# Patient Record
Sex: Male | Born: 1935
Health system: Southern US, Community
[De-identification: ages and names within clinical notes are randomized; demographics above are authoritative.]

## PROBLEM LIST (undated history)

## (undated) DIAGNOSIS — I4891 Unspecified atrial fibrillation: Secondary | ICD-10-CM

## (undated) DIAGNOSIS — I2510 Atherosclerotic heart disease of native coronary artery without angina pectoris: Secondary | ICD-10-CM

## (undated) DIAGNOSIS — I214 Non-ST elevation (NSTEMI) myocardial infarction: Secondary | ICD-10-CM

## (undated) DIAGNOSIS — I639 Cerebral infarction, unspecified: Secondary | ICD-10-CM

## (undated) DIAGNOSIS — E119 Type 2 diabetes mellitus without complications: Secondary | ICD-10-CM

## (undated) DIAGNOSIS — I10 Essential (primary) hypertension: Secondary | ICD-10-CM

## (undated) HISTORY — PX: CARDIAC SURGERY: SHX584

## (undated) HISTORY — PX: ROTATOR CUFF REPAIR: SHX139

---

## 2017-07-29 ENCOUNTER — Encounter (HOSPITAL_COMMUNITY): Payer: Self-pay | Admitting: Emergency Medicine

## 2017-07-29 ENCOUNTER — Ambulatory Visit (INDEPENDENT_AMBULATORY_CARE_PROVIDER_SITE_OTHER): Payer: Medicare PPO

## 2017-07-29 ENCOUNTER — Ambulatory Visit (HOSPITAL_COMMUNITY)
Admission: EM | Admit: 2017-07-29 | Discharge: 2017-07-29 | Disposition: A | Discharge status: Home / Self Care | Payer: Medicare PPO | Attending: Family Medicine | Admitting: Family Medicine

## 2017-07-29 DIAGNOSIS — R0789 Other chest pain: Secondary | ICD-10-CM | POA: Diagnosis not present

## 2017-07-29 DIAGNOSIS — R0781 Pleurodynia: Secondary | ICD-10-CM

## 2017-07-29 MED ORDER — IBUPROFEN 800 MG PO TABS
800.0000 mg | ORAL_TABLET | Freq: Once | ORAL | Status: AC
  Administered 2017-07-29: 800 mg via ORAL

## 2017-07-29 MED ORDER — NAPROXEN 375 MG PO TABS: 375.0000 mg | ORAL_TABLET | Freq: Two times a day (BID) | ORAL | 0 refills | Status: DC

## 2017-07-29 MED ORDER — IBUPROFEN 800 MG PO TABS
ORAL_TABLET | ORAL | Status: AC
  Filled 2017-07-29: qty 1

## 2017-07-29 NOTE — ED Provider Notes (Signed)
[facility]    CSN: [id number] Arrival date & time: 07/29/17  1235     History   Chief Complaint Chief Complaint  Patient presents with  . Flank Pain    HPI Conception ChancyRoscoe Bilotti is a 83 y.o. male.   Beckem presents with complaints of left lateral rib pain which started a few days ago. Worse with movement, laying down, or touching. Pain is constant but is exacerbated. Without shortness of breath, nausea, vomiting, urinary symptoms, fevers, injury, chest pain, pain to arm or jaw. Slight cough, non productive. Eating and drinking. States had similar episode 1 month ago and pain resolved spontaneously. States had a cardiac workup in the past but denies any other known medical history. Does not have a PCP. Has not taken any medications for symptoms, does not take any medications daily. Rates pain 10/10.    ROS per HPI.       History reviewed. No pertinent past medical history.  There are no active problems to display for this patient.   Past Surgical History:  Procedure Laterality Date  . CARDIAC SURGERY         Home Medications    Prior to Admission medications   Medication Sig Start Date End Date Taking? Authorizing Provider  naproxen (NAPROSYN) 375 MG tablet Take 1 tablet (375 mg total) by mouth 2 (two) times daily. 07/29/17   Georgetta HaberBurky, Poetry Cerro B, NP    Family History No family history on file.  Social History Social History   Tobacco Use  . Smoking status: Never Smoker  . Smokeless tobacco: Never Used  Substance Use Topics  . Alcohol use: No    Frequency: Never  . Drug use: Not on file     Allergies   Iodine   Review of Systems Review of Systems   Physical Exam Triage Vital Signs ED Triage Vitals [07/29/17 1424]  Enc Vitals Group     BP (!) 115/58     Pulse Rate 83     Resp 16     Temp 98.4 F (36.9 C)     Temp src      SpO2 100 %     Weight 260 lb (117.9 kg)     Height      Head Circumference      Peak Flow      Pain Score 10    Pain Loc      Pain Edu?      Excl. in GC?    No data found.  Updated Vital Signs BP (!) 115/58   Pulse 83   Temp 98.4 F (36.9 C)   Resp 16   Wt 260 lb (117.9 kg)   SpO2 100%   Visual Acuity Right Eye Distance:   Left Eye Distance:   Bilateral Distance:    Right Eye Near:   Left Eye Near:    Bilateral Near:     Physical Exam  Constitutional: He is oriented to person, place, and time. He appears well-developed and well-nourished.  Cardiovascular: Normal rate and regular rhythm.  Pulmonary/Chest: Effort normal and breath sounds normal. He exhibits tenderness. He exhibits no crepitus.  Left lateral and distal rib tenderness on palpation; non tender to abdomen, chest or back   Abdominal:    Neurological: He is alert and oriented to person, place, and time.  Skin: Skin is warm and dry.     UC Treatments / Results  Labs (all labs ordered are listed, but only abnormal results are displayed)  Labs Reviewed - No data to display  EKG  EKG Interpretation None       Radiology Dg Ribs Unilateral W/chest Left  Result Date: 07/29/2017 CLINICAL DATA:  Left lateral and distal rib pain for 3 days. No known injury. EXAM: LEFT RIBS AND CHEST - 3+ VIEW COMPARISON:  None. FINDINGS: No fracture or other bone lesions are seen involving the ribs. There is no evidence of pneumothorax or pleural effusion. Both lungs are clear. Heart size and mediastinal contours are within normal limits. IMPRESSION: Negative. Electronically Signed   By: Marnee Spring M.D.   On: 07/29/2017 14:57    Procedures Procedures (including critical care time)  Medications Ordered in UC Medications - No data to display   Initial Impression / Assessment and Plan / UC Course  I have reviewed the triage vital signs and the nursing notes.  Pertinent labs & imaging results that were available during my care of the patient were reviewed by me and considered in my medical decision making (see chart for  details).     Pain is specific and reproducible with direct palpation. Negative xray. Without any other symptoms of ACS at this time. Without any known cardiac risk factors. Non toxic in appearance. Vitals stable. Naproxen bid recommended. Ice application. Return precautions provided and discussed at length. Recommended establish with a pcp. Patient verbalized understanding and agreeable to plan.    Final Clinical Impressions(s) / UC Diagnoses   Final diagnoses:  Rib pain on left side    ED Discharge Orders        Ordered    naproxen (NAPROSYN) 375 MG tablet  2 times daily     07/29/17 1512       Controlled Substance Prescriptions [place] Controlled Substance Registry consulted? Not Applicable   Georgetta Haber, NP 07/29/17 1519    Georgetta Haber, NP 07/29/17 1520

## 2017-07-29 NOTE — ED Notes (Signed)
Per pt his daughter will be driving

## 2017-07-29 NOTE — Discharge Instructions (Signed)
Your xray is negative for broken ribs or pneumonia. Your pain is reproducible with touching and moving, likely musculoskeletal in nature. Will try antiinflammatories for pain control.  Take naproxen, twice a day with food. If you develop increased pain, palpitations, shortness of breath, nausea, pain to jaw or arm, or otherwise worsening please return to be seen or go to Er. Please establish with a primary care provider for a recheck and for annual physical.

## 2017-07-29 NOTE — ED Triage Notes (Signed)
PT reports left side pain. PT gestures to LUQ / left rib area. PT denies pain in chest. PT reports pain is sharp and shooting in quality. Denies urinary symptoms. No known injury.   Reports recent cough.

## 2019-06-11 DIAGNOSIS — M51369 Other intervertebral disc degeneration, lumbar region without mention of lumbar back pain or lower extremity pain: Secondary | ICD-10-CM | POA: Insufficient documentation

## 2019-09-18 DIAGNOSIS — Z550 Illiteracy and low-level literacy: Secondary | ICD-10-CM | POA: Insufficient documentation

## 2019-09-18 DIAGNOSIS — G8929 Other chronic pain: Secondary | ICD-10-CM | POA: Insufficient documentation

## 2019-09-18 DIAGNOSIS — H9190 Unspecified hearing loss, unspecified ear: Secondary | ICD-10-CM | POA: Insufficient documentation

## 2020-03-11 ENCOUNTER — Observation Stay (HOSPITAL_COMMUNITY)
Admission: EM | Admit: 2020-03-11 | Discharge: 2020-03-12 | Disposition: A | Discharge status: Home / Self Care | Payer: Medicare PPO | Attending: Family Medicine | Admitting: Family Medicine

## 2020-03-11 ENCOUNTER — Emergency Department (HOSPITAL_COMMUNITY): Payer: Medicare PPO

## 2020-03-11 ENCOUNTER — Other Ambulatory Visit: Payer: Self-pay

## 2020-03-11 ENCOUNTER — Encounter (HOSPITAL_COMMUNITY): Payer: Self-pay | Admitting: *Deleted

## 2020-03-11 DIAGNOSIS — I482 Chronic atrial fibrillation, unspecified: Secondary | ICD-10-CM | POA: Diagnosis present

## 2020-03-11 DIAGNOSIS — G8929 Other chronic pain: Secondary | ICD-10-CM | POA: Diagnosis not present

## 2020-03-11 DIAGNOSIS — I4892 Unspecified atrial flutter: Secondary | ICD-10-CM | POA: Diagnosis not present

## 2020-03-11 DIAGNOSIS — M545 Low back pain: Secondary | ICD-10-CM | POA: Diagnosis not present

## 2020-03-11 DIAGNOSIS — Z20822 Contact with and (suspected) exposure to covid-19: Secondary | ICD-10-CM | POA: Insufficient documentation

## 2020-03-11 DIAGNOSIS — R0600 Dyspnea, unspecified: Secondary | ICD-10-CM | POA: Insufficient documentation

## 2020-03-11 DIAGNOSIS — R0609 Other forms of dyspnea: Secondary | ICD-10-CM

## 2020-03-11 DIAGNOSIS — I4891 Unspecified atrial fibrillation: Principal | ICD-10-CM | POA: Insufficient documentation

## 2020-03-11 DIAGNOSIS — I2510 Atherosclerotic heart disease of native coronary artery without angina pectoris: Secondary | ICD-10-CM | POA: Diagnosis not present

## 2020-03-11 DIAGNOSIS — Z7982 Long term (current) use of aspirin: Secondary | ICD-10-CM | POA: Diagnosis not present

## 2020-03-11 DIAGNOSIS — D649 Anemia, unspecified: Secondary | ICD-10-CM | POA: Insufficient documentation

## 2020-03-11 DIAGNOSIS — I081 Rheumatic disorders of both mitral and tricuspid valves: Secondary | ICD-10-CM | POA: Diagnosis not present

## 2020-03-11 DIAGNOSIS — R079 Chest pain, unspecified: Secondary | ICD-10-CM | POA: Diagnosis present

## 2020-03-11 DIAGNOSIS — Z79899 Other long term (current) drug therapy: Secondary | ICD-10-CM | POA: Diagnosis not present

## 2020-03-11 HISTORY — DX: Atherosclerotic heart disease of native coronary artery without angina pectoris: I25.10

## 2020-03-11 HISTORY — DX: Unspecified atrial fibrillation: I48.91

## 2020-03-11 LAB — CBC
HCT: 27.9 % — ABNORMAL LOW (ref 39.0–52.0)
Hemoglobin: 8.2 g/dL — ABNORMAL LOW (ref 13.0–17.0)
MCH: 22.1 pg — ABNORMAL LOW (ref 26.0–34.0)
MCHC: 29.4 g/dL — ABNORMAL LOW (ref 30.0–36.0)
MCV: 75.2 fL — ABNORMAL LOW (ref 80.0–100.0)
Platelets: 245 10*3/uL (ref 150–400)
RBC: 3.71 MIL/uL — ABNORMAL LOW (ref 4.22–5.81)
RDW: 19.9 % — ABNORMAL HIGH (ref 11.5–15.5)
WBC: 3.3 10*3/uL — ABNORMAL LOW (ref 4.0–10.5)
nRBC: 0 % (ref 0.0–0.2)

## 2020-03-11 LAB — TROPONIN I (HIGH SENSITIVITY)
Troponin I (High Sensitivity): 77 ng/L — ABNORMAL HIGH (ref <18)
Troponin I (High Sensitivity): 74 ng/L — ABNORMAL HIGH (ref <18)

## 2020-03-11 LAB — PROTIME-INR
INR: 1.1 (ref 0.8–1.2)
Prothrombin Time: 13.4 seconds (ref 11.4–15.2)

## 2020-03-11 LAB — POC OCCULT BLOOD, ED: Fecal Occult Bld: NEGATIVE

## 2020-03-11 LAB — BASIC METABOLIC PANEL
Anion gap: 11 (ref 5–15)
BUN: 8 mg/dL (ref 8–23)
CO2: 24 mmol/L (ref 22–32)
Calcium: 8.6 mg/dL — ABNORMAL LOW (ref 8.9–10.3)
Chloride: 101 mmol/L (ref 98–111)
Creatinine, Ser: 1.04 mg/dL (ref 0.61–1.24)
GFR calc Af Amer: >60 mL/min (ref >60)
GFR calc non Af Amer: >60 mL/min (ref >60)
Glucose, Bld: 126 mg/dL — ABNORMAL HIGH (ref 70–99)
Potassium: 4.3 mmol/L (ref 3.5–5.1)
Sodium: 136 mmol/L (ref 135–145)

## 2020-03-11 LAB — MAGNESIUM: Magnesium: 1.9 mg/dL (ref 1.7–2.4)

## 2020-03-11 LAB — APTT: aPTT: 31 seconds (ref 24–36)

## 2020-03-11 LAB — BRAIN NATRIURETIC PEPTIDE: B Natriuretic Peptide: 155 pg/mL — ABNORMAL HIGH (ref 0.0–100.0)

## 2020-03-11 LAB — ABO/RH: ABO/RH(D): O POS

## 2020-03-11 LAB — PREPARE RBC (CROSSMATCH)

## 2020-03-11 LAB — SARS CORONAVIRUS 2 BY RT PCR (HOSPITAL ORDER, PERFORMED IN ~~LOC~~ HOSPITAL LAB): SARS Coronavirus 2: NEGATIVE

## 2020-03-11 MED ORDER — SODIUM CHLORIDE 0.9% FLUSH
3.0000 mL | Freq: Two times a day (BID) | INTRAVENOUS | Status: DC
  Administered 2020-03-11 – 2020-03-12 (×2): 3 mL via INTRAVENOUS

## 2020-03-11 MED ORDER — DILTIAZEM HCL 25 MG/5ML IV SOLN
10.0000 mg | Freq: Once | INTRAVENOUS | Status: AC
  Administered 2020-03-11: 10 mg via INTRAVENOUS
  Filled 2020-03-11: qty 5

## 2020-03-11 MED ORDER — IOHEXOL 350 MG/ML SOLN
100.0000 mL | Freq: Once | INTRAVENOUS | Status: AC | PRN
  Administered 2020-03-11: 100 mL via INTRAVENOUS

## 2020-03-11 MED ORDER — ASPIRIN EC 81 MG PO TBEC
81.0000 mg | DELAYED_RELEASE_TABLET | Freq: Every day | ORAL | Status: DC
  Administered 2020-03-12: 81 mg via ORAL
  Filled 2020-03-11: qty 1

## 2020-03-11 MED ORDER — SODIUM CHLORIDE 0.9 % IV SOLN: 250.0000 mL | INTRAVENOUS | Status: DC | PRN

## 2020-03-11 MED ORDER — METOPROLOL TARTRATE 25 MG PO TABS
25.0000 mg | ORAL_TABLET | Freq: Two times a day (BID) | ORAL | Status: DC
  Administered 2020-03-12 (×2): 25 mg via ORAL
  Filled 2020-03-11 (×2): qty 1

## 2020-03-11 MED ORDER — SODIUM CHLORIDE 0.9% IV SOLUTION: Freq: Once | INTRAVENOUS | Status: DC

## 2020-03-11 MED ORDER — ACETAMINOPHEN 325 MG PO TABS
650.0000 mg | ORAL_TABLET | ORAL | Status: DC | PRN
  Administered 2020-03-12: 650 mg via ORAL
  Filled 2020-03-11: qty 2

## 2020-03-11 MED ORDER — FAMOTIDINE 20 MG PO TABS
20.0000 mg | ORAL_TABLET | Freq: Every day | ORAL | Status: DC
  Administered 2020-03-11 – 2020-03-12 (×2): 20 mg via ORAL
  Filled 2020-03-11 (×2): qty 1

## 2020-03-11 MED ORDER — ENOXAPARIN SODIUM 40 MG/0.4ML ~~LOC~~ SOLN: 40.0000 mg | SUBCUTANEOUS | Status: DC

## 2020-03-11 MED ORDER — DILTIAZEM LOAD VIA INFUSION
20.0000 mg | Freq: Once | INTRAVENOUS | Status: DC
  Filled 2020-03-11: qty 20

## 2020-03-11 MED ORDER — GABAPENTIN 100 MG PO CAPS
200.0000 mg | ORAL_CAPSULE | Freq: Three times a day (TID) | ORAL | Status: DC
  Administered 2020-03-11 – 2020-03-12 (×2): 200 mg via ORAL
  Filled 2020-03-11 (×3): qty 2

## 2020-03-11 MED ORDER — DILTIAZEM HCL-DEXTROSE 125-5 MG/125ML-% IV SOLN (PREMIX)
5.0000 mg/h | INTRAVENOUS | Status: DC
  Filled 2020-03-11: qty 125

## 2020-03-11 MED ORDER — FUROSEMIDE 10 MG/ML IJ SOLN
40.0000 mg | Freq: Two times a day (BID) | INTRAMUSCULAR | Status: DC
  Administered 2020-03-11: 40 mg via INTRAVENOUS
  Filled 2020-03-11: qty 4

## 2020-03-11 MED ORDER — SODIUM CHLORIDE 0.9% FLUSH: 3.0000 mL | INTRAVENOUS | Status: DC | PRN

## 2020-03-11 NOTE — ED Provider Notes (Signed)
Patient was received as a handoff due to shift change from Irma Newness, PA-C she provided HPI, current work-up and likely disposition please see her note for for full detail.  In short patient presents with worsening dyspnea on exertion with substernal chest pain that started around 6 AM this morning.  Patient has significant medical history of atrial fib not on anticoags and iron deficient anemia.  Work-up reveals microcytic anemia which appears to be at baseline for patient, BMP without electrolyte abnormalities, no signs of AKI, negative Hemoccult, INR 1.1, prothrombin time 13.4, initial troponin 77, second troponin 74, EKG showing atrial flutter.  Covid negative, CT angios negative for PE but does show 4 vessel claudication.  Chest x-ray unremarkable for acute findings.  I suspect patient's increased dyspnea and chest pain secondary is A. fib with RVR patient will need further evaluation.  Pending cardio consult likely patient will be admitted for further observation. Physical Exam  BP (!) 149/84    Pulse 63    Temp 98.6 F (37 C) (Oral)    Resp 14    Ht 5\' 11"  (1.803 m)    Wt 117.9 kg    SpO2 96%    BMI 36.26 kg/m   Physical Exam Vitals and nursing note reviewed.  Constitutional:      General: He is not in acute distress.    Appearance: Normal appearance. He is not ill-appearing or diaphoretic.  HENT:     Head: Normocephalic and atraumatic.     Nose: No congestion or rhinorrhea.  Eyes:     General: No scleral icterus.       Right eye: No discharge.        Left eye: No discharge.     Conjunctiva/sclera: Conjunctivae normal.  Pulmonary:     Effort: Pulmonary effort is normal. No respiratory distress.     Breath sounds: Normal breath sounds. No wheezing.  Musculoskeletal:     Cervical back: Neck supple.     Right lower leg: No edema.     Left lower leg: No edema.  Skin:    General: Skin is warm and dry.     Coloration: Skin is not jaundiced or pale.  Neurological:     Mental  Status: He is alert and oriented to person, place, and time.  Psychiatric:        Mood and Affect: Mood normal.     ED Course/Procedures   Clinical Course as of Mar 12 1599  Thu Mar 11, 2020  1155 Atrial flutter with 2:1 A-V conduction Septal infarct , age undetermined Abnormal ECG  EKG 12-Lead [CG]  1159 Daughter tawanda 336 Mar 13, 2020   [CG]  1208 Hematology known low iron and scheduled to have iron infusions.  PCP sent to to cardiologist for dyspnea had appointment yesterday, they wanted to admit him and patient declined. Cardiologist hooked him on cardiac monitor and sent home to rest. Woke up this morning feeling worsening SOB. Called cardiologist and RN rec come to ER.  Dr 120[facility]. PCP Dr [facility] [place]. Cardiologist prescribed him rate controlling medicine just given yesterday.     [CG]  1226 Mildly prominent interstitial markings which may reflect bronchitic type lung changes versus mild edema.  DG Chest Port 1 View [CG]  1350 Performed hemoccult and HR back up in the 130s.  Decreased to low 100s after some rest. Patient very dyspnic with movement on bed for exam    [CG]  1351 Pending hemoccult   Hemoglobin(!): 8.2 [  CG]  1355 Atrial flutter Posterior infarct, old Non-specific ST-t changes   [CG]  1355 Fecal Occult Blood, POC: NEGATIVE [CG]  1547 Troponin I (High Sensitivity)(!): 74 [CG]    Clinical Course User Index [CG] Liberty Handy, PA-C    Procedures  MDM  I have personally reviewed all imaging, labs and have interpreted them.  Spoke with the on-call cardiologist Dr. Jacques Navy who agrees patient should be admitted to the medicine service for rate control and their team will consult on the patient.  Spoke with Dr. Esmond Harps of the medicine team who has agreed to accept the patient for further evaluation management.  She was provided HPI and current work-up.  Patient appears resting comfortably in bed, showing no acute signs stress, vital signs  remain reassuring.  Patient care will be transferred over to admitting team for further evaluation.      Carroll Sage, PA-C 03/11/20 1656    Gerhard Munch, MD 03/11/20 1729

## 2020-03-11 NOTE — H&P (Addendum)
Family Medicine Teaching [facility] Admission History and Physical Service Pager: [phone]  Patient name: Tristan Gardner Medical record number: [id number] Date of birth: Dec 03, 1934 Age: 85 y.o. Gender: male  Primary Care Provider: Patient, No Pcp Per Consultants: Cardiology Code Status: DNR Preferred Emergency Contact: Tristan Gardner [phone]  Chief Complaint: Chest Pain  Assessment and Plan: Tristan Gardner is a 85 y.o. male presenting with Chest Pain. PMH is significant for PAF, sciatica, chronic low back pain, obesity, iron deficiency anemia  Chest Pain   Atrial Flutter w/ h/o PAF   Dyspnea Patient presents with sharp pain in chest that radiates to right arm. VSS initially notable for HR to 128. Lab work up in ED significant for HS troponin that trended flat (77>75), ekg notable for intermittently a. Flutter and a. Fib, BNP with very mild elevation to 155. Chest X-Ray shows mildly prominent interstitial markings which may reflect bronchietic type lung changes versus mild edema. Due to chest pain and tachycardia, CTA obtained which had no e/o PE. Unclear how long patient has had a.fib/a.flutter, but patient's daughter indicates patient has had PAF for many years and patient has h/o cardiac catheterization. No records currently available as patient received most of his care in [place], [place]. Patient is only on ASA for PAF; however CHAD2S-VASC is at least 2-3 due to age and possible CHF. Unclear if patient has a known diagnosis of CHF, but he does use lasix 20 mg PO qd. Underlying etiology could include a component of CHF given pitting edema and fluid on chest X-Ray.  Patient has no infectious signs or symptoms. Patient has h/o iron deficiency anemia, with (hbg 8.6, MCV 75.2). He reports h/o colonoscopy many years ago, FOBT negative today with no h/o bloody stools or frank blood in urine. Patient referred to cardiology outpatient, who he saw yesterday and started metoprolol for rate control  of a. Flutter and recommended holter vs admission. HR improved to 60-70s with Diltiazem 10 mg bolus x2. Unlikely that ACS is cause of chest pain with flat troponins and ekg without ST changes; most likely related to demand ischemia in setting of a. Flutter with RVR and/or CHF. Cardiology consulted in the ED and recommends giving 1U pRBCs to assess for improvement in symptoms; may require ischemic work up if no improvement. - Admit to FPTS, cardiac telemetry, attending Dr. Deirdre Priest - Cardiology consulted, appreciate recommendations - f/u ECHO results - Continuous cardiac monitoring - Continuous pulse oximetry - AM BMP, CBC, Mag, & Phos - IV lasix 40 mg  - Can bolus with diltiazem as needed - Continue metoprolol  - Up with assistance - Heart healthy diet  Chronic Microcytic Anemia Patient reports long history of anemia and has been taking iron supplementation; received iron infusions in [place], [place] by daughter's report. Patient recently saw hematology at [facility] on 03/01/20, who noted iron deficiency anemia, recommended IV iron infusions and GI referral. Patient's baseline tends to be 9.5-9.6, but in setting of a. Flutter with RVR and significant cardiac history (report of cardiac cath), cardiology recommends transfusion of 1U pRBCs. Patient has h/o colonoscopy years ago, which was reportedly without concern. FOBT negative, no h/o melena, hematochezia. He may require an op GI work up. He also reports smoking 1/2 ppd of tobacco from age 85 to 73, or 17.5 pack-years. He denies frank blood in his urine. Will obtain UA. -Tranfuse 1 unti pRBCs - Monitor for symptomatic improvement - Repeat CBC in morning - UA  Leukopenia Notably patient has leukopenia to  3.3. Last WBC in care everywhere is 4 on 01/28/20. WNL. Platelets are WNL, so unlikely to be myeloproliferative disorder. Possibly due to age bone marrow is underperforming.  - Continue to monitor with CBC  Elevated BP BP elevated to 120-175/67-125  in ED. Most recently 120/81. Unclear baseline, though on care everywhere frequently SBP in 140s. Goal due to age <150/90. Not presently on any home medication.  - Continue to monitor   FEN/GI: Full Diet Prophylaxis: Lovenox  Disposition: cardiac telemetry  History of Present Illness:  Tristan Gardner is a 85 y.o. male presenting with chest pain and SOB. He has had these symptoms worsening over several months, but much worse since Tuesday. He woke up this morning and wasn't feeling well, he also had severe SOB. He was laying in bed when chest pain started, it was 10/10 in severity, stinging in nature and radiated to his right arm. He called his daughter due to that pain and she came over, upon seeing him, she called EMS. Yesterday he saw a cardiologist who recommended he be admitted yesterday after EKG revealed a fib and tachycardia, but patient did not want to be admitted and went home with holter monitor. Chest pain and SOB are better at rest, worse with any exertion, but can intermittently occur at rest. He had an episode of nausea and vomiting around 10 PM last night after eating [employer] fries for dinner. He has only had water today.   Patient denies weakness, runny nose, abdominal pain, dysuria, urinary frequency, constipation, and leg swelling.  Stopped smoking at age 85, started at age 85 yo 1/2 ppd. Does not drink EtoH, no illicit drug use.  Seen by Cardiologist yesterday.  Review Of Systems:   Review of Systems  Constitutional: Positive for activity change. Negative for chills, diaphoresis, fatigue and fever.  HENT: Negative for congestion and rhinorrhea.   Respiratory: Positive for cough, chest tightness and shortness of breath. Negative for wheezing.   Cardiovascular: Positive for chest pain and palpitations.  Gastrointestinal: Positive for nausea and vomiting. Negative for abdominal distention, abdominal pain and blood in stool.  Neurological: Negative for dizziness,  light-headedness, numbness and headaches.  All other systems reviewed and are negative.    There are no problems to display for this patient.   Past Medical History: Past Medical History:  Diagnosis Date   Coronary artery disease     Past Surgical History: Past Surgical History:  Procedure Laterality Date   CARDIAC SURGERY      Social History: Social History   Tobacco Use   Smoking status: Never Smoker   Smokeless tobacco: Never Used  Substance Use Topics   Alcohol use: No   Drug use: Not on file   Additional social history: lives with granddaughter Please also refer to relevant sections of EMR.  Family History: Diabetes Mother  Heart attack Father [id number] Diabetes Brother  Hypertension Brother  Heart disease Brother   Allergies and Medications: Allergies  Allergen Reactions   Iodine Hives    Pt states allergy to shellfish only    No current facility-administered medications on file prior to encounter.   Current Outpatient Medications on File Prior to Encounter  Medication Sig Dispense Refill   acetaminophen (TYLENOL) 500 MG tablet Take 1,000 mg by mouth every 6 (six) hours as needed for mild pain.     aspirin EC 81 MG tablet Take 81 mg by mouth daily. Swallow whole.     famotidine (PEPCID) 20 MG tablet Take 20 mg by  mouth daily.     gabapentin (NEURONTIN) 100 MG capsule Take 200 mg by mouth 3 (three) times daily.     ibuprofen (ADVIL) 200 MG tablet Take 600 mg by mouth every 6 (six) hours as needed for moderate pain.     naproxen (NAPROSYN) 375 MG tablet Take 1 tablet (375 mg total) by mouth 2 (two) times daily. (Patient not taking: Reported on 03/11/2020) 20 tablet 0    Objective: BP (!) 149/84    Pulse 63    Temp 98.6 F (37 C) (Oral)    Resp 14    Ht 5\' 11"  (1.803 m)    Wt 117.9 kg    SpO2 96%    BMI 36.26 kg/m  Exam:  Physical Exam Constitutional:      General: He is not in acute distress.    Appearance: He is well-developed.  HENT:      Head: Normocephalic and atraumatic.  Eyes:     Pupils: Pupils are equal, round, and reactive to light.  Cardiovascular:     Rate and Rhythm: Normal rate. Rhythm irregular.     Pulses:          Radial pulses are 2+ on the right side and 2+ on the left side.       Dorsalis pedis pulses are 2+ on the right side and 2+ on the left side.     Heart sounds: No murmur heard.  No systolic murmur is present.  No gallop.   Pulmonary:     Effort: Pulmonary effort is normal.     Breath sounds: Normal breath sounds.  Abdominal:     General: Bowel sounds are normal.     Palpations: Abdomen is soft.  Musculoskeletal:     Right lower leg: Edema present.     Left lower leg: Edema present.     Comments: 1+ pitting edema up to knees bilaterally  Skin:    General: Skin is warm.     Capillary Refill: Capillary refill takes less than 2 seconds.  Neurological:     Mental Status: He is alert.     Labs and Imaging: CBC BMET  Recent Labs  Lab 03/11/20 1214  WBC 3.3*  HGB 8.2*  HCT 27.9*  PLT 245   Recent Labs  Lab 03/11/20 1214  NA 136  K 4.3  CL 101  CO2 24  BUN 8  CREATININE 1.04  GLUCOSE 126*  CALCIUM 8.6*     EKG: A flutter with significant artifact and baseline wander  CT Angio Chest PE W and/or Wo Contrast  Result Date: 03/11/2020 CLINICAL DATA:  Shortness of breath, history of atrial fibrillation, suspected pulmonary embolus. EXAM: CT ANGIOGRAPHY CHEST WITH CONTRAST TECHNIQUE: Multidetector CT imaging of the chest was performed using the standard protocol during bolus administration of intravenous contrast. Multiplanar CT image reconstructions and MIPs were obtained to evaluate the vascular anatomy. CONTRAST:  05/11/2020 OMNIPAQUE IOHEXOL 350 MG/ML SOLN COMPARISON:  Chest x-ray 03/11/2020 FINDINGS: Cardiovascular: Satisfactory opacification of the pulmonary arteries to the segmental level. Unable to evaluate at the subsegmental level due to timing of intravenous contrast and respiratory  motion artifact. No evidence of pulmonary embolism. Normal heart size. No significant pericardial effusion. The thoracic aorta is normal in caliber. There is mild atherosclerotic plaque of the thoracic aorta. At least mild four-vessel coronary artery calcifications. Mediastinum/Nodes: Several prominent and minimally enlarged calcified and noncalcified mediastinal and right hilar lymph nodes are identified. No left hilar or axillary lymph nodes. Thyroid  gland, trachea, and esophagus demonstrate no significant findings. Lungs/Pleura: Expiratory phase of respiration with bilateral lower lobe subsegmental atelectasis. No focal consolidation. Limited evaluation for pulmonary micronodules due to respiratory motion artifact. Otherwise no pulmonary nodule identified. No pulmonary mass. No pleural effusion. No pneumothorax. Upper Abdomen: No acute abnormality. Musculoskeletal: Bilateral gynecomastia. No suspicious lytic or blastic osseous lesions. No acute displaced fracture. Multilevel moderate degenerative changes of the spine. Review of the MIP images confirms the above findings. IMPRESSION: No central or segmental pulmonary embolus. Limited evaluation of the subsegmental level due to timing of intravenous contrast and motion artifact. No acute intrathoracic abnormality. At least mild four-vessel coronary artery calcifications. Aortic Atherosclerosis (ICD10-I70.0). Electronically Signed   By: Tish Frederickson M.D.   On: 03/11/2020 14:57   DG Chest Port 1 View  Result Date: 03/11/2020 CLINICAL DATA:  Shortness of breath for 4-5 months EXAM: PORTABLE CHEST 1 VIEW COMPARISON:  07/29/2017 FINDINGS: The heart size is upper limits of normal. Atherosclerotic calcification of the aortic knob. Mildly prominent interstitial markings. No focal airspace consolidation, pleural effusion, or pneumothorax. The visualized skeletal structures are unremarkable. IMPRESSION: Mildly prominent interstitial markings which may reflect  bronchitic type lung changes versus mild edema. Electronically Signed   By: Duanne Guess D.O.   On: 03/11/2020 12:12   Jovita Kussmaul, MD 03/11/2020, 4:21 PM PGY-1, [facility] Family Medicine FPTS Intern pager: [phone], text pages welcome  FPTS Upper-Level Resident Addendum   I have independently interviewed and examined the patient. I have discussed the above with the original author and agree with their documentation. My edits for correction/addition/clarification are in pink. Please see also any attending notes.   Shirlean Mylar, M.D. PGY-2, [facility] Family Medicine 03/11/2020 10:19 PM  FPTS Service pager: [phone] (text pages welcome through [facility])

## 2020-03-11 NOTE — ED Provider Notes (Cosign Needed)
[facility] EMERGENCY DEPARTMENT Provider Note   CSN: [id number] Arrival date & time: 03/11/20  1100     History Chief Complaint  Patient presents with  . Chest Pain  . Atrial Fibrillation    Tristan Gardner is a 85 y.o. male with history of atrial fibrillation on aspirin presents to the ER for evaluation of shortness of breath for the last 4 to 5 months.  Exertional.  States walking more than 20 feet will make him really winded and he has to sit down.  This has gradually worsened over the last few months.  Also reports a dry forceful cough for several weeks.  He has chest pain when he coughs.  Has noticed he can lay flat and has to sit up when he tries to sleep.  This morning he went to the bathroom and when he came back to bed felt a "pinch" in the center part of his chest with associated right arm achiness.  This happened at around 6 AM.  He also had shortness of breath at that time.  He denies nausea, vomiting, diaphoresis, palpitations.  He called his daughter who called 911.  He was given nitroglycerin and 4 aspirin and states his chest pain is better but still there as well as his right arm pain.  Patient denies any lung disease, cigarette use.  Denies any history of CAD.  No history of blood clots, recent immobilization, recent surgeries, hormone therapy.  No hemoptysis.  Denies history of heart failure.Tested negative for COVID 2 days ago.  1255: I called patient's daughter who states patient has been reporting shortness of breath and cough for months.  His primary care doctor referred him to cardiology who he saw yesterday.  States cardiologist wanted to admit him but patient declined and sent him home with a cardiac monitor  He was prescribed additional medicines yesterday but he has not taken them, unknown of the names.  Patient is not on any blood thinners.  Daughter states that patient's iron levels have been low and he is scheduled for an iron infusion.  HPI      Past Medical History:  Diagnosis Date  . Coronary artery disease     There are no problems to display for this patient.   Past Surgical History:  Procedure Laterality Date  . CARDIAC SURGERY         No family history on file.  Social History   Tobacco Use  . Smoking status: Never Smoker  . Smokeless tobacco: Never Used  Substance Use Topics  . Alcohol use: No  . Drug use: Not on file    Home Medications Prior to Admission medications   Medication Sig Start Date End Date Taking? Authorizing Provider  acetaminophen (TYLENOL) 500 MG tablet Take 1,000 mg by mouth every 6 (six) hours as needed for mild pain.   Yes [provider]  aspirin EC 81 MG tablet Take 81 mg by mouth daily. Swallow whole.   Yes [provider]  famotidine (PEPCID) 20 MG tablet Take 20 mg by mouth daily.   Yes [provider]  gabapentin (NEURONTIN) 100 MG capsule Take 200 mg by mouth 3 (three) times daily.   Yes [provider]  ibuprofen (ADVIL) 200 MG tablet Take 600 mg by mouth every 6 (six) hours as needed for moderate pain.   Yes [provider]  naproxen (NAPROSYN) 375 MG tablet Take 1 tablet (375 mg total) by mouth 2 (two) times daily. Patient  not taking: Reported on 03/11/2020 07/29/17   Georgetta HaberBurky, Natalie B, NP    Allergies    Iodine  Review of Systems   Review of Systems  Respiratory: Positive for cough and shortness of breath.   Cardiovascular: Positive for chest pain.  Musculoskeletal: Positive for myalgias (right arm).  All other systems reviewed and are negative.   Physical Exam Updated Vital Signs BP (!) 149/84   Pulse 63   Temp 98.6 F (37 C) (Oral)   Resp 14   Ht 5\' 11"  (1.803 m)   Wt 117.9 kg   SpO2 96%   BMI 36.26 kg/m   Physical Exam Vitals and nursing note reviewed.  Constitutional:      General: He is not in acute distress.    Appearance: He is well-developed.     Comments: NAD.  HENT:     Head: Normocephalic and  atraumatic.     Right Ear: External ear normal.     Left Ear: External ear normal.     Nose: Nose normal.  Eyes:     General: No scleral icterus.    Conjunctiva/sclera: Conjunctivae normal.  Cardiovascular:     Rate and Rhythm: Normal rate. Rhythm irregular.     Heart sounds: Normal heart sounds. No murmur heard.      Comments: Irregular rhythm, heart rate in the 90s.  No pitting lower extremity edema.  No calf tenderness.  1+ radial and DP pulses bilaterally. Pulmonary:     Effort: Pulmonary effort is normal.     Breath sounds: Normal breath sounds. No wheezing.     Comments: Normal work of breathing.  No wheezing, crackles, mildly diminished air sounds in lower lobes Genitourinary:    Comments: DRE performed with RN at bedside. Stool is brown.  Musculoskeletal:        General: No deformity. Normal range of motion.     Cervical back: Normal range of motion and neck supple.  Skin:    General: Skin is warm and dry.     Capillary Refill: Capillary refill takes less than 2 seconds.  Neurological:     Mental Status: He is alert and oriented to person, place, and time.  Psychiatric:        Behavior: Behavior normal.        Thought Content: Thought content normal.        Judgment: Judgment normal.     ED Results / Procedures / Treatments   Labs (all labs ordered are listed, but only abnormal results are displayed) Labs Reviewed  BASIC METABOLIC PANEL - Abnormal; Notable for the following components:      Result Value   Glucose, Bld 126 (*)    Calcium 8.6 (*)    All other components within normal limits  CBC - Abnormal; Notable for the following components:   WBC 3.3 (*)    RBC 3.71 (*)    Hemoglobin 8.2 (*)    HCT 27.9 (*)    MCV 75.2 (*)    MCH 22.1 (*)    MCHC 29.4 (*)    RDW 19.9 (*)    All other components within normal limits  BRAIN NATRIURETIC PEPTIDE - Abnormal; Notable for the following components:   B Natriuretic Peptide 155.0 (*)    All other components  within normal limits  TROPONIN I (HIGH SENSITIVITY) - Abnormal; Notable for the following components:   Troponin I (High Sensitivity) 77 (*)    All other components within normal limits  SARS CORONAVIRUS  2 BY RT PCR [facility] ORDER, PERFORMED IN [facility])  APTT  PROTIME-INR  MAGNESIUM  POC OCCULT BLOOD, ED  TROPONIN I (HIGH SENSITIVITY)    EKG EKG Interpretation  Date/Time:  Thursday March 11 2020 11:58:45 EDT Ventricular Rate:  90 PR Interval:    QRS Duration: 100 QT Interval:  389 QTC Calculation: 408 R Axis:   16 Text Interpretation: Atrial flutter Posterior infarct, old Non-specific ST-t changes Confirmed by Raeford Razor [phone]) on 03/11/2020 1:52:07 PM   Radiology CT Angio Chest PE W and/or Wo Contrast  Result Date: 03/11/2020 CLINICAL DATA:  Shortness of breath, history of atrial fibrillation, suspected pulmonary embolus. EXAM: CT ANGIOGRAPHY CHEST WITH CONTRAST TECHNIQUE: Multidetector CT imaging of the chest was performed using the standard protocol during bolus administration of intravenous contrast. Multiplanar CT image reconstructions and MIPs were obtained to evaluate the vascular anatomy. CONTRAST:  OMNIPAQUE IOHEXOL 350 MG/ML SOLN COMPARISON:  Chest x-ray 03/11/2020 FINDINGS: Cardiovascular: Satisfactory opacification of the pulmonary arteries to the segmental level. Unable to evaluate at the subsegmental level due to timing of intravenous contrast and respiratory motion artifact. No evidence of pulmonary embolism. Normal heart size. No significant pericardial effusion. The thoracic aorta is normal in caliber. There is mild atherosclerotic plaque of the thoracic aorta. At least mild four-vessel coronary artery calcifications. Mediastinum/Nodes: Several prominent and minimally enlarged calcified and noncalcified mediastinal and right hilar lymph nodes are identified. No left hilar or axillary lymph nodes. Thyroid gland, trachea, and esophagus  demonstrate no significant findings. Lungs/Pleura: Expiratory phase of respiration with bilateral lower lobe subsegmental atelectasis. No focal consolidation. Limited evaluation for pulmonary micronodules due to respiratory motion artifact. Otherwise no pulmonary nodule identified. No pulmonary mass. No pleural effusion. No pneumothorax. Upper Abdomen: No acute abnormality. Musculoskeletal: Bilateral gynecomastia. No suspicious lytic or blastic osseous lesions. No acute displaced fracture. Multilevel moderate degenerative changes of the spine. Review of the MIP images confirms the above findings. IMPRESSION: No central or segmental pulmonary embolus. Limited evaluation of the subsegmental level due to timing of intravenous contrast and motion artifact. No acute intrathoracic abnormality. At least mild four-vessel coronary artery calcifications. Aortic Atherosclerosis (ICD10-I70.0). Electronically Signed   By: Tish Frederickson M.D.   On: 03/11/2020 14:57   DG Chest Port 1 View  Result Date: 03/11/2020 CLINICAL DATA:  Shortness of breath for 4-5 months EXAM: PORTABLE CHEST 1 VIEW COMPARISON:  07/29/2017 FINDINGS: The heart size is upper limits of normal. Atherosclerotic calcification of the aortic knob. Mildly prominent interstitial markings. No focal airspace consolidation, pleural effusion, or pneumothorax. The visualized skeletal structures are unremarkable. IMPRESSION: Mildly prominent interstitial markings which may reflect bronchitic type lung changes versus mild edema. Electronically Signed   By: Duanne Guess D.O.   On: 03/11/2020 12:12    Procedures Procedures (including critical care time)  Medications Ordered in ED Medications  diltiazem (CARDIZEM) injection 10 mg (10 mg Intravenous Given 03/11/20 1501)  iohexol (OMNIPAQUE) 350 MG/ML injection 100 mL (100 mLs Intravenous Contrast Given 03/11/20 1444)    ED Course  I have reviewed the triage vital signs and the nursing notes.  Pertinent labs  & imaging results that were available during my care of the patient were reviewed by me and considered in my medical decision making (see chart for details).  Clinical Course as of Mar 12 1515  Thu Mar 11, 2020  1155 Atrial flutter with 2:1 A-V conduction Septal infarct , age undetermined Abnormal ECG  EKG 12-Lead [CG]  1159 Daughter  tawanda 336 [id number]   [CG]  1208 Hematology known low iron and scheduled to have iron infusions.  PCP sent to to cardiologist for dyspnea had appointment yesterday, they wanted to admit him and patient declined. Cardiologist hooked him on cardiac monitor and sent home to rest. Woke up this morning feeling worsening SOB. Called cardiologist and RN rec come to ER.  Dr Boneta Lucks [facility]. PCP Dr Tinnie Gens at [facility]. Cardiologist prescribed him rate controlling medicine just given yesterday.     [CG]  1226 Mildly prominent interstitial markings which may reflect bronchitic type lung changes versus mild edema.  DG Chest Port 1 View [CG]  1350 Performed hemoccult and HR back up in the 130s.  Decreased to low 100s after some rest. Patient very dyspnic with movement on bed for exam    [CG]  1351 Pending hemoccult   Hemoglobin(!): 8.2 [CG]  1355 Atrial flutter Posterior infarct, old Non-specific ST-t changes   [CG]  1355 Fecal Occult Blood, POC: NEGATIVE [CG]    Clinical Course User Index [CG] Liberty Handy, PA-C   MDM Rules/Calculators/A&P                           I obtained additional history from triage, nursing notes and review of medical chart.  Previous medical records available, nursing notes reviewed to obtain more history and assist with MDM  No available medical records.  I called patient's daughter to obtain more information on his PMH. See above.   85 y.o. yo with reported history of atrial fibrillation on aspirin only here for exertional dyspnea for 4-5 months, dry cough, orthopnea.  Chest pain began 6 am today, constant substernal  "pinching" with radiation to right arm improved after aspirin and nitro but constant until now. Reportedly no OAC.    Arrives with HR 128, quickly improves with rest and HR in the 60-80 at rest.   Chief complain involves an extensive number of treatment options and is a complaint that carries with it a high risk of complications and morbidity and mortality.    CP, SOB differential diagnosis includes intermittent/symptomatic atrial fibrillation with RVR, ACS/NSTEMI, new onset CHF, PE.  He has had cough for months unknown COVID vaccination but reports negative test 2 days ago.  Infectious process less likely.   I ordered basic labs including troponin x 2, BNP, magnesium.  CXR, EKG.    I have personally visualized and interpreted ER diagnostic work up including labs and imaging.    ER work up reveals hemoglobin 8.2 HCT 27.9, patient denies known history of anemia but states he has low iron pending infusion. Hemoccult negative.  BNP 155. CXR with bronchitis changes vs pulmonary edema.  Trop 77, pending delta. Initial EKG with atrial flutter with HR 129. By the time I saw the patient his HR was in the 90s, repeat EKG with atrial flutter with HR 90.   I have ordered continuous cardiac monitoring, pulse ox.  Will plan for serial re-examinations. Close monitoring.   1400: Patient re-evaluated.  Becomes tachycardic with HR in the 130s with minimal movement (DRE), short of breath.  Continues to report mild substernal chest discomfort.   Will add CTA to rule out PE given known history of a-fib not on any anticoagulation, trop leak.   1440: Discussed with EDP Kohut, recommends small cardizem bolus no infusion since HR <100s at rest. Likely admission.  Hold off on anticoagulation.   1515: CTA negative for  PE. Delta trop pending. Will consult for admission for symptomatic atrial flutter, troponin elevation, cards consult.  Anemia may be contributing.   Final Clinical Impression(s) / ED Diagnoses Final  diagnoses:  Dyspnea on exertion    Rx / DC Orders ED Discharge Orders    None       Liberty Handy, PA-C 03/11/20 1516

## 2020-03-11 NOTE — Consult Note (Addendum)
Cardiology Consult:   Patient ID: Tristan Gardner MRN: [id number]; DOB: 01-15-1935   Admission date: 03/11/2020  Primary Care Provider: Patient, No Pcp Per [facility] Cardiologist: Dr. Boneta Lucks ([facility]) [facility] Electrophysiologist:      Patient Profile:   Tristan Gardner is a 85 y.o. male with a history of chronic atrial fibrillation on Aspirin (felt to be poor anticoagulation candidate due to anemia, iron deficiency anemia, chronic low back pain, and tobacco abuse who is being seen today for evaluation of atrial fibrillation with RVR and dyspnea on exertion at the request of Dr. Jeraldine Loots (Emergency Department).  History of Present Illness:   Tristan Gardner is a 85 year old male with the above history. He gets his general medicine care and cardiac care at [facility]. Patient reports he has a long history of anemia. He reportedly loss a pint a blood several years ago requiring blood transfusion. He states he had a colonoscopy at that time which did not show anything. He also report that he was seen by a Cardiologist (Dr. Doristine Bosworth at [facility]) around 10 year ago for evaluation of chest pain in the setting of anemia. Pain was reproducible with palpation at that time. Patient reports he had a cardiac catheterization at that time which "came up clean." He states he has had atrial fibrillation for "forever." First diagnosed several years ago while living in [place]. He recently moved here about 1 year ago. Per Chart Review in Care Everywhere, patient was recently seen at his PCP's office for annual visit in 02/2020 at which time he reported fatigue and shortness of breath. This was felt to be due to patient's chronic anemia. However, he was referred to Cardiology for atrial fibrillation. He was seen by Dr. Boneta Lucks at [facility] yesterday at which time he reported worsening dyspnea on exertion over the past several months. EKG in office showed atrial fibrillation with rates in  90's to 100's. Plan was to start Toprol-XL 25mg  daily and Lasix 20mg  twice daily and get an Echo and 48 hour Holter Monitor.  Patient presents to the ED today for further evaluation of these symptoms. Patient reports dyspnea on exertion especially when moving fast for at least the past year but he has noticed significant worsening of this recently. Recently he has even had to stop while eating because he gets so short of breath. He also reports recent orthopnea and PND which is new. No lower extremity edema. This morning he reported significant shortness of breath as well as slight central chest pain (pinpoint at sternum) that radiated down right arm. Chest pain reproducible with palpation and worse with inspiration. He notes palpitations but no significant. He also reports recent cough but no recent fevers or illness. He has been vaccinated for COVID. No abnormal bleeding including hematuria, hematochezia, or melena.  In the ED, patient tachycardic and mildly hypertensive. EKG showed atrial flutter with 2:1 AV conduction. High-sensitivity troponin minimally elevated at 77 and 74. BNP 155. Chest x-ray showed mildly prominent interstitial markings which may reflect bronchitic type lung changes vs mild edema. Chest CTA negative for PE but showed multivessel coronary artery calcifications. WBC 3.3, Hgb 8.2, Plts 245. Na 136, K 4.3, Glucose 126, BUN 8, Cr 1.04. COVID-19 negative.   Past Medical History:  Diagnosis Date  . Coronary artery disease     Past Surgical History:  Procedure Laterality Date  . CARDIAC SURGERY       Medications Prior to Admission: Prior to Admission medications  Medication Sig Start Date End Date Taking? Authorizing Provider  acetaminophen (TYLENOL) 500 MG tablet Take 1,000 mg by mouth every 6 (six) hours as needed for mild pain.   Yes [provider]  aspirin EC 81 MG tablet Take 81 mg by mouth daily. Swallow whole.   Yes [provider]  famotidine  (PEPCID) 20 MG tablet Take 20 mg by mouth daily.   Yes [provider]  gabapentin (NEURONTIN) 100 MG capsule Take 200 mg by mouth 3 (three) times daily.   Yes [provider]  ibuprofen (ADVIL) 200 MG tablet Take 600 mg by mouth every 6 (six) hours as needed for moderate pain.   Yes [provider]  naproxen (NAPROSYN) 375 MG tablet Take 1 tablet (375 mg total) by mouth 2 (two) times daily. Patient not taking: Reported on 03/11/2020 07/29/17   Georgetta Haber, NP     Allergies:    Allergies  Allergen Reactions  . Iodine Hives    Pt states allergy to shellfish only     Social History:   Social History   Socioeconomic History  . Marital status: Married    Spouse name: Not on file  . Number of children: Not on file  . Years of education: Not on file  . Highest education level: Not on file  Occupational History  . Not on file  Tobacco Use  . Smoking status: Never Smoker  . Smokeless tobacco: Never Used  Substance and Sexual Activity  . Alcohol use: No  . Drug use: Not on file  . Sexual activity: Not on file  Other Topics Concern  . Not on file  Social History Narrative  . Not on file   Social Determinants of Health   Financial Resource Strain:   . Difficulty of Paying Living Expenses: Not on file  Food Insecurity:   . Worried About Programme researcher, broadcasting/film/video in the Last Year: Not on file  . Ran Out of Food in the Last Year: Not on file  Transportation Needs:   . Lack of Transportation (Medical): Not on file  . Lack of Transportation (Non-Medical): Not on file  Physical Activity:   . Days of Exercise per Week: Not on file  . Minutes of Exercise per Session: Not on file  Stress:   . Feeling of Stress : Not on file  Social Connections:   . Frequency of Communication with Friends and Family: Not on file  . Frequency of Social Gatherings with Friends and Family: Not on file  . Attends Religious Services: Not on file  . Active Member of Clubs or  Organizations: Not on file  . Attends Banker Meetings: Not on file  . Marital Status: Not on file  Intimate Partner Violence:   . Fear of Current or Ex-Partner: Not on file  . Emotionally Abused: Not on file  . Physically Abused: Not on file  . Sexually Abused: Not on file    Family History:   The patient's family history is not on file.    ROS:  Please see the history of present illness.  All other ROS reviewed and negative.     Physical Exam/Data:   Vitals:   03/11/20 1217 03/11/20 1217 03/11/20 1300 03/11/20 1330  BP: (!) 159/74  (!) 175/84 (!) 149/84  Pulse: 75  63 63  Resp: (!) 22  12 14   Temp:      TempSrc:      SpO2: 98% 96% 92% 96%  Weight:      Height:       No intake or output data in the 24 hours ending 03/11/20 1614 Last 3 Weights 03/11/2020 07/29/2017  Weight (lbs) 260 lb 260 lb  Weight (kg) 117.935 kg 117.935 kg     Body mass index is 36.26 kg/m.  General: 85 y.o. male resting comfortably in no acute distress. HEENT: Normocephalic and atraumatic. Sclera clear. Neck: Supple. No carotid bruits. Unable to assess JVD due to mask placement. Heart: Tachycardic with irregular rhythm. Distinct S1 and S2. No murmurs, gallops, or rubs. Radial and distal pedal pulses 2+ and equal bilaterally. Lungs: No increased work of breathing. Clear to ausculation bilaterally. No significant crackles noted. Abdomen: Soft, non-distended, and non-tender to palpation. Bowel sounds present. MSK: Normal strength and tone for age. Extremities: Trace to 1+ pitting edema of bilateral lower extremities.  Skin: Warm and dry. Neuro: Alert and oriented x3. No focal deficits. Psych: Normal affect. Responds appropriately.     EKG:  The ECG that was done was personally reviewed and demonstrates atrial flutter, rate 129, with no significant ST/T changes.  Relevant CV Studies: N/A.  Laboratory Data:  High Sensitivity Troponin:   Recent Labs  Lab 03/11/20 1214  03/11/20 1412  TROPONINIHS 77* 74*      Chemistry Recent Labs  Lab 03/11/20 1214  NA 136  K 4.3  CL 101  CO2 24  GLUCOSE 126*  BUN 8  CREATININE 1.04  CALCIUM 8.6*  GFRNONAA >60  GFRAA >60  ANIONGAP 11    No results for input(s): PROT, ALBUMIN, AST, ALT, ALKPHOS, BILITOT in the last 168 hours. Hematology Recent Labs  Lab 03/11/20 1214  WBC 3.3*  RBC 3.71*  HGB 8.2*  HCT 27.9*  MCV 75.2*  MCH 22.1*  MCHC 29.4*  RDW 19.9*  PLT 245   BNP Recent Labs  Lab 03/11/20 1157  BNP 155.0*    DDimer No results for input(s): DDIMER in the last 168 hours.   Radiology/Studies:  CT Angio Chest PE W and/or Wo Contrast  Result Date: 03/11/2020 CLINICAL DATA:  Shortness of breath, history of atrial fibrillation, suspected pulmonary embolus. EXAM: CT ANGIOGRAPHY CHEST WITH CONTRAST TECHNIQUE: Multidetector CT imaging of the chest was performed using the standard protocol during bolus administration of intravenous contrast. Multiplanar CT image reconstructions and MIPs were obtained to evaluate the vascular anatomy. CONTRAST:  OMNIPAQUE IOHEXOL 350 MG/ML SOLN COMPARISON:  Chest x-ray 03/11/2020 FINDINGS: Cardiovascular: Satisfactory opacification of the pulmonary arteries to the segmental level. Unable to evaluate at the subsegmental level due to timing of intravenous contrast and respiratory motion artifact. No evidence of pulmonary embolism. Normal heart size. No significant pericardial effusion. The thoracic aorta is normal in caliber. There is mild atherosclerotic plaque of the thoracic aorta. At least mild four-vessel coronary artery calcifications. Mediastinum/Nodes: Several prominent and minimally enlarged calcified and noncalcified mediastinal and right hilar lymph nodes are identified. No left hilar or axillary lymph nodes. Thyroid gland, trachea, and esophagus demonstrate no significant findings. Lungs/Pleura: Expiratory phase of respiration with bilateral lower lobe  subsegmental atelectasis. No focal consolidation. Limited evaluation for pulmonary micronodules due to respiratory motion artifact. Otherwise no pulmonary nodule identified. No pulmonary mass. No pleural effusion. No pneumothorax. Upper Abdomen: No acute abnormality. Musculoskeletal: Bilateral gynecomastia. No suspicious lytic or blastic osseous lesions. No acute displaced fracture. Multilevel moderate degenerative changes of the spine. Review of the MIP images confirms the above findings. IMPRESSION: No central or segmental pulmonary embolus. Limited evaluation  of the subsegmental level due to timing of intravenous contrast and motion artifact. No acute intrathoracic abnormality. At least mild four-vessel coronary artery calcifications. Aortic Atherosclerosis (ICD10-I70.0). Electronically Signed   By: Tish FredericksonMorgane  Naveau M.D.   On: 03/11/2020 14:57   DG Chest Port 1 View  Result Date: 03/11/2020 CLINICAL DATA:  Shortness of breath for 4-5 months EXAM: PORTABLE CHEST 1 VIEW COMPARISON:  07/29/2017 FINDINGS: The heart size is upper limits of normal. Atherosclerotic calcification of the aortic knob. Mildly prominent interstitial markings. No focal airspace consolidation, pleural effusion, or pneumothorax. The visualized skeletal structures are unremarkable. IMPRESSION: Mildly prominent interstitial markings which may reflect bronchitic type lung changes versus mild edema. Electronically Signed   By: Duanne GuessNicholas  Plundo D.O.   On: 03/11/2020 12:12    New York Heart Association (NYHA) Functional Class NYHA Class III-IV  Assessment and Plan:   Dyspnea - Patient presenting with worsening dyspnea as well as orthopnea an PND over the last few days.  - BNP only minimally elevated at 155.  - No edema on chest x-ray or CT. - Will order Echo. - Start IV Lasix 40mg  twice daily. - Etiology not entirely clear. He does report orthopnea and PND so likely some component of CHF but BNP only minimally elevated. Chronic anemia  also likely contributing. Have recommended transfusion. If dyspnea does not improve with above measure, may need ischemic evaluation.  Atrial Flutter with RVR History of Atrial Fibrillation - Patient reportedly has long history of atrial fibrillation. However, he presented in atrial flutter with RVR. Currently rates in the 80 to 120's.  - Will start Lopressor 25mg  twice daily. - CHA2DS2-VASc = 2-3 (age x2 and possible CHF). Not on anticoagulation given significant chronic anemia. Continue Aspirin.  Chest Pain Demand Ischemia - Patient reports atypical chest pain today - reproducible and pleuritic. - High-sensitivity troponin minimally elevated and flat at 77 >> 74.  - No acute ischemic changes on EKG. - Will check Echo. - Chest pain and troponin elecation not consistent with ACS. Suspect demand ischemia due to atrial flutter with RVR and possible CHF.   Chronic Anemia - Patient reportedly has long history of anemia. Baseline seems to be in 8-9 range. Reports previously had a colonscopy about 5-10 years ago and this looked OK. - Hemoglobin 8.2 today. - Hemoccult negative.  - We have recommended transfusion to primary team. May also need to consult GI for repeat EGD/colonoscopy.   Will try to get outside records from [facility] in [place], [place].    For questions or updates, please contact [facility] Please consult [email or web address] for contact info under     Signed, Corrin ParkerCallie E Goodrich, PA-C  03/11/2020 4:14 PM  As above, patient seen and examined.  Briefly he is an 85 year old male with past medical history of atrial fibrillation/flutter, iron deficiency anemia for evaluation of atrial flutter and worsening dyspnea.  Patient is a somewhat difficult historian.  He states he has had atrial fibrillation for years but I do not have any records available.  He states he had a cardiac catheterization some years ago at [facility] and was told no blockages but again no  records are available.  He has been in this area for approximately 1 year.  He notes progressive dyspnea on exertion.  Also describes orthopnea, PND but denies pedal edema.  No palpitations or syncope.  He had chest pain earlier today that increased with inspiration and with palpation.  Otherwise no exertional chest pain.  He  was seen by a cardiologist at [facility] yesterday and was started on Toprol for rate control of atrial flutter, Lasix and an echocardiogram and Holter monitor were ordered.  However his dyspnea worsened and he presented for further evaluation.  Note he has a history of iron deficiency anemia.  He does state that he had a colonoscopy in the past.  He has not been on anticoagulation because of this.  CTA shows no pulmonary embolus but there was note of coronary calcification.  Creatinine 1.04, BNP 155, troponin 77 and 74, hemoglobin 8.2 with MCV 75.2.  Electrocardiogram shows atrial flutter with no ST changes.  1 dyspnea-etiology not entirely clear.  He does not appear to be markedly volume overloaded on examination but does describe dyspnea on exertion, orthopnea, PND.  We will diurese with Lasix 40 mg IV twice daily to see if his symptoms improve.  Note his BNP is not markedly elevated.  We will arrange an echocardiogram to assess LV function.  He is significantly anemic which could also be contributing to his dyspnea and we have recommended transfusion to the primary service.  If he does not improve with above measures may need nuclear study to screen for ischemia.  His atrial arrhythmias apparently are chronic.  We will try and obtain outside records.  2 atrial flutter-based on patient's history he states this is chronic.  We will treat with metoprolol 25 mg twice daily for rate control.  Follow on telemetry to make sure rate is controlled.  He has not been on anticoagulation due to iron deficiency anemia and presumed GI blood loss.  We will continue off for now.  Again we will need to  review outside records.  3 iron deficiency anemia-Will need records concerning previous GI evaluation.  Would recommend transfusing 2 units of packed red blood cells as this certainly could be contributing to his dyspnea.  Check iron studies.  Likely needs to be on iron chronically.  4 minimally elevated troponin-no clear trend and no symptoms of acute coronary syndrome.  Olga Millers

## 2020-03-11 NOTE — ED Notes (Signed)
Attempted report x1. 

## 2020-03-11 NOTE — ED Triage Notes (Signed)
Patient presents to ED  Via [facility] c/o SOB for over a month was seen by his heart doctor yest and they wanted to admit him however he states he didn't feel that bad and didn 't want to be admitted. States sob increased during the night. C/o right sided chest pain with radiation to right arm. States chest pain isn worse with inspiration. C/o productive cough denies pedal edema. Alert oriented.

## 2020-03-12 ENCOUNTER — Inpatient Hospital Stay (HOSPITAL_BASED_OUTPATIENT_CLINIC_OR_DEPARTMENT_OTHER): Payer: Medicare PPO

## 2020-03-12 ENCOUNTER — Encounter (HOSPITAL_COMMUNITY): Payer: Self-pay | Admitting: Family Medicine

## 2020-03-12 DIAGNOSIS — I4891 Unspecified atrial fibrillation: Secondary | ICD-10-CM | POA: Diagnosis not present

## 2020-03-12 DIAGNOSIS — I4892 Unspecified atrial flutter: Secondary | ICD-10-CM | POA: Diagnosis not present

## 2020-03-12 DIAGNOSIS — I340 Nonrheumatic mitral (valve) insufficiency: Secondary | ICD-10-CM

## 2020-03-12 DIAGNOSIS — I361 Nonrheumatic tricuspid (valve) insufficiency: Secondary | ICD-10-CM | POA: Diagnosis not present

## 2020-03-12 LAB — BASIC METABOLIC PANEL
Anion gap: 9 (ref 5–15)
BUN: 9 mg/dL (ref 8–23)
CO2: 29 mmol/L (ref 22–32)
Calcium: 9.2 mg/dL (ref 8.9–10.3)
Chloride: 98 mmol/L (ref 98–111)
Creatinine, Ser: 1.04 mg/dL (ref 0.61–1.24)
GFR calc Af Amer: >60 mL/min (ref >60)
GFR calc non Af Amer: >60 mL/min (ref >60)
Glucose, Bld: 126 mg/dL — ABNORMAL HIGH (ref 70–99)
Potassium: 4.3 mmol/L (ref 3.5–5.1)
Sodium: 136 mmol/L (ref 135–145)

## 2020-03-12 LAB — BPAM RBC
Blood Product Expiration Date: 202110092359
ISSUE DATE / TIME: 202109092133
Unit Type and Rh: 5100

## 2020-03-12 LAB — TYPE AND SCREEN
ABO/RH(D): O POS
Antibody Screen: NEGATIVE
Unit division: 0

## 2020-03-12 LAB — CBC
HCT: 31.2 % — ABNORMAL LOW (ref 39.0–52.0)
Hemoglobin: 9.6 g/dL — ABNORMAL LOW (ref 13.0–17.0)
MCH: 23.1 pg — ABNORMAL LOW (ref 26.0–34.0)
MCHC: 30.8 g/dL (ref 30.0–36.0)
MCV: 75 fL — ABNORMAL LOW (ref 80.0–100.0)
Platelets: 236 10*3/uL (ref 150–400)
RBC: 4.16 MIL/uL — ABNORMAL LOW (ref 4.22–5.81)
RDW: 19.7 % — ABNORMAL HIGH (ref 11.5–15.5)
WBC: 4.5 10*3/uL (ref 4.0–10.5)
nRBC: 0.4 % — ABNORMAL HIGH (ref 0.0–0.2)

## 2020-03-12 LAB — ECHOCARDIOGRAM COMPLETE
Area-P 1/2: 3.6 cm2
Height: 71.5 in
S' Lateral: 3.5 cm
Weight: 4339.2 oz

## 2020-03-12 LAB — MAGNESIUM: Magnesium: 1.9 mg/dL (ref 1.7–2.4)

## 2020-03-12 LAB — PHOSPHORUS: Phosphorus: 3.8 mg/dL (ref 2.5–4.6)

## 2020-03-12 MED ORDER — ALUM HYDROXIDE-MAG TRISILICATE 80-20 MG PO CHEW
1.0000 | CHEWABLE_TABLET | Freq: Three times a day (TID) | ORAL | Status: DC | PRN
  Filled 2020-03-12: qty 1

## 2020-03-12 MED ORDER — FUROSEMIDE 40 MG PO TABS: 40.0000 mg | ORAL_TABLET | Freq: Every day | ORAL | 0 refills | Status: DC

## 2020-03-12 MED ORDER — PANTOPRAZOLE SODIUM 40 MG PO TBEC
40.0000 mg | DELAYED_RELEASE_TABLET | Freq: Every day | ORAL | Status: DC
  Administered 2020-03-12: 40 mg via ORAL
  Filled 2020-03-12: qty 1

## 2020-03-12 MED ORDER — CALCIUM CARBONATE ANTACID 500 MG PO CHEW: 1.0000 | CHEWABLE_TABLET | Freq: Two times a day (BID) | ORAL | Status: DC | PRN

## 2020-03-12 MED ORDER — SODIUM CHLORIDE 0.9 % IV SOLN
510.0000 mg | Freq: Once | INTRAVENOUS | Status: AC
  Administered 2020-03-12: 510 mg via INTRAVENOUS
  Filled 2020-03-12: qty 17

## 2020-03-12 MED ORDER — METOPROLOL TARTRATE 25 MG PO TABS
25.0000 mg | ORAL_TABLET | Freq: Two times a day (BID) | ORAL | 0 refills | Status: DC
Start: 2020-03-12 — End: 2020-05-18

## 2020-03-12 MED ORDER — CALCIUM CARBONATE ANTACID 500 MG PO CHEW
1.0000 | CHEWABLE_TABLET | Freq: Two times a day (BID) | ORAL | Status: DC | PRN
  Administered 2020-03-12 (×2): 200 mg via ORAL
  Filled 2020-03-12 (×2): qty 1

## 2020-03-12 MED ORDER — FAMOTIDINE 20 MG PO TABS
20.0000 mg | ORAL_TABLET | Freq: Two times a day (BID) | ORAL | 0 refills | Status: DC
Start: 2020-03-12 — End: 2020-08-06

## 2020-03-12 MED ORDER — FUROSEMIDE 40 MG PO TABS
40.0000 mg | ORAL_TABLET | Freq: Every day | ORAL | Status: DC
  Administered 2020-03-12: 40 mg via ORAL
  Filled 2020-03-12: qty 1

## 2020-03-12 NOTE — Hospital Course (Signed)
Tristan Gardner is an 85 y/o male who presented with chest pain and dyspnea. PMH is significant for paroxysmal a-fib, iron deficiency anemia, sciatica, chronic low back pain, and obesity.  Paroxysmal A-fib   A-flutter   Chronic microcytic anemia Patient reportedly with long hx of anemia with baseline Hgb 8-9.

## 2020-03-12 NOTE — Evaluation (Signed)
Physical Therapy Evaluation Patient Details Name: Tristan Gardner MRN: [id number] DOB: 10/22/1934 Today's Date: 03/12/2020   History of Present Illness  85 y/o male presenting with chest pain and dyspnea. PMH is significant for paroxysmal a-fib, iron deficiency anemia, sciatica, chronic low back pain, and obesity. Admitted 03/11/20 for treatment of chest pain, and A-flutter. s/p 1 u pRBC  Clinical Impression  PTA pt living with granddaughter and her two children, in multistory home with 2 steps to enter. Pt bed and bath are on the first floor. Pt reports limitation with sciatic pain however is still independent with short distance ambulation, uses scooter in stores. Pt is independent in modified bathing and dressing and simple iADLs. Pt is currently limited by R LE pain and decreased strength and endurance. Pt is mod I for bed mobility, supervision for transfers and min guard for short distance ambulation with SPC. PT recommending RW and HHPT at discharge to assist in safe mobility in his home environment. PT will continue to follow acutely.     Follow Up Recommendations Home health PT;Supervision for mobility/OOB    Equipment Recommendations  Rolling walker with 5" wheels       Precautions / Restrictions Restrictions Weight Bearing Restrictions: No      Mobility  Bed Mobility Overal bed mobility: Modified Independent             General bed mobility comments: use of bed rail to pull to EoB  Transfers Overall transfer level: Needs assistance Equipment used: None;Straight cane Transfers: Sit to/from Stand;Stand Pivot Transfers Sit to Stand: Modified independent (Device/Increase time) Stand pivot transfers: Supervision       General transfer comment: mod I to power up from bed and supervision to pivot to chair, once in chair able to use cane for power up with decreased weightbearing on R LE  Ambulation/Gait Ambulation/Gait assistance: Min guard Gait Distance (Feet): 45  Feet Assistive device: Straight cane Gait Pattern/deviations: Step-to pattern;Decreased step length - right;Decreased step length - left;Decreased weight shift to right;Decreased stance time - right;Trunk flexed;Antalgic Gait velocity: slowed   General Gait Details: min guard for safety, increased pain and decreased motor ability in R LE with distance       Balance Overall balance assessment: Needs assistance Sitting-balance support: Feet supported;No upper extremity supported Sitting balance-Leahy Scale: Normal     Standing balance support: No upper extremity supported;During functional activity;Single extremity supported Standing balance-Leahy Scale: Fair Standing balance comment: unable to withstand any perturbance                             Pertinent Vitals/Pain Pain Assessment: 0-10 Pain Score: 10-Worst pain ever Pain Location: R LE Pain Descriptors / Indicators: Shooting;Sharp Pain Intervention(s): Limited activity within patient's tolerance;Monitored during session;Repositioned    Home Living Family/patient expects to be discharged to:: Private residence Living Arrangements: Spouse/significant other;Other relatives (granddaughter and her 2 kids) Available Help at Discharge: Family;Available 24 hours/day Type of Home: House Home Access: Stairs to enter   [employer] of Steps: 2 Home Layout: Two level;Able to live on main level with bedroom/bathroom Home Equipment: Grab bars - tub/shower;Hand held shower head;Tub bench;Walker - 4 wheels;Cane - single point      Prior Function Level of Independence: Needs assistance   Gait / Transfers Assistance Needed: limited community ambulation with cane or Rollator , uses scooter in stores               Extremity/Trunk Assessment  Upper Extremity Assessment Upper Extremity Assessment: Defer to OT evaluation LUE Deficits / Details: new onset of L shoulder pain     Lower Extremity  Assessment Lower Extremity Assessment: RLE deficits/detail RLE Deficits / Details: sciatica and occasional jerking, ROM WFL, strength grossly 3+/5, describes increasing nerve pain and decreased motor ability with increased use  RLE: Unable to fully assess due to pain RLE Sensation: decreased light touch (entire L LE ) RLE Coordination: decreased gross motor;decreased fine motor       Communication   Communication: HOH  Cognition Arousal/Alertness: Awake/alert Behavior During Therapy: WFL for tasks assessed/performed Overall Cognitive Status: Within Functional Limits for tasks assessed                                        General Comments General comments (skin integrity, edema, etc.): VSS on RA         Assessment/Plan    PT Assessment Patient needs continued PT services  PT Problem List Decreased strength;Decreased activity tolerance;Decreased mobility;Decreased balance;Pain       PT Treatment Interventions DME instruction;Gait training;Functional mobility training;Therapeutic activities;Therapeutic exercise;Balance training;Cognitive remediation;Patient/family education    PT Goals (Current goals can be found in the Care Plan section)  Acute Rehab PT Goals Patient Stated Goal: have less sciatic pain  PT Goal Formulation: With patient Time For Goal Achievement: 03/26/20 Potential to Achieve Goals: Fair    Frequency Min 3X/week    AM-PAC PT "6 Clicks" Mobility  Outcome Measure Help needed turning from your back to your side while in a flat bed without using bedrails?: None Help needed moving from lying on your back to sitting on the side of a flat bed without using bedrails?: None Help needed moving to and from a bed to a chair (including a wheelchair)?: None Help needed standing up from a chair using your arms (e.g., wheelchair or bedside chair)?: None Help needed to walk in hospital room?: A Little Help needed climbing 3-5 steps with a railing? : A  Little 6 Click Score: 22    End of Session Equipment Utilized During Treatment: Gait belt Activity Tolerance: Patient limited by pain Patient left: in chair;Other (comment) (OT in room ) Nurse Communication: Mobility status PT Visit Diagnosis: Other abnormalities of gait and mobility (R26.89);Muscle weakness (generalized) (M62.81);Other symptoms and signs involving the nervous system (R29.898);Difficulty in walking, not elsewhere classified (R26.2)    Time: 4098-1191 PT Time Calculation (min) (ACUTE ONLY): 24 min   Charges:   PT Evaluation $PT Eval Moderate Complexity: 1 Mod PT Treatments $Gait Training: 8-22 mins        Jackie Littlejohn B. Beverely Risen PT, DPT Acute Rehabilitation Services Pager [phone] Office [phone]   Elon Alas Fleet 03/12/2020, 1:03 PM

## 2020-03-12 NOTE — Care Management CC44 (Signed)
Condition Code 44 Documentation Completed  Patient Details  Name: Mathias Bogacki MRN: [id number] Date of Birth: [birth year]   Condition Code 44 given:  Yes Patient signature on Condition Code 44 notice:  Yes Documentation of 2 MD's agreement:  Yes Code 44 added to claim:  Yes    Gala Lewandowsky, RN 03/12/2020, 5:03 PM

## 2020-03-12 NOTE — Progress Notes (Signed)
We have obtained records from [facility] health system.  In June 2020 the patient was in sinus rhythm.  He was noted to be anemic at that time and had colonoscopy and EGD that were normal by report.  An echocardiogram at that time showed normal LV function.  Patient has not been anticoagulated for paroxysmal atrial fibrillation/flutter as apparently there has been issues with compliance in the past.  We will await follow-up echocardiogram and anemia evaluation here.  Once complete would favor a trial of apixaban for 4 weeks followed by elective cardioversion.  His atrial flutter may be contributing to his dyspnea and hopefully reestablishing sinus rhythm would improve his symptoms. Olga Millers

## 2020-03-12 NOTE — Discharge Instructions (Signed)
You were admitted to [facility] due to worsening shortness of breath with exertion.  We have completed a very thorough work-up to evaluate the cause which has all been normal.  This does not appear to be related to your heart.  This may be due to your atrial fibrillation and your anemia.  We have started you on a medicine to help your heart rate called metoprolol.  It will be important for you to take this every day as prescribed.  The cardiologist has also recommended you start a blood thinner called Eliquis. Please be sure to follow up with your cardiologist regarding this medication. And if when you start this medication you should closely watch for any signs of bleeding such as dark stools, red stools, blood in your urine, or blood in your vomit you need to be seen in the emergency department immediately.   Changes to medications: 1. Please take Metoprolol 25mg  twice a day every day. You should take your next dose tonight. 2. Please take Lasix 40mg : 1 tablet daily.  3. The cardiologist has recommended you take Eliquis. Please be sure to follow up with your cardiologist regarding starting this medication.   Follow up Recommendations: 1. Please follow-up with your cardiologist on 03/18/2020 as scheduled for further evaluation. 2. Please follow up with your hematologist as scheduled. 3. We also recommend that you follow-up with GI for further evaluation of your anemia as this is abnormal and concerning.  It is important that this is further evaluated.  Thank you for allowing to take care of you. [facility]

## 2020-03-12 NOTE — Care Management (Signed)
1430 03-12-20 Benefits check submitted for Eliquis. Case Manager will follow for cost. Graves-Bigelow, Lamar Laundry, RN,BSN Case Manager

## 2020-03-12 NOTE — Progress Notes (Signed)
°  Echocardiogram 2D Echocardiogram has been performed.  Tristan Gardner 03/12/2020, 10:03 AM

## 2020-03-12 NOTE — Progress Notes (Signed)
Progress Note  Patient Name: Tristan Gardner Date of Encounter: 03/12/2020  [facility] Cardiologist: Dr Darci Current  Subjective   No CP; mild improvement in dyspnea  Inpatient Medications    Scheduled Meds: . sodium chloride   Intravenous Once  . aspirin EC  81 mg Oral Daily  . enoxaparin (LOVENOX) injection  40 mg Subcutaneous Q24H  . famotidine  20 mg Oral Daily  . gabapentin  200 mg Oral TID  . metoprolol tartrate  25 mg Oral BID  . sodium chloride flush  3 mL Intravenous Q12H   Continuous Infusions: . sodium chloride     PRN Meds: sodium chloride, acetaminophen, calcium carbonate, sodium chloride flush   Vital Signs    Vitals:   03/11/20 2144 03/11/20 2200 03/12/20 0114 03/12/20 0435  BP: 120/81 125/82 (!) 160/74 138/75  Pulse: 96 65  63  Resp:  20 20 18   Temp: 97.9 F (36.6 C) 98.2 F (36.8 C) 98.6 F (37 C) 98 F (36.7 C)  TempSrc: Oral Oral Oral Oral  SpO2: 99% 96% 92% 94%  Weight:    123 kg  Height:        Intake/Output Summary (Last 24 hours) at 03/12/2020 0813 Last data filed at 03/12/2020 0441 Gross per 24 hour  Intake 1370 ml  Output 3800 ml  Net -2430 ml   Last 3 Weights 03/12/2020 03/11/2020 03/11/2020  Weight (lbs) 271 lb 3.2 oz 271 lb 13.2 oz 260 lb  Weight (kg) 123.016 kg 123.3 kg 117.935 kg      Telemetry    Atrial flutter - Personally Reviewed   Physical Exam   GEN: No acute distress.   Neck: No JVD Cardiac: irregular  Respiratory: Clear to auscultation bilaterally. GI: Soft, nontender, non-distended  MS: No edema Neuro:  Nonfocal  Psych: Normal affect   Labs    High Sensitivity Troponin:   Recent Labs  Lab 03/11/20 1214 03/11/20 1412  TROPONINIHS 77* 74*      Chemistry Recent Labs  Lab 03/11/20 1214 03/12/20 0422  NA 136 136  K 4.3 4.3  CL 101 98  CO2 24 29  GLUCOSE 126* 126*  BUN 8 9  CREATININE 1.04 1.04  CALCIUM 8.6* 9.2  GFRNONAA >60 >60  GFRAA >60 >60  ANIONGAP 11 9     Hematology Recent  Labs  Lab 03/11/20 1214 03/12/20 0422  WBC 3.3* 4.5  RBC 3.71* 4.16*  HGB 8.2* 9.6*  HCT 27.9* 31.2*  MCV 75.2* 75.0*  MCH 22.1* 23.1*  MCHC 29.4* 30.8  RDW 19.9* 19.7*  PLT 245 236    BNP Recent Labs  Lab 03/11/20 1157  BNP 155.0*     Radiology    CT Angio Chest PE W and/or Wo Contrast  Result Date: 03/11/2020 CLINICAL DATA:  Shortness of breath, history of atrial fibrillation, suspected pulmonary embolus. EXAM: CT ANGIOGRAPHY CHEST WITH CONTRAST TECHNIQUE: Multidetector CT imaging of the chest was performed using the standard protocol during bolus administration of intravenous contrast. Multiplanar CT image reconstructions and MIPs were obtained to evaluate the vascular anatomy. CONTRAST:  05/11/2020 OMNIPAQUE IOHEXOL 350 MG/ML SOLN COMPARISON:  Chest x-ray 03/11/2020 FINDINGS: Cardiovascular: Satisfactory opacification of the pulmonary arteries to the segmental level. Unable to evaluate at the subsegmental level due to timing of intravenous contrast and respiratory motion artifact. No evidence of pulmonary embolism. Normal heart size. No significant pericardial effusion. The thoracic aorta is normal in caliber. There is mild atherosclerotic plaque of the thoracic aorta. At  least mild four-vessel coronary artery calcifications. Mediastinum/Nodes: Several prominent and minimally enlarged calcified and noncalcified mediastinal and right hilar lymph nodes are identified. No left hilar or axillary lymph nodes. Thyroid gland, trachea, and esophagus demonstrate no significant findings. Lungs/Pleura: Expiratory phase of respiration with bilateral lower lobe subsegmental atelectasis. No focal consolidation. Limited evaluation for pulmonary micronodules due to respiratory motion artifact. Otherwise no pulmonary nodule identified. No pulmonary mass. No pleural effusion. No pneumothorax. Upper Abdomen: No acute abnormality. Musculoskeletal: Bilateral gynecomastia. No suspicious lytic or blastic osseous  lesions. No acute displaced fracture. Multilevel moderate degenerative changes of the spine. Review of the MIP images confirms the above findings. IMPRESSION: No central or segmental pulmonary embolus. Limited evaluation of the subsegmental level due to timing of intravenous contrast and motion artifact. No acute intrathoracic abnormality. At least mild four-vessel coronary artery calcifications. Aortic Atherosclerosis (ICD10-I70.0). Electronically Signed   By: Tish Frederickson M.D.   On: 03/11/2020 14:57   DG Chest Port 1 View  Result Date: 03/11/2020 CLINICAL DATA:  Shortness of breath for 4-5 months EXAM: PORTABLE CHEST 1 VIEW COMPARISON:  07/29/2017 FINDINGS: The heart size is upper limits of normal. Atherosclerotic calcification of the aortic knob. Mildly prominent interstitial markings. No focal airspace consolidation, pleural effusion, or pneumothorax. The visualized skeletal structures are unremarkable. IMPRESSION: Mildly prominent interstitial markings which may reflect bronchitic type lung changes versus mild edema. Electronically Signed   By: Duanne Guess D.O.   On: 03/11/2020 12:12    Patient Profile     85 year old male with past medical history of atrial fibrillation/flutter, iron deficiency anemia for evaluation of atrial flutter and worsening dyspnea.  CTA shows no pulmonary embolus but there was note of coronary calcification.    Assessment & Plan    1 dyspnea-etiology remains unclear.  His symptoms are mildly improved this morning after transfusion and diuresis.  He is not significantly volume overloaded on examination.  We will decrease Lasix to 40 mg by mouth daily.  Await echocardiogram to assess LV function.  If symptoms persist despite diuresis would be worthwhile to risk stratify with stress nuclear study as an outpatient.  2 atrial flutter-patient states this is chronic though we have no records for verification.  We will await records from [facility].  His heart rate is  controlled.  We will continue metoprolol at present dose.  He has significant microcytic anemia.  He will need GI evaluation to exclude GI source of blood loss.  If unremarkable would favor treating with apixaban in the future to see if he tolerates.  3 microcytic anemia-as above likely needs GI evaluation.  4 minimally elevated troponin-no clear trend and not consistent with acute coronary syndrome.  For questions or updates, please contact [facility] Please consult [email or web address] for contact info under        Signed, Olga Millers, MD  03/12/2020, 8:13 AM

## 2020-03-12 NOTE — Progress Notes (Signed)
Pt having increased feeling of SOB- O2 sat- 98% He requested to have O2 for comfort- placed on 2L

## 2020-03-12 NOTE — Progress Notes (Signed)
SATURATION QUALIFICATIONS: (This note is used to comply with regulatory documentation for home oxygen)  Patient Saturations on Room Air at Rest = 97%  Patient Saturations on Room Air while Ambulating = 90%  Patient Saturations on Liters of oxygen while Ambulating = N/A not needed  Patient able to ambulate with walker maintaining a oxygen saturation of 90% and greater on room are. Patient does not need oxygen at home.

## 2020-03-12 NOTE — TOC Benefit Eligibility Note (Signed)
Transition of Care [facility]) Benefit Eligibility Note    Patient Details  Name: Tristan Gardner MRN: [id number] Date of Birth: 03-Dec-1934   Medication/Dose: Eliquis 2.5mg . and or 5mg . bid for 30 day supply  Covered?: Yes  Tier: 3 Drug  Prescription Coverage [facility]erred [facility]: [facility]  Spoke with Person/Company/Phone Number:: [phone]. W/[employer] [facility] Help Desk, [phone]  Co-Pay: $47.00  Prior Approval: No  Deductible: Unmet       TD#[phone] Phone Number: 03/12/2020, 3:25 PM

## 2020-03-12 NOTE — Progress Notes (Signed)
Family Medicine Teaching Service Daily Progress Note Intern Pager: [phone]  Patient name: Tristan Gardner Medical record number: [id number] Date of birth: [birth year] Age: 85 y.o. Gender: male  Primary Care Provider: Patient, No Pcp Per Consultants: Cardiology Code Status: DNR  Pt Overview and Major Events to Date:  9/9: admitted, cardiology consulted  Assessment and Plan:  Sherif Millspaugh is an 85 y/o male presenting with chest pain and dyspnea. PMH is significant for paroxysmal a-fib, iron deficiency anemia, sciatica, chronic low back pain, and obesity.  Dyspnea, unclear etiology Unchanged from admission. Patient reports dyspnea at rest and with exertion, although O2 sats >95% on room air.  Etiology remains unknown- awaiting echo to see if CHF is contributing factor but BNP not significantly elevated. May be symptomatic a-fib. Less likely symptomatic anemia as Hgb improved to 9.6 today s/p 1u pRBCs without improvement in dyspnea. Trop 77>74, not consistent with ACS. CTA did not reveal PE or other pulmonary etiology. -Echo pending -s/p IV Lasix 40mg  -PO Lasix 40mg  today -Cardiology following, appreciate recommendations -PT eval, ambulatory O2 sats  Paroxysmal A-fib  A-flutter Patient in intermittent a-fib on telemetry overnight. CHADS2VASC 2 (3 if CHF on echo), not on anticoagulation 2/2 chronic anemia. -Cardiology following, appreciate recommendations -Metoprolol 25mg  BID -Aspirin 81mg  daily  Chronic microcytic anemia Patient reportedly with long hx of anemia with baseline Hgb 8-9. Colonoscopy ~10 years ago was reportedly normal, no underlying etiology of iron deficiency identified. Was recently seen by hematology and prescribed iron supplementation and iron infusions but has not taken it yet. Hgb 9.6 this morning (from 8.2, s/p 1u pRBCs). -s/p 1u pRBC transfusion -f/u UA results -IV feraheme 510mg  x1  FEN/GI: Full diet PPx: Lovenox   Status is: Inpatient Remains inpatient  appropriate because:Ongoing diagnostic testing needed not appropriate for outpatient work up   Dispo: The patient is from: Home              Anticipated d/c is to: Home              Anticipated d/c date is: 1 day              Patient currently is medically stable to d/c.    Subjective:  Patient complains of ongoing SOB at rest and with exertion. Not exacerbated by laying flat. Denies chest pain or palpitations. States he's eating and drinking at baseline, normal BM yesterday.   Objective: Temp:  [97.9 F (36.6 C)-98.6 F (37 C)] 98 F (36.7 C) (09/10 0435) Pulse Rate:  [46-128] 63 (09/10 0435) Resp:  [12-25] 18 (09/10 0435) BP: (120-175)/(67-125) 138/75 (09/10 0435) SpO2:  [92 %-100 %] 94 % (09/10 0435) Weight:  [117.9 kg-123.3 kg] 123 kg (09/10 0435) Physical Exam: General: alert, oriented, NAD Neck: no JVD Cardiovascular: irregularly irregular rhythm, normal S1/S2 without m/r/g Respiratory: normal work of breathing, lungs CTAB Abdomen: soft, nontender, nondistended Extremities: trace pedal edema Neuro: grossly intact  Laboratory: Recent Labs  Lab 03/11/20 1214 03/12/20 0422  WBC 3.3* 4.5  HGB 8.2* 9.6*  HCT 27.9* 31.2*  PLT 245 236   Recent Labs  Lab 03/11/20 1214 03/12/20 0422  NA 136 136  K 4.3 4.3  CL 101 98  CO2 24 29  BUN 8 9  CREATININE 1.04 1.04  CALCIUM 8.6* 9.2  GLUCOSE 126* 126*    Imaging/Diagnostic Tests: CT Angio Chest PE W and/or Wo Contrast Result Date: 03/11/2020 IMPRESSION: No central or segmental pulmonary embolus. Limited evaluation of the subsegmental level due to timing of  intravenous contrast and motion artifact. No acute intrathoracic abnormality. At least mild four-vessel coronary artery calcifications.  DG Chest Port 1 View Result Date: 03/11/2020 IMPRESSION: Mildly prominent interstitial markings which may reflect bronchitic type lung changes versus mild edema.    Maury Dus, MD 03/12/2020, 7:19 AM PGY-1, [facility]  Family Medicine FPTS Intern pager: [phone], text pages welcome

## 2020-03-12 NOTE — Progress Notes (Signed)
PT Cancellation Note  Patient Details Name: Tristan Gardner MRN: [id number] DOB: 1935-02-22   Cancelled Treatment:    Reason Eval/Treat Not Completed: (P) Patient at procedure or test/unavailable Pt is off floor for ECHO. PT will follow back this afternoon for Evaluation.  Tristan Gardner PT, DPT Acute Rehabilitation Services Pager [phone] Office [phone]    Tristan Gardner Fleet 03/12/2020, 10:05 AM

## 2020-03-12 NOTE — Care Management (Signed)
1526 03-12-20 Case Manager spoke with patient regarding home health referral -patient declined home health services. MD and Staff RN aware- granddaughter at the bedside. Patient is agreeable to the rolling walker via [employer]. Referral sent to [employer] and durable medical equipment (DME) RW will be delivered to the room prior to transition home. MD to place order for DME. No further needs from Case Manager at this time. Graves-Bigelow, Lamar Laundry, RN, BSN Case Manager

## 2020-03-12 NOTE — Evaluation (Signed)
Occupational Therapy Evaluation Patient Details Name: Tristan Gardner MRN: [id number] DOB: 11-20-1934 Today's Date: 03/12/2020    History of Present Illness 85 y/o male presenting with chest pain and dyspnea. PMH is significant for paroxysmal a-fib, iron deficiency anemia, sciatica, chronic low back pain, and obesity. Admitted 03/11/20 for treatment of chest pain, and A-flutter. s/p 1 u pRBC   Clinical Impression   Patient presents with chronic leg/nerve pain, L shoulder discomfort and unsteadiness on his feet.  He is currently functioning at of near his baseline for self care, functional mobility and toilet skills.  No further OT follow up in the acute setting.  Please encourage the patient to do as much as possible for himself.  Mobility to the toilet.  HH PT is ordered, whatever agency he chooses, they can sent [facility] OT, but this OT is not recommending it.      Follow Up Recommendations  No OT follow up                 Precautions / Restrictions Precautions Precautions: Fall Precaution Comments: L leg weakness, chronic Restrictions Weight Bearing Restrictions: No      Mobility Bed Mobility Overal bed mobility: Modified Independent             General bed mobility comments: use of bed rail to pull to EoB  Transfers Overall transfer level: Needs assistance Equipment used: None;Straight cane Transfers: Sit to/from [employer] Transfers Sit to Stand: Modified independent (Device/Increase time) Stand pivot transfers: Supervision       General transfer comment: mod I to power up from bed and supervision to pivot to chair, once in chair able to use cane for power up with decreased weightbearing on R LE    Balance Overall balance assessment: Needs assistance Sitting-balance support: Feet supported;No upper extremity supported Sitting balance-Leahy Scale: Normal     Standing balance support: No upper extremity supported;During functional activity;Single extremity  supported Standing balance-Leahy Scale: Fair Standing balance comment: unable to withstand any perturbance                           ADL either performed or assessed with clinical judgement   ADL Overall ADL's : At baseline                                       General ADL Comments: Patient is a;ittle limited by lines and leads in the acute setting.  But close to baseline.     Vision Baseline Vision/History: Wears glasses Wears Glasses: At all times Patient Visual Report: No change from baseline       Perception     Praxis      Pertinent Vitals/Pain Pain Assessment: Faces Pain Score: 10-Worst pain ever Faces Pain Scale: Hurts little more Pain Location: R LE Pain Descriptors / Indicators: Shooting;Sharp Pain Intervention(s): Limited activity within patient's tolerance;Monitored during session;Repositioned     Hand Dominance Right   Extremity/Trunk Assessment Upper Extremity Assessment Upper Extremity Assessment: Overall WFL for tasks assessed LUE Deficits / Details: Patient had B RCT with repairs.  Chronic B shoulder dysfunction.  L shoulder pain eexpressed was different, but improved.   Lower Extremity Assessment Lower Extremity Assessment: Defer to PT evaluation RLE Deficits / Details: sciatica and occasional jerking, ROM WFL, strength grossly 3+/5, describes increasing nerve pain and decreased motor ability with increased use  RLE:  Unable to fully assess due to pain RLE Sensation: decreased light touch (entire L LE ) RLE Coordination: decreased gross motor;decreased fine motor   Cervical / Trunk Assessment Cervical / Trunk Assessment: Kyphotic   Communication Communication Communication: HOH   Cognition Arousal/Alertness: Awake/alert Behavior During Therapy: WFL for tasks assessed/performed Overall Cognitive Status: Within Functional Limits for tasks assessed                                     General Comments   VSS on RA     Exercises     Shoulder Instructions      Home Living Family/patient expects to be discharged to:: Private residence Living Arrangements: Children;Other relatives Available Help at Discharge: Family;Available 24 hours/day Type of Home: House Home Access: Stairs to enter [employer] of Steps: 2 Entrance Stairs-Rails: None Home Layout: Two level;Able to live on main level with bedroom/bathroom Alternate Level Stairs-Number of Steps:  (never goes upstairs)   Bathroom Shower/Tub: IT trainer: Standard Bathroom Accessibility: No   Home Equipment: Grab bars - tub/shower;Hand held shower head;Tub bench;Walker - 4 wheels;Cane - single point          Prior Functioning/Environment Level of Independence: Needs assistance  Gait / Transfers Assistance Needed: limited community ambulation with cane or Rollator , uses scooter in stores  ADL's / Homemaking Assistance Needed: Patient's daughter completed majority of home managment and meal prep.  Patient stated he could assist a little.  Chronic back issues.            OT Problem List: Impaired balance (sitting and/or standing);Pain      OT Treatment/Interventions:      OT Goals(Current goals can be found in the care plan section) Acute Rehab OT Goals Patient Stated Goal: Patient wants a 2WRW to assist with balance and help with back pain. OT Goal Formulation: With patient Time For Goal Achievement: 03/19/20 Potential to Achieve Goals: Good  OT Frequency:     Barriers to D/C:            Co-evaluation              AM-PAC OT "6 Clicks" Daily Activity     Outcome Measure Help from another person eating meals?: None Help from another person taking care of personal grooming?: A Little Help from another person toileting, which includes using toliet, bedpan, or urinal?: A Little Help from another person bathing (including washing, rinsing, drying)?: A Little Help from  another person to put on and taking off regular upper body clothing?: A Little Help from another person to put on and taking off regular lower body clothing?: A Little 6 Click Score: 19   End of Session Equipment Utilized During Treatment: Gait belt;Other (comment) [facility]) Nurse Communication: Other (comment) (patient positioned in recliner.)  Activity Tolerance: Patient tolerated treatment well Patient left: in chair;with call bell/phone within reach;with chair alarm set  OT Visit Diagnosis: Unsteadiness on feet (R26.81);Pain Pain - part of body: Shoulder;Leg                Time: 1200-1230 OT Time Calculation (min): 30 min Charges:  OT General Charges $OT Visit: 1 Visit OT Evaluation $OT Eval High Complexity: 1 High OT Treatments $Self Care/Home Management : 8-22 mins  03/12/2020  Rich, OTR/L  Acute Rehabilitation Services  Office:  [phone]   Suzanna Obey 03/12/2020, 1:52 PM

## 2020-03-12 NOTE — Discharge Summary (Addendum)
Family Medicine Teaching [facility] Discharge Summary  Patient name: Tristan Gardner Medical record number: [id number] Date of birth: [birth year] Age: 85 y.o. Gender: male Date of Admission: 03/11/2020  Date of Discharge: 03/12/2020 Admitting Physician: Shirlean Mylar, MD  Primary Care Provider: Patient, No Pcp Per Consultants: Cardiology  Indication for Hospitalization: Dyspnea, A-flutter, Anemia  Discharge Diagnoses/Problem List:  Atrial Flutter Atrial Fibrillation Iron Deficiency Anemia Dyspnea  Disposition: Home  Discharge Condition: Stable  Discharge Exam:  General: alert, oriented, NAD Neck: no JVD Cardiovascular: irregularly irregular rhythm, normal S1/S2 without m/r/g Respiratory: normal work of breathing, lungs CTAB Abdomen: soft, nontender, nondistended Extremities: trace pedal edema Neuro: grossly intact  Brief Hospital Course:  Tristan Gardner is an 85 y/o male who presented with chest pain and dyspnea. PMH is significant for paroxysmal a-fib, iron deficiency anemia, sciatica, chronic low back pain, and obesity.  Dyspnea, Chest Pain Paroxysmal A-fib  A-flutter Patient presented with chest pain and dyspnea. He was initially tachycardic to 128, but all other vitals were stable, with O2 sats >95% on room air. Workup for cardiac etiologies was largely unremarkable- no CHF (BNP 155, echo normal), and no ACS/MI (trop 77>75, no ST/T changes on EKG, no regional wall motion abnormality on echo). However, EKG did show intermittent a-flutter and a-fib with initial HR 128. Cardiology was consulted and he was started on Metoprolol 25mg  BID for rate control. He was watched overnight on telemetry, which revealed a-flutter. Patient's chest pain resolved but he complained of persistent dyspnea. No pulmonary cause of dyspnea was identified- CTA had no evidence of PE and no significant pulmonary findings. Anemia was considered as potential cause of patient's dyspnea, although his  symptoms did not change s/p transfusion of 1u pRBCs (hgb 8.2>9.6). Did not appear to be anxiety related.  Patient's dyspnea ultimately thought to be secondary to a-fib/a-flutter. Cardiology recommended 4 week trial of Eliquis followed by elective cardioversion. Patient was not started on Eliquis while admitted, as he has follow up with his regular cardiologist this week. Thought to be possible component of GERD as well, so patient was given Pepcid 20mg  BID on discharge. Patient was medically stable at discharge with return precautions discussed.  Chronic microcytic anemia Patient reportedly with long hx of anemia with baseline Hgb ~9. On admission his hemoglobin was found to be 8.2. He was given 1u pRBC d/t cardiac hx and symptomatic anemia with improvement in his Hgb to 9.6. Patient records obtained from [facility] revealed colonoscopy and EGD performed in June 2020 were normal. He has also been seen by hematology in the past, who recommended initiation of iron supplementation (which patient hasn't started yet). Patient received 1 dose IV iron while admitted and was instructed to start iron supplementation on discharge.   Issues for Follow Up:  1. Cardiology recommended trial of Eliquis and elective cardioversion 2. F/u anemia after iron supplementation 3. Recommend outpatient GI evaluation for iron deficiency anemia   Significant Procedures: None  Significant Labs and Imaging:  Recent Labs  Lab 03/11/20 1214 03/12/20 0422  WBC 3.3* 4.5  HGB 8.2* 9.6*  HCT 27.9* 31.2*  PLT 245 236   Recent Labs  Lab 03/11/20 1214 03/11/20 1412 03/12/20 0422  NA 136  --  136  K 4.3  --  4.3  CL 101  --  98  CO2 24  --  29  GLUCOSE 126*  --  126*  BUN 8  --  9  CREATININE 1.04  --  1.04  CALCIUM  8.6*  --  9.2  MG  --  1.9 1.9  PHOS  --   --  3.8    CT Angio Chest PE W and/or Wo Contrast Result Date: 03/11/2020 IMPRESSION: No central or segmental pulmonary embolus. Limited  evaluation of the subsegmental level due to timing of intravenous contrast and motion artifact. No acute intrathoracic abnormality. At least mild four-vessel coronary artery calcifications.  DG Chest Port 1 View Result Date: 03/11/2020 IMPRESSION: Mildly prominent interstitial markings which may reflect bronchitic type lung changes versus mild edema.   ECHOCARDIOGRAM COMPLETE Result Date: 03/12/2020 IMPRESSIONS   1. Left ventricular ejection fraction, by estimation, is 50 to 55%. The left ventricle has low normal function. The left ventricle demonstrates global hypokinesis. Left ventricular diastolic function could not be evaluated.   2. Right ventricular systolic function is normal. The right ventricular size is normal. There is normal pulmonary artery systolic pressure.   3. Right atrial size was mildly dilated.   4. The mitral valve is grossly normal. Mild to moderate mitral valve regurgitation. No evidence of mitral stenosis.   5. Tricuspid valve regurgitation is moderate.   6. The aortic valve is tricuspid. There is mild thickening of the aortic valve. Aortic valve regurgitation is not visualized. No aortic stenosis is present.    Results/Tests Pending at Time of Discharge: None  Discharge Medications:  Allergies as of 03/12/2020       Reactions   Iodine Hives   Pt states allergy to shellfish only        Medication List     STOP taking these medications    ibuprofen 200 MG tablet Commonly known as: ADVIL   metoprolol succinate 25 MG 24 hr tablet Commonly known as: TOPROL-XL   naproxen 375 MG tablet Commonly known as: NAPROSYN       TAKE these medications    acetaminophen 500 MG tablet Commonly known as: TYLENOL Take 1,000 mg by mouth every 6 (six) hours as needed for mild pain.   albuterol 108 (90 Base) MCG/ACT inhaler Commonly known as: VENTOLIN HFA Inhale 2 puffs into the lungs every 4 (four) hours as needed for wheezing or shortness of breath.   aspirin EC  81 MG tablet Take 81 mg by mouth daily. Swallow whole. Notes to patient: Prevents clotting and stroke    baclofen 10 MG tablet Commonly known as: LIORESAL Take 10 mg by mouth daily. Notes to patient: Muscle relaxant  Not given in hospital can take today if needed   calcium carbonate 500 MG chewable tablet Commonly known as: TUMS - dosed in mg elemental calcium Chew 1 tablet (200 mg of elemental calcium total) by mouth 2 (two) times daily as needed for indigestion or heartburn.   famotidine 20 MG tablet Commonly known as: PEPCID Take 1 tablet (20 mg total) by mouth 2 (two) times daily. What changed: when to take this Notes to patient: Acid reflux  Take 20 mg two times a day (were taken 20 mg once a day)   ferrous gluconate 324 MG tablet Commonly known as: FERGON Take 1 tablet by mouth daily with breakfast. Notes to patient: Iron supplement    furosemide 40 MG tablet Commonly known as: LASIX Take 1 tablet (40 mg total) by mouth daily. Start taking on: March 13, 2020 What changed:   medication strength  how much to take  when to take this Notes to patient: Fluid medication  Take 40 mg in morning and none in the evening (were taking  20 mg 2 times a day)   gabapentin 100 MG capsule Commonly known as: NEURONTIN Take 200 mg by mouth 3 (three) times daily. Notes to patient: Nerve pain    metoprolol tartrate 25 MG tablet Commonly known as: LOPRESSOR Take 1 tablet (25 mg total) by mouth 2 (two) times daily. Notes to patient: Controls heart rhythm  Decreases work of the heart  Lowers blood pressure and heart rate (SHORT ACTING TAKE TWO TIMES A DAY) (were taking long acting once a day)   traZODone 50 MG tablet Commonly known as: DESYREL Take 1 tablet by mouth at bedtime as needed for sleep.               Durable Medical Equipment  (From admission, onward)           Start     Ordered   03/12/20 1548  For home use only DME Walker rolling  Once        Question Answer Comment  Walker: With 5 Inch Wheels   Patient needs a walker to treat with the following condition Unstable gait      03/12/20 1548            Discharge Instructions: Please refer to Patient Instructions section of EMR for full details.  Patient was counseled important signs and symptoms that should prompt return to medical care, changes in medications, dietary instructions, activity restrictions, and follow up appointments.   Follow-Up Appointments:  Follow-up Information     Penni Bombard, MD. Go on 03/18/2020.   Specialty: Cardiology Why: for follow up and further evaluation at 1:20pm Contact information: [address] [place] [address] [phone]         [facility] Follow up.   Why: Rolling Walker- to be delivered to the room prior to transition home.  Contact information: [address] [place] [address] [phone]                 Maury Dus, MD 03/12/2020, 4:19 PM PGY-1, [facility]  Orpah Cobb, DO [facility] Family Medicine, PGY3 03/14/2020 2:38 PM

## 2020-03-12 NOTE — Care Management Obs Status (Signed)
MEDICARE OBSERVATION STATUS NOTIFICATION   Patient Details  Name: Tristan Gardner MRN: [id number] Date of Birth: [birth year]   Medicare Observation Status Notification Given:  Yes    Gala Lewandowsky, RN 03/12/2020, 5:03 PM

## 2020-03-12 NOTE — Progress Notes (Signed)
Right shoulder pain radiating down to finger tips. 7/10 increases when lifting arm, with palpation and movement. Increased in upper anterior arm after moving. Described as aching pain.

## 2020-05-17 ENCOUNTER — Other Ambulatory Visit: Payer: Self-pay

## 2020-05-17 ENCOUNTER — Emergency Department (HOSPITAL_COMMUNITY): Payer: Medicare PPO

## 2020-05-17 ENCOUNTER — Observation Stay (HOSPITAL_COMMUNITY)
Admission: EM | Admit: 2020-05-17 | Discharge: 2020-05-18 | Disposition: A | Discharge status: Home / Self Care | Payer: Medicare PPO | Attending: Internal Medicine | Admitting: Internal Medicine

## 2020-05-17 DIAGNOSIS — R0789 Other chest pain: Secondary | ICD-10-CM | POA: Diagnosis present

## 2020-05-17 DIAGNOSIS — R079 Chest pain, unspecified: Secondary | ICD-10-CM

## 2020-05-17 DIAGNOSIS — I2510 Atherosclerotic heart disease of native coronary artery without angina pectoris: Secondary | ICD-10-CM | POA: Insufficient documentation

## 2020-05-17 DIAGNOSIS — Z20822 Contact with and (suspected) exposure to covid-19: Secondary | ICD-10-CM | POA: Insufficient documentation

## 2020-05-17 DIAGNOSIS — Z7982 Long term (current) use of aspirin: Secondary | ICD-10-CM | POA: Insufficient documentation

## 2020-05-17 DIAGNOSIS — I484 Atypical atrial flutter: Secondary | ICD-10-CM | POA: Diagnosis not present

## 2020-05-17 DIAGNOSIS — I4892 Unspecified atrial flutter: Secondary | ICD-10-CM | POA: Diagnosis present

## 2020-05-17 DIAGNOSIS — R0600 Dyspnea, unspecified: Secondary | ICD-10-CM | POA: Diagnosis not present

## 2020-05-17 DIAGNOSIS — G4739 Other sleep apnea: Secondary | ICD-10-CM

## 2020-05-17 DIAGNOSIS — I5032 Chronic diastolic (congestive) heart failure: Secondary | ICD-10-CM | POA: Diagnosis not present

## 2020-05-17 DIAGNOSIS — R0609 Other forms of dyspnea: Secondary | ICD-10-CM | POA: Insufficient documentation

## 2020-05-17 DIAGNOSIS — Z79899 Other long term (current) drug therapy: Secondary | ICD-10-CM | POA: Insufficient documentation

## 2020-05-17 DIAGNOSIS — J069 Acute upper respiratory infection, unspecified: Secondary | ICD-10-CM | POA: Diagnosis present

## 2020-05-17 DIAGNOSIS — R063 Periodic breathing: Secondary | ICD-10-CM | POA: Diagnosis not present

## 2020-05-17 LAB — D-DIMER, QUANTITATIVE: D-Dimer, Quant: 0.36 ug/mL-FEU (ref 0.00–0.50)

## 2020-05-17 LAB — COMPREHENSIVE METABOLIC PANEL
ALT: 16 U/L (ref 0–44)
AST: 28 U/L (ref 15–41)
Albumin: 3.2 g/dL — ABNORMAL LOW (ref 3.5–5.0)
Alkaline Phosphatase: 58 U/L (ref 38–126)
Anion gap: 11 (ref 5–15)
BUN: 9 mg/dL (ref 8–23)
CO2: 21 mmol/L — ABNORMAL LOW (ref 22–32)
Calcium: 8.5 mg/dL — ABNORMAL LOW (ref 8.9–10.3)
Chloride: 105 mmol/L (ref 98–111)
Creatinine, Ser: 1.07 mg/dL (ref 0.61–1.24)
GFR, Estimated: >60 mL/min (ref >60)
Glucose, Bld: 98 mg/dL (ref 70–99)
Potassium: 4 mmol/L (ref 3.5–5.1)
Sodium: 137 mmol/L (ref 135–145)
Total Bilirubin: 0.4 mg/dL (ref 0.3–1.2)
Total Protein: 7.5 g/dL (ref 6.5–8.1)

## 2020-05-17 LAB — CBC WITH DIFFERENTIAL/PLATELET
Abs Immature Granulocytes: 0.01 10*3/uL (ref 0.00–0.07)
Basophils Absolute: 0 10*3/uL (ref 0.0–0.1)
Basophils Relative: 0 %
Eosinophils Absolute: 0.2 10*3/uL (ref 0.0–0.5)
Eosinophils Relative: 5 %
HCT: 45.8 % (ref 39.0–52.0)
Hemoglobin: 14.5 g/dL (ref 13.0–17.0)
Immature Granulocytes: 0 %
Lymphocytes Relative: 42 %
Lymphs Abs: 1.2 10*3/uL (ref 0.7–4.0)
MCH: 27.6 pg (ref 26.0–34.0)
MCHC: 31.7 g/dL (ref 30.0–36.0)
MCV: 87.2 fL (ref 80.0–100.0)
Monocytes Absolute: 0.5 10*3/uL (ref 0.1–1.0)
Monocytes Relative: 17 %
Neutro Abs: 1 10*3/uL — ABNORMAL LOW (ref 1.7–7.7)
Neutrophils Relative %: 36 %
Platelets: 160 10*3/uL (ref 150–400)
RBC: 5.25 MIL/uL (ref 4.22–5.81)
RDW: 25.2 % — ABNORMAL HIGH (ref 11.5–15.5)
WBC: 2.8 10*3/uL — ABNORMAL LOW (ref 4.0–10.5)
nRBC: 0 % (ref 0.0–0.2)

## 2020-05-17 LAB — RESPIRATORY PANEL BY RT PCR (FLU A&B, COVID)
Influenza A by PCR: NEGATIVE
Influenza B by PCR: NEGATIVE
SARS Coronavirus 2 by RT PCR: NEGATIVE

## 2020-05-17 LAB — TROPONIN I (HIGH SENSITIVITY)
Troponin I (High Sensitivity): 33 ng/L — ABNORMAL HIGH (ref <18)
Troponin I (High Sensitivity): 32 ng/L — ABNORMAL HIGH (ref <18)

## 2020-05-17 LAB — BRAIN NATRIURETIC PEPTIDE: B Natriuretic Peptide: 134.4 pg/mL — ABNORMAL HIGH (ref 0.0–100.0)

## 2020-05-17 MED ORDER — ENOXAPARIN SODIUM 40 MG/0.4ML ~~LOC~~ SOLN
40.0000 mg | SUBCUTANEOUS | Status: DC
  Administered 2020-05-17: 40 mg via SUBCUTANEOUS
  Filled 2020-05-17: qty 0.4

## 2020-05-17 MED ORDER — FAMOTIDINE 20 MG PO TABS
20.0000 mg | ORAL_TABLET | Freq: Two times a day (BID) | ORAL | Status: DC
  Administered 2020-05-17 – 2020-05-18 (×3): 20 mg via ORAL
  Filled 2020-05-17 (×3): qty 1

## 2020-05-17 MED ORDER — METOPROLOL TARTRATE 25 MG PO TABS
25.0000 mg | ORAL_TABLET | Freq: Two times a day (BID) | ORAL | Status: DC
  Administered 2020-05-17: 25 mg via ORAL
  Filled 2020-05-17: qty 1

## 2020-05-17 MED ORDER — FERROUS GLUCONATE 324 (38 FE) MG PO TABS
324.0000 mg | ORAL_TABLET | Freq: Every day | ORAL | Status: DC
  Administered 2020-05-18: 324 mg via ORAL
  Filled 2020-05-17: qty 1

## 2020-05-17 MED ORDER — CALCIUM CARBONATE ANTACID 500 MG PO CHEW: 1.0000 | CHEWABLE_TABLET | Freq: Two times a day (BID) | ORAL | Status: DC | PRN

## 2020-05-17 MED ORDER — GUAIFENESIN-DM 100-10 MG/5ML PO SYRP
5.0000 mL | ORAL_SOLUTION | ORAL | Status: DC | PRN
  Administered 2020-05-17 – 2020-05-18 (×2): 5 mL via ORAL
  Filled 2020-05-17 (×2): qty 5

## 2020-05-17 MED ORDER — ASPIRIN 81 MG PO CHEW
324.0000 mg | CHEWABLE_TABLET | Freq: Once | ORAL | Status: AC
  Administered 2020-05-17: 324 mg via ORAL
  Filled 2020-05-17: qty 4

## 2020-05-17 MED ORDER — ACETAMINOPHEN 500 MG PO TABS
1000.0000 mg | ORAL_TABLET | Freq: Four times a day (QID) | ORAL | Status: DC | PRN
  Administered 2020-05-17: 1000 mg via ORAL
  Filled 2020-05-17: qty 2

## 2020-05-17 MED ORDER — NITROGLYCERIN 0.4 MG SL SUBL: 0.4000 mg | SUBLINGUAL_TABLET | SUBLINGUAL | Status: DC | PRN

## 2020-05-17 MED ORDER — GABAPENTIN 100 MG PO CAPS
200.0000 mg | ORAL_CAPSULE | Freq: Three times a day (TID) | ORAL | Status: DC
  Administered 2020-05-17 – 2020-05-18 (×3): 200 mg via ORAL
  Filled 2020-05-17 (×3): qty 2

## 2020-05-17 MED ORDER — TRAZODONE HCL 50 MG PO TABS: 50.0000 mg | ORAL_TABLET | Freq: Every evening | ORAL | Status: DC | PRN

## 2020-05-17 MED ORDER — BACLOFEN 10 MG PO TABS
10.0000 mg | ORAL_TABLET | Freq: Every day | ORAL | Status: DC
  Administered 2020-05-17 – 2020-05-18 (×2): 10 mg via ORAL
  Filled 2020-05-17 (×3): qty 1

## 2020-05-17 MED ORDER — FUROSEMIDE 40 MG PO TABS
40.0000 mg | ORAL_TABLET | Freq: Every day | ORAL | Status: DC
  Administered 2020-05-17 – 2020-05-18 (×2): 40 mg via ORAL
  Filled 2020-05-17: qty 1
  Filled 2020-05-17: qty 2

## 2020-05-17 MED ORDER — ASPIRIN EC 81 MG PO TBEC
81.0000 mg | DELAYED_RELEASE_TABLET | Freq: Every day | ORAL | Status: DC
  Administered 2020-05-18: 81 mg via ORAL
  Filled 2020-05-17 (×2): qty 1

## 2020-05-17 MED ORDER — METOPROLOL TARTRATE 50 MG PO TABS
50.0000 mg | ORAL_TABLET | Freq: Two times a day (BID) | ORAL | Status: DC
  Administered 2020-05-17 – 2020-05-18 (×2): 50 mg via ORAL
  Filled 2020-05-17: qty 1
  Filled 2020-05-17: qty 2

## 2020-05-17 MED ORDER — ALBUTEROL SULFATE HFA 108 (90 BASE) MCG/ACT IN AERS
2.0000 | INHALATION_SPRAY | RESPIRATORY_TRACT | Status: DC | PRN
  Administered 2020-05-18: 2 via RESPIRATORY_TRACT
  Filled 2020-05-17: qty 6.7

## 2020-05-17 MED ORDER — ONDANSETRON HCL 4 MG/2ML IJ SOLN: 4.0000 mg | Freq: Four times a day (QID) | INTRAMUSCULAR | Status: DC | PRN

## 2020-05-17 NOTE — ED Provider Notes (Signed)
[facility] Provider Note   CSN: [id number] Arrival date & time: 05/17/20  0703     History Chief Complaint  Patient presents with   Chest Pain    Tristan Gardner is a 85 y.o. male.  HPI      85 year old male with history of atrial fibrillation/flutter not on anticoagulation, EF 50-55% during recent admission for dyspnea, iron deficiency anemia receiving iron infusions presents with cough, shortness of breath and chest pain.  Dry cough for 4 days Woke up with chest pain with cough Coughing a lot, pain woke him up at 330AM or 4AM Laid back down and pain got worse 8/10, eased up now Feels like "someone punching me", slight pain when not coughing, worse with coughing Coughing so much feels nauseated and has sore throat Shortness of breath with coughing over the last 4 days  Worse when laying down flat, worse with walking--since September has had these symptoms Not sure if had fever, just starts sweating, had an episode yesterday not associated with pain -asked for thermometer but didn't have one at home  No chills  No leg swelling    Past Medical History:  Diagnosis Date   Atrial fibrillation [facility](HCC)    Coronary artery disease     Patient Active Problem List   Diagnosis Date Noted   Atypical chest pain 05/17/2020   Atrial fibrillation (HCC) 03/11/2020    Past Surgical History:  Procedure Laterality Date   CARDIAC SURGERY     ROTATOR CUFF REPAIR         No family history on file.  Social History   Tobacco Use   Smoking status: Former Smoker   Smokeless tobacco: Never Used  Building services engineerVaping Use   Vaping Use: Never used  Substance Use Topics   Alcohol use: No   Drug use: Never    Home Medications Prior to Admission medications   Medication Sig Start Date End Date Taking? Authorizing Provider  acetaminophen (TYLENOL) 500 MG tablet Take 1,000 mg by mouth every 6 (six) hours as needed for mild pain.   Yes [provider]  albuterol (VENTOLIN HFA) 108 (90 Base) MCG/ACT inhaler Inhale 2 puffs into the lungs every 4 (four) hours as needed for wheezing or shortness of breath. 02/24/20  Yes [provider]  aspirin EC 81 MG tablet Take 81 mg by mouth daily. Swallow whole.   Yes [provider]  baclofen (LIORESAL) 10 MG tablet Take 10 mg by mouth daily.   Yes [provider]  famotidine (PEPCID) 20 MG tablet Take 1 tablet (20 mg total) by mouth 2 (two) times daily. 03/12/20  Yes Mullis, Kiersten P, DO  furosemide (LASIX) 40 MG tablet Take 1 tablet (40 mg total) by mouth daily. 03/13/20  Yes Mullis, Kiersten P, DO  gabapentin (NEURONTIN) 100 MG capsule Take 200 mg by mouth 3 (three) times daily.   Yes [provider]  metoprolol tartrate (LOPRESSOR) 25 MG tablet Take 1 tablet (25 mg total) by mouth 2 (two) times daily. 03/12/20  Yes Mullis, Kiersten P, DO  traZODone (DESYREL) 50 MG tablet Take 50 mg by mouth at bedtime as needed for sleep.  10/21/19  Yes [provider]  calcium carbonate (TUMS - DOSED IN MG ELEMENTAL CALCIUM) 500 MG chewable tablet Chew 1 tablet (200 mg of elemental calcium total) by mouth 2 (two) times daily as needed for indigestion or heartburn. 03/12/20   Mullis, Kiersten P, DO  Allergies    Iodine, Ferrous ammonium sulfate, and Shellfish allergy  Review of Systems   Review of Systems  Constitutional: Negative for fever.  HENT: Positive for sore throat (from coughing).   Eyes: Negative for visual disturbance.  Respiratory: Positive for cough and shortness of breath.   Cardiovascular: Positive for chest pain. Negative for leg swelling.  Gastrointestinal: Positive for nausea. Negative for abdominal pain and vomiting.  Genitourinary: Negative for difficulty urinating.  Musculoskeletal: Negative for back pain and neck stiffness.  Skin: Negative for rash.  Neurological: Negative for syncope and headaches.    Physical Exam Updated Vital  Signs BP (!) 175/101    Pulse 72    Temp 98.3 F (36.8 C) (Oral)    Resp 20    Ht 5' 11.5" (1.816 m)    Wt 122.5 kg    SpO2 96%    BMI 37.13 kg/m   Physical Exam Vitals and nursing note reviewed.  Constitutional:      General: He is not in acute distress.    Appearance: He is well-developed. He is not diaphoretic.  HENT:     Head: Normocephalic and atraumatic.  Eyes:     Conjunctiva/sclera: Conjunctivae normal.  Cardiovascular:     Rate and Rhythm: Normal rate and regular rhythm.     Heart sounds: Normal heart sounds. No murmur heard.  No friction rub. No gallop.      Comments: Tenderness low chest Pulmonary:     Effort: Pulmonary effort is normal. No respiratory distress.     Breath sounds: Normal breath sounds. No wheezing or rales.  Abdominal:     General: There is no distension.     Palpations: Abdomen is soft.     Tenderness: There is no abdominal tenderness. There is no guarding.  Musculoskeletal:     Cervical back: Normal range of motion.     Comments: No arm tenderness or pain with ROM  Skin:    General: Skin is warm and dry.  Neurological:     Mental Status: He is alert and oriented to person, place, and time.     ED Results / Procedures / Treatments   Labs (all labs ordered are listed, but only abnormal results are displayed) Labs Reviewed  CBC WITH DIFFERENTIAL/PLATELET - Abnormal; Notable for the following components:      Result Value   WBC 2.8 (*)    RDW 25.2 (*)    Neutro Abs 1.0 (*)    All other components within normal limits  COMPREHENSIVE METABOLIC PANEL - Abnormal; Notable for the following components:   CO2 21 (*)    Calcium 8.5 (*)    Albumin 3.2 (*)    All other components within normal limits  BRAIN NATRIURETIC PEPTIDE - Abnormal; Notable for the following components:   B Natriuretic Peptide 134.4 (*)    All other components within normal limits  TROPONIN I (HIGH SENSITIVITY) - Abnormal; Notable for the following components:   Troponin I  (High Sensitivity) 33 (*)    All other components within normal limits  TROPONIN I (HIGH SENSITIVITY) - Abnormal; Notable for the following components:   Troponin I (High Sensitivity) 32 (*)    All other components within normal limits  RESPIRATORY PANEL BY RT PCR (FLU A&B, COVID)  D-DIMER, QUANTITATIVE (NOT AT [facility])  LIPID PANEL  HEMOGLOBIN A1C  CBC  BASIC METABOLIC PANEL    EKG EKG Interpretation  Date/Time:  Monday May 17 2020 07:14:31 EST Ventricular Rate:  66  PR Interval:    QRS Duration: 107 QT Interval:  450 QTC Calculation: 472 R Axis:   -22 Text Interpretation: Atrial flutter Borderline left axis deviation Abnormal R-wave progression, early transition Nonspecific T abnormalities, lateral leads No significant change since last tracing Confirmed by Alvira Monday ([id number]) on 05/17/2020 8:28:53 AM   Radiology DG Chest Portable 1 View  Result Date: 05/17/2020 CLINICAL DATA:  Chest pain shortness of breath for a week EXAM: PORTABLE CHEST 1 VIEW COMPARISON:  03/11/2020 chest CTA FINDINGS: Low lung volumes which accentuates the borderline heart size. There is no edema, consolidation, effusion, or pneumothorax. No osseous findings IMPRESSION: Low volume chest without acute or focal finding. Electronically Signed   By: Marnee Spring M.D.   On: 05/17/2020 07:48    Procedures Procedures (including critical care time)  Medications Ordered in ED Medications  nitroGLYCERIN (NITROSTAT) SL tablet 0.4 mg (has no administration in time range)  aspirin EC tablet 81 mg (has no administration in time range)  acetaminophen (TYLENOL) tablet 1,000 mg (has no administration in time range)  gabapentin (NEURONTIN) capsule 200 mg (200 mg Oral Given 05/17/20 1622)  albuterol (VENTOLIN HFA) 108 (90 Base) MCG/ACT inhaler 2 puff (has no administration in time range)  ferrous gluconate (FERGON) tablet 324 mg (has no administration in time range)  traZODone (DESYREL) tablet 50 mg (has no  administration in time range)  furosemide (LASIX) tablet 40 mg (40 mg Oral Given 05/17/20 1324)  famotidine (PEPCID) tablet 20 mg (20 mg Oral Given 05/17/20 1324)  guaiFENesin-dextromethorphan (ROBITUSSIN DM) 100-10 MG/5ML syrup 5 mL (5 mLs Oral Given 05/17/20 1328)  ondansetron (ZOFRAN) injection 4 mg (has no administration in time range)  enoxaparin (LOVENOX) injection 40 mg (40 mg Subcutaneous Given 05/17/20 1326)  baclofen (LIORESAL) tablet 10 mg (10 mg Oral Given 05/17/20 1622)  metoprolol tartrate (LOPRESSOR) tablet 50 mg (has no administration in time range)  aspirin chewable tablet 324 mg (324 mg Oral Given 05/17/20 1232)    ED Course  I have reviewed the triage vital signs and the nursing notes.  Pertinent labs & imaging results that were available during my care of the patient were reviewed by me and considered in my medical decision making (see chart for details).    MDM Rules/Calculators/A&P                          85 year old male with history of atrial fibrillation/flutter not on anticoagulation, EF 50-55% during recent admission for dyspnea, iron deficiency anemia receiving iron infusions presents with cough, shortness of breath and chest pain.  Sees Cardiology at [facility], Dr. Orvin Netter Hearing seen in September--not started on anticoagulation at that point, scheduled for stress test at end of October but did not receive it and scheduled for follow up in 3 days.  Differential diagnosis for cough/dyspnea/chest pain includes pulmonary embolus, dissection, pneumothorax, pneumonia, ACS, myocarditis, pericarditis, msk pain, viral infection.  EKG was done and evaluate by me and showed no acute ST changes and no signs of pericarditis--similar to prior with atrial flutter and variable block. Chest x-ray was done and evaluated by me and radiology and showed no sign of pneumonia or pneumothorax.  Do not feel history or exam are consistent with aortic dissection.  DDimer negative.Troponin  negative, no signs of ACS.  While in the ED, developed left arm pain, not changed with positioning.  Concern CP and arm pain may be related to coronary artery disease.   Will admit  for CP evaluation given high risk, dyspnea on exertion.  Medicine to admit, cardiology to consult.     Final Clinical Impression(s) / ED Diagnoses Final diagnoses:  Chest pain, unspecified type  Dyspnea on exertion    Rx / DC Orders ED Discharge Orders    None       Alvira Monday, MD 05/17/20 2230

## 2020-05-17 NOTE — ED Triage Notes (Signed)
Patient woke up this AM with chest pain, substernal, and cough. Reports pain only present with cough. Hx of Aflutter and Afib. On Eliquis for same. Reports nausea and vomiting associated with coughing fits, denies same at this time. CP 7/10 with cough.

## 2020-05-17 NOTE — H&P (Addendum)
Date: 05/17/2020               Patient Name:  Tristan Gardner MRN: [id number]  DOB: 1935-01-11 Age / Sex: 85 y.o., male   PCP: Patient, No Pcp Per         Medical Service: Internal Medicine Teaching Service         Attending Physician: Dr. Sandre Kitty, Elwin Mocha, MD    First Contact: Dr. Laural Benes Pager: [phone]  Second Contact: Dr. Ephriam Knuckles Pager: [phone]       After Hours (After 5p/  First Contact Pager: [phone]  weekends / holidays): Second Contact Pager: [phone]   Chief Complaint: Chest pain  History of Present Illness:  Mr.Callas is an 85 yo M w/ PMH of permanent A.fib, CAD, Iron deficiency anemia presenting to [facility] with chest pain. He was in his usual state of health until 4 days ago when he began to endorse sore throat with dry cough. He mentions feeling like this was due to a cold and tried OTC Nyquil without any improvement. He states that he began to have worsening symptoms with nausea and NBNB emesis over the last 3 days. This morning he woke up with sharp 7/10 central chest pain that is associated with cough. Mentions this cough is associated with dyspnea and chest congestion. Also mentions radiation down his left arm. He denies any association with exertion or improvement with rest. He mentions being admitted earlier this year for chest pain but states this pain feels significantly different from his previous episode.  Daughter provides additional history, stating that when he lived in [place], [place], he was on Coumadin for anti-coagulation but developed worsening anemia with bleeding event (unable to provide specific details) and anti-coagulation was stopped. He is currently on aspirin but no anti-coagulation.  Meds:  Acetaminophen 1000mg  q6hr PRN Albuterol 2 puffs PRN Aspirin 81mg  daily Baclofen 10mg  daily Calcium carbonate 500mg  PRN Famotidine 20mg  BID Ferrous gluconate 325mg  daily Furosemide 40mg  daily Gabapentin 200mg  TID Metoprolol tartrate 50mg   BID Trazodone 50mg  qhs  Allergies: Allergies as of 05/17/2020 - Review Complete 05/17/2020  Allergen Reaction Noted  . Iodine Hives 07/29/2017   Past Medical History:  Diagnosis Date  . Atrial fibrillation (HCC)   . Coronary artery disease    Family History:  Father had heart attack in his 70s. Mother had complications from diabetes  Social History:  Lives with grand-daughter. Used to work as . Currently retired. Pretty independent. Denies any alcohol, tobacco, illicit substance use.  Review of Systems: A complete ROS was negative except as per HPI.   Physical Exam: Blood pressure (!) 157/99, pulse 70, temperature 98.7 F (37.1 C), temperature source Oral, resp. rate 20, height 5' 11.5" (1.816 m), weight 122.5 kg, SpO2 99 %.  Gen: Well-developed, well nourished, NAD HEENT: NCAT head, EOMI, Diminished hearing Neck: supple, ROM intact, no JVD CV: Irregular rhythm, No rubs, no murmurs Pulm: CTAB, no rales, no wheezes, no dullness to percussion, frequent non-productive coughs Abd: Soft, BS+, NTND, No rebound, no guarding Extm: ROM intact, Peripheral pulses intact, No peripheral edema Skin: Dry, Warm, normal turgor Neuro: AAOx3  EKG: personally reviewed my interpretation is atrial flutter, no ischemic change, similar to prior ekg  CXR: personally reviewed my interpretation is poor inspiratory effort, no obvious lobar opacity or pleural effusion. Mild vascular congestion  Assessment & Plan by Problem: Active Problems:   Atypical chest pain  Mr.Lawry is a 85 yo M w/ PMH of PAF not on  anticoagulation, iron deficiency anemia, chronic low back pain, CAD presenting to [facility] with cough and atypical chest pain.  Atypical chest pain 2/2 MSK strain from frequent coughs Presents with atypical chest pain associated with cough. Likely viral upper respiratory infection exacerbating MSK strain chest pain. Not associated with exertion or improved with rest/nitro.  Influenza and COVID negative. X-ray w/o acute findings. Trop flat 33->32. D-dimer wnl at 0.36. EKG w/o ischemic changes. Heart score of 4 due to age, risk factors. Noted mild coronary calcifications on recent CTA. Scheduled for outpatient stress test. Reasonable for observation admission for chest pain. Will assess for risk factor modifications. Per ED, cardiology consulted. - Appreciate card recs - Robitussin for cough - Hgb a1c, lipid panel - Am ekg - Telemetry overnight - C/w home meds: aspirin 81mg  daily, baclofen, tylenol  Atrial Flutter Known atrial flutter. CHADS-VASC2 score of 3. Has-bled score of 3. Per family, previous on Warfarin but d/ced due to bleed. Follows with Dr.Rhinehart with [facility]. On metoprolol for rate control. Current HR 70. - Telemetry - C/w metoprolol 50mg  BID  Chronic diastolic heart failure Previous TTE showing LVEF 50-55% w/ global hypokinesis. Likely tachycardia induced. BNP 134.4 On furosemide 40mg  at home. Appear mildly hypervolemic.  - I&Os, daily weights - C/w home meds: furosemide 40mg  daily  DVT prophx: lovenox Diet: Cardiac Bowel: N/A Code: Full  Prior to Admission Living Arrangement: Home Anticipated Discharge Location: Home Barriers to Discharge: Medical work-up  Dispo: Admit patient to Observation with expected length of stay less than 2 midnights.  Signed: , MD 05/17/2020, 1:14 PM  Pager: [phone]

## 2020-05-17 NOTE — Consult Note (Addendum)
The patient has been seen in conjunction with Azalee Course, PAC. All aspects of care have been considered and discussed. The patient has been personally interviewed, examined, and all clinical data has been reviewed.   The chest pain is felt to be musculoskeletal related to coughing and not heart related.  The patient has intermittent abnormal breathing that appears to represent Godfrey Stokes respirations.  During the hyperpneic phase, he feels short of breath.  According to the daughter, he snores and has significant movement when asleep.  Suggest consideration of a sleep study to rule out sleep apnea and consider causes of Cheyne-Stokes respirations as a potential path to the patient's complaining of intermittent dyspnea.  Certainly severe heart failure can cause Cheyne-Stokes.  Neurological conditions and sleep apnea also causes.  The chronic atrial flutter is probably not the explanation for the patient's shortness of breath although cardioversion seems reasonable to exclude a contribution.  Please extend our thoughts to the patient's primary cardiologist at [facility] upon discharge.  We recommend no specific cardiac evaluation at this time.   [facility] will sign off.   Medication Recommendations: None Other recommendations (labs, testing, etc): Consider cardioversion per primary cardiologist.  Work-up for sleep apnea.  Work-up for [facility] Stokes explanation. Follow up as an outpatient: Primary cardiologist at [facility]   Cardiology Consultation:   Patient ID: Tristan Gardner MRN: [id number]; DOB: 03/12/1935  Admit date: 05/17/2020 Date of Consult: 05/17/2020  Primary Care Provider: Patient, No Pcp Per [facility] Cardiologist: Dr. Mer[facility] Electrophysiologist:  None    Patient Profile:   Tristan Gardner is a 85 y.o. male with a hx of iron deficiency anemia, atrial fibrillation and coronary artery calcification who is being seen today for the evaluation of  chest pain and dyspnea on exertion at the request of Dr. Nedra Hai.  History of Present Illness:   [name] is 85 yo male with PMH of iron deficiency anemia, atrial fibrillation and coronary artery calcification.  He had longstanding history of anemia and reportedly lost a pound of blood several years ago requiring blood transfusion.  Colonoscopy performed at the time did not show any culprit lesion.  He was seen by Dr. Doristine Bosworth at [facility] around 10 years ago for evaluation of chest pain in the setting of anemia.  Chest pain was reproducible with palpation.  Cardiac catheterization at the time was reportedly negative.  He also reported a longstanding history of atrial fibrillation diagnosed several years ago while living in [place].  He moved to [place] about a year ago.  He was seen by his PCP for annual visit in August at which time he reported fatigue and the shortness of breath.  He was referred to [facility] cardiology service Dr. Boneta Lucks.  Patient was started on Toprol-XL 25 mg daily and Lasix 20 mg twice daily for worsening dyspnea on exertion while in atrial fibrillation with RVR.  Echocardiogram and 48-hour Holter monitor was also recommended.  Patient unfortunately was admitted to the hospital the following day on 03/11/2020.  Echocardiogram obtained on 03/12/2020 showed EF 50 to 55%, global hypokinesis, normal pulmonary systolic pressure, mild to moderate MR, normal left atrial size and mildly dilated right atrial size.  He was seen by Dr. Jens Som at the time.  Record from [facility] showed normal colonoscopy and EGD.  Echocardiogram obtained in June 2020 demonstrated normal LV function.  He was not placed on anticoagulation therapy due to issues with compliance in the past.  Dr. Jens Som who recommended a trial of Eliquis for 4 weeks followed by elective cardioversion.  He was hoping that by converting him to sinus rhythm, we would improve his symptom of dyspnea on  exertion.  Note, patient was not discharged on Eliquis's he was scheduled to see his cardiologist Dr. Boneta Lucks in the same week.  So the decision of starting anticoagulation therapy was deferred to his primary cardiologist.  He was seen by Dr. Boneta Lucks on 04/01/2020 who recommended increase metoprolol succinate to 50 mg daily and add aspirin.  He again was not started on anticoagulation therapy.  Given his dyspnea on exertion, it was recommended he proceed with a Myoview, this was scheduled for October however later canceled.  Since his cardiology visit, patient has been followed by [facility] hematology service and has started on iron transfusion therapy.  Since starting the iron transfusion, anemia has resolved.  Iron level has normalized as well.  Patient presents today for evaluation of worsening shortness of breath, cough and pleuritic chest pain for the past 4 days.  He says one of his granddaughter was recently sick.  For the past 4 days, he has been having worsening cough and pleuritic chest pain every time he coughs.  He does not have any chest pain when he does not cough.  He also feel more short of breath when he exerts himself but denies any shortness of breath at rest.  Chest x-ray was negative for acute process.  Serial troponin was borderline elevated however flat on repeat check.  BMP 134.4.  EKG showed rate controlled atrial flutter.  Cardiology service has been consulted for dyspnea on exertion and chest discomfort.     Past Medical History:  Diagnosis Date   Atrial fibrillation (HCC)    Coronary artery disease     Past Surgical History:  Procedure Laterality Date   CARDIAC SURGERY     ROTATOR CUFF REPAIR       Home Medications:  Prior to Admission medications   Medication Sig Start Date End Date Taking? Authorizing Provider  acetaminophen (TYLENOL) 500 MG tablet Take 1,000 mg by mouth every 6 (six) hours as needed for mild pain.   Yes [provider]  albuterol  (VENTOLIN HFA) 108 (90 Base) MCG/ACT inhaler Inhale 2 puffs into the lungs every 4 (four) hours as needed for wheezing or shortness of breath. 02/24/20  Yes [provider]  aspirin EC 81 MG tablet Take 81 mg by mouth daily. Swallow whole.   Yes [provider]  baclofen (LIORESAL) 10 MG tablet Take 10 mg by mouth daily.   Yes [provider]  famotidine (PEPCID) 20 MG tablet Take 1 tablet (20 mg total) by mouth 2 (two) times daily. 03/12/20  Yes Mullis, Kiersten P, DO  furosemide (LASIX) 40 MG tablet Take 1 tablet (40 mg total) by mouth daily. 03/13/20  Yes Mullis, Kiersten P, DO  gabapentin (NEURONTIN) 100 MG capsule Take 200 mg by mouth 3 (three) times daily.   Yes [provider]  metoprolol tartrate (LOPRESSOR) 25 MG tablet Take 1 tablet (25 mg total) by mouth 2 (two) times daily. 03/12/20  Yes Mullis, Kiersten P, DO  traZODone (DESYREL) 50 MG tablet Take 50 mg by mouth at bedtime as needed for sleep.  10/21/19  Yes [provider]  calcium carbonate (TUMS - DOSED IN MG ELEMENTAL CALCIUM) 500 MG chewable tablet Chew 1 tablet (200 mg of elemental calcium total) by mouth 2 (two) times daily as needed for  indigestion or heartburn. 03/12/20   Joana ReamerMullis, Kiersten P, DO    Inpatient Medications: Scheduled Meds:  [START ON 05/18/2020] aspirin EC  81 mg Oral Daily   baclofen  10 mg Oral Daily   enoxaparin (LOVENOX) injection  40 mg Subcutaneous Q24H   famotidine  20 mg Oral BID   [START ON 05/18/2020] ferrous gluconate  324 mg Oral Q breakfast   furosemide  40 mg Oral Daily   gabapentin  200 mg Oral TID   metoprolol tartrate  50 mg Oral BID   Continuous Infusions:  PRN Meds: acetaminophen, albuterol, guaiFENesin-dextromethorphan, nitroGLYCERIN, ondansetron (ZOFRAN) IV, traZODone  Allergies:    Allergies  Allergen Reactions   Iodine Hives, Itching and Rash         Ferrous Ammonium Sulfate Hives   Shellfish Allergy Hives    Social  History:   Social History   Socioeconomic History   Marital status: Married    Spouse name: Not on file   Number of children: Not on file   Years of education: Not on file   Highest education level: Not on file  Occupational History   Not on file  Tobacco Use   Smoking status: Former Smoker   Smokeless tobacco: Never Used  Building services engineerVaping Use   Vaping Use: Never used  Substance and Sexual Activity   Alcohol use: No   Drug use: Never   Sexual activity: Not on file  Other Topics Concern   Not on file  Social History Narrative   Not on file   Social Determinants of Health   Financial Resource Strain:    Difficulty of Paying Living Expenses: Not on file  Food Insecurity:    Worried About Programme researcher, broadcasting/film/videounning Out of Food in the Last Year: Not on file   [employer] Out of Food in the Last Year: Not on file  Transportation Needs:    Lack of Transportation (Medical): Not on file   Lack of Transportation (Non-Medical): Not on file  Physical Activity:    Days of Exercise per Week: Not on file   Minutes of Exercise per Session: Not on file  Stress:    Feeling of Stress : Not on file  Social Connections:    Frequency of Communication with Friends and Family: Not on file   Frequency of Social Gatherings with Friends and Family: Not on file   Attends Religious Services: Not on file   Active Member of Clubs or Organizations: Not on file   Attends BankerClub or Organization Meetings: Not on file   Marital Status: Not on file  Intimate Partner Violence:    Fear of Current or Ex-Partner: Not on file   Emotionally Abused: Not on file   Physically Abused: Not on file   Sexually Abused: Not on file    Family History:   No family history on file.   ROS:  Please see the history of present illness.   All other ROS reviewed and negative.     Physical Exam/Data:   Vitals:   05/17/20 1210 05/17/20 1215 05/17/20 1315 05/17/20 1330  BP: (!) 170/97 (!) 157/99 (!) 144/94 (!) 168/97    Pulse: 70 70 69 70  Resp: (!) 22 20 (!) 25 (!) 21  Temp:      TempSrc:      SpO2: 97% 99% 99% 96%  Weight:      Height:       No intake or output data in the 24 hours ending 05/17/20 1602 Last 3 Weights  05/17/2020 03/12/2020 03/11/2020  Weight (lbs) 270 lb 271 lb 3.2 oz 271 lb 13.2 oz  Weight (kg) 122.471 kg 123.016 kg 123.3 kg     Body mass index is 37.13 kg/m.  General:  Well nourished, well developed, in no acute distress HEENT: normal Lymph: no adenopathy Neck: no JVD Endocrine:  No thryomegaly Vascular: No carotid bruits; FA pulses 2+ bilaterally without bruits  Cardiac:  normal S1, S2; RRR; no murmur  Lungs:  clear to auscultation bilaterally, no wheezing, rhonchi or rales  Abd: soft, nontender, no hepatomegaly  Ext: no edema Musculoskeletal:  No deformities, BUE and BLE strength normal and equal Skin: warm and dry  Neuro:  CNs 2-12 intact, no focal abnormalities noted Psych:  Normal affect   EKG:  The EKG was personally reviewed and demonstrates: Atrial flutter, rate controlled Telemetry:  Telemetry was personally reviewed and demonstrates: Atrial flutter, rate controlled  Relevant CV Studies:  Echo 03/12/2020 1. Left ventricular ejection fraction, by estimation, is 50 to 55%. The  left ventricle has low normal function. The left ventricle demonstrates  global hypokinesis. Left ventricular diastolic function could not be  evaluated.  2. Right ventricular systolic function is normal. The right ventricular  size is normal. There is normal pulmonary artery systolic pressure.  3. Right atrial size was mildly dilated.  4. The mitral valve is grossly normal. Mild to moderate mitral valve  regurgitation. No evidence of mitral stenosis.  5. Tricuspid valve regurgitation is moderate.  6. The aortic valve is tricuspid. There is mild thickening of the aortic  valve. Aortic valve regurgitation is not visualized. No aortic stenosis is  present.    Laboratory  Data:  High Sensitivity Troponin:   Recent Labs  Lab 05/17/20 0705 05/17/20 0934  TROPONINIHS 33* 32*     Chemistry Recent Labs  Lab 05/17/20 0705  NA 137  K 4.0  CL 105  CO2 21*  GLUCOSE 98  BUN 9  CREATININE 1.07  CALCIUM 8.5*  GFRNONAA >60  ANIONGAP 11    Recent Labs  Lab 05/17/20 0705  PROT 7.5  ALBUMIN 3.2*  AST 28  ALT 16  ALKPHOS 58  BILITOT 0.4   Hematology Recent Labs  Lab 05/17/20 0705  WBC 2.8*  RBC 5.25  HGB 14.5  HCT 45.8  MCV 87.2  MCH 27.6  MCHC 31.7  RDW 25.2*  PLT 160   BNP Recent Labs  Lab 05/17/20 0705  BNP 134.4*    DDimer  Recent Labs  Lab 05/17/20 0705  DDIMER 0.36     Radiology/Studies:  DG Chest Portable 1 View  Result Date: 05/17/2020 CLINICAL DATA:  Chest pain shortness of breath for a week EXAM: PORTABLE CHEST 1 VIEW COMPARISON:  03/11/2020 chest CTA FINDINGS: Low lung volumes which accentuates the borderline heart size. There is no edema, consolidation, effusion, or pneumothorax. No osseous findings IMPRESSION: Low volume chest without acute or focal finding. Electronically Signed   By: Marnee Spring M.D.   On: 05/17/2020 07:48     Assessment and Plan:   1. Atypical chest pain:  -Serial troponin 33--32, inconsistent with ACS.  No further work-up.  Chest pain only occurs with coughing.  Clearly pleuritic.   -Patient was previously scheduled a Myoview at [facility] in October due to his dyspnea on exertion.  I would recommend treat viral illness first and complete the Myoview as outpatient given the atypical nature   2. Likely viral URI/bronchitis: Per primary team  3. Dyspnea on  exertion: Chronic issue.  Described to his primary care provider and cardiologist since August.  - seen by Dr. Katrinka Blazing who is concerned that patient has abnormal breathing pattern (cheynes stokes respiration?), may need sleep study as outpatient. Daughter mentions significant snoring.   4. Persistent atrial fibrillation/atrial  flutter: He appears to be more in atrial flutter today.  Heart rate is very well controlled.  Some his chronic dyspnea on exertion is likely related to atrial flutter.  Previous echocardiogram obtained in September showed a normal left atrial size, mildly dilated right atrial size.  Dr. Jens Som previously recommended a trial of 4 weeks of Eliquis before elective cardioversion as outpatient.  However, his primary cardiologist recommended starting on aspirin instead of anticoagulation therapy, likely related to his significant anemia history in the past.  Previous record also questioned the compliance of medication as well.   -This patients CHA2DS2-VASc Score and unadjusted Ischemic Stroke Rate (% per year) is equal to 4.8 % stroke rate/year from a score of 4  Above score calculated as 1 point each if present [CHF, HTN, DM, Vascular=MI/PAD/Aortic Plaque, Age if 65-74, or Male] Above score calculated as 2 points each if present [Age > 75, or Stroke/TIA/TE]   5. Iron deficiency anemia: Received iron therapy, anemia has resolved.  6. Coronary artery calcification: Seen on previous CT scanning in September.  Myoview as outpatient ordered by primary cardiologist.      HEAR Score (for undifferentiated chest pain):          CHA2DS2-VASc Score = 4  This indicates a 4.8% annual risk of stroke. The patient's score is based upon: CHF History: 1 HTN History: 0 Diabetes History: 0 Stroke History: 0 Vascular Disease History: 1 Age Score: 2 Gender Score: 0         For questions or updates, please contact [facility] Please consult [email or web address] for contact info under    Ramond Dial, [place]  05/17/2020 4:02 PM

## 2020-05-18 ENCOUNTER — Other Ambulatory Visit (HOSPITAL_COMMUNITY): Payer: Self-pay | Admitting: Internal Medicine

## 2020-05-18 ENCOUNTER — Encounter (HOSPITAL_COMMUNITY): Payer: Self-pay | Admitting: Internal Medicine

## 2020-05-18 DIAGNOSIS — R0609 Other forms of dyspnea: Secondary | ICD-10-CM | POA: Diagnosis not present

## 2020-05-18 DIAGNOSIS — I5032 Chronic diastolic (congestive) heart failure: Secondary | ICD-10-CM | POA: Diagnosis not present

## 2020-05-18 DIAGNOSIS — R0789 Other chest pain: Secondary | ICD-10-CM | POA: Diagnosis not present

## 2020-05-18 DIAGNOSIS — I4892 Unspecified atrial flutter: Secondary | ICD-10-CM | POA: Diagnosis present

## 2020-05-18 DIAGNOSIS — J069 Acute upper respiratory infection, unspecified: Secondary | ICD-10-CM | POA: Diagnosis present

## 2020-05-18 DIAGNOSIS — Z20822 Contact with and (suspected) exposure to covid-19: Secondary | ICD-10-CM | POA: Diagnosis not present

## 2020-05-18 LAB — CBC
HCT: 44.3 % (ref 39.0–52.0)
Hemoglobin: 14.4 g/dL (ref 13.0–17.0)
MCH: 28 pg (ref 26.0–34.0)
MCHC: 32.5 g/dL (ref 30.0–36.0)
MCV: 86.2 fL (ref 80.0–100.0)
Platelets: 141 10*3/uL — ABNORMAL LOW (ref 150–400)
RBC: 5.14 MIL/uL (ref 4.22–5.81)
RDW: 25 % — ABNORMAL HIGH (ref 11.5–15.5)
WBC: 3.7 10*3/uL — ABNORMAL LOW (ref 4.0–10.5)
nRBC: 0 % (ref 0.0–0.2)

## 2020-05-18 LAB — BASIC METABOLIC PANEL
Anion gap: 11 (ref 5–15)
BUN: 12 mg/dL (ref 8–23)
CO2: 24 mmol/L (ref 22–32)
Calcium: 8.9 mg/dL (ref 8.9–10.3)
Chloride: 102 mmol/L (ref 98–111)
Creatinine, Ser: 1.13 mg/dL (ref 0.61–1.24)
GFR, Estimated: >60 mL/min (ref >60)
Glucose, Bld: 160 mg/dL — ABNORMAL HIGH (ref 70–99)
Potassium: 3.9 mmol/L (ref 3.5–5.1)
Sodium: 137 mmol/L (ref 135–145)

## 2020-05-18 LAB — HEMOGLOBIN A1C
Hgb A1c MFr Bld: 6.1 % — ABNORMAL HIGH (ref 4.8–5.6)
Mean Plasma Glucose: 128.37 mg/dL

## 2020-05-18 LAB — LIPID PANEL
Cholesterol: 192 mg/dL (ref 0–200)
HDL: 46 mg/dL (ref >40)
LDL Cholesterol: 128 mg/dL — ABNORMAL HIGH (ref 0–99)
Total CHOL/HDL Ratio: 4.2 RATIO
Triglycerides: 90 mg/dL (ref <150)
VLDL: 18 mg/dL (ref 0–40)

## 2020-05-18 MED ORDER — PREDNISONE 20 MG PO TABS
40.0000 mg | ORAL_TABLET | Freq: Every day | ORAL | 0 refills | Status: DC
Start: 2020-05-18 — End: 2020-08-06

## 2020-05-18 MED ORDER — PREDNISONE 20 MG PO TABS
40.0000 mg | ORAL_TABLET | Freq: Every day | ORAL | Status: DC
  Administered 2020-05-18: 40 mg via ORAL
  Filled 2020-05-18: qty 2

## 2020-05-18 MED ORDER — FUROSEMIDE 40 MG PO TABS
20.0000 mg | ORAL_TABLET | Freq: Every day | ORAL | 0 refills | Status: DC
Start: 2020-05-18 — End: 2022-10-18

## 2020-05-18 MED ORDER — GUAIFENESIN-DM 100-10 MG/5ML PO SYRP: 5.0000 mL | ORAL_SOLUTION | ORAL | 0 refills | Status: DC | PRN

## 2020-05-18 MED ORDER — POLYSACCHARIDE IRON COMPLEX 150 MG PO CAPS: 150.0000 mg | ORAL_CAPSULE | Freq: Every day | ORAL | 0 refills | Status: DC

## 2020-05-18 MED ORDER — METOPROLOL TARTRATE 50 MG PO TABS
50.0000 mg | ORAL_TABLET | Freq: Two times a day (BID) | ORAL | 0 refills | Status: DC
Start: 2020-05-18 — End: 2020-08-06

## 2020-05-18 MED FILL — SM TUSSIN DM SYRUP: 100-10 | 12 days supply | Qty: 236 | Fill #0

## 2020-05-18 MED FILL — predniSONE 20 MG TABS: 20 | 4 days supply | Qty: 8 | Fill #0

## 2020-05-18 NOTE — Plan of Care (Signed)

## 2020-05-18 NOTE — Progress Notes (Addendum)
   Subjective:  Tristan Gardner is a 85 y.o. with PMH of Atrial Flutter not on AC, CAD, HFpEF, iron deficiency anemia admit for atypical chest pain on hospital day 0  Mr.Lamartina was examined and evaluated at bedside this am. He was resting comfortably in bed. Patient complains of ongoing chest pain with coughing. He also complains of new rhinorrhea. Patient states that he has been taking Robitussin and cold medicines 2 days ago with minimal relief. Discussed discharge plan and follow up appointment with his Cardiologist outpatient. Patient agrees with the plan.   Objective:  Vital signs in last 24 hours: Vitals:   05/18/20 0430 05/18/20 0500 05/18/20 0536 05/18/20 0547  BP: (!) 153/76 (!) 130/103 (!) 160/80 (!) 160/80  Pulse: 66 70 72 71  Resp: (!) 23 (!) 32 20 20  Temp:   98.8 F (37.1 C) 98.8 F (37.1 C)  TempSrc:   Oral Oral  SpO2: 94% 90% 100% 99%  Weight:   109.5 kg 109.5 kg  Height:   5\' 11"  (1.803 m) 5\' 11"  (1.803 m)   Gen: Well-developed, well nourished, NAD HEENT: NCAT head, hearing intact, EOMI, MMM Pulm: Frequent dry coughs, Expiratory wheezing on bilateral lung fields Extm: ROM intact, No peripheral edema Skin: Dry, Warm, normal turgor, no rashes, lesions, wounds.  Neuro: AAOx3  Assessment/Plan:  Active Problems:   Atypical chest pain  Tristan Gardner is a 85 y.o. with PMH of Atrial Flutter not on AC, CAD, HFpEF, iron deficiency anemia admit for atypical chest pain due to viral [name].  Cough, Wheezing 2/2 viral URI with questionable COPD/Asthma Significant wheezing on exam this morning compared to yesterday. No prior hx of COPD or asthma but does have distant history of tobacco use. Mentions not having improvement with supportive care over the last 4 days. May have underlying obstructive lung disease process. Will treat w/ short course of prednisone - D/c w/ prednisone 40mg  for 5 days - Guaifenesin-dextromethorphan prn - Albuterol inhaler prn  Atypical chest pain 2/2  MSK strain from frequent coughs Am EKG w/ J waves similar to prior EKG. Chest pain continues to be associated with cough with point tenderness to mid sternum. Cardiology recommend f/u outpatient stress test as well as sleep study. Vitals stable.. - Appreciate card recs - D/C today to home - C/w home meds: aspirin 81mg  daily, baclofen, tylenol  Atrial Flutter Known atrial flutter. CHADS-VASC2 score of 4. Has-bled score of 3. Per family, previous on Warfarin but d/ced due to bleed. Follows with Dr.Rhinehart with [facility]. On metoprolol for rate control. HR stable ~70s overnight on telemetry - Telemetry - C/w metoprolol 50mg  BID  Chronic diastolic heart failure Previous TTE showing LVEF 50-55% w/ global hypokinesis. Likely tachycardia induced. BNP 134.4 Overnight diuresed 2.4L. Further chart review show home dose had been decreased to 20mg  recently - I&Os, daily weights - D/c home at 20mg  furosemide daily  DVT prophx: Lovenox Diet: HH Bowel: N/A Code: Full  Prior to Admission Living Arrangement: Home Anticipated Discharge Location: Home Barriers to Discharge: Medical work-up Dispo: Anticipated discharge in approximately 1-2 day(s).   97, MD 05/18/2020, 7:26 AM Pager: [phone] After 5pm on weekdays and 1pm on weekends: On Call Pager: [phone]

## 2020-05-18 NOTE — Progress Notes (Signed)
Patient complaining of chest soreness right mid chest. Increases with palpation and coughing. Non productive cough. Lungs clear to auscultation, but audible wheezing noted. Given guaifenesin-dextromethorphan at 807 and albuterol inhaler at 817.

## 2020-05-18 NOTE — Discharge Summary (Addendum)
Name: Tristan Gardner MRN: [id number] DOB: 1934-12-29 85 y.o. PCP: Patient, No Pcp Per  Date of Admission: 05/17/2020  7:03 AM Date of Discharge: 05/18/2020 Attending Physician: Jessy Oto, MD,PHD  Discharge Diagnosis: 1. Atypical Chest Pain  Discharge Medications: Allergies as of 05/18/2020       Reactions   Iodine Hives, Itching, Rash      Ferrous Ammonium Sulfate Hives   Shellfish Allergy Hives        Medication List     STOP taking these medications    calcium carbonate 500 MG chewable tablet Commonly known as: TUMS - dosed in mg elemental calcium       TAKE these medications    acetaminophen 500 MG tablet Commonly known as: TYLENOL Take 1,000 mg by mouth every 6 (six) hours as needed for mild pain.   albuterol 108 (90 Base) MCG/ACT inhaler Commonly known as: VENTOLIN HFA Inhale 2 puffs into the lungs every 4 (four) hours as needed for wheezing or shortness of breath.   aspirin EC 81 MG tablet Take 81 mg by mouth daily. Swallow whole.   baclofen 10 MG tablet Commonly known as: LIORESAL Take 10 mg by mouth daily.   famotidine 20 MG tablet Commonly known as: PEPCID Take 1 tablet (20 mg total) by mouth 2 (two) times daily.   furosemide 40 MG tablet Commonly known as: LASIX Take 0.5 tablets (20 mg total) by mouth daily. What changed: how much to take   gabapentin 100 MG capsule Commonly known as: NEURONTIN Take 200 mg by mouth 3 (three) times daily.   guaiFENesin-dextromethorphan 100-10 MG/5ML syrup Commonly known as: ROBITUSSIN DM Take 5 mLs by mouth every 4 (four) hours as needed for cough.   iron polysaccharides 150 MG capsule Commonly known as: NIFEREX Take 1 capsule (150 mg total) by mouth daily.   metoprolol tartrate 50 MG tablet Commonly known as: LOPRESSOR Take 1 tablet (50 mg total) by mouth 2 (two) times daily. What changed:  medication strength how much to take   predniSONE 20 MG tablet Commonly known as:  DELTASONE Take 2 tablets (40 mg total) by mouth daily with breakfast.   traZODone 50 MG tablet Commonly known as: DESYREL Take 50 mg by mouth at bedtime as needed for sleep.        Disposition and follow-up:   [name] was discharged from [facility] in Stable condition.  At the hospital follow up visit please address:  1.  Atypical chest pain / Dyspnea - Due to musculoskeletal strain from frequent coughs - Treated with supportive care and 5 day course of prednisone due to significant wheezing on exam - Recommend further work-up with PFT and a sleep study  2.  Labs / imaging needed at time of follow-up: cbc, bmp  3.  Pending labs/ test needing follow-up: N/A  Follow-up Appointments:  Follow-up Information     Rhinehart, Olga Coaster, MD. Call.   Specialty: Cardiology Contact information: [address] [place] [address] [phone]                 Hospital Course by problem list:  1.  Atypical chest pain: Tristan Gardner is an 85 yo M w/ PMH of Atrial Flutter not on AC, CAD, HFpEF, iron deficiency anemia presenting to [facility] with chest pain. He was noted to have frequent cough that have been exacerbating his chest pain. He was found to have no ischemic changes on EKG, troponins flat at 33->32, and normal d-dimer.  X-ray did not show any acute findings. Cardiology consulted and assessed his chest pain to be unlikely cardiac in origin. He did have significant wheezing on exam, though with no history of asthma or COPD. He was treated with prednisone to complete a 5 day course for presumed asthma or COPD exacerbated by viral URI infection.   Discharge Vitals:   BP 109/80 (BP Location: Right Arm)   Pulse 73   Temp 98.2 F (36.8 C) (Oral)   Resp 18   Ht 5\' 11"  (1.803 m)   Wt 109.5 kg   SpO2 96%   BMI 33.67 kg/m   Pertinent Labs, Studies, and Procedures:  CBC Latest Ref Rng & Units 05/18/2020 05/17/2020 03/12/2020  WBC 4.0 - 10.5  K/uL 3.7(L) 2.8(L) 4.5  Hemoglobin 13.0 - 17.0 g/dL 05/12/2020 12.4 58.0)  Hematocrit 39 - 52 % 44.3 45.8 31.2(L)  Platelets 150 - 400 K/uL 141(L) 160 236   BMP Latest Ref Rng & Units 05/18/2020 05/17/2020 03/12/2020  Glucose 70 - 99 mg/dL 05/12/2020) 98 338(S)  BUN 8 - 23 mg/dL 12 9 9   Creatinine 0.61 - 1.24 mg/dL 505(L 9.76  Sodium 135 - 145 mmol/L 137 137 136  Potassium 3.5 - 5.1 mmol/L 3.9 4.0 4.3  Chloride 98 - 111 mmol/L 102 105 98  CO2 22 - 32 mmol/L 24 21(L) 29  Calcium 8.9 - 10.3 mg/dL 8.9 7.34) 9.2   PORTABLE CHEST 1 VIEW   FINDINGS: Low lung volumes which accentuates the borderline heart size. There is no edema, consolidation, effusion, or pneumothorax. No osseous findings   IMPRESSION: Low volume chest without acute or focal finding.    Discharge Instructions: Discharge Instructions     Call MD for:  difficulty breathing, headache or visual disturbances   Complete by: As directed    Call MD for:  persistant dizziness or light-headedness   Complete by: As directed    Call MD for:  persistant nausea and vomiting   Complete by: As directed    Call MD for:  temperature >100.4   Complete by: As directed    Diet - low sodium heart healthy   Complete by: As directed    Discharge instructions   Complete by: As directed    Dear 1.93  You came to 7.9(K with chest pain. We have determined this was caused by your frequent coughs and strain on your chest muscles. Here are our recommendations for you at discharge:  Please take prednisone 40mg  for 4 additional days Please take your robitussin Dm as needed for cough Make sure to resume your home medications I am sending a different iron supplementation for you that may be more available at your pharmacy  Thank you for choosing    Increase activity slowly   Complete by: As directed       Signed: [name], MD 05/19/2020, 5:16 PM Pager: [phone] After 5pm on weekdays and 1pm on weekends: On  Call Pager: [phone]

## 2020-05-18 NOTE — ED Notes (Signed)
Report given to nurse Tobi Bastos RN

## 2020-08-06 ENCOUNTER — Observation Stay (HOSPITAL_COMMUNITY)
Admission: EM | Admit: 2020-08-06 | Discharge: 2020-08-08 | Disposition: A | Discharge status: Home / Self Care | Payer: Medicare PPO | Attending: Internal Medicine | Admitting: Internal Medicine

## 2020-08-06 ENCOUNTER — Emergency Department (HOSPITAL_COMMUNITY): Payer: Medicare PPO

## 2020-08-06 ENCOUNTER — Encounter (HOSPITAL_COMMUNITY): Payer: Self-pay | Admitting: Internal Medicine

## 2020-08-06 ENCOUNTER — Other Ambulatory Visit: Payer: Self-pay

## 2020-08-06 DIAGNOSIS — I4891 Unspecified atrial fibrillation: Secondary | ICD-10-CM | POA: Diagnosis not present

## 2020-08-06 DIAGNOSIS — Z9181 History of falling: Secondary | ICD-10-CM | POA: Insufficient documentation

## 2020-08-06 DIAGNOSIS — I4892 Unspecified atrial flutter: Principal | ICD-10-CM | POA: Diagnosis present

## 2020-08-06 DIAGNOSIS — Z7982 Long term (current) use of aspirin: Secondary | ICD-10-CM | POA: Insufficient documentation

## 2020-08-06 DIAGNOSIS — I2510 Atherosclerotic heart disease of native coronary artery without angina pectoris: Secondary | ICD-10-CM | POA: Diagnosis not present

## 2020-08-06 DIAGNOSIS — S0990XA Unspecified injury of head, initial encounter: Secondary | ICD-10-CM | POA: Insufficient documentation

## 2020-08-06 DIAGNOSIS — R55 Syncope and collapse: Secondary | ICD-10-CM | POA: Diagnosis not present

## 2020-08-06 DIAGNOSIS — R42 Dizziness and giddiness: Secondary | ICD-10-CM | POA: Diagnosis not present

## 2020-08-06 DIAGNOSIS — R778 Other specified abnormalities of plasma proteins: Secondary | ICD-10-CM | POA: Diagnosis not present

## 2020-08-06 DIAGNOSIS — Z87891 Personal history of nicotine dependence: Secondary | ICD-10-CM | POA: Diagnosis not present

## 2020-08-06 DIAGNOSIS — Z8249 Family history of ischemic heart disease and other diseases of the circulatory system: Secondary | ICD-10-CM

## 2020-08-06 DIAGNOSIS — I10 Essential (primary) hypertension: Secondary | ICD-10-CM | POA: Insufficient documentation

## 2020-08-06 DIAGNOSIS — R0602 Shortness of breath: Secondary | ICD-10-CM | POA: Insufficient documentation

## 2020-08-06 DIAGNOSIS — R079 Chest pain, unspecified: Secondary | ICD-10-CM | POA: Diagnosis not present

## 2020-08-06 DIAGNOSIS — Y92002 Bathroom of unspecified non-institutional (private) residence single-family (private) house as the place of occurrence of the external cause: Secondary | ICD-10-CM | POA: Diagnosis not present

## 2020-08-06 DIAGNOSIS — D509 Iron deficiency anemia, unspecified: Secondary | ICD-10-CM | POA: Insufficient documentation

## 2020-08-06 DIAGNOSIS — R072 Precordial pain: Secondary | ICD-10-CM | POA: Diagnosis present

## 2020-08-06 DIAGNOSIS — Z862 Personal history of diseases of the blood and blood-forming organs and certain disorders involving the immune mechanism: Secondary | ICD-10-CM | POA: Diagnosis not present

## 2020-08-06 DIAGNOSIS — R030 Elevated blood-pressure reading, without diagnosis of hypertension: Secondary | ICD-10-CM | POA: Diagnosis not present

## 2020-08-06 DIAGNOSIS — W01198A Fall on same level from slipping, tripping and stumbling with subsequent striking against other object, initial encounter: Secondary | ICD-10-CM | POA: Insufficient documentation

## 2020-08-06 DIAGNOSIS — U071 COVID-19: Secondary | ICD-10-CM | POA: Insufficient documentation

## 2020-08-06 DIAGNOSIS — Z79899 Other long term (current) drug therapy: Secondary | ICD-10-CM | POA: Diagnosis not present

## 2020-08-06 LAB — COMPREHENSIVE METABOLIC PANEL
ALT: 19 U/L (ref 0–44)
AST: 24 U/L (ref 15–41)
Albumin: 3.4 g/dL — ABNORMAL LOW (ref 3.5–5.0)
Alkaline Phosphatase: 59 U/L (ref 38–126)
Anion gap: 13 (ref 5–15)
BUN: 11 mg/dL (ref 8–23)
CO2: 23 mmol/L (ref 22–32)
Calcium: 9 mg/dL (ref 8.9–10.3)
Chloride: 102 mmol/L (ref 98–111)
Creatinine, Ser: 1.23 mg/dL (ref 0.61–1.24)
GFR, Estimated: 58 mL/min — ABNORMAL LOW (ref >60)
Glucose, Bld: 123 mg/dL — ABNORMAL HIGH (ref 70–99)
Potassium: 3.9 mmol/L (ref 3.5–5.1)
Sodium: 138 mmol/L (ref 135–145)
Total Bilirubin: 1.1 mg/dL (ref 0.3–1.2)
Total Protein: 7.8 g/dL (ref 6.5–8.1)

## 2020-08-06 LAB — CBC WITH DIFFERENTIAL/PLATELET
Abs Immature Granulocytes: 0 10*3/uL (ref 0.00–0.07)
Basophils Absolute: 0 10*3/uL (ref 0.0–0.1)
Basophils Relative: 0 %
Eosinophils Absolute: 0.1 10*3/uL (ref 0.0–0.5)
Eosinophils Relative: 2 %
HCT: 45.9 % (ref 39.0–52.0)
Hemoglobin: 15.6 g/dL (ref 13.0–17.0)
Immature Granulocytes: 0 %
Lymphocytes Relative: 39 %
Lymphs Abs: 1.2 10*3/uL (ref 0.7–4.0)
MCH: 31.3 pg (ref 26.0–34.0)
MCHC: 34 g/dL (ref 30.0–36.0)
MCV: 92.2 fL (ref 80.0–100.0)
Monocytes Absolute: 0.3 10*3/uL (ref 0.1–1.0)
Monocytes Relative: 9 %
Neutro Abs: 1.5 10*3/uL — ABNORMAL LOW (ref 1.7–7.7)
Neutrophils Relative %: 50 %
Platelets: 186 10*3/uL (ref 150–400)
RBC: 4.98 MIL/uL (ref 4.22–5.81)
RDW: 14.3 % (ref 11.5–15.5)
WBC: 3 10*3/uL — ABNORMAL LOW (ref 4.0–10.5)
nRBC: 0 % (ref 0.0–0.2)

## 2020-08-06 LAB — TSH: TSH: 1.92 u[IU]/mL (ref 0.350–4.500)

## 2020-08-06 LAB — TROPONIN I (HIGH SENSITIVITY)
Troponin I (High Sensitivity): 48 ng/L — ABNORMAL HIGH (ref <18)
Troponin I (High Sensitivity): 36 ng/L — ABNORMAL HIGH (ref <18)

## 2020-08-06 LAB — BRAIN NATRIURETIC PEPTIDE: B Natriuretic Peptide: 38.1 pg/mL (ref 0.0–100.0)

## 2020-08-06 LAB — T4, FREE: Free T4: 1.14 ng/dL — ABNORMAL HIGH (ref 0.61–1.12)

## 2020-08-06 MED ORDER — POLYSACCHARIDE IRON COMPLEX 150 MG PO CAPS
150.0000 mg | ORAL_CAPSULE | Freq: Every day | ORAL | Status: DC
  Administered 2020-08-06 – 2020-08-08 (×3): 150 mg via ORAL
  Filled 2020-08-06 (×5): qty 1

## 2020-08-06 MED ORDER — METOPROLOL TARTRATE 5 MG/5ML IV SOLN: 2.5000 mg | Freq: Once | INTRAVENOUS | Status: DC

## 2020-08-06 MED ORDER — ACETAMINOPHEN 325 MG PO TABS
650.0000 mg | ORAL_TABLET | ORAL | Status: DC | PRN
  Administered 2020-08-07: 650 mg via ORAL
  Filled 2020-08-06: qty 2

## 2020-08-06 MED ORDER — ACETAMINOPHEN 500 MG PO TABS
1000.0000 mg | ORAL_TABLET | Freq: Once | ORAL | Status: AC
  Administered 2020-08-06: 1000 mg via ORAL
  Filled 2020-08-06: qty 2

## 2020-08-06 MED ORDER — ASPIRIN EC 81 MG PO TBEC
81.0000 mg | DELAYED_RELEASE_TABLET | Freq: Every day | ORAL | Status: DC
  Administered 2020-08-07 – 2020-08-08 (×2): 81 mg via ORAL
  Filled 2020-08-06 (×2): qty 1

## 2020-08-06 MED ORDER — HEPARIN BOLUS VIA INFUSION
4000.0000 [IU] | Freq: Once | INTRAVENOUS | Status: AC
  Administered 2020-08-06: 4000 [IU] via INTRAVENOUS
  Filled 2020-08-06: qty 4000

## 2020-08-06 MED ORDER — ONDANSETRON HCL 4 MG/2ML IJ SOLN: 4.0000 mg | Freq: Four times a day (QID) | INTRAMUSCULAR | Status: DC | PRN

## 2020-08-06 MED ORDER — CARVEDILOL 12.5 MG PO TABS
12.5000 mg | ORAL_TABLET | Freq: Two times a day (BID) | ORAL | Status: DC
  Administered 2020-08-06 – 2020-08-08 (×4): 12.5 mg via ORAL
  Filled 2020-08-06 (×5): qty 1

## 2020-08-06 MED ORDER — METOPROLOL TARTRATE 25 MG PO TABS
12.5000 mg | ORAL_TABLET | Freq: Once | ORAL | Status: AC
  Administered 2020-08-06: 12.5 mg via ORAL
  Filled 2020-08-06: qty 1

## 2020-08-06 MED ORDER — GABAPENTIN 100 MG PO CAPS
200.0000 mg | ORAL_CAPSULE | Freq: Three times a day (TID) | ORAL | Status: DC
Start: 2020-08-06 — End: 2020-08-09
  Administered 2020-08-06 – 2020-08-08 (×6): 200 mg via ORAL
  Filled 2020-08-06 (×6): qty 2

## 2020-08-06 MED ORDER — HEPARIN (PORCINE) 25000 UT/250ML-% IV SOLN
1700.0000 [IU]/h | INTRAVENOUS | Status: DC
  Administered 2020-08-06: 21:00:00 1500 [IU]/h via INTRAVENOUS
  Filled 2020-08-06 (×2): qty 250

## 2020-08-06 MED ORDER — ALBUTEROL SULFATE HFA 108 (90 BASE) MCG/ACT IN AERS: 2.0000 | INHALATION_SPRAY | RESPIRATORY_TRACT | Status: DC | PRN

## 2020-08-06 NOTE — ED Triage Notes (Signed)
BIB [facility] after pt complaining of having chest pain intermittently for about 3 days. Pt reports pain at the chest and radiating down the right arm. Right now pt not complaining of chest pain however, pt noted to be in Afib RVR w/ EMS w/ rates in the 70s-150s. Pt complains of feeling dizzy. Pt has had multiple falls at home over the past few days with the most noteable one this morning. Pt fell in bathroom and hit the back of his head. No LOC, pt denies a blood thinner. Pt AOx4. All other VSS w/ EMS. Pt NAD at this time.

## 2020-08-06 NOTE — Consult Note (Addendum)
Cardiology Consultation:   Patient ID: Tristan Gardner MRN: [id number]; DOB: 04-16-1935  Admit date: 08/06/2020 Date of Consult: 08/06/2020  Primary Care Provider: Patient, No Pcp Per [facility] Cardiologist: Penni Bombard, MD  Patient Profile:   Tristan Gardner is a 86 y.o. male with a hx of chronic atrial fibrillation/flutter, not on chronic anticoagulation 2nd to iron deficiency anemia requiring iron transfusion x2, coronary artery calcification and obesity who is being seen today for the evaluation of chest pain / syncope  at the request of Dr. Adela Lank.   History of chest pain in setting of anemia and musculoskeletal in etiology.   Admitted 03/2020 with afib RVR.  Echocardiogram obtained on 03/12/2020 showed EF 50 to 55%, global hypokinesis, normal pulmonary systolic pressure, mild to moderate MR, normal left atrial size and mildly dilated right atrial size.  He was seen by Dr. Jens Som at the time.  [facility] 12[facility] showed normal colonoscopy and EGD.   Dr. Jens Som recommended a trial of Eliquis for 4 week and outpatient cardioversion however he was not discharge on anticoagulation.   Last seen by Dr. Alysia Penna 03/2020 and recommended stress test but does not seems its done. His Toprol increased and placed on ASA but no anticoagulation. 48 hour monitor 04/2020 showed persistent atrial fluter/fibrillation at average rate of 101 bpm.   Seen by Dr. Katrinka Blazing when admitted 05/2020. His chest pain felt atypical and MSK related. Recommended "sleep study to rule out sleep apnea and consider causes of Cheyne-Stokes respirations as a potential path to the patient's complaining of intermittent dyspnea".    History of Present Illness:   Tristan Gardner patient treated for lightheadedness and dizziness.  Reports intermittent episodes of dizziness with vision issue and room spinning since Monday.  He is a poor historian.  Cannot tell how long symptoms last.  Denies associated  palpitation.  This morning he had a recurrent symptoms while in bathroom.  Felt room spinning and blurred vision.  Larey Seat and hit back of his head. No LOC.  Denied associated palpitation but had left-sided chest discomfort.  He laid down with improved symptoms but it reoccurred.  EMS was called and found in aflutter at rate of 160. Given IV lopressor 12.5mg  x 1 with Hr in 70-90s. Noted normal BP on arrival but now hypertensive at  190s/100s.  BNP 48>>36 BNP 38 SCr 1.23 K 3.9 Hgb 15.6 Normal TSH and minimally elevated Free T4 CTA of head without acute findings Chest x-ray without acute findings  No regular exercise but patient walks around the house without chest pain.  He has chronic dyspnea.  Never evaluated for sleep apnea.  Reports compliance with his medication.  Past Medical History:  Diagnosis Date  . Atrial fibrillation (HCC)   . Coronary artery disease     Past Surgical History:  Procedure Laterality Date  . CARDIAC SURGERY    . ROTATOR CUFF REPAIR      Inpatient Medications: Scheduled Meds:  Continuous Infusions:  PRN Meds:   Allergies:    Allergies  Allergen Reactions  . Iodine Hives, Itching and Rash        . Ferrous Ammonium Sulfate Hives  . Shellfish Allergy Hives    Social History:   Social History   Socioeconomic History  . Marital status: Married    Spouse name: Not on file  . Number of children: Not on file  . Years of education: Not on file  . Highest education level: Not on file  Occupational History  . Not on file  Tobacco Use  . Smoking status: Former Games developer  . Smokeless tobacco: Never Used  Vaping Use  . Vaping Use: Never used  Substance and Sexual Activity  . Alcohol use: No  . Drug use: Never  . Sexual activity: Not on file  Other Topics Concern  . Not on file  Social History Narrative  . Not on file   Social Determinants of Health   Financial Resource Strain: Not on file  Food Insecurity: Not on file  Transportation  Needs: Not on file  Physical Activity: Not on file  Stress: Not on file  Social Connections: Not on file  Intimate Partner Violence: Not on file    Family History:   Patient did not provided family history  ROS:  Please see the history of present illness.  All other ROS reviewed and negative.     Physical Exam/Data:   Vitals:   08/06/20 1230 08/06/20 1245 08/06/20 1300 08/06/20 1315  BP: (!) 147/87 (!) 139/93 (!) 161/105   Pulse: 68 69 (!) 46 69  Resp: (!) 23 17 10 18   Temp:      TempSrc:      SpO2: 96% 95% 93% 100%  Weight:      Height:       No intake or output data in the 24 hours ending 08/06/20 1406 Last 3 Weights 08/06/2020 05/18/2020 05/18/2020  Weight (lbs) 260 lb 241 lb 6.5 oz 241 lb 6.5 oz  Weight (kg) 117.935 kg 109.5 kg 109.5 kg     Body mass index is 36.26 kg/m.  General:  Well nourished, well developed, in no acute distress HEENT: normal Lymph: no adenopathy Neck: no JVD Endocrine:  No thryomegaly Vascular: No carotid bruits; FA pulses 2+ bilaterally without bruits  Cardiac:  normal S1, S2; irregular; no murmur Lungs:  clear to auscultation bilaterally, no wheezing, rhonchi or rales  Abd: soft, nontender, no hepatomegaly  Ext: no edema Musculoskeletal:  No deformities, BUE and BLE strength normal and equal Skin: warm and dry  Neuro:  CNs 2-12 intact, no focal abnormalities noted Psych:  Normal affect   EKG:  The EKG was personally reviewed and demonstrates:  Atrial fibrillation/flutter at rate of 73 bpm (read as sinus or ectopic) Telemetry:  Telemetry was personally reviewed and demonstrates: Atrial fibrillation/flutter at rate of 70 to 90s  Relevant CV Studies: Monitor 04/2020 Patient monitored for 48 hours. Analysis and rhythm strips revealed persistent atrial flutter/fibrillation at an average rate of 101 bpm. Minimum heart rate 63 bpm. Maximum heart rate 137 bpm. Rare premature ventricular contractions noted. No complex ventricular ectopy  noted. There were no pauses greater than 3 seconds in duration  Echo 03/2020 1. Left ventricular ejection fraction, by estimation, is 50 to 55%. The  left ventricle has low normal function. The left ventricle demonstrates  global hypokinesis. Left ventricular diastolic function could not be  evaluated.  2. Right ventricular systolic function is normal. The right ventricular  size is normal. There is normal pulmonary artery systolic pressure.  3. Right atrial size was mildly dilated.  4. The mitral valve is grossly normal. Mild to moderate mitral valve  regurgitation. No evidence of mitral stenosis.  5. Tricuspid valve regurgitation is moderate.  6. The aortic valve is tricuspid. There is mild thickening of the aortic  valve. Aortic valve regurgitation is not visualized. No aortic stenosis is  present.   Laboratory Data:  High Sensitivity Troponin:   Recent Labs  Lab 08/06/20 0900 08/06/20 1126  TROPONINIHS 48* 36*     Chemistry Recent Labs  Lab 08/06/20 0900  NA 138  K 3.9  CL 102  CO2 23  GLUCOSE 123*  BUN 11  CREATININE 1.23  CALCIUM 9.0  GFRNONAA 58*  ANIONGAP 13    Recent Labs  Lab 08/06/20 0900  PROT 7.8  ALBUMIN 3.4*  AST 24  ALT 19  ALKPHOS 59  BILITOT 1.1   Hematology Recent Labs  Lab 08/06/20 0900  WBC 3.0*  RBC 4.98  HGB 15.6  HCT 45.9  MCV 92.2  MCH 31.3  MCHC 34.0  RDW 14.3  PLT 186   BNP Recent Labs  Lab 08/06/20 0900  BNP 38.1   Radiology/Studies:  CT Head Wo Contrast  Result Date: 08/06/2020 CLINICAL DATA:  Dizziness, multiple falls. EXAM: CT HEAD WITHOUT CONTRAST TECHNIQUE: Contiguous axial images were obtained from the base of the skull through the vertex without intravenous contrast. COMPARISON:  None. FINDINGS: Brain: Mild chronic ischemic white matter disease is noted. Mild diffuse cortical atrophy is noted. No mass effect or midline shift is noted. Ventricular size is within normal limits. There is no evidence of mass  lesion, hemorrhage or acute infarction. Vascular: No hyperdense vessel or unexpected calcification. Skull: Normal. Negative for fracture or focal lesion. Sinuses/Orbits: No acute finding. Other: None. IMPRESSION: Mild chronic ischemic white matter disease. Mild diffuse cortical atrophy. No acute intracranial abnormality seen. Electronically Signed   By: Lupita Raider M.D.   On: 08/06/2020 10:00   DG Chest Port 1 View  Result Date: 08/06/2020 CLINICAL DATA:  Chest pain EXAM: PORTABLE CHEST 1 VIEW COMPARISON:  05/17/2020 FINDINGS: 0854 hours. The lungs are clear without focal pneumonia, edema, pneumothorax or pleural effusion. The cardiopericardial silhouette is within normal limits for size. The visualized bony structures of the thorax show no acute abnormality. Telemetry leads overlie the chest. IMPRESSION: No active disease. Electronically Signed   By: Kennith Center M.D.   On: 08/06/2020 09:28   Assessment and Plan:   1.  Dizziness/lightheadedness -Patient with intermittent dizziness since Monday.  He is a poor historian.  Seems no LOC. -He was noted in atrial fibrillation/flutter at heart rate of 170s upon EMS arrival -On IV Lopressor here with improved rate -He has associated blurred vision and headache>> other consideration include vertigo  2.  Persistent atrial fibrillation/flutter -Longstanding history.  Patient reports compliance with Lopressor 50 mg twice daily. -His symptoms could be due to symptomatic atrial fibrillation/flutter whenever his heart rate is high -Patient is very poor historian -Previously recommended anticoagulation by Dr. Jens Som but never placed on due to iron deficiency anemia.  In past he has received iron infusion (last in 04/2020) and seen by hematology at [facility].  -Denies bleeding issue currently.  Hemoglobin stable. -Reviewed plan with MD.  3.  Iron deficiency anemia -Previously received iron transfusion and seen by hematology -Hemoglobin stable.  4.   Chest pain and elevated troponin  -Previously recommended stress test but never happened -He had chest pain this morning with his symptoms,  currently resolved. - Hs- troponin 48>>36 -EKG with non specific changes  5. Elevated blood pressure -No prior history of hypertension -His blood pressure was normal on arrival now gradually increasing. Most recent reading 190/95.   Dr. Delton See to see.   Risk Assessment/Risk Scores:   CHA2DS2-VASc Score = 4  This indicates a 4.8% annual risk of stroke. The patient's score is based upon: CHF History: Yes HTN History:  No Diabetes History: No Stroke History: No Vascular Disease History: Yes Age Score: 2 Gender Score: 0  For questions or updates, please contact [facility] Please consult [email or web address] for contact info under   Lorelei Pont, PA  08/06/2020 2:06 PM   The patient was seen, examined and discussed with Bhagat,Bhavinkumar PA-C and I agree with the above.   86 y.o. male with a hx of chronic atrial fibrillation/flutter, not on chronic anticoagulation 2nd to iron deficiency anemia requiring iron transfusion x2, coronary artery calcification and obesity who is being seen today for the evaluation of dizziness and episodes of presyncope.  Admitted 03/2020 with afib RVR.  Echocardiogram obtained on 03/12/2020 showed EF 50 to 55%, global hypokinesis, normal pulmonary systolic pressure, mild to moderate MR, normal left atrial size and mildly dilated right atrial size.  [facility] 12/2019 showed normal colonoscopy and EGD.   Dr. Jens Som recommended a trial of Eliquis for 4 week and outpatient cardioversion however he was not discharge on anticoagulation.  Last seen by Dr. Alysia Penna 03/2020 and recommended stress test but does not seems its done. His Toprol increased and placed on ASA but no anticoagulation. 48 hour monitor 04/2020 showed persistent atrial fluter/fibrillation at average rate of 101 bpm. The  patient states that he was taking his meds.  He presented for lightheadedness and dizziness, started on Monday, feels like room is spinning.He had two episodes of near falls this morning but no loss of consciousness. On arrival in a-flutter with RVR with ventricular rate 160 BPM, currently in atrial flutter with 4:1 block and venmtricular rates 70 BPM after he was Given IV lopressor 12.5mg  x 1 with Hr in 70-90s. He continues to feel dizzy.   On physical exam he has no JVDs, S1-S2 regular with 2 out of 6 systolic murmur, his lungs are clear to auscultation he is warm extremities with no edema. He was hypertensive at  190s/100s mmHg.  Atrial flutter with RVR Vertigo  His symptoms of dizziness seem to be related to vertigo, he continues to be dizzy even after his heart rate was slowed down to 70 bpm.  Regarding his atrial flutter with RVR, his history is highly suspicious for noncompliance, he follows with two cardiologists but does not follow any recommendation with regards to use of anticoagulation or having stress test done. Because of that he is not a great candidate for advanced therapies unless he shows that he can present to the clinic and follow up and be compliant with his anticoagulation.  It is also questionable if he takes his medicines at home since he responded so well to very low-dose of IV Lopressor, I would switch metoprolol to carvedilol 12.5 mg PO BID, I will follow him in the morning and if he is still hypertensive and tachycardic, I will consider adding long acting cardizem. He should be on anticoagulation if he agrees to it and is able to follow. Most recent Hb 15.6.   Tobias Alexander, MD 08/06/2020

## 2020-08-06 NOTE — Progress Notes (Signed)
ANTICOAGULATION CONSULT NOTE - Initial Consult  Pharmacy Consult for heparin Indication: atrial fibrillation  Allergies  Allergen Reactions  . Iodine Hives, Itching and Rash        . Ferrous Ammonium Sulfate Hives  . Shellfish Allergy Hives    Patient Measurements: Height: 5\' 11"  (180.3 cm) Weight: 117.9 kg (260 lb) IBW/kg (Calculated) : 75.3 Heparin Dosing Weight: 101.3  Vital Signs: Temp: 98.3 F (36.8 C) (02/04 1618) Temp Source: Oral (02/04 1618) BP: 124/60 (02/04 1900) Pulse Rate: 71 (02/04 1900)  Labs: Recent Labs    08/06/20 0900 08/06/20 1126  HGB 15.6  --   HCT 45.9  --   PLT 186  --   CREATININE 1.23  --   TROPONINIHS 48* 36*    Estimated Creatinine Clearance: 59.4 mL/min (by C-G formula based on SCr of 1.23 mg/dL).   Medical History: Past Medical History:  Diagnosis Date  . Atrial fibrillation (HCC)   . Coronary artery disease     Medications:  Scheduled:  . carvedilol  12.5 mg Oral BID WC  . heparin  4,000 Units Intravenous Once    Assessment: 86 yo male admitted for CP/syncope.  Hx of chronic atrial fibrillation/flutter NOT on chronic anticoagulation due to iron deficiency anemia requiring transfusion x2.  Follows w/ heme/onc.  Was recommended to trial Eliquis x 4 weeks on last admission however it was not started.  CBC on admit stable.  Pharmacy consulted to dose heparin.  Goal of Therapy:  Heparin level 0.3-0.7 units/ml Monitor platelets by anticoagulation protocol: Yes   Plan:  Will give slightly lower bolus at 4000 units (~39 mg/kg) of heparin given hx of iron deficiency anemia requiring transfusion Initiate heparin drip at 1500 units/hr F/u 8 hour HL Monitor s/sx bleeding, daily CBC F/u oral AC plans  91, PharmD PGY-1 Acute Care Pharmacy Resident Office: [phone] 08/06/2020 7:34 PM

## 2020-08-06 NOTE — ED Notes (Signed)
[phone] daughter Tristan Gardner would like an update

## 2020-08-06 NOTE — ED Provider Notes (Signed)
[facility] EMERGENCY DEPARTMENT Provider Note   CSN: [id number] Arrival date & time: 08/06/20  0830     History Chief Complaint  Patient presents with  . Chest Pain  . Irregular Heart Beat  . Near Syncope    Tristan Gardner is a 86 y.o. male.  86 yo M with a chief complaint of feeling lightheaded and dizzy.  This happens off and on.  He feels like he loses his vision and everything goes white.  He has collapsed to the ground at least 3 times in the past 48 hours with the same.  Feels like the room is spinning just prior to having the change in his vision.  Usually resolves quickly.  He did bump his head this morning which worried him and so he called 911.  Found to be in atrial fibrillation with rates up into the 170s and brought to the ED for evaluation.  He is also been having shortness of breath on exertion for some time.  Tells me that he cannot walk down the street or up a flight of stairs without becoming severely short of breath.  He thinks that that is somewhat better since last time he is in the hospital the worsening over the past couple days.  Having some chest pain that radiates into his right arm over the past 24 hours or so off and on.  Seems to come and go at random.  He denies cough or congestion.  Denies fevers.  Denies abdominal pain.  Eating and drinking normally.  Denies nausea vomiting or diarrhea.  The history is provided by the patient.  Chest Pain Pain location:  Substernal area Associated symptoms: near-syncope   Associated symptoms: no abdominal pain, no fever, no headache, no palpitations, no shortness of breath and no vomiting   Near Syncope Associated symptoms include chest pain. Pertinent negatives include no abdominal pain, no headaches and no shortness of breath.       Past Medical History:  Diagnosis Date  . Atrial fibrillation (HCC)   . Coronary artery disease     Patient Active Problem List   Diagnosis Date Noted  . Atrial  flutter (HCC) 05/18/2020  . Viral URI with cough 05/18/2020  . Atypical chest pain 05/17/2020  . Atrial fibrillation (HCC) 03/11/2020    Past Surgical History:  Procedure Laterality Date  . CARDIAC SURGERY    . ROTATOR CUFF REPAIR         No family history on file.  Social History   Tobacco Use  . Smoking status: Former Games developer  . Smokeless tobacco: Never Used  Vaping Use  . Vaping Use: Never used  Substance Use Topics  . Alcohol use: No  . Drug use: Never    Home Medications Prior to Admission medications   Medication Sig Start Date End Date Taking? Authorizing Provider  acetaminophen (TYLENOL) 500 MG tablet Take 1,000 mg by mouth every 6 (six) hours as needed for mild pain.   Yes [provider]  albuterol (VENTOLIN HFA) 108 (90 Base) MCG/ACT inhaler Inhale 2 puffs into the lungs every 4 (four) hours as needed for wheezing or shortness of breath. 02/24/20  Yes [provider]  aspirin EC 81 MG tablet Take 81 mg by mouth daily. Swallow whole.   Yes [provider]  furosemide (LASIX) 40 MG tablet Take 0.5 tablets (20 mg total) by mouth daily. 05/18/20  Yes Theotis Barrio, MD  gabapentin (NEURONTIN) 100 MG capsule Take 200 mg  by mouth 3 (three) times daily.   Yes [provider]  iron polysaccharides (NIFEREX) 150 MG capsule Take 1 capsule (150 mg total) by mouth daily. 05/18/20  Yes Theotis Barrio, MD  metoprolol tartrate (LOPRESSOR) 50 MG tablet Take 1 tablet (50 mg total) by mouth 2 (two) times daily. 05/18/20  Yes Theotis Barrio, MD  baclofen (LIORESAL) 10 MG tablet Take 10 mg by mouth daily.    [provider]    Allergies    Iodine, Ferrous ammonium sulfate, and Shellfish allergy  Review of Systems   Review of Systems  Constitutional: Negative for chills and fever.  HENT: Negative for congestion and facial swelling.   Eyes: Negative for discharge and visual disturbance.  Respiratory: Negative for shortness of breath.    Cardiovascular: Positive for chest pain and near-syncope. Negative for palpitations.  Gastrointestinal: Negative for abdominal pain, diarrhea and vomiting.  Musculoskeletal: Negative for arthralgias and myalgias.  Skin: Negative for color change and rash.  Neurological: Positive for syncope. Negative for tremors and headaches.  Psychiatric/Behavioral: Negative for confusion and dysphoric mood.    Physical Exam Updated Vital Signs BP (!) 190/95   Pulse 69   Temp 98.5 F (36.9 C) (Oral)   Resp 20   Ht 5\' 11"  (1.803 m)   Wt 117.9 kg   SpO2 100%   BMI 36.26 kg/m   Physical Exam Vitals and nursing note reviewed.  Constitutional:      Appearance: He is well-developed and well-nourished.  HENT:     Head: Normocephalic and atraumatic.  Eyes:     Extraocular Movements: EOM normal.     Pupils: Pupils are equal, round, and reactive to light.  Neck:     Vascular: No JVD.  Cardiovascular:     Rate and Rhythm: Normal rate. Rhythm irregularly irregular.     Heart sounds: No murmur heard. No friction rub. No gallop.   Pulmonary:     Effort: No respiratory distress.     Breath sounds: No wheezing.  Abdominal:     General: There is no distension.     Tenderness: There is no guarding or rebound.  Musculoskeletal:        General: Normal range of motion.     Cervical back: Normal range of motion and neck supple.  Skin:    Coloration: Skin is not pale.     Findings: No rash.  Neurological:     Mental Status: He is alert and oriented to person, place, and time.  Psychiatric:        Mood and Affect: Mood and affect normal.        Behavior: Behavior normal.     ED Results / Procedures / Treatments   Labs (all labs ordered are listed, but only abnormal results are displayed) Labs Reviewed  CBC WITH DIFFERENTIAL/PLATELET - Abnormal; Notable for the following components:      Result Value   WBC 3.0 (*)    Neutro Abs 1.5 (*)    All other components within normal limits   COMPREHENSIVE METABOLIC PANEL - Abnormal; Notable for the following components:   Glucose, Bld 123 (*)    Albumin 3.4 (*)    GFR, Estimated 58 (*)    All other components within normal limits  T4, FREE - Abnormal; Notable for the following components:   Free T4 1.14 (*)    All other components within normal limits  TROPONIN I (HIGH SENSITIVITY) - Abnormal; Notable for the following components:  Troponin I (High Sensitivity) 48 (*)    All other components within normal limits  TROPONIN I (HIGH SENSITIVITY) - Abnormal; Notable for the following components:   Troponin I (High Sensitivity) 36 (*)    All other components within normal limits  BRAIN NATRIURETIC PEPTIDE  TSH    EKG EKG Interpretation  Date/Time:  Friday August 06 2020 08:42:53 EST Ventricular Rate:  74 PR Interval:    QRS Duration: 104 QT Interval:  392 QTC Calculation: 435 R Axis:   -13 Text Interpretation: Sinus or ectopic atrial rhythm Supraventricular bigeminy RSR' in V1 or V2, right VCD or RVH Nonspecific T abnormalities, inferior leads No significant change since last tracing Confirmed by Melene Plan [phone]) on 08/06/2020 8:46:24 AM   Radiology CT Head Wo Contrast  Result Date: 08/06/2020 CLINICAL DATA:  Dizziness, multiple falls. EXAM: CT HEAD WITHOUT CONTRAST TECHNIQUE: Contiguous axial images were obtained from the base of the skull through the vertex without intravenous contrast. COMPARISON:  None. FINDINGS: Brain: Mild chronic ischemic white matter disease is noted. Mild diffuse cortical atrophy is noted. No mass effect or midline shift is noted. Ventricular size is within normal limits. There is no evidence of mass lesion, hemorrhage or acute infarction. Vascular: No hyperdense vessel or unexpected calcification. Skull: Normal. Negative for fracture or focal lesion. Sinuses/Orbits: No acute finding. Other: None. IMPRESSION: Mild chronic ischemic white matter disease. Mild diffuse cortical atrophy. No acute  intracranial abnormality seen. Electronically Signed   By: Lupita Raider M.D.   On: 08/06/2020 10:00   DG Chest Port 1 View  Result Date: 08/06/2020 CLINICAL DATA:  Chest pain EXAM: PORTABLE CHEST 1 VIEW COMPARISON:  05/17/2020 FINDINGS: 0854 hours. The lungs are clear without focal pneumonia, edema, pneumothorax or pleural effusion. The cardiopericardial silhouette is within normal limits for size. The visualized bony structures of the thorax show no acute abnormality. Telemetry leads overlie the chest. IMPRESSION: No active disease. Electronically Signed   By: Kennith Center M.D.   On: 08/06/2020 09:28    Procedures Procedures   Medications Ordered in ED Medications  metoprolol tartrate (LOPRESSOR) tablet 12.5 mg (12.5 mg Oral Given 08/06/20 0857)    ED Course  I have reviewed the triage vital signs and the nursing notes.  Pertinent labs & imaging results that were available during my care of the patient were reviewed by me and considered in my medical decision making (see chart for details).    MDM Rules/Calculators/A&P                          86 yo M with a chief complaints of recurrent syncopal events.  This is happened over the past couple days.  He has a history of atrial flutter.  Has been seen by cardiology in the past.  Thought to not be a good candidate for anticoagulation due to chronic anemia and started on aspirin.  Had an admission to the hospital for exertional dyspnea was thought to be secondary to his atrial fibrillation.  Has been doing well since that last admission until the past couple days.  Rates into the 160s with EMS though on arrival heart rates are mostly in the 80s to 90s.  We will give a dose of metoprolol orally.  Lab work chest x-ray.  Is describing some chest pain that sounds somewhat atypical.  Obtain a troponin.  Patient's troponin is just mildly above what his baseline is.  With multiple syncopal events in  the past couple days we will have cardiology come  and evaluate.  Continues to be rate controlled.  Cards eval.  Signed out to Dr Particia Nearing.  Please see their note for further details of care in the ED.   The patients results and plan were reviewed and discussed.   Any x-rays performed were independently reviewed by myself.   Differential diagnosis were considered with the presenting HPI.  Medications  metoprolol tartrate (LOPRESSOR) tablet 12.5 mg (12.5 mg Oral Given 08/06/20 0857)    Vitals:   08/06/20 1315 08/06/20 1330 08/06/20 1400 08/06/20 1430  BP:  (!) 175/126 (!) 177/104 (!) 190/95  Pulse: 69 69 70 69  Resp: 18 (!) 23 19 20   Temp:      TempSrc:      SpO2: 100% 97% 97% 100%  Weight:      Height:        Final diagnoses:  Atrial flutter, unspecified type (HCC)    Admission/ observation were discussed with the admitting physician, patient and/or family and they are comfortable with the plan.   Final Clinical Impression(s) / ED Diagnoses Final diagnoses:  Atrial flutter, unspecified type [facility])    Rx / DC Orders ED Discharge Orders    None       [facility], DO 08/06/20 1543

## 2020-08-06 NOTE — H&P (Signed)
History and Physical    Tristan Gardner XIP:[id number] DOB: [birth year] DOA: 08/06/2020  PCP: Patient, No Pcp Per  Patient coming from: Home  I have personally briefly reviewed patient's old medical records in [facility]  Chief Complaint: CP  HPI: Tristan Gardner is a 86 y.o. male with medical history significant of A.Fib/flutter not on anticoagulation due to iron def anemia previously, CAD, obesity.  Pt presents to the ED with c/o intermittent episodes of lightheadedness and dizziness since Monday.  Had fall and hit back of head in bathroom this AM.  No LOC.  No palpitations but did have L sided chest discomfort which is now resolved.  Has chronic dyspnea, no CP on exertion.   ED Course: Initially A.Flutter 160, given IV lopressor and HR 70-90 on arrival.  Denies missing any meds at home.  Normal BP initially in ED but later was 190s systolic.  CP resolved at this time.  Trops 48->36.  CXR and CT head neg.  Cards saw pt, recd changing metoprolol to coreg and starting heparin gtt and re-eval in AM.   Review of Systems: As per HPI, otherwise all review of systems negative.  Past Medical History:  Diagnosis Date  . Atrial fibrillation (HCC)   . Coronary artery disease     Past Surgical History:  Procedure Laterality Date  . CARDIAC SURGERY    . ROTATOR CUFF REPAIR       reports that he has quit smoking. He has never used smokeless tobacco. He reports that he does not drink alcohol and does not use drugs.  Allergies  Allergen Reactions  . Iodine Hives, Itching and Rash        . Ferrous Ammonium Sulfate Hives  . Shellfish Allergy Hives    Family History  Problem Relation Age of Onset  . Diabetes Mother   . Heart attack Father 74  . Hypertension Brother   . Heart disease Brother   . Diabetes Brother      Prior to Admission medications   Medication Sig Start Date End Date Taking? Authorizing Provider  acetaminophen (TYLENOL) 500 MG tablet Take 1,000 mg  by mouth every 6 (six) hours as needed for mild pain.   Yes [provider]  albuterol (VENTOLIN HFA) 108 (90 Base) MCG/ACT inhaler Inhale 2 puffs into the lungs every 4 (four) hours as needed for wheezing or shortness of breath. 02/24/20  Yes [provider]  aspirin EC 81 MG tablet Take 81 mg by mouth daily. Swallow whole.   Yes [provider]  furosemide (LASIX) 40 MG tablet Take 0.5 tablets (20 mg total) by mouth daily. 05/18/20  Yes Theotis Barrio, MD  gabapentin (NEURONTIN) 100 MG capsule Take 200 mg by mouth 3 (three) times daily.   Yes [provider]  iron polysaccharides (NIFEREX) 150 MG capsule Take 1 capsule (150 mg total) by mouth daily. 05/18/20  Yes Theotis Barrio, MD  baclofen (LIORESAL) 10 MG tablet Take 10 mg by mouth daily.    [provider]    Physical Exam: Vitals:   08/06/20 1745 08/06/20 1830 08/06/20 1845 08/06/20 1900  BP: (!) 164/97 (!) 151/77 (!) 138/101 124/60  Pulse: 72 70 71 71  Resp: (!) 25 14 15 17   Temp:      TempSrc:      SpO2: 100% 99% 92% 90%  Weight:      Height:        Constitutional: NAD, calm, comfortable Eyes: PERRL, lids and  conjunctivae normal ENMT: Mucous membranes are moist. Posterior pharynx clear of any exudate or lesions.Normal dentition.  Neck: normal, supple, no masses, no thyromegaly Respiratory: clear to auscultation bilaterally, no wheezing, no crackles. Normal respiratory effort. No accessory muscle use.  Cardiovascular: Regular rate and rhythm, no murmurs / rubs / gallops. No extremity edema. 2+ pedal pulses. No carotid bruits.  Abdomen: no tenderness, no masses palpated. No hepatosplenomegaly. Bowel sounds positive.  Musculoskeletal: no clubbing / cyanosis. No joint deformity upper and lower extremities. Good ROM, no contractures. Normal muscle tone.  Skin: no rashes, lesions, ulcers. No induration Neurologic: CN 2-12 grossly intact. Sensation intact, DTR normal. Strength 5/5 in all 4.   Psychiatric: Normal judgment and insight. Alert and oriented x 3. Normal mood.    Labs on Admission: I have personally reviewed following labs and imaging studies  CBC: Recent Labs  Lab 08/06/20 0900  WBC 3.0*  NEUTROABS 1.5*  HGB 15.6  HCT 45.9  MCV 92.2  PLT 186   Basic Metabolic Panel: Recent Labs  Lab 08/06/20 0900  NA 138  K 3.9  CL 102  CO2 23  GLUCOSE 123*  BUN 11  CREATININE 1.23  CALCIUM 9.0   GFR: Estimated Creatinine Clearance: 59.4 mL/min (by C-G formula based on SCr of 1.23 mg/dL). Liver Function Tests: Recent Labs  Lab 08/06/20 0900  AST 24  ALT 19  ALKPHOS 59  BILITOT 1.1  PROT 7.8  ALBUMIN 3.4*   No results for input(s): LIPASE, AMYLASE in the last 168 hours. No results for input(s): AMMONIA in the last 168 hours. Coagulation Profile: No results for input(s): INR, PROTIME in the last 168 hours. Cardiac Enzymes: No results for input(s): CKTOTAL, CKMB, CKMBINDEX, TROPONINI in the last 168 hours. BNP (last 3 results) No results for input(s): PROBNP in the last 8760 hours. HbA1C: No results for input(s): HGBA1C in the last 72 hours. CBG: No results for input(s): GLUCAP in the last 168 hours. Lipid Profile: No results for input(s): CHOL, HDL, LDLCALC, TRIG, CHOLHDL, LDLDIRECT in the last 72 hours. Thyroid Function Tests: Recent Labs    08/06/20 0900  TSH 1.920  FREET4 1.14*   Anemia Panel: No results for input(s): VITAMINB12, FOLATE, FERRITIN, TIBC, IRON, RETICCTPCT in the last 72 hours. Urine analysis: No results found for: COLORURINE, APPEARANCEUR, LABSPEC, PHURINE, GLUCOSEU, HGBUR, BILIRUBINUR, KETONESUR, PROTEINUR, UROBILINOGEN, NITRITE, LEUKOCYTESUR  Radiological Exams on Admission: CT Head Wo Contrast  Result Date: 08/06/2020 CLINICAL DATA:  Dizziness, multiple falls. EXAM: CT HEAD WITHOUT CONTRAST TECHNIQUE: Contiguous axial images were obtained from the base of the skull through the vertex without intravenous contrast.  COMPARISON:  None. FINDINGS: Brain: Mild chronic ischemic white matter disease is noted. Mild diffuse cortical atrophy is noted. No mass effect or midline shift is noted. Ventricular size is within normal limits. There is no evidence of mass lesion, hemorrhage or acute infarction. Vascular: No hyperdense vessel or unexpected calcification. Skull: Normal. Negative for fracture or focal lesion. Sinuses/Orbits: No acute finding. Other: None. IMPRESSION: Mild chronic ischemic white matter disease. Mild diffuse cortical atrophy. No acute intracranial abnormality seen. Electronically Signed   By: Lupita Raider M.D.   On: 08/06/2020 10:00   DG Chest Port 1 View  Result Date: 08/06/2020 CLINICAL DATA:  Chest pain EXAM: PORTABLE CHEST 1 VIEW COMPARISON:  05/17/2020 FINDINGS: 0854 hours. The lungs are clear without focal pneumonia, edema, pneumothorax or pleural effusion. The cardiopericardial silhouette is within normal limits for size. The visualized bony structures of the thorax  show no acute abnormality. Telemetry leads overlie the chest. IMPRESSION: No active disease. Electronically Signed   By: Kennith Center M.D.   On: 08/06/2020 09:28    EKG: Independently reviewed.  Assessment/Plan Principal Problem:   Atrial flutter (HCC) Active Problems:   Chest pain, rule out acute myocardial infarction   Elevated blood pressure reading   H/O iron deficiency anemia    1. CP r/o - 1. CP free for the moment 2. CP obs pathway 3. Tele monitor 4. Will keep NPO in case cards wants to do the stress test in AM 2. A.Flutter - 1. Switched lopressor to [employer] 2. Started heparin gtt 3. See cards consult note 3. H/o Iron def anemia - 1. Improved after IV iron infusions, HGB 15 now 2. Repeat CBC in AM 4. Elevated BP reading - 1. No prior h/o HTN 2. Currently 124/60 in ED this evening after coreg  DVT prophylaxis: heparin gtt Code Status: Full Family Communication: No family in room Disposition Plan: Home  after cleared by cards Consults called: See cards consult note Admission status: Place in obs    Tristan Gardner M. DO [facility]  How to contact the [facility] Attending or Consulting provider 7A - 7P or covering provider during after hours 7P -7A, for this patient?  1. Check the care team in [facility] and look for a) attending/consulting [facility] provider listed and b) the [facility] team listed 2. Log into [email or web address]  Amion Physician Scheduling and messaging for groups and whole hospitals  On call and physician scheduling software for group practices, residents, hospitalists and other medical providers for call, clinic, rotation and shift schedules. On[employer] is a hospital-wide system for scheduling doctors and paging doctors on call. EasyPlot is for scientific plotting and data analysis.  [email or web address]  and use [place]'s universal password to access. If you do not have the password, please contact the hospital operator.  3. Locate the [facility] provider you are looking for under [facility] and page to a number that you can be directly reached. 4. If you still have difficulty reaching the provider, please page the [facility] (Director on Call) for the Hospitalists listed on amion for assistance.  08/06/2020, 8:47 PM

## 2020-08-06 NOTE — ED Provider Notes (Signed)
Pt signed out by Dr. Adela Lank awaiting Dr. Lindaann Slough (cardiology) recommendation.  She suspects noncompliance with his meds.  She recommends switching patient to carvedilol 12.5 mg po bid and start him on anticoagulation.  She will see in the morning.  Pt d/w Dr. Julian Reil ([facility]) for admission.    Jacalyn Lefevre, MD 08/06/20 2046

## 2020-08-07 DIAGNOSIS — I4892 Unspecified atrial flutter: Secondary | ICD-10-CM | POA: Diagnosis not present

## 2020-08-07 DIAGNOSIS — R079 Chest pain, unspecified: Secondary | ICD-10-CM | POA: Diagnosis not present

## 2020-08-07 DIAGNOSIS — H8110 Benign paroxysmal vertigo, unspecified ear: Secondary | ICD-10-CM

## 2020-08-07 DIAGNOSIS — R5381 Other malaise: Secondary | ICD-10-CM

## 2020-08-07 DIAGNOSIS — Z862 Personal history of diseases of the blood and blood-forming organs and certain disorders involving the immune mechanism: Secondary | ICD-10-CM | POA: Diagnosis not present

## 2020-08-07 DIAGNOSIS — I483 Typical atrial flutter: Secondary | ICD-10-CM | POA: Diagnosis not present

## 2020-08-07 DIAGNOSIS — I10 Essential (primary) hypertension: Secondary | ICD-10-CM

## 2020-08-07 DIAGNOSIS — R7989 Other specified abnormal findings of blood chemistry: Secondary | ICD-10-CM

## 2020-08-07 DIAGNOSIS — R030 Elevated blood-pressure reading, without diagnosis of hypertension: Secondary | ICD-10-CM

## 2020-08-07 LAB — CBC
HCT: 43.1 % (ref 39.0–52.0)
Hemoglobin: 14.8 g/dL (ref 13.0–17.0)
MCH: 31.2 pg (ref 26.0–34.0)
MCHC: 34.3 g/dL (ref 30.0–36.0)
MCV: 90.9 fL (ref 80.0–100.0)
Platelets: 162 10*3/uL (ref 150–400)
RBC: 4.74 MIL/uL (ref 4.22–5.81)
RDW: 14.4 % (ref 11.5–15.5)
WBC: 3.5 10*3/uL — ABNORMAL LOW (ref 4.0–10.5)
nRBC: 0.6 % — ABNORMAL HIGH (ref 0.0–0.2)

## 2020-08-07 LAB — HEPARIN LEVEL (UNFRACTIONATED)
Heparin Unfractionated: 0.2 IU/mL — ABNORMAL LOW (ref 0.30–0.70)
Heparin Unfractionated: <0.1 IU/mL — ABNORMAL LOW (ref 0.30–0.70)

## 2020-08-07 LAB — SARS CORONAVIRUS 2 (TAT 6-24 HRS): SARS Coronavirus 2: POSITIVE — AB

## 2020-08-07 MED ORDER — ENOXAPARIN SODIUM 40 MG/0.4ML ~~LOC~~ SOLN
40.0000 mg | SUBCUTANEOUS | Status: DC
  Administered 2020-08-07: 40 mg via SUBCUTANEOUS
  Filled 2020-08-07: qty 0.4

## 2020-08-07 MED ORDER — CARVEDILOL 12.5 MG PO TABS: 12.5000 mg | ORAL_TABLET | Freq: Two times a day (BID) | ORAL | 0 refills | Status: DC

## 2020-08-07 NOTE — Progress Notes (Addendum)
PROGRESS NOTE  Tristan Gardner VPX:[id number] DOB: 1935/03/09   PCP: Patient, No Pcp Per  Patient is from: Home. Lives with family. Uses walker at baseline.  DOA: 08/06/2020 LOS: 0  Chief complaints: Dizziness  Brief Narrative / Interim history: 86 year old male with history of A. fib/A flutter not on AC due to IDA, CAD, obesity and debility with walker dependence presented to ED with dizziness that he describes as spinning, and found to be in A-flutter with RVR. RVR resolved after a push of IV Lopressor in ED. Cardiology consulted and felt RVR to be due to noncompliance. He was restarted on Coreg 12.5 mg twice daily, and admitted for observation. He tested positive for COVID-19 but didn't have respiratory symptoms. He says he is fully vaccinated including booster.  The next day, vertigo resolved. Cleared for discharge by cardiology. However, patient noted to have BPPV when evaluated by therapy prior to discharge. He felt dizzy, both vertigo and lightheadedness. Orthostatic vitals were negative. Discharge order canceled for overnight observation.   Subjective: Seen and examined earlier this morning. No major events overnight of this morning. No complaints. He denies chest pain, dyspnea, headache, vision change, focal numbness or tingling, nausea, vomiting, abdominal pain or UTI symptoms. He thinks his dizziness has resolved but hasn't gotten out of the bed yet. He just woke up. He wants to eat.  Objective: Vitals:   08/07/20 0629 08/07/20 0816 08/07/20 1324 08/07/20 1612  BP: (!) 129/97 (!) 118/98 122/76 94/80  Pulse: 70 70 72 73  Resp: 20 18 19    Temp: 98.2 F (36.8 C) 98.9 F (37.2 C) 97.9 F (36.6 C)   TempSrc: Oral Oral Oral   SpO2: 95%     Weight:      Height:        Intake/Output Summary (Last 24 hours) at 08/07/2020 1654 Last data filed at 08/07/2020 1544 Gross per 24 hour  Intake 240 ml  Output 700 ml  Net -460 ml   Filed Weights   08/06/20 0845  Weight: 117.9 kg     Examination:  GENERAL: No apparent distress.  Nontoxic. HEENT: MMM.  Vision and hearing grossly intact.  NECK: Supple.  No apparent JVD.  RESP: On RA. No IWOB.  Fair aeration bilaterally. CVS:  RRR. Heart sounds normal.  ABD/GI/GU: BS+. Abd soft, NTND.  MSK/EXT:  Moves extremities. No apparent deformity. No edema.  SKIN: no apparent skin lesion or wound NEURO: Awake, alert and oriented appropriately. CN II-XII intact. Motor, sensory, reflexes and finger-to-nose intact. No nystagmus PSYCH: Calm. Normal affect.   Procedures:  None  Microbiology summarized: COVID-19 PCR positive.  Assessment & Plan: Fall at home-likely due to vertigo. Reportedly hit back of his head but no LOC. CT head without acute finding. Lightheadedness/dizziness/BPPV -Orthostatic vitals negative. -Continue telemetry monitoring -Continue PT/OT  Atrial flutter/A. fib with RVR-some concern for noncompliance with medications. Could also be provoked by Covid infection. RVR aborted after a push of IV Lopressor in ED. Has been rate controlled since then. TTE 03/2020 with LVEF of 50 to 55%, GH and moderate TVR. -Cleared for discharge by cardiology on Coreg 12.5 mg twice daily -Has not been on AC due to iron deficiency anemia. -Defer AC to his primary cardiologist  History of iron deficiency anemia-followed by [facility] hematology. Reportedly had negative EGD and colonoscopy but I have no access to those results. Hgb is currently 15>> 14.8.  Elevated troponin: Likely demand ischemia in the setting of RVR. -No further cardiology work-up  Essential hypertension:  Fluctuating BP. -Continue Coreg 12.5 mg twice daily  COVID-19 infection-incidental. He states he is fully vaccinated including booster. No respiratory symptoms. His dizziness could be from BPPV versus COVID-19 -Airborne precaution -No treatment.  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 -PT/OT  Class II obesity Body mass index is 36.26 kg/m.   -Encourage lifestyle change to lose weight.       DVT prophylaxis:  enoxaparin (LOVENOX) injection 40 mg Start: 08/07/20 1745  Code Status: Full code Family Communication: Updated patient's daughter over the phone. Attempted to call patient's granddaughter, [name] but no answer twice.   Addendum Gean Quint over the phone  Level of care: Telemetry Cardiac Status is: Observation  The patient remains OBS appropriate and will d/c before 2 midnights.  Dispo: The patient is from: Home              Anticipated d/c is to: Home              Anticipated d/c date is: 1 day              Patient currently is not medically stable to d/c.   Difficult to place patient No       Consultants:  Cardiology   Sch Meds:  Scheduled Meds: . aspirin EC  81 mg Oral Daily  . carvedilol  12.5 mg Oral BID WC  . enoxaparin (LOVENOX) injection  40 mg Subcutaneous Q24H  . gabapentin  200 mg Oral TID  . iron polysaccharides  150 mg Oral Daily   Continuous Infusions: PRN Meds:.acetaminophen, albuterol, ondansetron (ZOFRAN) IV  Antimicrobials: Anti-infectives (From admission, onward)   None       I have personally reviewed the following labs and images: CBC: Recent Labs  Lab 08/06/20 0900 08/07/20 0443  WBC 3.0* 3.5*  NEUTROABS 1.5*  --   HGB 15.6 14.8  HCT 45.9 43.1  MCV 92.2 90.9  PLT 186 162   BMP &GFR Recent Labs  Lab 08/06/20 0900  NA 138  K 3.9  CL 102  CO2 23  GLUCOSE 123*  BUN 11  CREATININE 1.23  CALCIUM 9.0   Estimated Creatinine Clearance: 59.4 mL/min (by C-G formula based on SCr of 1.23 mg/dL). Liver & Pancreas: Recent Labs  Lab 08/06/20 0900  AST 24  ALT 19  ALKPHOS 59  BILITOT 1.1  PROT 7.8  ALBUMIN 3.4*   No results for input(s): LIPASE, AMYLASE in the last 168 hours. No results for input(s): AMMONIA in the last 168 hours. Diabetic: No results for input(s): HGBA1C in the last 72 hours. No results for input(s): GLUCAP in the last 168  hours. Cardiac Enzymes: No results for input(s): CKTOTAL, CKMB, CKMBINDEX, TROPONINI in the last 168 hours. No results for input(s): PROBNP in the last 8760 hours. Coagulation Profile: No results for input(s): INR, PROTIME in the last 168 hours. Thyroid Function Tests: Recent Labs    08/06/20 0900  TSH 1.920  FREET4 1.14*   Lipid Profile: No results for input(s): CHOL, HDL, LDLCALC, TRIG, CHOLHDL, LDLDIRECT in the last 72 hours. Anemia Panel: No results for input(s): VITAMINB12, FOLATE, FERRITIN, TIBC, IRON, RETICCTPCT in the last 72 hours. Urine analysis: No results found for: COLORURINE, APPEARANCEUR, LABSPEC, PHURINE, GLUCOSEU, HGBUR, BILIRUBINUR, KETONESUR, PROTEINUR, UROBILINOGEN, NITRITE, LEUKOCYTESUR Sepsis Labs: Invalid input(s): PROCALCITONIN, LACTICIDVEN  Microbiology: Recent Results (from the past 240 hour(s))  SARS CORONAVIRUS 2 (TAT 6-24 HRS) Nasopharyngeal Nasopharyngeal Swab     Status: Abnormal   Collection Time: 08/06/20 10:56 PM   Specimen: Nasopharyngeal  Swab  Result Value Ref Range Status   SARS Coronavirus 2 POSITIVE (A) NEGATIVE Final    Comment: (NOTE) SARS-CoV-2 target nucleic acids are DETECTED.  The SARS-CoV-2 RNA is generally detectable in upper and lower respiratory specimens during the acute phase of infection. Positive results are indicative of the presence of SARS-CoV-2 RNA. Clinical correlation with patient history and other diagnostic information is  necessary to determine patient infection status. Positive results do not rule out bacterial infection or co-infection with other viruses.  The expected result is Negative.  Fact Sheet for Patients: HairSlick.no  Fact Sheet for Healthcare Providers: quierodirigir.com  This test is not yet approved or cleared by the [place] FDA and  has been authorized for detection and/or diagnosis of SARS-CoV-2 by FDA under an Emergency Use  Authorization (EUA). This EUA will remain  in effect (meaning this test can be used) for the duration of the COVID-19 declaration under Section 564(b)(1) of the Act, 21 U. S.C. section 360bbb-3(b)(1), unless the authorization is terminated or revoked sooner.   Performed at [facility], [address] [place], [place] [address]     Radiology Studies: No results found.    Massimo Hartland T. Betzayda Braxton [facility]  If 7PM-7AM, please contact night-coverage [email or web address] 08/07/2020, 4:54 PM

## 2020-08-07 NOTE — Care Management Obs Status (Signed)
MEDICARE OBSERVATION STATUS NOTIFICATION   Patient Details  Name: Tristan Gardner MRN: [id number] Date of Birth: 1934/12/20   Medicare Observation Status Notification Given:  Yes    Lawerance Sabal, RN 08/07/2020, 5:33 PM

## 2020-08-07 NOTE — Evaluation (Signed)
Occupational Therapy Evaluation Patient Details Name: Tristan Gardner MRN: [id number] DOB: Sep 25, 1934 Today's Date: 08/07/2020    History of Present Illness 86 y.o. male with medical history significant of A.Fib/flutter not on anticoagulation due to iron def anemia previously, CAD, obesity.  C/o intermittent episodes of lightheadedness and dizziness since Monday.  Had a fall and hit the back of his head in bathroom.  No LOC.   Clinical Impression   Patient admitted with the diagnosis above.  He is close to, if not at, his prior level of function for basic ADL and toileting skills.  He needed supervision for the initial stand, but was Mod I to walk to the bathroom.  He walks with a slowed pace, hunched position, and limp due to L chronic hip/back pain.  His primary complaint is continued dizziness, patient verbalized dizziness with rolling from one side to the other, and with supine to sit.  BP's monitored:  Supine 140/76 Sit 144/99 Stand after using BR 163/103 Patient is hoping to return home today, OT will continue to follow in the acute setting if he remains.  No OT follow up post acute.  ? PT for vestibular eval.      Follow Up Recommendations  No OT follow up    Equipment Recommendations  None recommended by OT    Recommendations for Other Services       Precautions / Restrictions Precautions Precautions: Fall Restrictions Weight Bearing Restrictions: No      Mobility Bed Mobility Overal bed mobility: Modified Independent             General bed mobility comments: needed SR's and extra time.    Transfers Overall transfer level: Needs assistance Equipment used: Rolling walker (2 wheeled) Transfers: Sit to/from Stand Sit to Stand: Supervision         General transfer comment: continues to complain of dizziness.  slight wobble, but no LOB.    Balance Overall balance assessment: Needs assistance Sitting-balance support: Feet supported Sitting balance-Leahy  Scale: Good     Standing balance support: Bilateral upper extremity supported Standing balance-Leahy Scale: Poor Standing balance comment: needs RW for support                           ADL either performed or assessed with clinical judgement   ADL Overall ADL's : At baseline                                       General ADL Comments: Patient able to place socks seated, RW to bathroom to urinate with no assist, and washed hands/face standing sink side with no assist.  Able to donn hospital gown like a jacket seated.     Vision Baseline Vision/History: Wears glasses Patient Visual Report: No change from baseline       Perception     Praxis      Pertinent Vitals/Pain Pain Assessment: Faces Faces Pain Scale: Hurts a little bit Pain Location: L hip Pain Descriptors / Indicators: Aching Pain Intervention(s): Monitored during session     Hand Dominance Right   Extremity/Trunk Assessment Upper Extremity Assessment Upper Extremity Assessment: Overall WFL for tasks assessed   Lower Extremity Assessment Lower Extremity Assessment: Defer to PT evaluation   Cervical / Trunk Assessment Cervical / Trunk Assessment: Kyphotic   Communication Communication Communication: HOH   Cognition Arousal/Alertness: Awake/alert Behavior During  Therapy: WFL for tasks assessed/performed Overall Cognitive Status: Within Functional Limits for tasks assessed                                                Shoulder Instructions      Home Living Family/patient expects to be discharged to:: Private residence Living Arrangements: Children;Other relatives (Daughter and Granddaughter) Available Help at Discharge: Family;Available PRN/intermittently Type of Home: House Home Access: Level entry     Home Layout: Two level;Able to live on main level with bedroom/bathroom     Bathroom Shower/Tub: Tub/shower unit;Curtain   Bathroom Toilet:  Standard Bathroom Accessibility: Yes How Accessible: Accessible via walker Home Equipment: Hand held shower head;Tub bench;Walker - 4 wheels;Cane - single point          Prior Functioning/Environment Level of Independence: Needs assistance  Gait / Transfers Assistance Needed: limited community ambulation with cane or Rollator , uses scooter in stores.  Uses his 4WRW in the houe and SPC to the care. ADL's / Homemaking Assistance Needed: Patient's daughter completed majority of home managment and meal prep.  Patient stated he could assist a little.  Chronic back issues and L hip.  Patient is able to bathe and dress himself with increased time.  Daughter will assist with socks and shoes on occasion.            OT Problem List: Pain      OT Treatment/Interventions:      OT Goals(Current goals can be found in the care plan section) Acute Rehab OT Goals Patient Stated Goal: Hope that dizziness goes away OT Goal Formulation: With patient Time For Goal Achievement: 08/21/20 Potential to Achieve Goals: Fair  OT Frequency:     Barriers to D/C:  none noted          Co-evaluation              AM-PAC OT "6 Clicks" Daily Activity     Outcome Measure Help from another person eating meals?: None Help from another person taking care of personal grooming?: None Help from another person toileting, which includes using toliet, bedpan, or urinal?: None Help from another person bathing (including washing, rinsing, drying)?: A Little Help from another person to put on and taking off regular upper body clothing?: None Help from another person to put on and taking off regular lower body clothing?: None 6 Click Score: 23   End of Session Equipment Utilized During Treatment: Rolling walker Nurse Communication: Mobility status  Activity Tolerance: Patient tolerated treatment well Patient left: Other (comment) (sitting edge of bed waiting for lunch tray)  OT Visit Diagnosis: Dizziness  and giddiness (R42)                Time: 6606-0045 OT Time Calculation (min): 23 min Charges:  OT General Charges $OT Visit: 1 Visit OT Evaluation $OT Eval Moderate Complexity: 1 Mod OT Treatments $Self Care/Home Management : 8-22 mins  08/07/2020  Rich, OTR/L  Acute Rehabilitation Services  Office:  [phone]   Tristan Gardner 08/07/2020, 1:19 PM

## 2020-08-07 NOTE — Discharge Instructions (Signed)
Benign Positional Vertigo Vertigo is the feeling that you or your surroundings are moving when they are not. Benign positional vertigo is the most common form of vertigo. This is usually a harmless condition (benign). This condition is positional. This means that symptoms are triggered by certain movements and positions. This condition can be dangerous if it occurs while you are doing something that could cause harm to you or others. This includes activities such as driving or operating machinery. What are the causes? The inner ear has fluid-filled canals that help your brain sense movement and balance. When the fluid moves, the brain receives messages about your body's position. With benign positional vertigo, crystals in the inner ear break free and disturb the inner ear area. This causes your brain to receive confusing messages about your body's position. What increases the risk? You are more likely to develop this condition if:  You are a woman.  You are 50 years of age or older.  You have recently had a head injury.  You have an inner ear disease. What are the signs or symptoms? Symptoms of this condition usually happen when you move your head or your eyes in different directions. Symptoms may start suddenly, and usually last for less than a minute. They include:  Loss of balance and falling.  Feeling like you are spinning or moving.  Feeling like your surroundings are spinning or moving.  Nausea and vomiting.  Blurred vision.  Dizziness.  Involuntary eye movement (nystagmus). Symptoms can be mild and cause only minor problems, or they can be severe and interfere with daily life. Episodes of benign positional vertigo may return (recur) over time. Symptoms may improve over time. How is this diagnosed? This condition may be diagnosed based on:  Your medical history.  Physical exam of the head, neck, and ears.  Positional tests to check for or stimulate vertigo. You may be  asked to turn your head and change positions, such as going from sitting to lying down. A health care provider will watch for symptoms of vertigo. You may be referred to a health care provider who specializes in ear, nose, and throat problems (ENT, or otolaryngologist) or a provider who specializes in disorders of the nervous system (neurologist). How is this treated? This condition may be treated in a session in which your health care provider moves your head in specific positions to help the displaced crystals in your inner ear move. Treatment for this condition may take several sessions. Surgery may be needed in severe cases, but this is rare. In some cases, benign positional vertigo may resolve on its own in 2-4 weeks.   Follow these instructions at home: Safety  Move slowly. Avoid sudden body or head movements or certain positions, as told by your health care provider.  Avoid driving until your health care provider says it is safe for you to do so.  Avoid operating heavy machinery until your health care provider says it is safe for you to do so.  Avoid doing any tasks that would be dangerous to you or others if vertigo occurs.  If you have trouble walking or keeping your balance, try using a cane for stability. If you feel dizzy or unstable, sit down right away.  Return to your normal activities as told by your health care provider. Ask your health care provider what activities are safe for you. General instructions  Take over-the-counter and prescription medicines only as told by your health care provider.  Drink enough fluid   to keep your urine pale yellow.  Keep all follow-up visits as told by your health care provider. This is important. Contact a health care provider if:  You have a fever.  Your condition gets worse or you develop new symptoms.  Your family or friends notice any behavioral changes.  You have nausea or vomiting that gets worse.  You have numbness or a  prickling and tingling sensation. Get help right away if you:  Have difficulty speaking or moving.  Are always dizzy.  Faint.  Develop severe headaches.  Have weakness in your legs or arms.  Have changes in your hearing or vision.  Develop a stiff neck.  Develop sensitivity to light. Summary  Vertigo is the feeling that you or your surroundings are moving when they are not. Benign positional vertigo is the most common form of vertigo.  This condition is caused by crystals in the inner ear that become displaced. This causes a disturbance in an area of the inner ear that helps your brain sense movement and balance.  Symptoms include loss of balance and falling, feeling that you or your surroundings are moving, nausea and vomiting, and blurred vision.  This condition can be diagnosed based on symptoms, a physical exam, and positional tests.  Follow safety instructions as told by your health care provider. You will also be told when to contact your health care provider in case of problems. This information is not intended to replace advice given to you by your health care provider. Make sure you discuss any questions you have with your health care provider. Document Revised: 05/13/2019 Document Reviewed: 11/28/2017 Elsevier Patient Education  2021 [employer]  

## 2020-08-07 NOTE — Evaluation (Signed)
Physical Therapy Evaluation Patient Details Name: Tristan Gardner MRN: [id number] DOB: 02-Apr-1935 Today's Date: 08/07/2020   History of Present Illness  86 y.o. male with medical history significant of A.Fib/flutter not on anticoagulation due to iron def anemia previously, CAD, obesity.  C/o intermittent episodes of lightheadedness and dizziness since Monday.  Had fall and hit back of head in bathroom this AM.  No LOC.  Clinical Impression   Pt admitted with above diagnosis. Patient reports 4 day h/o vertigo--began first thing in the morning when he woke up. After 2 days, he had a fall in the bathroom due to the vertigo. Patient tested +for left posterior canal BPPV (could not observe nystagmus because pt squeezing eyes shut, but more symptomatic and for longer duration during left sidelying test). Treated with Left Epley maneuver and sidelying test then negative bilaterally. Did have brief vertigo when moved from sit to stand, but resolved and did not recur during ambulation.  Pt currently with functional limitations due to the deficits listed below (see PT Problem List). Pt will benefit from skilled PT to increase their independence and safety with mobility to allow discharge to the venue listed below.    Messaged MD and RN re: pt needs to stand/walk in ~1 hour and if vertigo resolved, then should be able to discharge home. If vertigo persists, recommend no discharge and PT see pt again 2/6.      Follow Up Recommendations Outpatient PT;Supervision - Intermittent (vestibular rehab)    Equipment Recommendations  None recommended by PT    Recommendations for Other Services       Precautions / Restrictions Precautions Precautions: Fall Precaution Comments: pt reports he fell AFTER he'd had vertigo x 2 days; woke up with it Restrictions Weight Bearing Restrictions: No     08/07/20 1527  Vestibular Assessment  General Observation vertigo began upon awakening 4 days ago  Symptom Behavior   Type of Dizziness  Spinning  Frequency of Dizziness multiple times per day  Duration of Dizziness seconds  Symptom Nature Motion provoked  Aggravating Factors Supine to sit;Sit to stand  Relieving Factors Rest  Progression of Symptoms Better  History of similar episodes no  Oculomotor Exam-Fixation Suppressed   Left Head Impulse negative  Right Head Impulse eyes drift rt and refixate  Other Tests  Comments vertical skew negative bil  Positional Testing  Sidelying Test Sidelying Right;Sidelying Left  Sidelying Right  Sidelying Right Duration 20  Sidelying Right Symptoms Other (comment) (pt squeezing eys shut)  Sidelying Left  Sidelying Left Duration 35  Sidelying Left Symptoms  (pt squeezing eys shut)               08/07/20 0001  Vestibular Treatment/Exercise  Vestibular Treatment Provided Canalith Repositioning  Canalith Repositioning Epley Manuever Left   EPLEY MANUEVER LEFT  Number of Reps  1  Overall Response  Improved Symptoms   RESPONSE DETAILS LEFT repeated rt and lt sidelying tests and negative. Did have slight spinning with sit to stand   Mobility   Bed Mobility Overal bed mobility: Modified Independent             General bed mobility comments: needed SR's and extra time.    Transfers Overall transfer level: Needs assistance Equipment used: Rolling walker (2 wheeled) Transfers: Sit to/from Stand Sit to Stand: Supervision         General transfer comment: continues to complain of dizziness. but no LOB.  Ambulation/Gait Ambulation/Gait assistance: Supervision Gait Distance (Feet): 12 Feet Assistive  device: Rolling walker (2 wheeled)       General Gait Details: slow, cautious gait with right turns around bed to recliner. Denied vertigo after it "settled" after he stood up.  Stairs            Wheelchair Mobility    Modified Rankin (Stroke Patients Only)       Balance Overall balance assessment: Needs  assistance Sitting-balance support: Feet supported Sitting balance-Leahy Scale: Good     Standing balance support: Bilateral upper extremity supported Standing balance-Leahy Scale: Poor Standing balance comment: needs RW for support                             Pertinent Vitals/Pain Pain Assessment: Faces Faces Pain Scale: Hurts a little bit Pain Location: L hip Pain Descriptors / Indicators: Aching Pain Intervention(s): Monitored during session;Limited activity within patient's tolerance    Home Living Family/patient expects to be discharged to:: Private residence Living Arrangements: Children;Other relatives (Daughter and Granddaughter) Available Help at Discharge: Family;Available PRN/intermittently Type of Home: House Home Access: Level entry     Home Layout: Two level;Able to live on main level with bedroom/bathroom Home Equipment: Hand held shower head;Tub bench;Walker - 4 wheels;Cane - single point      Prior Function Level of Independence: Needs assistance   Gait / Transfers Assistance Needed: limited community ambulation with cane or Rollator , uses scooter in stores.  Uses his 4WRW in the houe and SPC to the care.  ADL's / Homemaking Assistance Needed: Patient's daughter completed majority of home managment and meal prep.  Patient stated he could assist a little.  Chronic back issues and L hip.  Patient is able to bathe and dress himself with increased time.  Daughter will assist with socks and shoes on occasion.        Hand Dominance   Dominant Hand: Right    Extremity/Trunk Assessment   Upper Extremity Assessment Upper Extremity Assessment: Overall WFL for tasks assessed    Lower Extremity Assessment Lower Extremity Assessment: LLE deficits/detail LLE Deficits / Details: chronic hip pain causing antalgic gait    Cervical / Trunk Assessment Cervical / Trunk Assessment: Kyphotic  Communication   Communication: HOH  Cognition  Arousal/Alertness: Awake/alert Behavior During Therapy: WFL for tasks assessed/performed Overall Cognitive Status: Within Functional Limits for tasks assessed                                        General Comments      Exercises     Assessment/Plan    PT Assessment Patient needs continued PT services  PT Problem List Decreased activity tolerance;Decreased balance;Decreased mobility;Pain       PT Treatment Interventions Gait training;Functional mobility training;Therapeutic activities;Neuromuscular re-education;Patient/family education;Other (comment) (cannalith repositioniing)    PT Goals (Current goals can be found in the Care Plan section)  Acute Rehab PT Goals Patient Stated Goal: Hope that dizziness goes away PT Goal Formulation: With patient Time For Goal Achievement: 08/21/20 Potential to Achieve Goals: Good    Frequency Min 4X/week   Barriers to discharge Decreased caregiver support      Co-evaluation               AM-PAC PT "6 Clicks" Mobility  Outcome Measure Help needed turning from your back to your side while in a flat bed without using bedrails?: None  Help needed moving from lying on your back to sitting on the side of a flat bed without using bedrails?: A Little Help needed moving to and from a bed to a chair (including a wheelchair)?: A Little Help needed standing up from a chair using your arms (e.g., wheelchair or bedside chair)?: A Little Help needed to walk in hospital room?: A Little Help needed climbing 3-5 steps with a railing? : A Little 6 Click Score: 19    End of Session   Activity Tolerance: Patient tolerated treatment well Patient left: in chair;with call bell/phone within reach Nurse Communication: Mobility status;Other (comment) (vertigo may resolve in 60 minutes; try standing/walking again) PT Visit Diagnosis: BPPV;History of falling (Z91.81)    Time: 6606-3016 PT Time Calculation (min) (ACUTE ONLY): 48  min   Charges:   PT Evaluation $PT Eval Moderate Complexity: 1 Mod PT Treatments $Therapeutic Activity: 8-22 mins $Canalith Rep Proc: 8-22 mins         Jerolyn Center, PT Pager [phone]   [name] 08/07/2020, 3:27 PM

## 2020-08-07 NOTE — Progress Notes (Addendum)
Progress Note  Patient Name: Tristan Gardner Date of Encounter: 08/07/2020  Primary Care Provider: Patient, No Pcp Per [facility] Cardiologist: Penni Bombard, MD  Subjective   His symptoms of dizziness have resolved since yesterday.   Inpatient Medications    Scheduled Meds: . aspirin EC  81 mg Oral Daily  . carvedilol  12.5 mg Oral BID WC  . gabapentin  200 mg Oral TID  . iron polysaccharides  150 mg Oral Daily   Continuous Infusions: . heparin 1,700 Units/hr (08/07/20 0609)   PRN Meds: acetaminophen, albuterol, ondansetron (ZOFRAN) IV   Vital Signs    Vitals:   08/07/20 0100 08/07/20 0315 08/07/20 0629 08/07/20 0816  BP: 137/82 (!) 168/77 (!) 129/97 (!) 118/98  Pulse: 71 65 70 70  Resp: 13 12 20 18   Temp:   98.2 F (36.8 C) 98.9 F (37.2 C)  TempSrc:   Oral Oral  SpO2: 96% 100% 95%   Weight:      Height:        Intake/Output Summary (Last 24 hours) at 08/07/2020 0900 Last data filed at 08/07/2020 10/05/2020 Gross per 24 hour  Intake 0 ml  Output 400 ml  Net -400 ml   Last 3 Weights 08/06/2020 05/18/2020 05/18/2020  Weight (lbs) 260 lb 241 lb 6.5 oz 241 lb 6.5 oz  Weight (kg) 117.935 kg 109.5 kg 109.5 kg      Telemetry    Atrial flutter with ventricular rates in 70 - Personally Reviewed  ECG    No new tracing - Personally Reviewed  Physical Exam   GEN: No acute distress.   Neck: No JVD Cardiac: RRR, no murmurs, rubs, or gallops.  Respiratory: Clear to auscultation bilaterally. GI: Soft, nontender, non-distended  MS: No edema; No deformity. Neuro:  Nonfocal  Psych: Normal affect   Labs    High Sensitivity Troponin:   Recent Labs  Lab 08/06/20 0900 08/06/20 1126  TROPONINIHS 48* 36*      Chemistry Recent Labs  Lab 08/06/20 0900  NA 138  K 3.9  CL 102  CO2 23  GLUCOSE 123*  BUN 11  CREATININE 1.23  CALCIUM 9.0  PROT 7.8  ALBUMIN 3.4*  AST 24  ALT 19  ALKPHOS 59  BILITOT 1.1  GFRNONAA 58*  ANIONGAP 13      Hematology Recent Labs  Lab 08/06/20 0900 08/07/20 0443  WBC 3.0* 3.5*  RBC 4.98 4.74  HGB 15.6 14.8  HCT 45.9 43.1  MCV 92.2 90.9  MCH 31.3 31.2  MCHC 34.0 34.3  RDW 14.3 14.4  PLT 186 162    BNP Recent Labs  Lab 08/06/20 0900  BNP 38.1     DDimer No results for input(s): DDIMER in the last 168 hours.   Radiology    CT Head Wo Contrast  Result Date: 08/06/2020 CLINICAL DATA:  Dizziness, multiple falls. EXAM: CT HEAD WITHOUT CONTRAST TECHNIQUE: Contiguous axial images were obtained from the base of the skull through the vertex without intravenous contrast. COMPARISON:  None. FINDINGS: Brain: Mild chronic ischemic white matter disease is noted. Mild diffuse cortical atrophy is noted. No mass effect or midline shift is noted. Ventricular size is within normal limits. There is no evidence of mass lesion, hemorrhage or acute infarction. Vascular: No hyperdense vessel or unexpected calcification. Skull: Normal. Negative for fracture or focal lesion. Sinuses/Orbits: No acute finding. Other: None. IMPRESSION: Mild chronic ischemic white matter disease. Mild diffuse cortical atrophy. No acute intracranial abnormality seen. Electronically Signed  By: Lupita Raider M.D.   On: 08/06/2020 10:00   DG Chest Port 1 View  Result Date: 08/06/2020 CLINICAL DATA:  Chest pain EXAM: PORTABLE CHEST 1 VIEW COMPARISON:  05/17/2020 FINDINGS: 0854 hours. The lungs are clear without focal pneumonia, edema, pneumothorax or pleural effusion. The cardiopericardial silhouette is within normal limits for size. The visualized bony structures of the thorax show no acute abnormality. Telemetry leads overlie the chest. IMPRESSION: No active disease. Electronically Signed   By: Kennith Center M.D.   On: 08/06/2020 09:28    Cardiac Studies    Patient Profile     86 y.o. male with a hx of chronic atrial fibrillation/flutter, not on chronic anticoagulation 2nd to iron deficiency anemia requiring iron  transfusion x2, coronary artery calcification and obesity who is being seen today for the evaluation of dizziness and episodes of presyncope.  Admitted 03/2020 with afib RVR. Echocardiogram obtained on 03/12/2020 showed EF 50 to 55%, global hypokinesis, normal pulmonary systolic pressure, mild to moderate MR, normal left atrial size and mildly dilated right atrial size. [facility] 12[facility]2019 showed normal colonoscopy and EGD. Dr. Jens Som recommended a trial of Eliquis for 4 week and outpatient cardioversion however he was not discharge on anticoagulation.  Last seen by Dr. Alysia Penna 03/2020 and recommended stress test but does not seems its done. His Toprol increased and placed on ASA but no anticoagulation. 48 hour monitor 04/2020 showed persistent atrial fluter/fibrillation at average rate of 101 bpm. The patient states that he was taking his meds.  He presented for lightheadedness and dizziness, started on Monday, feels like room is spinning.He had two episodes of near falls this morning but no loss of consciousness. On arrival in a-flutter with RVR with ventricular rate 160 BPM, currently in atrial flutter with 4:1 block and venmtricular rates 70 BPM after he was Given IV lopressor 12.5mg  x 1 with Hr in 70-90s. He continues to feel dizzy.   Assessment & Plan    Atrial flutter with RVR Vertigo Iron deficiency anemia Hypertension Covid infection  He remains in rate controlled flutter, good response to BB. His vertigo has resolved.  His history is consistent with noncompliance, he follows with two cardiologists but does not follow any recommendation with regards to use of anticoagulation or having stress test done. It is also questionable if he takes his medicines at home since he responded so well to very low-dose of IV Lopressor and now carvedilol.  We will sign off, please continue carvedilol 12.5 mg PO BID at the discharge, we will arrange for a follow up in the clinic.  He should be on anticoagulation, CHA2DS2-VASc Score = 4 , if he agrees to it and is able to follow. Most recent Hb 15.6. He can be discharged from cardiac standpoint.  For questions or updates, please contact [facility] Please consult [email or web address] for contact info under     Signed, Tobias Alexander, MD  08/07/2020, 9:00 AM

## 2020-08-07 NOTE — Progress Notes (Signed)
ANTICOAGULATION CONSULT NOTE   Pharmacy Consult for heparin Indication: atrial fibrillation  Labs: Recent Labs    08/06/20 0900 08/06/20 1126 08/07/20 0443  HGB 15.6  --  14.8  HCT 45.9  --  43.1  PLT 186  --  162  HEPARINUNFRC  --   --  0.20*  CREATININE 1.23  --   --   TROPONINIHS 48* 36*  --      Assessment: 86 yo male admitted for CP/syncope.  Hx of chronic atrial fibrillation/flutter NOT on chronic anticoagulation due to iron deficiency anemia requiring transfusion x2.  Follows w/ heme/onc.  Was recommended to trial Eliquis x 4 weeks on last admission however it was not started.  CBC on admit stable.  Pharmacy consulted to dose heparin. Initial heparin level 0.20 units/ml  Goal of Therapy:  Heparin level 0.3-0.7 units/ml Monitor platelets by anticoagulation protocol: Yes   Plan:  Increase heparin drip at 1700 units/hr F/u 8 hour HL Monitor s/sx bleeding, daily CBC F/u oral AC plans  Thanks for allowing pharmacy to be a part of this patient's care.  Talbert Cage, PharmD Clinical Pharmacist  08/07/2020 5:57 AM

## 2020-08-08 DIAGNOSIS — Z862 Personal history of diseases of the blood and blood-forming organs and certain disorders involving the immune mechanism: Secondary | ICD-10-CM

## 2020-08-08 DIAGNOSIS — I483 Typical atrial flutter: Secondary | ICD-10-CM

## 2020-08-08 MED ORDER — CARVEDILOL 12.5 MG PO TABS: 12.5000 mg | ORAL_TABLET | Freq: Two times a day (BID) | ORAL | 0 refills | Status: DC

## 2020-08-08 MED ORDER — ONDANSETRON 4 MG PO TBDP: 4.0000 mg | ORAL_TABLET | Freq: Three times a day (TID) | ORAL | 0 refills | Status: DC | PRN

## 2020-08-08 MED ORDER — MECLIZINE HCL 25 MG PO TABS
25.0000 mg | ORAL_TABLET | Freq: Three times a day (TID) | ORAL | Status: DC | PRN
  Administered 2020-08-08: 25 mg via ORAL
  Filled 2020-08-08 (×3): qty 1

## 2020-08-08 MED ORDER — MECLIZINE HCL 25 MG PO TABS: 25.0000 mg | ORAL_TABLET | Freq: Three times a day (TID) | ORAL | 0 refills | Status: DC | PRN

## 2020-08-08 MED ORDER — ONDANSETRON 4 MG PO TBDP
4.0000 mg | ORAL_TABLET | Freq: Three times a day (TID) | ORAL | Status: DC | PRN
  Administered 2020-08-08: 4 mg via ORAL
  Filled 2020-08-08: qty 1

## 2020-08-08 NOTE — Progress Notes (Signed)
Physical Therapy Treatment Patient Details Name: Tristan Gardner MRN: [id number] DOB: [birth year] Today's Date: 08/08/2020    History of Present Illness 86 y.o. male with medical history significant of A.Fib/flutter not on anticoagulation due to iron def anemia previously, CAD, obesity.  C/o intermittent episodes of lightheadedness and dizziness since Monday.  Had fall and hit back of head in bathroom this AM.  No LOC.    PT Comments    Patient yesterday strongly positive for left posterior BPPV. Today negative on left, however strongly positive for rt posterior BPPV. Difficult to assess direction of nystagmus as pt squeezes eyes closed and likely has accomodated after 5-6 days of BPPV so that eye movements are difficult to assess. Symptoms and reaction (grabbing for bed, trying to pull out of position) when doing rt Epley indicative of rt BPPV. Rt Epley performed x 2 with improvement in symptoms, however remained slightly dizzy with sit to stand--reported light-headed not spinning and BP assessed (143/93).   PT will return in ~1 hour to reassess need for further vestibular rehab after vestibular system has rested.     Follow Up Recommendations  Outpatient PT;Supervision - Intermittent (Vestibular rehab)     Equipment Recommendations  None recommended by PT    Recommendations for Other Services       Precautions / Restrictions Precautions Precautions: Fall Precaution Comments: pt reports he fell AFTER he'd had vertigo x 2 days; woke up with it     08/08/20 0001  Symptom Behavior  Type of Dizziness  Spinning  Frequency of Dizziness multiple times per day  Duration of Dizziness seconds  Symptom Nature Motion provoked  Aggravating Factors Supine to sit;Sit to stand  Relieving Factors Rest  Progression of Symptoms Better  History of similar episodes no  Positional Testing  Sidelying Test Sidelying Right;Sidelying Left  Sidelying Right  Sidelying Right Duration 45  Sidelying  Right Symptoms Other (comment) (squeezing eyes shut; reports spinning and grabbing to hold onto bed)  Sidelying Left  Sidelying Left Duration 0  Sidelying Left Symptoms Other (comment) (no symptoms)     08/08/20 0001  Vestibular Treatment/Exercise  Vestibular Treatment Provided Canalith Repositioning  Canalith Repositioning Epley Manuever Right   EPLEY MANUEVER RIGHT  Number of Reps  2  Overall Response Improved Symptoms  Response Details   (+spinning all positions 1 st Epley; +spining 1st and 4th positions on 2nd Epley)     Mobility  Bed Mobility Overal bed mobility: Modified Independent             General bed mobility comments: needed SR's and extra time.  Transfers Overall transfer level: Needs assistance Equipment used: Rolling walker (2 wheeled) Transfers: Sit to/from Stand Sit to Stand: Supervision         General transfer comment: continues to complain of dizziness. but no LOB.  Ambulation/Gait Ambulation/Gait assistance: Min guard Gait Distance (Feet): 12 Feet Assistive device: Rolling walker (2 wheeled)       General Gait Details: slow, cautious gait with right turns around bed to recliner. Denied vertigo after it "settled" after he stood up.   Stairs             Wheelchair Mobility    Modified Rankin (Stroke Patients Only)       Balance  Cognition                                              Exercises      General Comments        Pertinent Vitals/Pain      Home Living                      Prior Function            PT Goals (current goals can now be found in the care plan section) Acute Rehab PT Goals Patient Stated Goal: Hope that dizziness goes away Time For Goal Achievement: 08/21/20 Potential to Achieve Goals: Good Progress towards PT goals: Progressing toward goals    Frequency    Min 4X/week      PT Plan  Current plan remains appropriate    Co-evaluation              AM-PAC PT "6 Clicks" Mobility   Outcome Measure  Help needed turning from your back to your side while in a flat bed without using bedrails?: None Help needed moving from lying on your back to sitting on the side of a flat bed without using bedrails?: A Little Help needed moving to and from a bed to a chair (including a wheelchair)?: A Little Help needed standing up from a chair using your arms (e.g., wheelchair or bedside chair)?: A Little Help needed to walk in hospital room?: A Little Help needed climbing 3-5 steps with a railing? : A Little 6 Click Score: 19    End of Session Equipment Utilized During Treatment: Gait belt Activity Tolerance: Patient tolerated treatment well Patient left: in chair;with call bell/phone within reach Nurse Communication: Mobility status;Other (comment) (still with mild vertigo) PT Visit Diagnosis: BPPV;History of falling (Z91.81)     Time: 7096-2836 PT Time Calculation (min) (ACUTE ONLY): 39 min  Charges:  $Therapeutic Activity: 8-22 mins $Neuromuscular Re-education: 8-22 mins $Canalith Rep Proc: 8-22 mins                      Jerolyn Center, PT Pager [phone]    [name] 08/08/2020, 9:58 AM

## 2020-08-08 NOTE — Progress Notes (Signed)
PROGRESS NOTE  Tristan Gardner URK:[id number] DOB: [birth year]   PCP: Patient, No Pcp Per  Patient is from: Home. Lives with family. Uses walker at baseline.  DOA: 08/06/2020 LOS: 0  Chief complaints: Dizziness/fall  Brief Narrative  86 year old male with history of A. fib/A flutter not on AC due to iron deficient anemia/noncompliance , CAD, obesity and debility with walker dependence presented to ED with vertigo and mechanical fall.  He was evaluated in the ED and found to have A. fib with RVR and incidental COVID-19 infection.  Admitted to the hospitalist service-unfortunately-hospital course complicated by persistent vertigo and unsteady gait.  See below for further details.     Subjective: Still with significant amount of dizziness-Per nursing staff-unsteady on his feet-even while walking a few steps to the bathroom.  PT saw this morning-for vestibular procedures-still with symptoms in spite of Epley's maneuver.  Objective: Vitals:   08/07/20 1712 08/07/20 2059 08/08/20 0534 08/08/20 1011  BP: 117/74 110/67 117/68 (!) 143/90  Pulse: 71 70 70 74  Resp: 16 20 15    Temp: 97.7 F (36.5 C) 98.8 F (37.1 C) 97.6 F (36.4 C)   TempSrc: Oral Oral Axillary   SpO2:  98% 96%   Weight:      Height:        Intake/Output Summary (Last 24 hours) at 08/08/2020 1047 Last data filed at 08/08/2020 0941 Gross per 24 hour  Intake 600 ml  Output 1150 ml  Net -550 ml   Filed Weights   08/06/20 0845  Weight: 117.9 kg    Examination: Gen Exam:Alert awake-not in any distress HEENT:atraumatic, normocephalic Chest: B/L clear to auscultation anteriorly CVS:S1S2 regular Abdomen:soft non tender, non distended Extremities:no edema Neurology: Non focal Skin: no rash  Procedures:  None  Microbiology summarized: COVID-19 PCR positive.  Assessment & Plan: Vertigo: Likely peripheral-high suspicion for BPPV.  Unsafe discharge home given severe vertigo and unsteady gait.  Still with symptoms.   Underwent Epley's maneuver-but unfortunately continues to have significant amount of vertigo-PT to reevaluate later today.  Currently unsafe for discharge unless vertigo improves further.  Mechanical fall: Secondary to vertigo-denies loss of consciousness.  CT head with no acute findings.  Chronic atrial flutter/A. fib with RVR: Rate controlled-on beta-blocker-not on anticoagulation due to iron deficiency anemia and noncompliance.  Appreciate cardiology input.  Reassess his anticoagulation candidate at outpatient follow-up with cardiology.   History of iron deficiency anemia-followed by [facility] hematology: Hemoglobin stable-continue outpatient follow-up with GI/hematology.  No indication for inpatient work-up.  Elevated troponin: Likely demand ischemia in the setting of RVR.  No further work-up recommended by cardiology.  Essential hypertension: BP relatively stable-continue Coreg.    COVID-19 infection:incidental. He states he is fully vaccinated including booster.  Watch closely.  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  Class II obesity Body mass index is 36.26 kg/m.  -Encourage lifestyle change to lose weight.       DVT prophylaxis:  enoxaparin (LOVENOX) injection 40 mg Start: 08/07/20 2000  Code Status: Full code  Family Communication: Daughter-Tawanda 10/05/20 [phone] a voicemail on 2/6   Level of care: Telemetry Cardiac Status is: Observation  The patient remains OBS appropriate and will d/c before 2 midnights.  Dispo: The patient is from: Home              Anticipated d/c is to: Home              Anticipated d/c date is: 1 day  Patient currently is not medically stable to d/c.   Difficult to place patient No  Barriers to discharge: Still with significant vertigo and unsteady gait-unsafe discharge unless patient symptoms improve  Consultants:  Cardiology   Sch Meds:   Scheduled Meds: . aspirin EC  81 mg Oral Daily  . carvedilol  12.5 mg Oral BID WC  . enoxaparin (LOVENOX) injection  40 mg Subcutaneous Q24H  . gabapentin  200 mg Oral TID  . iron polysaccharides  150 mg Oral Daily   Continuous Infusions: PRN Meds:.acetaminophen, albuterol, ondansetron (ZOFRAN) IV  Antimicrobials: Anti-infectives (From admission, onward)   None       I have personally reviewed the following labs and images: CBC: Recent Labs  Lab 08/06/20 0900 08/07/20 0443  WBC 3.0* 3.5*  NEUTROABS 1.5*  --   HGB 15.6 14.8  HCT 45.9 43.1  MCV 92.2 90.9  PLT 186 162   BMP &GFR Recent Labs  Lab 08/06/20 0900  NA 138  K 3.9  CL 102  CO2 23  GLUCOSE 123*  BUN 11  CREATININE 1.23  CALCIUM 9.0   Estimated Creatinine Clearance: 59.4 mL/min (by C-G formula based on SCr of 1.23 mg/dL). Liver & Pancreas: Recent Labs  Lab 08/06/20 0900  AST 24  ALT 19  ALKPHOS 59  BILITOT 1.1  PROT 7.8  ALBUMIN 3.4*   No results for input(s): LIPASE, AMYLASE in the last 168 hours. No results for input(s): AMMONIA in the last 168 hours. Diabetic: No results for input(s): HGBA1C in the last 72 hours. No results for input(s): GLUCAP in the last 168 hours. Cardiac Enzymes: No results for input(s): CKTOTAL, CKMB, CKMBINDEX, TROPONINI in the last 168 hours. No results for input(s): PROBNP in the last 8760 hours. Coagulation Profile: No results for input(s): INR, PROTIME in the last 168 hours. Thyroid Function Tests: Recent Labs    08/06/20 0900  TSH 1.920  FREET4 1.14*   Lipid Profile: No results for input(s): CHOL, HDL, LDLCALC, TRIG, CHOLHDL, LDLDIRECT in the last 72 hours. Anemia Panel: No results for input(s): VITAMINB12, FOLATE, FERRITIN, TIBC, IRON, RETICCTPCT in the last 72 hours. Urine analysis: No results found for: COLORURINE, APPEARANCEUR, LABSPEC, PHURINE, GLUCOSEU, HGBUR, BILIRUBINUR, KETONESUR, PROTEINUR, UROBILINOGEN, NITRITE, LEUKOCYTESUR Sepsis  Labs: Invalid input(s): PROCALCITONIN, LACTICIDVEN  Microbiology: Recent Results (from the past 240 hour(s))  SARS CORONAVIRUS 2 (TAT 6-24 HRS) Nasopharyngeal Nasopharyngeal Swab     Status: Abnormal   Collection Time: 08/06/20 10:56 PM   Specimen: Nasopharyngeal Swab  Result Value Ref Range Status   SARS Coronavirus 2 POSITIVE (A) NEGATIVE Final    Comment: (NOTE) SARS-CoV-2 target nucleic acids are DETECTED.  The SARS-CoV-2 RNA is generally detectable in upper and lower respiratory specimens during the acute phase of infection. Positive results are indicative of the presence of SARS-CoV-2 RNA. Clinical correlation with patient history and other diagnostic information is  necessary to determine patient infection status. Positive results do not rule out bacterial infection or co-infection with other viruses.  The expected result is Negative.  Fact Sheet for Patients: HairSlick.no  Fact Sheet for Healthcare Providers: quierodirigir.com  This test is not yet approved or cleared by the [place] FDA and  has been authorized for detection and/or diagnosis of SARS-CoV-2 by FDA under an Emergency Use Authorization (EUA). This EUA will remain  in effect (meaning this test can be used) for the duration of the COVID-19 declaration under Section 564(b)(1) of the Act, 21 U. S.C. section 360bbb-3(b)(1), unless the authorization  is terminated or revoked sooner.   Performed at [facility], [address], [place], [place] [address]     Radiology Studies: No results found.    S Edmon Magid [facility]  If 7PM-7AM, please contact night-coverage [email or web address] 08/08/2020, 10:47 AM

## 2020-08-08 NOTE — TOC Transition Note (Signed)
Transition of Care [facility]) - CM/SW Discharge Note   Patient Details  Name: Tristan Gardner MRN: [id number] Date of Birth: Jun 18, 1935  [facility] CM/SW Contact:  Lawerance Sabal, RN Phone Number: 08/08/2020, 12:15 PM   Clinical Narrative:    Sherron Monday w patient who deferred to family. Patient will DC to granddaughter, [name]. He will go to [address] in [place] [employer]. Family would like HH PT. Known to [place]. Referral accepted for Vestibular PT.  No other CM needs.     Final next level of care: Home w Home Health Services Barriers to Discharge: No Barriers Identified   Patient Goals and CMS Choice Patient states their goals for this hospitalization and ongoing recovery are:: to go home CMS Medicare.gov Compare Post Acute Care list provided to:: Patient Choice offered to / list presented to : Patient  Discharge Placement                       Discharge Plan and Services                          HH Arranged: PT [facility] Agency: [facility] Date [facility] Agency Contacted: 08/08/20 Time HH Agency Contacted: 1215 Representative spoke with at [facility] Agency: Kandee Keen  Social Determinants of Health (SDOH) Interventions     Readmission Risk Interventions No flowsheet data found.

## 2020-08-08 NOTE — Discharge Summary (Addendum)
PATIENT DETAILS Name: Tristan Gardner Age: 86 y.o. Sex: male Date of Birth: 1934/11/30 MRN: [id number]. Admitting Physician: Hillary Bow, DO WUJ:[id number], No Pcp Per  Admit Date: 08/06/2020 Discharge date: 08/08/2020  Recommendations for Outpatient Follow-up:  1. Follow up with PCP in 1-2 weeks 2. Please obtain CMP/CBC in one week   Admitted From:  Home  Disposition: Home-with outpatient PT   Home Health: No  Equipment/Devices: None  Discharge Condition: Stable  CODE STATUS: FULL CODE  Diet recommendation:  Diet Order            Diet Heart Room service appropriate? Yes; Fluid consistency: Thin  Diet effective now           Diet - low sodium heart healthy                   Brief Narrative  86 year old male with history of A. fib/A flutter not on AC due to iron deficient anemia/noncompliance , CAD, obesity and debility with walker dependence presented to ED with vertigo and mechanical fall.  He was evaluated in the ED and found to have A. fib with RVR and incidental COVID-19 infection.  Admitted to the hospitalist service-unfortunately-hospital course complicated by persistent vertigo and unsteady gait.  See below for further details.   Brief Hospital Course: Vertigo: Likely peripheral-high suspicion for BPPV.    Had vertigo and unsteady gait-hence was not discharged home-followed by PT x2 today-Per PT-much better following numerous vestibular maneuvers.  Recommendations are for discharge today with outpatient PT.   Mechanical fall: Secondary to vertigo-denies loss of consciousness.  CT head with no acute findings.  Chronic atrial flutter/A. fib with RVR: Rate controlled-on beta-blocker-not on anticoagulation due to iron deficiency anemia and noncompliance.  Appreciate cardiology input.  Reassess his anticoagulation candidate at outpatient follow-up with cardiology.   History of iron deficiency anemia-followed by [facility] hematology: Hemoglobin stable-continue  outpatient follow-up with GI/hematology.  No indication for inpatient work-up.  Elevated troponin: Likely demand ischemia in the setting of RVR.  No further work-up recommended by cardiology.  Essential hypertension: BP relatively stable-continue Coreg.    COVID-19 infection:incidental. He states he is fully vaccinated including booster.  Watch closely.  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  Class II obesity Body mass index is 36.26 kg/m.  -Encourage lifestyle change to lose weight  COVID-19 Labs:  No results for input(s): DDIMER, FERRITIN, LDH, CRP in the last 72 hours.  Lab Results  Component Value Date   SARSCOV2NAA POSITIVE (A) 08/06/2020   SARSCOV2NAA NEGATIVE 05/17/2020   SARSCOV2NAA NEGATIVE 03/11/2020     Discharge Diagnoses:  Principal Problem:   Atrial flutter (HCC) Active Problems:   Chest pain, rule out acute myocardial infarction   Elevated blood pressure reading   H/O iron deficiency anemia   Discharge Instructions:    Person Under Monitoring Name: Tristan Gardner  Location: [address] [place] [address]   Infection Prevention Recommendations for Individuals Confirmed to have, or Being Evaluated for, 2019 Novel Coronavirus (COVID-19) Infection Who Receive Care at Home  Individuals who are confirmed to have, or are being evaluated for, COVID-19 should follow the prevention steps below until a healthcare provider or local or state health department says they can return to normal activities.  Stay home except to get medical care You should restrict activities outside your home, except for getting medical care. Do not go to work, school, or public areas, and do not use  public transportation or taxis.  Call ahead before visiting your doctor Before your medical appointment, call the healthcare provider and tell them that you have, or are being evaluated for, COVID-19  infection. This will help the healthcare provider's office take steps to keep other people from getting infected. Ask your healthcare provider to call the local or state health department.  Monitor your symptoms Seek prompt medical attention if your illness is worsening (e.g., difficulty breathing). Before going to your medical appointment, call the healthcare provider and tell them that you have, or are being evaluated for, COVID-19 infection. Ask your healthcare provider to call the local or state health department.  Wear a facemask You should wear a facemask that covers your nose and mouth when you are in the same room with other people and when you visit a healthcare provider. People who live with or visit you should also wear a facemask while they are in the same room with you.  Separate yourself from other people in your home As much as possible, you should stay in a different room from other people in your home. Also, you should use a separate bathroom, if available.  Avoid sharing household items You should not share dishes, drinking glasses, cups, eating utensils, towels, bedding, or other items with other people in your home. After using these items, you should wash them thoroughly with soap and water.  Cover your coughs and sneezes Cover your mouth and nose with a tissue when you cough or sneeze, or you can cough or sneeze into your sleeve. Throw used tissues in a lined trash can, and immediately wash your hands with soap and water for at least 20 seconds or use an alcohol-based hand rub.  Wash your [employer] your hands often and thoroughly with soap and water for at least 20 seconds. You can use an alcohol-based hand sanitizer if soap and water are not available and if your hands are not visibly dirty. Avoid touching your eyes, nose, and mouth with unwashed hands.   Prevention Steps for Caregivers and Household Members of Individuals Confirmed to have, or Being Evaluated  for, COVID-19 Infection Being Cared for in the Home  If you live with, or provide care at home for, a person confirmed to have, or being evaluated for, COVID-19 infection please follow these guidelines to prevent infection:  Follow healthcare provider's instructions Make sure that you understand and can help the patient follow any healthcare provider instructions for all care.  Provide for the patient's basic needs You should help the patient with basic needs in the home and provide support for getting groceries, prescriptions, and other personal needs.  Monitor the patient's symptoms If they are getting sicker, call his or her medical provider and tell them that the patient has, or is being evaluated for, COVID-19 infection. This will help the healthcare provider's office take steps to keep other people from getting infected. Ask the healthcare provider to call the local or state health department.  Limit the number of people who have contact with the patient  If possible, have only one caregiver for the patient.  Other household members should stay in another home or place of residence. If this is not possible, they should stay  in another room, or be separated from the patient as much as possible. Use a separate bathroom, if available.  Restrict visitors who do not have an essential need to be in the home.  Keep older adults, very young children, and other sick  people away from the patient Keep older adults, very young children, and those who have compromised immune systems or chronic health conditions away from the patient. This includes people with chronic heart, lung, or kidney conditions, diabetes, and cancer.  Ensure good ventilation Make sure that shared spaces in the home have good air flow, such as from an air conditioner or an opened window, weather permitting.  Wash your hands often  Wash your hands often and thoroughly with soap and water for at least 20 seconds. You  can use an alcohol based hand sanitizer if soap and water are not available and if your hands are not visibly dirty.  Avoid touching your eyes, nose, and mouth with unwashed hands.  Use disposable paper towels to dry your hands. If not available, use dedicated cloth towels and replace them when they become wet.  Wear a facemask and gloves  Wear a disposable facemask at all times in the room and gloves when you touch or have contact with the patient's blood, body fluids, and/or secretions or excretions, such as sweat, saliva, sputum, nasal mucus, vomit, urine, or feces.  Ensure the mask fits over your nose and mouth tightly, and do not touch it during use.  Throw out disposable facemasks and gloves after using them. Do not reuse.  Wash your hands immediately after removing your facemask and gloves.  If your personal clothing becomes contaminated, carefully remove clothing and launder. Wash your hands after handling contaminated clothing.  Place all used disposable facemasks, gloves, and other waste in a lined container before disposing them with other household waste.  Remove gloves and wash your hands immediately after handling these items.  Do not share dishes, glasses, or other household items with the patient  Avoid sharing household items. You should not share dishes, drinking glasses, cups, eating utensils, towels, bedding, or other items with a patient who is confirmed to have, or being evaluated for, COVID-19 infection.  After the person uses these items, you should wash them thoroughly with soap and water.  Wash laundry thoroughly  Immediately remove and wash clothes or bedding that have blood, body fluids, and/or secretions or excretions, such as sweat, saliva, sputum, nasal mucus, vomit, urine, or feces, on them.  Wear gloves when handling laundry from the patient.  Read and follow directions on labels of laundry or clothing items and detergent. In general, wash and dry with  the warmest temperatures recommended on the label.  Clean all areas the individual has used often  Clean all touchable surfaces, such as counters, tabletops, doorknobs, bathroom fixtures, toilets, phones, keyboards, tablets, and bedside tables, every day. Also, clean any surfaces that may have blood, body fluids, and/or secretions or excretions on them.  Wear gloves when cleaning surfaces the patient has come in contact with.  Use a diluted bleach solution (e.g., dilute bleach with 1 part bleach and 10 parts water) or a household disinfectant with a label that says EPA-registered for coronaviruses. To make a bleach solution at home, add 1 tablespoon of bleach to 1 quart (4 cups) of water. For a larger supply, add  cup of bleach to 1 gallon (16 cups) of water.  Read labels of cleaning products and follow recommendations provided on product labels. Labels contain instructions for safe and effective use of the cleaning product including precautions you should take when applying the product, such as wearing gloves or eye protection and making sure you have good ventilation during use of the product.  Remove gloves and  wash hands immediately after cleaning.  Monitor yourself for signs and symptoms of illness Caregivers and household members are considered close contacts, should monitor their health, and will be asked to limit movement outside of the home to the extent possible. Follow the monitoring steps for close contacts listed on the symptom monitoring form.   ? If you have additional questions, contact your local health department or call the epidemiologist on call at [phone] (available 24/7). ? This guidance is subject to change. For the most up-to-date guidance from CDC, please refer to their website: TripMetro.hu    Activity:  As tolerated  Discharge Instructions    Call MD for:  difficulty breathing, headache or  visual disturbances   Complete by: As directed    Call MD for:  difficulty breathing, headache or visual disturbances   Complete by: As directed    Call MD for:  extreme fatigue   Complete by: As directed    Call MD for:  persistant dizziness or light-headedness   Complete by: As directed    Call MD for:  persistant dizziness or light-headedness   Complete by: As directed    Diet - low sodium heart healthy   Complete by: As directed    Discharge instructions   Complete by: As directed    It has been a pleasure taking care of you!  You were hospitalized due to dizziness, fall and atrial fibrillation/flutter. Your dizziness and fall could be related to BPPV (benign paroxysmal positional vertigo). The treatment is basically vestibular therapy. We have ordered ambulatory referral to therapy center for this. See separate instruction for more on BPPV.   You also tested positive for COVID-19. However, you are fully vaccinated and  do not have any respiratory symptoms that warrants treatment for COVID-19. However, we strongly recommend you isolate yourself and wear mask when you are around people at least for the next 5 days  In regards to the atrial fibrillation, your heart rate has been well controlled after medication. However, you have significant risk of stroke if you are not on blood thinner or anticoagulation. We strongly recommend you discuss about anticoagulation (blood thinner) with your cardiologist at your next visit.  Please follow-up with your cardiologist in 1 to 2 weeks.  Please very important that you take your medications as prescribed.  Please review your new medication list and the directions on your medications before you take them.   Take care,   Discharge instructions   Complete by: As directed    1.)  10 days of isolation from 08/06/2010  2.)  If you develop worsening shortness of breath-please seek immediate medical attention   Follow with Primary MD  in 1-2  weeks  Please get a complete blood count and chemistry panel checked by your Primary MD at your next visit, and again as instructed by your Primary MD.  Get Medicines reviewed and adjusted: Please take all your medications with you for your next visit with your Primary MD  Laboratory/radiological data: Please request your Primary MD to go over all hospital tests and procedure/radiological results at the follow up, please ask your Primary MD to get all Hospital records sent to his/her office.  In some cases, they will be blood work, cultures and biopsy results pending at the time of your discharge. Please request that your primary care M.D. follows up on these results.  Also Note the following: If you experience worsening of your admission symptoms, develop shortness of breath, life threatening emergency,  suicidal or homicidal thoughts you must seek medical attention immediately by calling 911 or calling your MD immediately  if symptoms less severe.  You must read complete instructions/literature along with all the possible adverse reactions/side effects for all the Medicines you take and that have been prescribed to you. Take any new Medicines after you have completely understood and accpet all the possible adverse reactions/side effects.   Do not drive when taking Pain medications or sleeping medications (Benzodaizepines)  Do not take more than prescribed Pain, Sleep and Anxiety Medications. It is not advisable to combine anxiety,sleep and pain medications without talking with your primary care practitioner  Special Instructions: If you have smoked or chewed Tobacco  in the last 2 yrs please stop smoking, stop any regular Alcohol  and or any Recreational drug use.  Wear Seat belts while driving.  Please note: You were cared for by a hospitalist during your hospital stay. Once you are discharged, your primary care physician will handle any further medical issues. Please note that NO REFILLS  for any discharge medications will be authorized once you are discharged, as it is imperative that you return to your primary care physician (or establish a relationship with a primary care physician if you do not have one) for your post hospital discharge needs so that they can reassess your need for medications and monitor your lab values.   Increase activity slowly   Complete by: As directed    Increase activity slowly   Complete by: As directed      Allergies as of 08/08/2020      Reactions   Iodine Hives, Itching, Rash      Ferrous Ammonium Sulfate Hives   Shellfish Allergy Hives      Medication List    TAKE these medications   acetaminophen 500 MG tablet Commonly known as: TYLENOL Take 1,000 mg by mouth every 6 (six) hours as needed for mild pain.   albuterol 108 (90 Base) MCG/ACT inhaler Commonly known as: VENTOLIN HFA Inhale 2 puffs into the lungs every 4 (four) hours as needed for wheezing or shortness of breath.   aspirin EC 81 MG tablet Take 81 mg by mouth daily. Swallow whole.   baclofen 10 MG tablet Commonly known as: LIORESAL Take 10 mg by mouth daily.   carvedilol 12.5 MG tablet Commonly known as: COREG Take 1 tablet (12.5 mg total) by mouth 2 (two) times daily with a meal.   furosemide 40 MG tablet Commonly known as: LASIX Take 0.5 tablets (20 mg total) by mouth daily.   gabapentin 100 MG capsule Commonly known as: NEURONTIN Take 200 mg by mouth 3 (three) times daily.   iron polysaccharides 150 MG capsule Commonly known as: NIFEREX Take 1 capsule (150 mg total) by mouth daily.   meclizine 25 MG tablet Commonly known as: ANTIVERT Take 1 tablet (25 mg total) by mouth 3 (three) times daily as needed for dizziness.   ondansetron 4 MG disintegrating tablet Commonly known as: ZOFRAN-ODT Take 1 tablet (4 mg total) by mouth every 8 (eight) hours as needed for nausea or vomiting.       Follow-up Information    Rhinehart, Olga Coaster, MD. Schedule an  appointment as soon as possible for a visit in 1 week(s).   Specialty: Cardiology Contact information: [address] [place] [address] [phone]        [facility], [facility] Follow up.   Specialty: Home Health Services Why: For home health, they will  call [name] in the next 1-2 days to set up home health services. Contact information: [address] [place] [address] [phone]              Allergies  Allergen Reactions  . Iodine Hives, Itching and Rash        . Ferrous Ammonium Sulfate Hives  . Shellfish Allergy Hives    Other Procedures/Studies: CT Head Wo Contrast  Result Date: 08/06/2020 CLINICAL DATA:  Dizziness, multiple falls. EXAM: CT HEAD WITHOUT CONTRAST TECHNIQUE: Contiguous axial images were obtained from the base of the skull through the vertex without intravenous contrast. COMPARISON:  None. FINDINGS: Brain: Mild chronic ischemic white matter disease is noted. Mild diffuse cortical atrophy is noted. No mass effect or midline shift is noted. Ventricular size is within normal limits. There is no evidence of mass lesion, hemorrhage or acute infarction. Vascular: No hyperdense vessel or unexpected calcification. Skull: Normal. Negative for fracture or focal lesion. Sinuses/Orbits: No acute finding. Other: None. IMPRESSION: Mild chronic ischemic white matter disease. Mild diffuse cortical atrophy. No acute intracranial abnormality seen. Electronically Signed   By: Lupita Raider M.D.   On: 08/06/2020 10:00   DG Chest Port 1 View  Result Date: 08/06/2020 CLINICAL DATA:  Chest pain EXAM: PORTABLE CHEST 1 VIEW COMPARISON:  05/17/2020 FINDINGS: 0854 hours. The lungs are clear without focal pneumonia, edema, pneumothorax or pleural effusion. The cardiopericardial silhouette is within normal limits for size. The visualized bony structures of the thorax show no acute abnormality. Telemetry leads overlie the chest. IMPRESSION: No  active disease. Electronically Signed   By: Kennith Center M.D.   On: 08/06/2020 09:28     TODAY-DAY OF DISCHARGE:  Subjective:   Tristan Gardner today has no headache,no chest abdominal pain,no new weakness tingling or numbness, feels much better wants to go home today.   Objective:   Blood pressure (!) 143/90, pulse 74, temperature 97.6 F (36.4 C), temperature source Axillary, resp. rate 15, height 5\' 11"  (1.803 m), weight 117.9 kg, SpO2 96 %.  Intake/Output Summary (Last 24 hours) at 08/08/2020 1349 Last data filed at 08/08/2020 0941 Gross per 24 hour  Intake 600 ml  Output 1150 ml  Net -550 ml   Filed Weights   08/06/20 0845  Weight: 117.9 kg    Exam: Awake Alert, Oriented *3, No new F.N deficits, Normal affect [place].AT,PERRAL Supple Neck,No JVD, No cervical lymphadenopathy appriciated.  Symmetrical Chest wall movement, Good air movement bilaterally, CTAB RRR,No Gallops,Rubs or new Murmurs, No Parasternal Heave +ve B.Sounds, Abd Soft, Non tender, No organomegaly appriciated, No rebound -guarding or rigidity. No Cyanosis, Clubbing or edema, No new Rash or bruise   PERTINENT RADIOLOGIC STUDIES: CT Head Wo Contrast  Result Date: 08/06/2020 CLINICAL DATA:  Dizziness, multiple falls. EXAM: CT HEAD WITHOUT CONTRAST TECHNIQUE: Contiguous axial images were obtained from the base of the skull through the vertex without intravenous contrast. COMPARISON:  None. FINDINGS: Brain: Mild chronic ischemic white matter disease is noted. Mild diffuse cortical atrophy is noted. No mass effect or midline shift is noted. Ventricular size is within normal limits. There is no evidence of mass lesion, hemorrhage or acute infarction. Vascular: No hyperdense vessel or unexpected calcification. Skull: Normal. Negative for fracture or focal lesion. Sinuses/Orbits: No acute finding. Other: None. IMPRESSION: Mild chronic ischemic white matter disease. Mild diffuse cortical atrophy. No acute intracranial  abnormality seen. Electronically Signed   By: 10/04/2020 M.D.   On: 08/06/2020 10:00  DG Chest Port 1 View  Result Date: 08/06/2020 CLINICAL DATA:  Chest pain EXAM: PORTABLE CHEST 1 VIEW COMPARISON:  05/17/2020 FINDINGS: 0854 hours. The lungs are clear without focal pneumonia, edema, pneumothorax or pleural effusion. The cardiopericardial silhouette is within normal limits for size. The visualized bony structures of the thorax show no acute abnormality. Telemetry leads overlie the chest. IMPRESSION: No active disease. Electronically Signed   By: Kennith Center M.D.   On: 08/06/2020 09:28     PERTINENT LAB RESULTS: CBC: Recent Labs    08/06/20 0900 08/07/20 0443  WBC 3.0* 3.5*  HGB 15.6 14.8  HCT 45.9 43.1  PLT 186 162   CMET CMP     Component Value Date/Time   NA 138 08/06/2020 0900   K 3.9 08/06/2020 0900   CL 102 08/06/2020 0900   CO2 23 08/06/2020 0900   GLUCOSE 123 (H) 08/06/2020 0900   BUN 11 08/06/2020 0900   CREATININE 1.23 08/06/2020 0900   CALCIUM 9.0 08/06/2020 0900   PROT 7.8 08/06/2020 0900   ALBUMIN 3.4 (L) 08/06/2020 0900   AST 24 08/06/2020 0900   ALT 19 08/06/2020 0900   ALKPHOS 59 08/06/2020 0900   BILITOT 1.1 08/06/2020 0900   GFRNONAA 58 (L) 08/06/2020 0900   GFRAA >60 03/12/2020 0422    GFR Estimated Creatinine Clearance: 59.4 mL/min (by C-G formula based on SCr of 1.23 mg/dL). No results for input(s): LIPASE, AMYLASE in the last 72 hours. No results for input(s): CKTOTAL, CKMB, CKMBINDEX, TROPONINI in the last 72 hours. Invalid input(s): POCBNP No results for input(s): DDIMER in the last 72 hours. No results for input(s): HGBA1C in the last 72 hours. No results for input(s): CHOL, HDL, LDLCALC, TRIG, CHOLHDL, LDLDIRECT in the last 72 hours. Recent Labs    08/06/20 0900  TSH 1.920   No results for input(s): VITAMINB12, FOLATE, FERRITIN, TIBC, IRON, RETICCTPCT in the last 72 hours. Coags: No results for input(s): INR in the last 72  hours.  Invalid input(s): PT Microbiology: Recent Results (from the past 240 hour(s))  SARS CORONAVIRUS 2 (TAT 6-24 HRS) Nasopharyngeal Nasopharyngeal Swab     Status: Abnormal   Collection Time: 08/06/20 10:56 PM   Specimen: Nasopharyngeal Swab  Result Value Ref Range Status   SARS Coronavirus 2 POSITIVE (A) NEGATIVE Final    Comment: (NOTE) SARS-CoV-2 target nucleic acids are DETECTED.  The SARS-CoV-2 RNA is generally detectable in upper and lower respiratory specimens during the acute phase of infection. Positive results are indicative of the presence of SARS-CoV-2 RNA. Clinical correlation with patient history and other diagnostic information is  necessary to determine patient infection status. Positive results do not rule out bacterial infection or co-infection with other viruses.  The expected result is Negative.  Fact Sheet for Patients: HairSlick.no  Fact Sheet for Healthcare Providers: quierodirigir.com  This test is not yet approved or cleared by the [place] FDA and  has been authorized for detection and/or diagnosis of SARS-CoV-2 by FDA under an Emergency Use Authorization (EUA). This EUA will remain  in effect (meaning this test can be used) for the duration of the COVID-19 declaration under Section 564(b)(1) of the Act, 21 U. S.C. section 360bbb-3(b)(1), unless the authorization is terminated or revoked sooner.   Performed at [facility], [address] [place], [place] [address]     FURTHER DISCHARGE INSTRUCTIONS:  Get Medicines reviewed and adjusted: Please take all your medications with you for your next visit with your Primary  MD  Laboratory/radiological data: Please request your Primary MD to go over all hospital tests and procedure/radiological results at the follow up, please ask your Primary MD to get all Hospital records sent to his/her office.  In some cases, they will be blood  work, cultures and biopsy results pending at the time of your discharge. Please request that your primary care M.D. goes through all the records of your hospital data and follows up on these results.  Also Note the following: If you experience worsening of your admission symptoms, develop shortness of breath, life threatening emergency, suicidal or homicidal thoughts you must seek medical attention immediately by calling 911 or calling your MD immediately  if symptoms less severe.  You must read complete instructions/literature along with all the possible adverse reactions/side effects for all the Medicines you take and that have been prescribed to you. Take any new Medicines after you have completely understood and accpet all the possible adverse reactions/side effects.   Do not drive when taking Pain medications or sleeping medications (Benzodaizepines)  Do not take more than prescribed Pain, Sleep and Anxiety Medications. It is not advisable to combine anxiety,sleep and pain medications without talking with your primary care practitioner  Special Instructions: If you have smoked or chewed Tobacco  in the last 2 yrs please stop smoking, stop any regular Alcohol  and or any Recreational drug use.  Wear Seat belts while driving.  Please note: You were cared for by a hospitalist during your hospital stay. Once you are discharged, your primary care physician will handle any further medical issues. Please note that NO REFILLS for any discharge medications will be authorized once you are discharged, as it is imperative that you return to your primary care physician (or establish a relationship with a primary care physician if you do not have one) for your post hospital discharge needs so that they can reassess your need for medications and monitor your lab values.  Total Time spent coordinating discharge including counseling, education and face to face time equals 25minutes.  SignedJeoffrey Massed: Alfonza Toft 08/08/2020 1:49 PM

## 2020-08-08 NOTE — Progress Notes (Signed)
Physical Therapy Treatment Patient Details Name: Tristan Gardner MRN: [id number] DOB: Apr 30, 1935 Today's Date: 08/08/2020    History of Present Illness 86 y.o. male with medical history significant of A.Fib/flutter not on anticoagulation due to iron def anemia previously, CAD, obesity.  C/o intermittent episodes of lightheadedness and dizziness since Monday.  Had fall and hit back of head in bathroom this AM.  No LOC.    PT Comments    Patient able to stand and walk 200 ft with RW without dizziness UNTIL final 20 ft he felt mild spinning and appropriately stopped and waited for it to pass. Reassessed for left BPPV and pt with +spinning sensation in positions 1 and 4 of left Epley. At all times unable to visualize nystagmus due to pt squeezing eyes shut. ? If pt experiencing vertigo related to mild concusion from fall and hitting his head vs unresolved BPPV? Patient agrees he is much better than on admission and feels he can go home with continued HHPT to address vertigo. MD notified that pt also experiences LIGHTHEADEDNESS sporadically. Notified orthostatic BPs with normal response. Notified he had periods of ventricular bigeminy with HR up to 142 bpm during session. His vertigo nor lightheadedness were not associated with elevated HR.      Follow Up Recommendations  Supervision - Intermittent;Home health PT (Vestibular rehab)     Equipment Recommendations  None recommended by PT    Recommendations for Other Services       Precautions / Restrictions Precautions Precautions: Fall Precaution Comments: pt reports he fell AFTER he'd had vertigo x 2 days; woke up with it Restrictions Weight Bearing Restrictions: No    Mobility  Bed Mobility Overal bed mobility: Modified Independent             General bed mobility comments: needed SR's and extra time.  Transfers Overall transfer level: Needs assistance Equipment used: Rolling walker (2 wheeled) Transfers: Sit to/from Stand Sit  to Stand: Supervision         General transfer comment: no dizziness with change in position x 3 transfers  Ambulation/Gait Ambulation/Gait assistance: Min guard Gait Distance (Feet): 200 Feet Assistive device: Rolling walker (2 wheeled)       General Gait Details: slow, cautious gait due to antalgic hip (chronic). Head turns rt and left; body turns left and rt without spinning.   Stairs             Wheelchair Mobility    Modified Rankin (Stroke Patients Only)       Balance Overall balance assessment: Needs assistance Sitting-balance support: Feet supported Sitting balance-Leahy Scale: Good     Standing balance support: Bilateral upper extremity supported Standing balance-Leahy Scale: Poor Standing balance comment: needs RW for support                            Cognition Arousal/Alertness: Awake/alert Behavior During Therapy: WFL for tasks assessed/performed Overall Cognitive Status: Within Functional Limits for tasks assessed                                        Exercises Other Exercises Other Exercises: Educated in Brandt-Daroff exercises and rationale    General Comments        Pertinent Vitals/Pain      Home Living  Prior Function            PT Goals (current goals can now be found in the care plan section) Acute Rehab PT Goals Patient Stated Goal: Hope that dizziness goes away PT Goal Formulation: With patient Time For Goal Achievement: 08/21/20 Potential to Achieve Goals: Good Progress towards PT goals: Progressing toward goals    Frequency    Min 4X/week      PT Plan Current plan remains appropriate    Co-evaluation              AM-PAC PT "6 Clicks" Mobility   Outcome Measure  Help needed turning from your back to your side while in a flat bed without using bedrails?: None Help needed moving from lying on your back to sitting on the side of a flat bed  without using bedrails?: None Help needed moving to and from a bed to a chair (including a wheelchair)?: A Little Help needed standing up from a chair using your arms (e.g., wheelchair or bedside chair)?: A Little Help needed to walk in hospital room?: A Little Help needed climbing 3-5 steps with a railing? : A Little 6 Click Score: 20    End of Session Equipment Utilized During Treatment: Gait belt Activity Tolerance: Patient tolerated treatment well Patient left: in chair;with call bell/phone within reach Nurse Communication: Mobility status;Other (comment) (still with mild vertigo) PT Visit Diagnosis: BPPV;History of falling (Z91.81)     Time: 8938-1017 PT Time Calculation (min) (ACUTE ONLY): 40 min  Charges:  $Gait Training: 8-22 mins $Therapeutic Activity: 23-37 mins $Neuromuscular Re-education: 8-22 mins $Canalith Rep Proc: 8-22 mins                      Tristan Gardner, PT Pager [phone]    Tristan Gardner 08/08/2020, 11:39 AM

## 2020-08-08 NOTE — Progress Notes (Signed)
Pt needed to use bathroom. C/O feeling dizzy. Sat on edge of bed for a bit, then walked with walker to bathroom. Pt did well. Only time feeling dizzy was first getting OOB.

## 2020-08-08 NOTE — Progress Notes (Signed)
Note-reevaluated by PT-received secure chat message that patient much better-and has been cleared for discharge.  See discharge summary for details.

## 2021-02-16 ENCOUNTER — Encounter: Payer: Self-pay | Admitting: Emergency Medicine

## 2021-02-16 ENCOUNTER — Encounter (HOSPITAL_COMMUNITY): Payer: Self-pay

## 2021-02-16 ENCOUNTER — Other Ambulatory Visit: Payer: Self-pay

## 2021-02-16 ENCOUNTER — Ambulatory Visit
Admission: EM | Admit: 2021-02-16 | Discharge: 2021-02-16 | Disposition: A | Discharge status: Other Acute Inpatient Hospital | Payer: Medicare PPO

## 2021-02-16 ENCOUNTER — Emergency Department (HOSPITAL_COMMUNITY)
Admission: EM | Admit: 2021-02-16 | Discharge: 2021-02-16 | Disposition: A | Discharge status: Home / Self Care | Payer: Medicare PPO | Attending: Emergency Medicine | Admitting: Emergency Medicine

## 2021-02-16 ENCOUNTER — Emergency Department (HOSPITAL_COMMUNITY): Payer: Medicare PPO

## 2021-02-16 DIAGNOSIS — J209 Acute bronchitis, unspecified: Secondary | ICD-10-CM | POA: Diagnosis not present

## 2021-02-16 DIAGNOSIS — R079 Chest pain, unspecified: Secondary | ICD-10-CM

## 2021-02-16 DIAGNOSIS — R0682 Tachypnea, not elsewhere classified: Secondary | ICD-10-CM

## 2021-02-16 DIAGNOSIS — R059 Cough, unspecified: Secondary | ICD-10-CM

## 2021-02-16 DIAGNOSIS — Z20822 Contact with and (suspected) exposure to covid-19: Secondary | ICD-10-CM | POA: Insufficient documentation

## 2021-02-16 DIAGNOSIS — Z87891 Personal history of nicotine dependence: Secondary | ICD-10-CM | POA: Diagnosis not present

## 2021-02-16 DIAGNOSIS — Z7982 Long term (current) use of aspirin: Secondary | ICD-10-CM | POA: Diagnosis not present

## 2021-02-16 DIAGNOSIS — I2510 Atherosclerotic heart disease of native coronary artery without angina pectoris: Secondary | ICD-10-CM | POA: Diagnosis not present

## 2021-02-16 DIAGNOSIS — Z79899 Other long term (current) drug therapy: Secondary | ICD-10-CM | POA: Diagnosis not present

## 2021-02-16 DIAGNOSIS — R0602 Shortness of breath: Secondary | ICD-10-CM | POA: Diagnosis present

## 2021-02-16 DIAGNOSIS — R053 Chronic cough: Secondary | ICD-10-CM | POA: Diagnosis not present

## 2021-02-16 DIAGNOSIS — I10 Essential (primary) hypertension: Secondary | ICD-10-CM | POA: Diagnosis not present

## 2021-02-16 LAB — RESP PANEL BY RT-PCR (FLU A&B, COVID) ARPGX2
Influenza A by PCR: NEGATIVE
Influenza B by PCR: NEGATIVE
SARS Coronavirus 2 by RT PCR: NEGATIVE

## 2021-02-16 LAB — CBC
HCT: 42.9 % (ref 39.0–52.0)
Hemoglobin: 14.2 g/dL (ref 13.0–17.0)
MCH: 30.3 pg (ref 26.0–34.0)
MCHC: 33.1 g/dL (ref 30.0–36.0)
MCV: 91.5 fL (ref 80.0–100.0)
Platelets: 193 10*3/uL (ref 150–400)
RBC: 4.69 MIL/uL (ref 4.22–5.81)
RDW: 13.9 % (ref 11.5–15.5)
WBC: 3.6 10*3/uL — ABNORMAL LOW (ref 4.0–10.5)
nRBC: 0 % (ref 0.0–0.2)

## 2021-02-16 LAB — TROPONIN I (HIGH SENSITIVITY)
Troponin I (High Sensitivity): 68 ng/L — ABNORMAL HIGH (ref <18)
Troponin I (High Sensitivity): 58 ng/L — ABNORMAL HIGH (ref <18)
Troponin I (High Sensitivity): 57 ng/L — ABNORMAL HIGH (ref <18)
Troponin I (High Sensitivity): 58 ng/L — ABNORMAL HIGH (ref <18)

## 2021-02-16 LAB — BASIC METABOLIC PANEL
Anion gap: 9 (ref 5–15)
BUN: 7 mg/dL — ABNORMAL LOW (ref 8–23)
CO2: 25 mmol/L (ref 22–32)
Calcium: 8.9 mg/dL (ref 8.9–10.3)
Chloride: 101 mmol/L (ref 98–111)
Creatinine, Ser: 0.96 mg/dL (ref 0.61–1.24)
GFR, Estimated: >60 mL/min (ref >60)
Glucose, Bld: 117 mg/dL — ABNORMAL HIGH (ref 70–99)
Potassium: 3.9 mmol/L (ref 3.5–5.1)
Sodium: 135 mmol/L (ref 135–145)

## 2021-02-16 LAB — D-DIMER, QUANTITATIVE: D-Dimer, Quant: 0.4 ug/mL-FEU (ref 0.00–0.50)

## 2021-02-16 LAB — BRAIN NATRIURETIC PEPTIDE: B Natriuretic Peptide: 76.1 pg/mL (ref 0.0–100.0)

## 2021-02-16 MED ORDER — ALBUTEROL SULFATE (2.5 MG/3ML) 0.083% IN NEBU
5.0000 mg | INHALATION_SOLUTION | Freq: Once | RESPIRATORY_TRACT | Status: AC
  Administered 2021-02-16: 5 mg via RESPIRATORY_TRACT
  Filled 2021-02-16: qty 6

## 2021-02-16 MED ORDER — PREDNISONE 20 MG PO TABS: 20.0000 mg | ORAL_TABLET | Freq: Two times a day (BID) | ORAL | 0 refills | Status: DC

## 2021-02-16 MED ORDER — METHYLPREDNISOLONE SODIUM SUCC 125 MG IJ SOLR
125.0000 mg | Freq: Once | INTRAMUSCULAR | Status: AC
  Administered 2021-02-16: 125 mg via INTRAVENOUS
  Filled 2021-02-16: qty 2

## 2021-02-16 MED ORDER — ALBUTEROL SULFATE HFA 108 (90 BASE) MCG/ACT IN AERS
2.0000 | INHALATION_SPRAY | Freq: Once | RESPIRATORY_TRACT | Status: AC
  Administered 2021-02-16: 2 via RESPIRATORY_TRACT
  Filled 2021-02-16: qty 6.7

## 2021-02-16 MED ORDER — DOXYCYCLINE HYCLATE 100 MG PO CAPS: 100.0000 mg | ORAL_CAPSULE | Freq: Two times a day (BID) | ORAL | 0 refills | Status: DC

## 2021-02-16 NOTE — ED Notes (Signed)
Pt is requesting something to eat and drink. Provider sent a message reference same

## 2021-02-16 NOTE — ED Notes (Signed)
Received verbal report from Alana B RN at this time °

## 2021-02-16 NOTE — ED Triage Notes (Signed)
Pt reports dry cough and sob for the past 2 weeks, sent here by UC for further evaluation. Pt also reports pain to his chest. Resp e/u

## 2021-02-16 NOTE — ED Provider Notes (Signed)
[facility]    CSN: [id number] Arrival date & time: 02/16/21  0914      History   Chief Complaint Chief Complaint  Patient presents with   Cough    HPI Tristan Gardner is a 86 y.o. male.   Patient presents with a 2-week history of cough and shortness of breath.  States that he also has left-sided "rib cage" pain that is usually worse at night.  Has taken Robitussin and NyQuil over-the-counter without any relief.  Denies any known sick contacts or fevers.   Cough  Past Medical History:  Diagnosis Date   Atrial fibrillation [facility])    Coronary artery disease     Patient Active Problem List   Diagnosis Date Noted   Chest pain, rule out acute myocardial infarction 08/06/2020   Elevated blood pressure reading 08/06/2020   H/O iron deficiency anemia 08/06/2020   Atrial flutter (HCC) 05/18/2020   Viral URI with cough 05/18/2020   Atypical chest pain 05/17/2020   Atrial fibrillation (HCC) 03/11/2020    Past Surgical History:  Procedure Laterality Date   CARDIAC SURGERY     ROTATOR CUFF REPAIR         Home Medications    Prior to Admission medications   Medication Sig Start Date End Date Taking? Authorizing Provider  acetaminophen (TYLENOL) 500 MG tablet Take 1,000 mg by mouth every 6 (six) hours as needed for mild pain.   Yes [provider]  albuterol (VENTOLIN HFA) 108 (90 Base) MCG/ACT inhaler Inhale 2 puffs into the lungs every 4 (four) hours as needed for wheezing or shortness of breath. 02/24/20  Yes [provider]  aspirin EC 81 MG tablet Take 81 mg by mouth daily. Swallow whole.   Yes [provider]  baclofen (LIORESAL) 10 MG tablet Take 10 mg by mouth daily.   Yes [provider]  carvedilol (COREG) 12.5 MG tablet Take 1 tablet (12.5 mg total) by mouth 2 (two) times daily with a meal. 08/08/20  Yes Ghimire, Werner Lean, MD  Dextromethorphan-guaiFENesin 10-100 MG/5ML liquid TAKE 5 MLS BY MOUTH EVERY FOUR HOURS AS  NEEDED FOR COUGH. 05/18/20 05/18/21 Yes Theotis Barrio, MD  furosemide (LASIX) 40 MG tablet Take 0.5 tablets (20 mg total) by mouth daily. 05/18/20  Yes Theotis Barrio, MD  gabapentin (NEURONTIN) 100 MG capsule Take 200 mg by mouth 3 (three) times daily.   Yes [provider]  iron polysaccharides (NIFEREX) 150 MG capsule Take 1 capsule (150 mg total) by mouth daily. 05/18/20  Yes Theotis Barrio, MD  meclizine (ANTIVERT) 25 MG tablet Take 1 tablet (25 mg total) by mouth 3 (three) times daily as needed for dizziness. 08/08/20  Yes Ghimire, Werner Lean, MD  ondansetron (ZOFRAN-ODT) 4 MG disintegrating tablet Take 1 tablet (4 mg total) by mouth every 8 (eight) hours as needed for nausea or vomiting. 08/08/20  Yes Ghimire, Werner Lean, MD    Family History Family History  Problem Relation Age of Onset   Diabetes Mother    Heart attack Father 73   Hypertension Brother    Heart disease Brother    Diabetes Brother     Social History Social History   Tobacco Use   Smoking status: Former   Smokeless tobacco: Never  Building services engineer Use: Never used  Substance Use Topics   Alcohol use: No   Drug use: Never     Allergies   Iodine, Ferrous ammonium sulfate, and Shellfish allergy  Review of Systems Review of Systems Per HPI  Physical Exam Triage Vital Signs ED Triage Vitals  Enc Vitals Group     BP 02/16/21 0928 122/84     Pulse Rate 02/16/21 0928 67     Resp 02/16/21 0928 (!) 24     Temp 02/16/21 0928 98.4 F (36.9 C)     Temp Source 02/16/21 0928 Oral     SpO2 02/16/21 0928 95 %     Weight --      Height --      Head Circumference --      Peak Flow --      Pain Score 02/16/21 0932 8     Pain Loc --      Pain Edu? --      Excl. in GC? --    No data found.  Updated Vital Signs BP 122/84 (BP Location: Left Arm)   Pulse 67   Temp 98.4 F (36.9 C) (Oral)   Resp (!) 24   SpO2 95%   Visual Acuity Right Eye Distance:   Left Eye Distance:   Bilateral Distance:     Right Eye Near:   Left Eye Near:    Bilateral Near:     Physical Exam Constitutional:      General: He is not in acute distress.    Appearance: He is ill-appearing. He is not diaphoretic.  HENT:     Head: Normocephalic.     Right Ear: Tympanic membrane and ear canal normal.     Left Ear: Tympanic membrane and ear canal normal.     Nose: Nose normal.     Mouth/Throat:     Lips: Pink.     Mouth: Mucous membranes are moist.     Pharynx: No posterior oropharyngeal erythema.  Cardiovascular:     Rate and Rhythm: Normal rate and regular rhythm.     Pulses: Normal pulses.     Heart sounds: Normal heart sounds.  Pulmonary:     Effort: Pulmonary effort is normal. Tachypnea present. No respiratory distress.     Breath sounds: Normal breath sounds. No stridor. No wheezing or rhonchi.     Comments: Intermittent tachypnea especially with exertion.  Harsh cough on exam. Musculoskeletal:        General: Normal range of motion.  Skin:    General: Skin is warm and dry.  Neurological:     General: No focal deficit present.     Mental Status: He is alert and oriented to person, place, and time. Mental status is at baseline.  Psychiatric:        Mood and Affect: Mood normal.        Behavior: Behavior normal.        Thought Content: Thought content normal.        Judgment: Judgment normal.     UC Treatments / Results  Labs (all labs ordered are listed, but only abnormal results are displayed) Labs Reviewed - No data to display  EKG   Radiology No results found.  Procedures Procedures (including critical care time)  Medications Ordered in UC Medications - No data to display  Initial Impression / Assessment and Plan / UC Course  I have reviewed the triage vital signs and the nursing notes.  Pertinent labs & imaging results that were available during my care of the patient were reviewed by me and considered in my medical decision making (see chart for details).     Patient  does not appear  to be in any acute respiratory distress but is having intermittent tachypnea.  Patient also endorses shortness of breath.  Advised patient that he should go to the hospital for further evaluation management.  Patient was agreeable with plan.  Patient has granddaughter here that is able to transport him to the hospital.  Vital signs stable at discharge.  Agree with granddaughter transporting patient to hospital.Discussed strict return precautions. Patient verbalized understanding and is agreeable with plan.  Final Clinical Impressions(s) / UC Diagnoses   Final diagnoses:  Persistent cough  Tachypnea     Discharge Instructions      Please go to the hospital as soon as you leave urgent care for further evaluation and management.     ED Prescriptions   None    PDMP not reviewed this encounter.   Lance Muss, FNP 02/16/21 [phone]

## 2021-02-16 NOTE — ED Triage Notes (Signed)
Patient c/o dry cough x 2 weeks.  Patient having left sided "rib cage" pain that's worse at night.  Patient has taken Robitussin and Nyquil w/o relief.  Patient is vaccinated for COVID.

## 2021-02-16 NOTE — ED Provider Notes (Signed)
[facility] EMERGENCY DEPARTMENT Provider Note   CSN: [id number] Arrival date & time: 02/16/21  1016     History Chief Complaint  Patient presents with   Cough   Shortness of Breath    Tristan Gardner is a 86 y.o. male.  HPI He presents for evaluation of cough and shortness of breath.  He went to an urgent care and was referred here for further evaluation.  He complains of productive cough for 2 weeks, producing clear mucus.  He has shortness of breath at rest which gets worse with attempts at ambulation.  He denies fever, chills, chest pain, weakness or dizziness.  No prior similar problems.  He does not take pulmonary medications.  He does not think he has had a history of heart failure.  There are no other known active modifying factors    Past Medical History:  Diagnosis Date   Atrial fibrillation (HCC)    Coronary artery disease     Patient Active Problem List   Diagnosis Date Noted   Chest pain, rule out acute myocardial infarction 08/06/2020   Elevated blood pressure reading 08/06/2020   H/O iron deficiency anemia 08/06/2020   Atrial flutter (HCC) 05/18/2020   Viral URI with cough 05/18/2020   Atypical chest pain 05/17/2020   Atrial fibrillation (HCC) 03/11/2020    Past Surgical History:  Procedure Laterality Date   CARDIAC SURGERY     ROTATOR CUFF REPAIR         Family History  Problem Relation Age of Onset   Diabetes Mother    Heart attack Father 22   Hypertension Brother    Heart disease Brother    Diabetes Brother     Social History   Tobacco Use   Smoking status: Former   Smokeless tobacco: Never  Building services engineer Use: Never used  Substance Use Topics   Alcohol use: No   Drug use: Never    Home Medications Prior to Admission medications   Medication Sig Start Date End Date Taking? Authorizing Provider  doxycycline (VIBRAMYCIN) 100 MG capsule Take 1 capsule (100 mg total) by mouth 2 (two) times daily. One po bid x 7  days 02/16/21  Yes Mancel Bale, MD  predniSONE (DELTASONE) 20 MG tablet Take 1 tablet (20 mg total) by mouth 2 (two) times daily. 02/16/21  Yes Mancel Bale, MD  acetaminophen (TYLENOL) 500 MG tablet Take 1,000 mg by mouth every 6 (six) hours as needed for mild pain.    [provider]  albuterol (VENTOLIN HFA) 108 (90 Base) MCG/ACT inhaler Inhale 2 puffs into the lungs every 4 (four) hours as needed for wheezing or shortness of breath. 02/24/20   [provider]  aspirin EC 81 MG tablet Take 81 mg by mouth daily. Swallow whole.    [provider]  baclofen (LIORESAL) 10 MG tablet Take 10 mg by mouth daily.    [provider]  carvedilol (COREG) 12.5 MG tablet Take 1 tablet (12.5 mg total) by mouth 2 (two) times daily with a meal. 08/08/20   Ghimire, Werner Lean, MD  Dextromethorphan-guaiFENesin 10-100 MG/5ML liquid TAKE 5 MLS BY MOUTH EVERY FOUR HOURS AS NEEDED FOR COUGH. 05/18/20 05/18/21  Theotis Barrio, MD  furosemide (LASIX) 40 MG tablet Take 0.5 tablets (20 mg total) by mouth daily. 05/18/20   Theotis Barrio, MD  gabapentin (NEURONTIN) 100 MG capsule Take 200 mg by mouth 3 (three) times daily.    [provider]  iron polysaccharides (NIFEREX) 150 MG capsule Take 1 capsule (150 mg total) by mouth daily. 05/18/20   Theotis Barrio, MD  meclizine (ANTIVERT) 25 MG tablet Take 1 tablet (25 mg total) by mouth 3 (three) times daily as needed for dizziness. 08/08/20   Ghimire, Werner Lean, MD  ondansetron (ZOFRAN-ODT) 4 MG disintegrating tablet Take 1 tablet (4 mg total) by mouth every 8 (eight) hours as needed for nausea or vomiting. 08/08/20   Maretta Bees, MD    Allergies    Iodine, Ferrous ammonium sulfate, and Shellfish allergy  Review of Systems   Review of Systems  All other systems reviewed and are negative.  Physical Exam Updated Vital Signs BP (!) 143/124   Pulse 66   Temp 98 F (36.7 C) (Oral)   Resp (!) 33   Ht 5' 11.5" (1.816 m)   Wt  122.5 kg   SpO2 95%   BMI 37.13 kg/m   Physical Exam Vitals and nursing note reviewed.  Constitutional:      General: He is not in acute distress.    Appearance: He is well-developed. He is not ill-appearing, toxic-appearing or diaphoretic.  HENT:     Head: Normocephalic and atraumatic.     Right Ear: External ear normal.     Left Ear: External ear normal.  Eyes:     Conjunctiva/sclera: Conjunctivae normal.     Pupils: Pupils are equal, round, and reactive to light.  Neck:     Trachea: Phonation normal.  Cardiovascular:     Rate and Rhythm: Normal rate and regular rhythm.     Heart sounds: Normal heart sounds.     Comments: No JVD Pulmonary:     Effort: Pulmonary effort is normal. No tachypnea or bradypnea.     Breath sounds: Examination of the right-middle field reveals decreased breath sounds. Examination of the left-middle field reveals decreased breath sounds. Examination of the right-lower field reveals decreased breath sounds. Examination of the left-lower field reveals decreased breath sounds. Decreased breath sounds and rhonchi present. No wheezing or rales.  Chest:     Chest wall: No mass, deformity or tenderness.  Abdominal:     Palpations: Abdomen is soft.     Tenderness: There is no abdominal tenderness.  Musculoskeletal:        General: Normal range of motion.     Cervical back: Normal range of motion and neck supple.  Skin:    General: Skin is warm and dry.  Neurological:     Mental Status: He is alert and oriented to person, place, and time.     Cranial Nerves: No cranial nerve deficit.     Sensory: No sensory deficit.     Motor: No abnormal muscle tone.     Coordination: Coordination normal.  Psychiatric:        Mood and Affect: Mood is not anxious.        Behavior: Behavior normal.        Thought Content: Thought content normal.        Judgment: Judgment normal.    ED Results / Procedures / Treatments   Labs (all labs ordered are listed, but only  abnormal results are displayed) Labs Reviewed  BASIC METABOLIC PANEL - Abnormal; Notable for the following components:      Result Value   Glucose, Bld 117 (*)    BUN 7 (*)    All other components within normal limits  CBC - Abnormal; Notable for the following components:  WBC 3.6 (*)    All other components within normal limits  TROPONIN I (HIGH SENSITIVITY) - Abnormal; Notable for the following components:   Troponin I (High Sensitivity) 68 (*)    All other components within normal limits  TROPONIN I (HIGH SENSITIVITY) - Abnormal; Notable for the following components:   Troponin I (High Sensitivity) 58 (*)    All other components within normal limits  TROPONIN I (HIGH SENSITIVITY) - Abnormal; Notable for the following components:   Troponin I (High Sensitivity) 57 (*)    All other components within normal limits  TROPONIN I (HIGH SENSITIVITY) - Abnormal; Notable for the following components:   Troponin I (High Sensitivity) 58 (*)    All other components within normal limits  RESP PANEL BY RT-PCR (FLU A&B, COVID) [id number]  D-DIMER, QUANTITATIVE  BRAIN NATRIURETIC PEPTIDE    EKG EKG Interpretation  Date/Time:  Wednesday February 16 2021 10:37:06 EDT Ventricular Rate:  66 PR Interval:    QRS Duration: 88 QT Interval:  406 QTC Calculation: 425 R Axis:   -13 Text Interpretation: Atrial flutter with 4:1 A-V conduction Abnormal ECG since last tracing no significant change Confirmed by Mancel Bale [phone]) on 02/16/2021 5:59:15 PM  Radiology DG Chest 1 View  Result Date: 02/16/2021 CLINICAL DATA:  Left-sided chest pain with shortness of breath EXAM: CHEST  1 VIEW COMPARISON:  08/06/2020 FINDINGS: Normal heart size and mediastinal contours. Mild hypoinflation. No focal infiltrate or edema. No effusion or pneumothorax. No acute osseous findings. IMPRESSION: Stable portable chest. Electronically Signed   By: Marnee Spring M.D.   On: 02/16/2021 11:17    Procedures .Critical  Care  Date/Time: 02/16/2021 10:08 PM Performed by: Mancel Bale, MD Authorized by: Mancel Bale, MD   Critical care provider statement:    Critical care time (minutes):  35   Critical care start time:  02/16/2021 5:55 PM   Critical care end time:  02/16/2021 10:08 PM   Critical care time was exclusive of:  Separately billable procedures and treating other patients   Critical care was necessary to treat or prevent imminent or life-threatening deterioration of the following conditions:  Respiratory failure   Critical care was time spent personally by me on the following activities:  Blood draw for specimens, development of treatment plan with patient or surrogate, discussions with consultants, evaluation of patient's response to treatment, examination of patient, obtaining history from patient or surrogate, ordering and performing treatments and interventions, ordering and review of laboratory studies, pulse oximetry, re-evaluation of patient's condition, review of old charts and ordering and review of radiographic studies   Medications Ordered in ED Medications  albuterol (VENTOLIN HFA) 108 (90 Base) MCG/ACT inhaler 2 puff (has no administration in time range)  albuterol (PROVENTIL) (2.5 MG/3ML) 0.083% nebulizer solution 5 mg (5 mg Nebulization Given 02/16/21 1935)  methylPREDNISolone sodium succinate (SOLU-MEDROL) 125 mg/2 mL injection 125 mg (125 mg Intravenous Given 02/16/21 1932)    ED Course  I have reviewed the triage vital signs and the nursing notes.  Pertinent labs & imaging results that were available during my care of the patient were reviewed by me and considered in my medical decision making (see chart for details).    MDM Rules/Calculators/A&P                            Patient Vitals for the past 24 hrs:  BP Temp Temp src Pulse Resp SpO2 Height Weight  02/16/21 2115 Marland Kitchen)  143/124 -- -- 66 (!) 33 95 % -- --  02/16/21 2030 (!) 147/105 -- -- (!) 46 (!) 27 97 % -- --   02/16/21 1930 (!) 161/70 -- -- 64 16 100 % -- --  02/16/21 1755 -- 98 F (36.7 C) Oral -- -- -- -- --  02/16/21 1755 (!) 173/94 -- -- 65 (!) 22 97 % -- --  02/16/21 1643 (!) 161/96 -- -- 65 16 98 % -- --  02/16/21 1327 (!) 139/101 -- -- 65 17 100 % -- --  02/16/21 1040 (!) 123/58 -- -- -- -- -- -- --  02/16/21 1039 -- -- -- 63 20 97 % 5' 11.5" (1.816 m) 122.5 kg  02/16/21 1021 -- 98 F (36.7 C) Oral -- -- -- -- --    10:11 PM Reevaluation with update and discussion. After initial assessment and treatment, an updated evaluation reveals he is comfortable now and reports that he is breathing better and able to move his mucous better.  Findings discussed with patient as well as his daughter by telephone, all questions were answered. Mancel Bale   Medical Decision Making:  This patient is presenting for evaluation of cough for 2 weeks with shortness of breath, which does require a range of treatment options, and is a complaint that involves a moderate risk of morbidity and mortality. The differential diagnoses include CHF, PE, pneumonia, bronchitis, viral infection. I decided to review old records, and in summary Ehly male presenting with subacute symptoms, which are persistent despite rest and fluids.  Has history of atrial fibrillation, is not anticoagulated.  No history of congestive heart failure.  I do not recall additional historical information from anyone.  Clinical Laboratory Tests Ordered, included CBC, Metabolic panel, and delta troponin, viral panel, BMP, D-dimer . Review indicates initial studies, stable mild elevation of troponin, higher than baseline, additional troponin ordered. Remainder reassuring. Radiologic Tests Ordered, included chest x-ray.  I independently Visualized: Radiograph images, which show no infiltrate or edema  Cardiac Monitor Tracing which shows atrial fibrillation, rate 84   Critical Interventions-clinical evaluation, laboratory testing, chest x-ray,  medication treatment, observation and reassessment  After These Interventions, the Patient was reevaluated and was found stable for discharge.  Patient with chest pain likely related to bronchitis, without evidence for pneumonia, ACS or severe infection.  Mild chronically elevated troponin which is stable during ED evaluation on 4 different blood draws.  He can be managed in the outpatient setting.  CRITICAL CARE-yes Performed by: Mancel Bale    Nursing Notes Reviewed/ Care Coordinated Applicable Imaging Reviewed Interpretation of Laboratory Data incorporated into ED treatment  The patient appears reasonably screened and/or stabilized for discharge and I doubt any other medical condition or other [facility] requiring further screening, evaluation, or treatment in the ED at this time prior to discharge.  Plan: Home Medications-continue usual, use albuterol inhaler 2 puffs every 3-4 hours as needed for cough or trouble breathing.  Take prescribed medications.; Home Treatments-rest, fluids, gradually increase activity; return here if the recommended treatment, does not improve the symptoms; Recommended follow up-PCP for checkup in 1 week.        Final Clinical Impression(s) / ED Diagnoses Final diagnoses:  Acute bronchitis, unspecified organism  Hypertension, unspecified type    Rx / DC Orders ED Discharge Orders          Ordered    doxycycline (VIBRAMYCIN) 100 MG capsule  2 times daily        02/16/21 2205  predniSONE (DELTASONE) 20 MG tablet  2 times daily        02/16/21 2205             Mancel BaleWentz, Particia Strahm, MD 02/16/21 2211

## 2021-02-16 NOTE — ED Notes (Signed)
Provider at bedside at this time

## 2021-02-16 NOTE — Discharge Instructions (Addendum)
Please go to the hospital as soon as you leave urgent care for further evaluation and management. 

## 2021-02-16 NOTE — ED Provider Notes (Signed)
Emergency Medicine Provider Triage Evaluation Note  Tristan Gardner , a 86 y.o. male  was evaluated in triage.  Pt complains of left lower CP with cough x 2 weeks with SHOB. Went to UC sent to the ER for possible PNA.  Review of Systems  Positive: CP, SHOB, cough Negative: Fevers, exertional pain  Physical Exam  BP (!) 123/58   Pulse 63   Temp 98 F (36.7 C) (Oral)   Resp 20   Ht 5' 11.5" (1.816 m)   Wt 122.5 kg   SpO2 97%   BMI 37.13 kg/m  Gen:   Awake, no distress   Resp:  Normal effort  MSK:   Moves extremities without difficulty  Other:  Frequent harsh cough  Medical Decision Making  Medically screening exam initiated at 10:41 AM.  Appropriate orders placed.  Tristan Gardner was informed that the remainder of the evaluation will be completed by another provider, this initial triage assessment does not replace that evaluation, and the importance of remaining in the ED until their evaluation is complete.     Tristan Fend, PA-C 02/16/21 1041    Tristan Sleeper, MD 02/16/21 1049

## 2021-02-16 NOTE — ED Notes (Signed)
Provided something to eat and drink at this time °

## 2021-02-16 NOTE — ED Notes (Signed)
Lab at bedside at this time.  

## 2021-02-16 NOTE — Discharge Instructions (Addendum)
The testing today does not show any serious problems.  We are going to treat you for bronchitis with albuterol, prednisone, and doxycycline.  These medicine should help you feel better and improve your condition in a few days.  Watch for worsening symptoms including trouble breathing, worsening pain, difficulty eating or inability to take medications.  See your primary care doctor for checkup in a week or so.  We sent prescriptions to your pharmacy to take, beginning tomorrow morning.  Use the albuterol inhaler 2 puffs every 3-4 hours as needed for cough or trouble breathing.  If you have a fever take Tylenol every 4 hours.

## 2021-02-16 NOTE — ED Notes (Signed)
Provided warm blanket at this time pt c/o being cold

## 2021-06-08 ENCOUNTER — Ambulatory Visit
Admission: EM | Admit: 2021-06-08 | Discharge: 2021-06-08 | Disposition: A | Discharge status: Home / Self Care | Payer: Medicare PPO

## 2021-06-08 ENCOUNTER — Encounter: Payer: Self-pay | Admitting: Emergency Medicine

## 2021-06-08 DIAGNOSIS — H5711 Ocular pain, right eye: Secondary | ICD-10-CM | POA: Diagnosis not present

## 2021-06-08 NOTE — Discharge Instructions (Signed)
Please go to the provided contact information and address for [facility] tomorrow at 8 AM for further evaluation and management.  Dr. Dione Booze also advised that you may call his cell phone number if you have any issues overnight.  The phone number is [phone].

## 2021-06-08 NOTE — ED Triage Notes (Signed)
C/o right eye pain/irritation over the last week. Believes he got something in it. Hasn't had anyone have time to take him to the doctors office. Now giving him a headache.

## 2021-06-08 NOTE — ED Provider Notes (Signed)
[facility]    CSN: [id number] Arrival date & time: 06/08/21  1638      History   Chief Complaint Chief Complaint  Patient presents with   Eye Pain    HPI Tristan Gardner is a 86 y.o. male.   Patient presents with right eye pain and irritation that has been present for approximately 1 week.  Patient reports that pain is rated 10/10 on pain scale and is a constant pain that is exacerbated with eye movement.  Patient reports that he may have gotten something in it but is not sure.  Denies any apparent trauma to the eye.  Patient reports that he does have history of "retinal surgery" a few years prior but is not sure the name of the surgery.  Patient reports that this sensation and pain does feel similar to his retina issue in the past.  Patient also having some associated blurry vision.  Denies any drainage from the eye. He does wear glasses but not contacts.    Eye Pain   Past Medical History:  Diagnosis Date   Atrial fibrillation [facility])    Coronary artery disease     Patient Active Problem List   Diagnosis Date Noted   Chest pain, rule out acute myocardial infarction 08/06/2020   Elevated blood pressure reading 08/06/2020   H/O iron deficiency anemia 08/06/2020   Atrial flutter ([place]) 05/18/2020   Viral URI with cough 05/18/2020   Atypical chest pain 05/17/2020   Atrial fibrillation ([place]) 03/11/2020    Past Surgical History:  Procedure Laterality Date   CARDIAC SURGERY     ROTATOR CUFF REPAIR         Home Medications    Prior to Admission medications   Medication Sig Start Date End Date Taking? Authorizing Provider  acetaminophen (TYLENOL) 500 MG tablet Take 1,000 mg by mouth every 6 (six) hours as needed for mild pain.    [provider]  albuterol (VENTOLIN HFA) 108 (90 Base) MCG/ACT inhaler Inhale 2 puffs into the lungs every 4 (four) hours as needed for wheezing or shortness of breath. 02/24/20   [provider]  aspirin EC 81 MG  tablet Take 81 mg by mouth daily. Swallow whole.    [provider]  baclofen (LIORESAL) 10 MG tablet Take 10 mg by mouth daily.    [provider]  carvedilol (COREG) 12.5 MG tablet Take 1 tablet (12.5 mg total) by mouth 2 (two) times daily with a meal. 08/08/20   Ghimire, Henreitta Leber, MD  doxycycline (VIBRAMYCIN) 100 MG capsule Take 1 capsule (100 mg total) by mouth 2 (two) times daily. One po bid x 7 days 02/16/21   Daleen Bo, MD  furosemide (LASIX) 40 MG tablet Take 0.5 tablets (20 mg total) by mouth daily. 05/18/20   Mosetta Anis, MD  gabapentin (NEURONTIN) 100 MG capsule Take 200 mg by mouth 3 (three) times daily.    [provider]  iron polysaccharides (NIFEREX) 150 MG capsule Take 1 capsule (150 mg total) by mouth daily. 05/18/20   Mosetta Anis, MD  meclizine (ANTIVERT) 25 MG tablet Take 1 tablet (25 mg total) by mouth 3 (three) times daily as needed for dizziness. 08/08/20   Ghimire, Henreitta Leber, MD  ondansetron (ZOFRAN-ODT) 4 MG disintegrating tablet Take 1 tablet (4 mg total) by mouth every 8 (eight) hours as needed for nausea or vomiting. 08/08/20   Ghimire, Henreitta Leber, MD  predniSONE (DELTASONE) 20 MG tablet Take 1 tablet (  20 mg total) by mouth 2 (two) times daily. 02/16/21   Mancel Bale, MD    Family History Family History  Problem Relation Age of Onset   Diabetes Mother    Heart attack Father 73   Hypertension Brother    Heart disease Brother    Diabetes Brother     Social History Social History   Tobacco Use   Smoking status: Former   Smokeless tobacco: Never  Building services engineer Use: Never used  Substance Use Topics   Alcohol use: No   Drug use: Never     Allergies   Iodine, Ferrous ammonium sulfate, and Shellfish allergy   Review of Systems Review of Systems Per HPI  Physical Exam Triage Vital Signs ED Triage Vitals  Enc Vitals Group     BP 06/08/21 1647 112/72     Pulse Rate 06/08/21 1647 71     Resp 06/08/21 1647 16      Temp 06/08/21 1647 98 F (36.7 C)     Temp Source 06/08/21 1647 Oral     SpO2 06/08/21 1647 93 %     Weight --      Height --      Head Circumference --      Peak Flow --      Pain Score 06/08/21 1651 10     Pain Loc --      Pain Edu? --      Excl. in GC? --    No data found.  Updated Vital Signs BP 112/72 (BP Location: Left Arm)   Pulse 71   Temp 98 F (36.7 C) (Oral)   Resp 16   SpO2 93%   Visual Acuity Right Eye Distance: 20/70 Left Eye Distance: 20/70 Bilateral Distance: 20/70  Right Eye Near:   Left Eye Near:    Bilateral Near:     Physical Exam Constitutional:      General: He is not in acute distress.    Appearance: Normal appearance. He is not toxic-appearing or diaphoretic.  HENT:     Head: Normocephalic and atraumatic.  Eyes:     General: Lids are normal. Lids are everted, no foreign bodies appreciated. Gaze aligned appropriately.        Right eye: No foreign body, discharge or hordeolum.        Left eye: No foreign body, discharge or hordeolum.     Extraocular Movements: Extraocular movements intact.     Conjunctiva/sclera: Conjunctivae normal.     Pupils:     Left eye: No corneal abrasion or fluorescein uptake.     Comments: Pupils reactive to light.  Right pupil is irregularly shaped.  No obvious foreign body.  No redness noted to entirety of eye.  Fluorescein stain completed and no reuptake noted.  No signs of corneal abrasion.  Pulmonary:     Effort: Pulmonary effort is normal.  Neurological:     General: No focal deficit present.     Mental Status: He is alert and oriented to person, place, and time. Mental status is at baseline.  Psychiatric:        Mood and Affect: Mood normal.        Behavior: Behavior normal.        Thought Content: Thought content normal.        Judgment: Judgment normal.     UC Treatments / Results  Labs (all labs ordered are listed, but only abnormal results are displayed) Labs Reviewed - No data  to  display  EKG   Radiology No results found.  Procedures Procedures (including critical care time)  Medications Ordered in UC Medications - No data to display  Initial Impression / Assessment and Plan / UC Course  I have reviewed the triage vital signs and the nursing notes.  Pertinent labs & imaging results that were available during my care of the patient were reviewed by me and considered in my medical decision making (see chart for details).     Highly suspicious the patient could be having complications of retina given patient's history and reporting that this pain feels similar.  No obvious abnormalities on physical exam and with fluorescein stain.  Dr. Katy Fitch (on call ophthalmology) was called to discuss patient's symptoms.  He would like to see patient tomorrow morning at 8 AM for further evaluation and management.  Patient and patient's daughter was provided with contact information address for patient to follow-up.  Patient voiced understanding and was agreeable to plan. Final Clinical Impressions(s) / UC Diagnoses   Final diagnoses:  Acute right eye pain     Discharge Instructions      Please go to the provided contact information and address for [facility] tomorrow at 8 AM for further evaluation and management.  Dr. Katy Fitch also advised that you may call his cell phone number if you have any issues overnight.  The phone number is [phone].     ED Prescriptions   None    PDMP not reviewed this encounter.   Teodora Medici, [place] 06/08/21 [id number]

## 2021-08-20 IMAGING — CT CT ANGIO CHEST
2 of 8 series · 17 of 46 positions shown · IV contrast (omnipaque)
Comparison: Chest x-ray 03/11/2020

CLINICAL DATA: Shortness of breath, history of atrial fibrillation,
suspected pulmonary embolus.

EXAM:
CT ANGIOGRAPHY CHEST WITH CONTRAST
TECHNIQUE: Multidetector CT imaging of the chest was performed using the
standard protocol during bolus administration of intravenous
contrast. Multiplanar CT image reconstructions and MIPs were
obtained to evaluate the vascular anatomy.
CONTRAST:  100mL OMNIPAQUE IOHEXOL 350 MG/ML SOLN

[Series 7: thins · axial · 0.97mm/px · z∈[+1112,+1322]mm · 14 of 242 slices shown]
[im 16/242  lung]
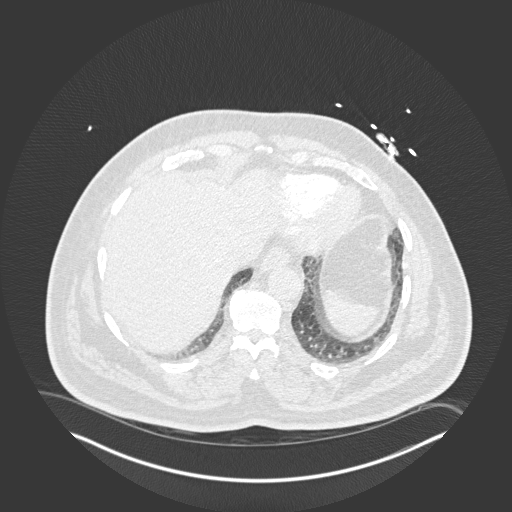
[im 31/242  soft-tissue]
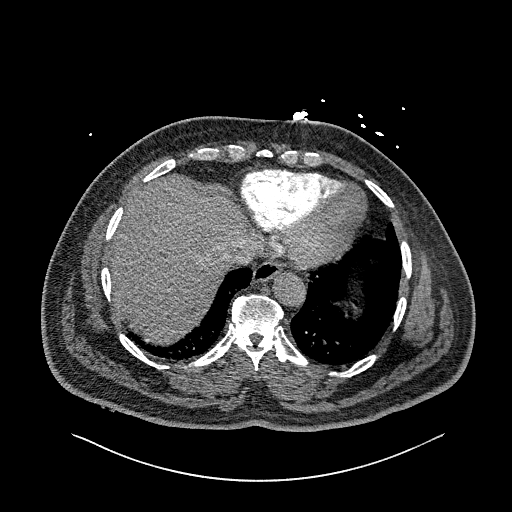
[im 46/242  lung]
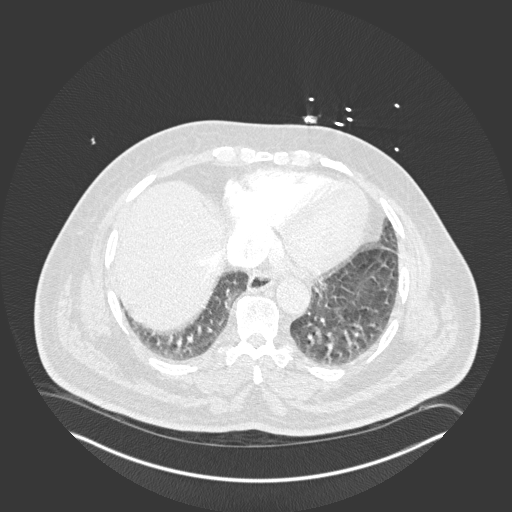
[im 61/242  soft-tissue]
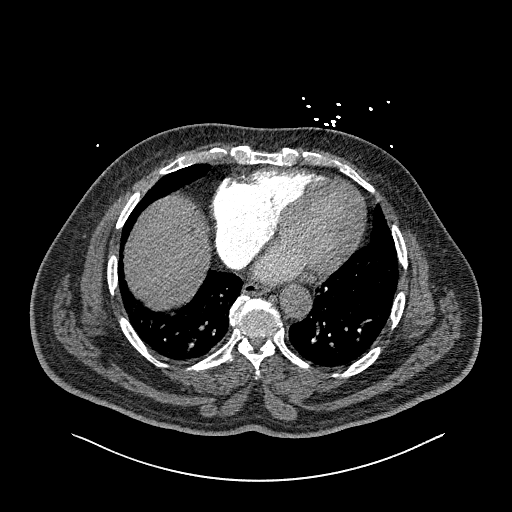
[im 76/242  lung]
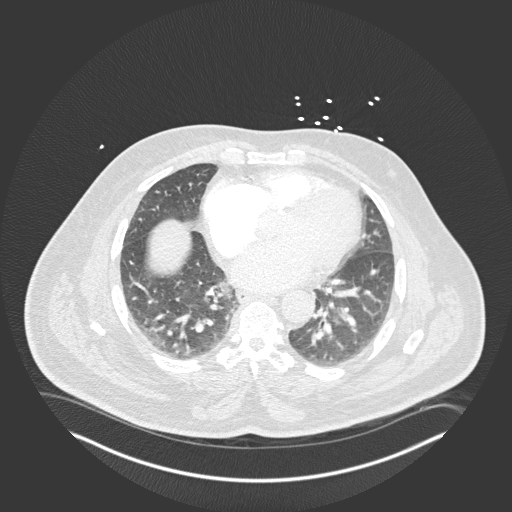
[im 91/242  soft-tissue]
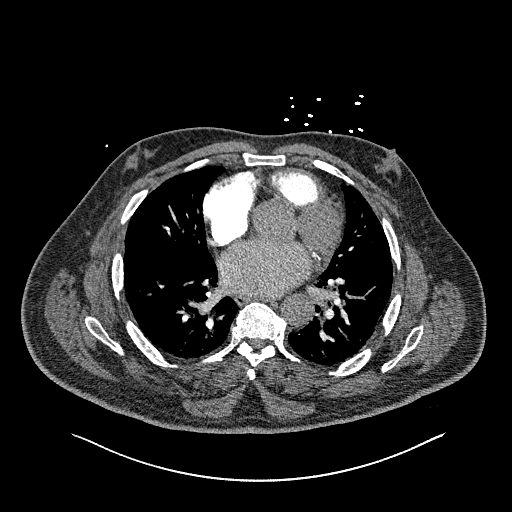
[im 106/242  lung]
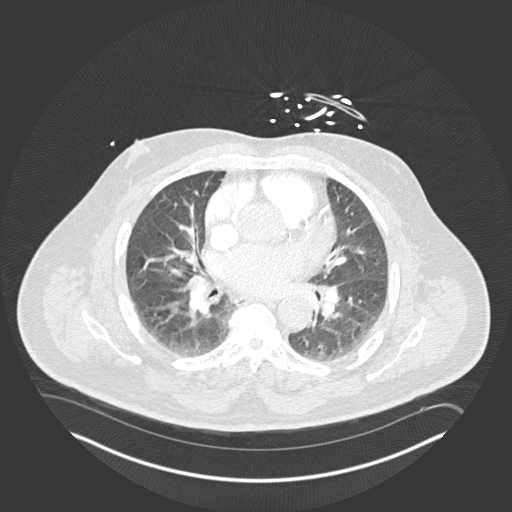
[im 136/242  soft-tissue]
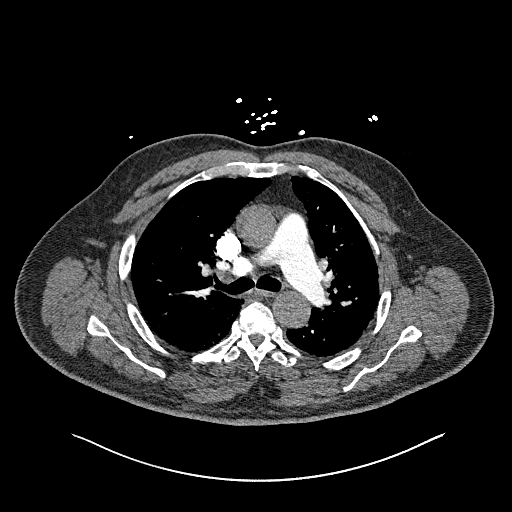
[im 151/242  lung]
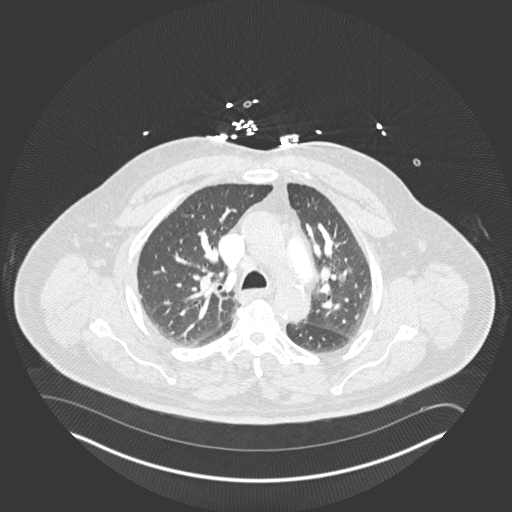
[im 166/242  soft-tissue]
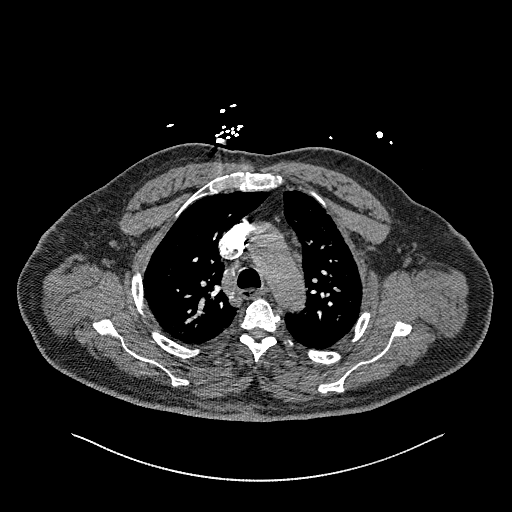
[im 181/242  lung]
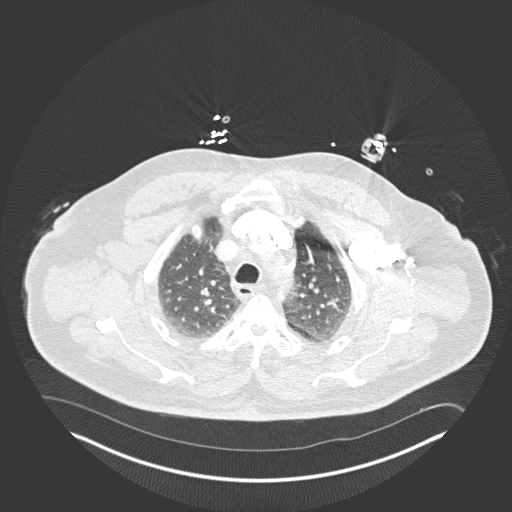
[im 196/242  soft-tissue]
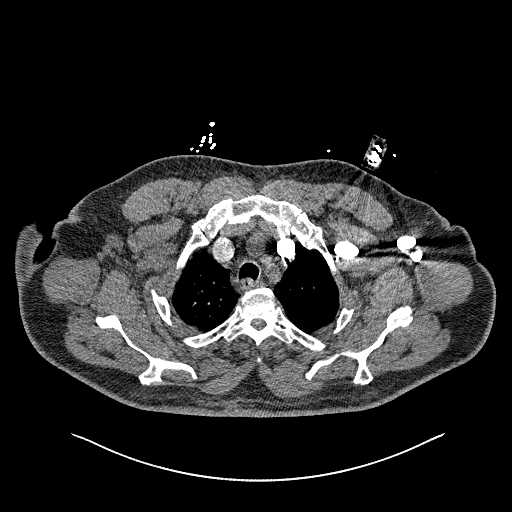
[im 211/242  lung]
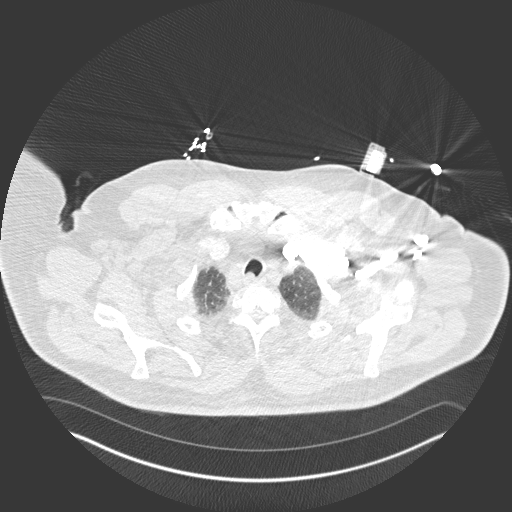
[im 226/242  soft-tissue]
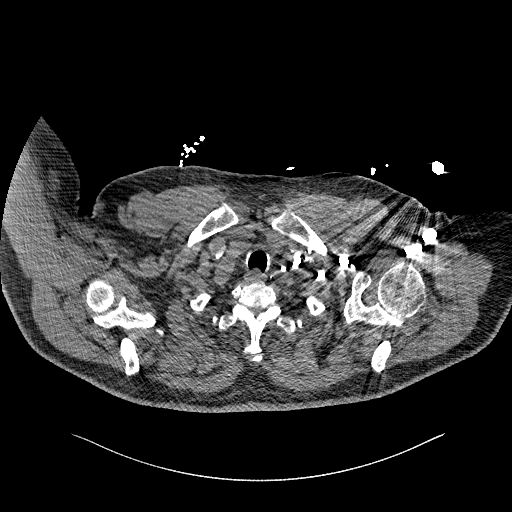

[Series 9: coronal mpr · coronal · 0.49mm/px · 3 of 163 slices shown]
[im 41/163  soft-tissue]
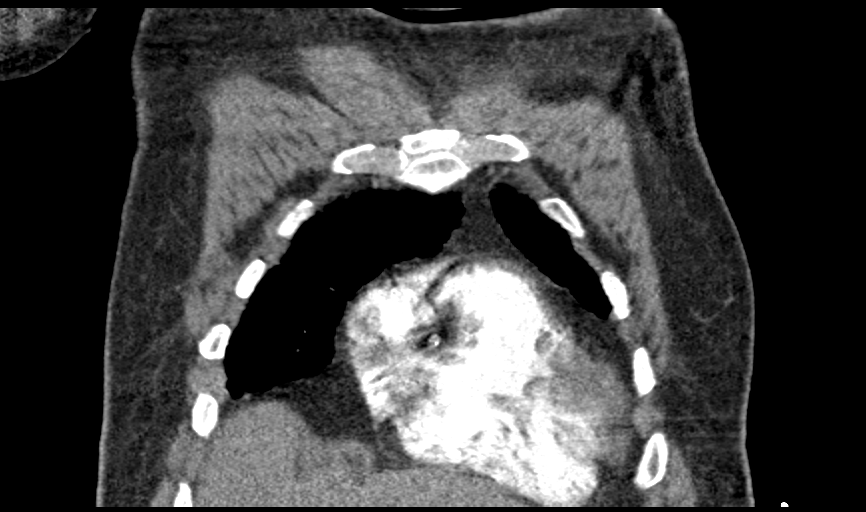
[im 82/163  soft-tissue]
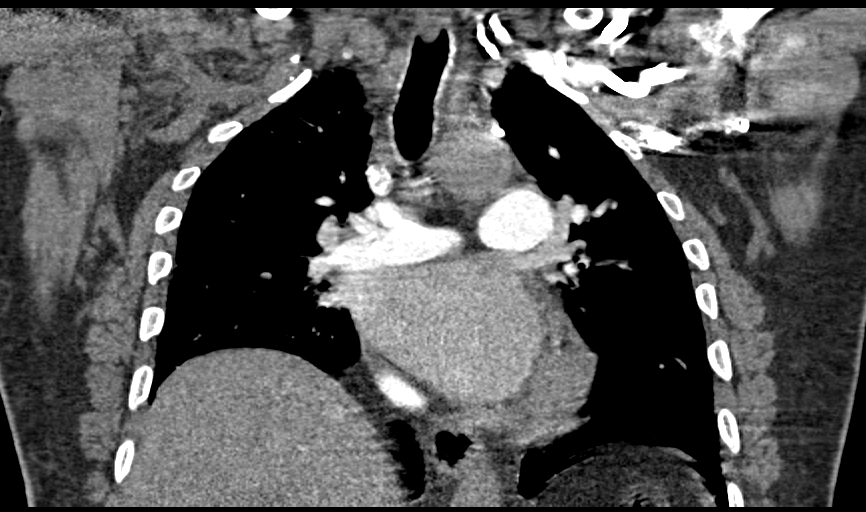
[im 122/163  soft-tissue]
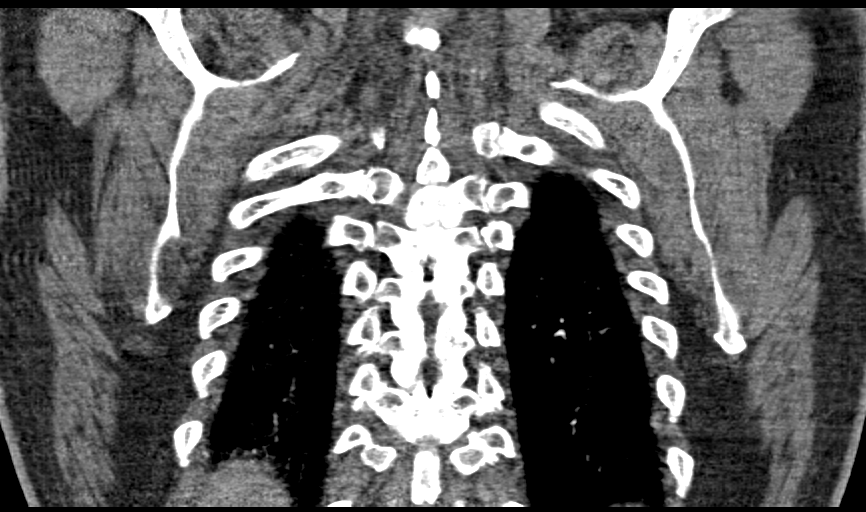

[17 of 46 positions shown; findings below may reference images not displayed]

FINDINGS: Cardiovascular: Satisfactory opacification of the pulmonary arteries
to the segmental level. Unable to evaluate at the subsegmental level
due to timing of intravenous contrast and respiratory motion
artifact. No evidence of pulmonary embolism. Normal heart size. No
significant pericardial effusion. The thoracic aorta is normal in
caliber. There is mild atherosclerotic plaque of the thoracic aorta.
At least mild four-vessel coronary artery calcifications.

Mediastinum/Nodes: Several prominent and minimally enlarged
calcified and noncalcified mediastinal and right hilar lymph nodes
are identified. No left hilar or axillary lymph nodes. Thyroid
gland, trachea, and esophagus demonstrate no significant findings.

Lungs/Pleura: Expiratory phase of respiration with bilateral lower
lobe subsegmental atelectasis. No focal consolidation. Limited
evaluation for pulmonary micronodules due to respiratory motion
artifact. Otherwise no pulmonary nodule identified. No pulmonary
mass. No pleural effusion. No pneumothorax.

Upper Abdomen: No acute abnormality.

Musculoskeletal:

Bilateral gynecomastia.

No suspicious lytic or blastic osseous lesions. No acute displaced
fracture. Multilevel moderate degenerative changes of the spine.

Review of the MIP images confirms the above findings.
IMPRESSION: No central or segmental pulmonary embolus. Limited evaluation of the
subsegmental level due to timing of intravenous contrast and motion
artifact.

No acute intrathoracic abnormality.

At least mild four-vessel coronary artery calcifications.

Aortic Atherosclerosis ([id number]-EK0.0).

## 2022-10-17 ENCOUNTER — Other Ambulatory Visit: Payer: Self-pay

## 2022-10-17 ENCOUNTER — Inpatient Hospital Stay (HOSPITAL_COMMUNITY)
Admission: EM | Admit: 2022-10-17 | Discharge: 2022-10-24 | DRG: 378 | Disposition: A | Discharge status: Home Health | Payer: Medicare PPO | Attending: Internal Medicine | Admitting: Internal Medicine

## 2022-10-17 ENCOUNTER — Encounter (HOSPITAL_COMMUNITY): Payer: Self-pay

## 2022-10-17 DIAGNOSIS — K250 Acute gastric ulcer with hemorrhage: Secondary | ICD-10-CM | POA: Diagnosis not present

## 2022-10-17 DIAGNOSIS — D62 Acute posthemorrhagic anemia: Secondary | ICD-10-CM | POA: Diagnosis present

## 2022-10-17 DIAGNOSIS — Z79899 Other long term (current) drug therapy: Secondary | ICD-10-CM

## 2022-10-17 DIAGNOSIS — R531 Weakness: Secondary | ICD-10-CM | POA: Diagnosis not present

## 2022-10-17 DIAGNOSIS — Z7902 Long term (current) use of antithrombotics/antiplatelets: Secondary | ICD-10-CM

## 2022-10-17 DIAGNOSIS — Z888 Allergy status to other drugs, medicaments and biological substances status: Secondary | ICD-10-CM

## 2022-10-17 DIAGNOSIS — H9193 Unspecified hearing loss, bilateral: Secondary | ICD-10-CM | POA: Diagnosis present

## 2022-10-17 DIAGNOSIS — K5520 Angiodysplasia of colon without hemorrhage: Secondary | ICD-10-CM | POA: Diagnosis present

## 2022-10-17 DIAGNOSIS — I4891 Unspecified atrial fibrillation: Secondary | ICD-10-CM | POA: Diagnosis present

## 2022-10-17 DIAGNOSIS — I10 Essential (primary) hypertension: Secondary | ICD-10-CM | POA: Diagnosis present

## 2022-10-17 DIAGNOSIS — I482 Chronic atrial fibrillation, unspecified: Secondary | ICD-10-CM | POA: Diagnosis present

## 2022-10-17 DIAGNOSIS — Z7982 Long term (current) use of aspirin: Secondary | ICD-10-CM

## 2022-10-17 DIAGNOSIS — E861 Hypovolemia: Secondary | ICD-10-CM | POA: Diagnosis present

## 2022-10-17 DIAGNOSIS — Z833 Family history of diabetes mellitus: Secondary | ICD-10-CM

## 2022-10-17 DIAGNOSIS — I959 Hypotension, unspecified: Secondary | ICD-10-CM | POA: Diagnosis present

## 2022-10-17 DIAGNOSIS — I252 Old myocardial infarction: Secondary | ICD-10-CM

## 2022-10-17 DIAGNOSIS — E872 Acidosis, unspecified: Secondary | ICD-10-CM | POA: Diagnosis present

## 2022-10-17 DIAGNOSIS — Z7952 Long term (current) use of systemic steroids: Secondary | ICD-10-CM

## 2022-10-17 DIAGNOSIS — Z87891 Personal history of nicotine dependence: Secondary | ICD-10-CM

## 2022-10-17 DIAGNOSIS — K319 Disease of stomach and duodenum, unspecified: Secondary | ICD-10-CM | POA: Diagnosis present

## 2022-10-17 DIAGNOSIS — I2510 Atherosclerotic heart disease of native coronary artery without angina pectoris: Secondary | ICD-10-CM | POA: Diagnosis present

## 2022-10-17 DIAGNOSIS — Z7901 Long term (current) use of anticoagulants: Secondary | ICD-10-CM

## 2022-10-17 DIAGNOSIS — G8929 Other chronic pain: Secondary | ICD-10-CM | POA: Diagnosis present

## 2022-10-17 DIAGNOSIS — K922 Gastrointestinal hemorrhage, unspecified: Secondary | ICD-10-CM

## 2022-10-17 DIAGNOSIS — Z8673 Personal history of transient ischemic attack (TIA), and cerebral infarction without residual deficits: Secondary | ICD-10-CM

## 2022-10-17 DIAGNOSIS — K219 Gastro-esophageal reflux disease without esophagitis: Secondary | ICD-10-CM | POA: Diagnosis present

## 2022-10-17 DIAGNOSIS — Z8249 Family history of ischemic heart disease and other diseases of the circulatory system: Secondary | ICD-10-CM

## 2022-10-17 DIAGNOSIS — N179 Acute kidney failure, unspecified: Secondary | ICD-10-CM | POA: Diagnosis present

## 2022-10-17 DIAGNOSIS — J449 Chronic obstructive pulmonary disease, unspecified: Secondary | ICD-10-CM | POA: Diagnosis present

## 2022-10-17 DIAGNOSIS — R195 Other fecal abnormalities: Secondary | ICD-10-CM

## 2022-10-17 DIAGNOSIS — Z91013 Allergy to seafood: Secondary | ICD-10-CM

## 2022-10-17 DIAGNOSIS — D649 Anemia, unspecified: Principal | ICD-10-CM | POA: Diagnosis present

## 2022-10-17 DIAGNOSIS — Z91041 Radiographic dye allergy status: Secondary | ICD-10-CM

## 2022-10-17 DIAGNOSIS — D509 Iron deficiency anemia, unspecified: Secondary | ICD-10-CM

## 2022-10-17 DIAGNOSIS — R030 Elevated blood-pressure reading, without diagnosis of hypertension: Secondary | ICD-10-CM | POA: Diagnosis present

## 2022-10-17 HISTORY — DX: Cerebral infarction, unspecified: I63.9

## 2022-10-17 HISTORY — DX: Essential (primary) hypertension: I10

## 2022-10-17 HISTORY — DX: Non-ST elevation (NSTEMI) myocardial infarction: I21.4

## 2022-10-17 LAB — CBG MONITORING, ED: Glucose-Capillary: 111 mg/dL — ABNORMAL HIGH (ref 70–99)

## 2022-10-17 NOTE — ED Triage Notes (Signed)
Patient BIB [facility] from home with complaint of chronic left leg pain. Patient reports pain worse today. Denies injury.  Per EMS family reported to them that patient fell last Friday 10/13/22 (GLF in bathroom).   +thinners

## 2022-10-18 ENCOUNTER — Emergency Department (HOSPITAL_COMMUNITY): Payer: Medicare PPO

## 2022-10-18 ENCOUNTER — Encounter (HOSPITAL_COMMUNITY): Payer: Self-pay | Admitting: Internal Medicine

## 2022-10-18 DIAGNOSIS — I482 Chronic atrial fibrillation, unspecified: Secondary | ICD-10-CM | POA: Diagnosis present

## 2022-10-18 DIAGNOSIS — I4891 Unspecified atrial fibrillation: Secondary | ICD-10-CM | POA: Diagnosis not present

## 2022-10-18 DIAGNOSIS — I10 Essential (primary) hypertension: Secondary | ICD-10-CM | POA: Diagnosis present

## 2022-10-18 DIAGNOSIS — K219 Gastro-esophageal reflux disease without esophagitis: Secondary | ICD-10-CM | POA: Diagnosis not present

## 2022-10-18 DIAGNOSIS — Z79899 Other long term (current) drug therapy: Secondary | ICD-10-CM | POA: Diagnosis not present

## 2022-10-18 DIAGNOSIS — R531 Weakness: Secondary | ICD-10-CM | POA: Diagnosis present

## 2022-10-18 DIAGNOSIS — K922 Gastrointestinal hemorrhage, unspecified: Secondary | ICD-10-CM

## 2022-10-18 DIAGNOSIS — R195 Other fecal abnormalities: Secondary | ICD-10-CM | POA: Diagnosis not present

## 2022-10-18 DIAGNOSIS — I2510 Atherosclerotic heart disease of native coronary artery without angina pectoris: Secondary | ICD-10-CM | POA: Diagnosis present

## 2022-10-18 DIAGNOSIS — Z91013 Allergy to seafood: Secondary | ICD-10-CM | POA: Diagnosis not present

## 2022-10-18 DIAGNOSIS — K319 Disease of stomach and duodenum, unspecified: Secondary | ICD-10-CM | POA: Diagnosis not present

## 2022-10-18 DIAGNOSIS — I959 Hypotension, unspecified: Secondary | ICD-10-CM | POA: Diagnosis present

## 2022-10-18 DIAGNOSIS — N179 Acute kidney failure, unspecified: Secondary | ICD-10-CM | POA: Diagnosis present

## 2022-10-18 DIAGNOSIS — I252 Old myocardial infarction: Secondary | ICD-10-CM | POA: Diagnosis not present

## 2022-10-18 DIAGNOSIS — Z7982 Long term (current) use of aspirin: Secondary | ICD-10-CM | POA: Diagnosis not present

## 2022-10-18 DIAGNOSIS — D649 Anemia, unspecified: Secondary | ICD-10-CM | POA: Diagnosis present

## 2022-10-18 DIAGNOSIS — Z91041 Radiographic dye allergy status: Secondary | ICD-10-CM | POA: Diagnosis not present

## 2022-10-18 DIAGNOSIS — D509 Iron deficiency anemia, unspecified: Secondary | ICD-10-CM | POA: Diagnosis present

## 2022-10-18 DIAGNOSIS — Z7901 Long term (current) use of anticoagulants: Secondary | ICD-10-CM | POA: Diagnosis not present

## 2022-10-18 DIAGNOSIS — Z7902 Long term (current) use of antithrombotics/antiplatelets: Secondary | ICD-10-CM

## 2022-10-18 DIAGNOSIS — Z87891 Personal history of nicotine dependence: Secondary | ICD-10-CM | POA: Diagnosis not present

## 2022-10-18 DIAGNOSIS — K259 Gastric ulcer, unspecified as acute or chronic, without hemorrhage or perforation: Secondary | ICD-10-CM | POA: Diagnosis not present

## 2022-10-18 DIAGNOSIS — J449 Chronic obstructive pulmonary disease, unspecified: Secondary | ICD-10-CM | POA: Diagnosis present

## 2022-10-18 DIAGNOSIS — K250 Acute gastric ulcer with hemorrhage: Secondary | ICD-10-CM | POA: Diagnosis present

## 2022-10-18 DIAGNOSIS — Z833 Family history of diabetes mellitus: Secondary | ICD-10-CM | POA: Diagnosis not present

## 2022-10-18 DIAGNOSIS — K5520 Angiodysplasia of colon without hemorrhage: Secondary | ICD-10-CM | POA: Diagnosis not present

## 2022-10-18 DIAGNOSIS — Z8673 Personal history of transient ischemic attack (TIA), and cerebral infarction without residual deficits: Secondary | ICD-10-CM | POA: Diagnosis not present

## 2022-10-18 DIAGNOSIS — Z888 Allergy status to other drugs, medicaments and biological substances status: Secondary | ICD-10-CM | POA: Diagnosis not present

## 2022-10-18 DIAGNOSIS — K921 Melena: Secondary | ICD-10-CM | POA: Diagnosis not present

## 2022-10-18 DIAGNOSIS — R1032 Left lower quadrant pain: Secondary | ICD-10-CM | POA: Diagnosis not present

## 2022-10-18 DIAGNOSIS — Z7952 Long term (current) use of systemic steroids: Secondary | ICD-10-CM | POA: Diagnosis not present

## 2022-10-18 DIAGNOSIS — E872 Acidosis, unspecified: Secondary | ICD-10-CM | POA: Diagnosis present

## 2022-10-18 DIAGNOSIS — G8929 Other chronic pain: Secondary | ICD-10-CM | POA: Diagnosis present

## 2022-10-18 DIAGNOSIS — Z8249 Family history of ischemic heart disease and other diseases of the circulatory system: Secondary | ICD-10-CM | POA: Diagnosis not present

## 2022-10-18 DIAGNOSIS — D62 Acute posthemorrhagic anemia: Secondary | ICD-10-CM | POA: Diagnosis present

## 2022-10-18 LAB — CBC WITH DIFFERENTIAL/PLATELET
Abs Immature Granulocytes: 0.01 10*3/uL (ref 0.00–0.07)
Basophils Absolute: 0 10*3/uL (ref 0.0–0.1)
Basophils Relative: 0 %
Eosinophils Absolute: 0.1 10*3/uL (ref 0.0–0.5)
Eosinophils Relative: 1 %
HCT: 21.7 % — ABNORMAL LOW (ref 39.0–52.0)
Hemoglobin: 6.3 g/dL — CL (ref 13.0–17.0)
Immature Granulocytes: 0 %
Lymphocytes Relative: 27 %
Lymphs Abs: 1 10*3/uL (ref 0.7–4.0)
MCH: 20.7 pg — ABNORMAL LOW (ref 26.0–34.0)
MCHC: 29 g/dL — ABNORMAL LOW (ref 30.0–36.0)
MCV: 71.1 fL — ABNORMAL LOW (ref 80.0–100.0)
Monocytes Absolute: 0.5 10*3/uL (ref 0.1–1.0)
Monocytes Relative: 13 %
Neutro Abs: 2.3 10*3/uL (ref 1.7–7.7)
Neutrophils Relative %: 59 %
Platelets: 251 10*3/uL (ref 150–400)
RBC: 3.05 MIL/uL — ABNORMAL LOW (ref 4.22–5.81)
RDW: 23.1 % — ABNORMAL HIGH (ref 11.5–15.5)
WBC: 3.9 10*3/uL — ABNORMAL LOW (ref 4.0–10.5)
nRBC: 1.3 % — ABNORMAL HIGH (ref 0.0–0.2)

## 2022-10-18 LAB — COMPREHENSIVE METABOLIC PANEL
ALT: 20 U/L (ref 0–44)
AST: 28 U/L (ref 15–41)
Albumin: 3.1 g/dL — ABNORMAL LOW (ref 3.5–5.0)
Alkaline Phosphatase: 72 U/L (ref 38–126)
Anion gap: 9 (ref 5–15)
BUN: 18 mg/dL (ref 8–23)
CO2: 20 mmol/L — ABNORMAL LOW (ref 22–32)
Calcium: 8.6 mg/dL — ABNORMAL LOW (ref 8.9–10.3)
Chloride: 103 mmol/L (ref 98–111)
Creatinine, Ser: 1.43 mg/dL — ABNORMAL HIGH (ref 0.61–1.24)
GFR, Estimated: 48 mL/min — ABNORMAL LOW (ref >60)
Glucose, Bld: 107 mg/dL — ABNORMAL HIGH (ref 70–99)
Potassium: 3.9 mmol/L (ref 3.5–5.1)
Sodium: 132 mmol/L — ABNORMAL LOW (ref 135–145)
Total Bilirubin: 0.3 mg/dL (ref 0.3–1.2)
Total Protein: 6.8 g/dL (ref 6.5–8.1)

## 2022-10-18 LAB — TYPE AND SCREEN
Unit division: 0
Unit division: 0

## 2022-10-18 LAB — PROTIME-INR
INR: 1.2 (ref 0.8–1.2)
Prothrombin Time: 15.1 seconds (ref 11.4–15.2)

## 2022-10-18 LAB — CBC
HCT: 17.8 % — ABNORMAL LOW (ref 39.0–52.0)
HCT: 21 % — ABNORMAL LOW (ref 39.0–52.0)
Hemoglobin: 5.1 g/dL — CL (ref 13.0–17.0)
Hemoglobin: 6.5 g/dL — CL (ref 13.0–17.0)
MCH: 19 pg — ABNORMAL LOW (ref 26.0–34.0)
MCH: 21.2 pg — ABNORMAL LOW (ref 26.0–34.0)
MCHC: 28.7 g/dL — ABNORMAL LOW (ref 30.0–36.0)
MCHC: 31 g/dL (ref 30.0–36.0)
MCV: 66.2 fL — ABNORMAL LOW (ref 80.0–100.0)
MCV: 68.4 fL — ABNORMAL LOW (ref 80.0–100.0)
Platelets: 245 10*3/uL (ref 150–400)
Platelets: 224 10*3/uL (ref 150–400)
RBC: 2.69 MIL/uL — ABNORMAL LOW (ref 4.22–5.81)
RBC: 3.07 MIL/uL — ABNORMAL LOW (ref 4.22–5.81)
RDW: 21.3 % — ABNORMAL HIGH (ref 11.5–15.5)
RDW: 22.3 % — ABNORMAL HIGH (ref 11.5–15.5)
WBC: 4.4 10*3/uL (ref 4.0–10.5)
WBC: 3.7 10*3/uL — ABNORMAL LOW (ref 4.0–10.5)
nRBC: 1.8 % — ABNORMAL HIGH (ref 0.0–0.2)
nRBC: 2.2 % — ABNORMAL HIGH (ref 0.0–0.2)

## 2022-10-18 LAB — BPAM RBC
Blood Product Expiration Date: 202404232359
Blood Product Expiration Date: 202405042359
ISSUE DATE / TIME: 202404170433
ISSUE DATE / TIME: 202404172306
Unit Type and Rh: 5100
Unit Type and Rh: 5100

## 2022-10-18 LAB — URINALYSIS, ROUTINE W REFLEX MICROSCOPIC
Bilirubin Urine: NEGATIVE
Glucose, UA: NEGATIVE mg/dL
Hgb urine dipstick: NEGATIVE
Ketones, ur: NEGATIVE mg/dL
Leukocytes,Ua: NEGATIVE
Nitrite: NEGATIVE
Protein, ur: NEGATIVE mg/dL
Specific Gravity, Urine: 1.02 (ref 1.005–1.030)
pH: 5 (ref 5.0–8.0)

## 2022-10-18 LAB — LACTIC ACID, PLASMA
Lactic Acid, Venous: 2.8 mmol/L (ref 0.5–1.9)
Lactic Acid, Venous: 1.8 mmol/L (ref 0.5–1.9)

## 2022-10-18 LAB — PREPARE RBC (CROSSMATCH)

## 2022-10-18 LAB — IRON AND TIBC
Iron: <5 ug/dL — ABNORMAL LOW (ref 45–182)
TIBC: 437 ug/dL (ref 250–450)

## 2022-10-18 LAB — POC OCCULT BLOOD, ED: Fecal Occult Bld: POSITIVE — AB

## 2022-10-18 LAB — CBG MONITORING, ED
Glucose-Capillary: 86 mg/dL (ref 70–99)
Glucose-Capillary: 104 mg/dL — ABNORMAL HIGH (ref 70–99)

## 2022-10-18 LAB — FERRITIN: Ferritin: 6 ng/mL — ABNORMAL LOW (ref 24–336)

## 2022-10-18 MED ORDER — TRAZODONE HCL 50 MG PO TABS
50.0000 mg | ORAL_TABLET | Freq: Every day | ORAL | Status: DC
  Administered 2022-10-18 – 2022-10-23 (×6): 50 mg via ORAL
  Filled 2022-10-18 (×6): qty 1

## 2022-10-18 MED ORDER — ONDANSETRON HCL 4 MG/2ML IJ SOLN: 4.0000 mg | Freq: Four times a day (QID) | INTRAMUSCULAR | Status: DC | PRN

## 2022-10-18 MED ORDER — ONDANSETRON HCL 4 MG PO TABS: 4.0000 mg | ORAL_TABLET | Freq: Four times a day (QID) | ORAL | Status: DC | PRN

## 2022-10-18 MED ORDER — BACLOFEN 10 MG PO TABS
10.0000 mg | ORAL_TABLET | Freq: Two times a day (BID) | ORAL | Status: DC
  Administered 2022-10-19 – 2022-10-24 (×11): 10 mg via ORAL
  Filled 2022-10-18 (×11): qty 1

## 2022-10-18 MED ORDER — SODIUM CHLORIDE 0.9% IV SOLUTION: Freq: Once | INTRAVENOUS | Status: AC

## 2022-10-18 MED ORDER — ATORVASTATIN CALCIUM 40 MG PO TABS
40.0000 mg | ORAL_TABLET | Freq: Every day | ORAL | Status: DC
  Administered 2022-10-18 – 2022-10-23 (×6): 40 mg via ORAL
  Filled 2022-10-18 (×6): qty 1

## 2022-10-18 MED ORDER — DULOXETINE HCL 30 MG PO CPEP
30.0000 mg | ORAL_CAPSULE | Freq: Two times a day (BID) | ORAL | Status: DC
  Administered 2022-10-18 – 2022-10-24 (×12): 30 mg via ORAL
  Filled 2022-10-18 (×12): qty 1

## 2022-10-18 MED ORDER — METOPROLOL SUCCINATE ER 50 MG PO TB24
50.0000 mg | ORAL_TABLET | Freq: Every day | ORAL | Status: DC
  Administered 2022-10-18 – 2022-10-24 (×6): 50 mg via ORAL
  Filled 2022-10-18 (×7): qty 1

## 2022-10-18 MED ORDER — ACETAMINOPHEN 325 MG PO TABS
650.0000 mg | ORAL_TABLET | Freq: Four times a day (QID) | ORAL | Status: DC | PRN
  Administered 2022-10-20 – 2022-10-23 (×3): 650 mg via ORAL
  Filled 2022-10-18 (×4): qty 2

## 2022-10-18 MED ORDER — PANTOPRAZOLE SODIUM 40 MG IV SOLR
40.0000 mg | Freq: Two times a day (BID) | INTRAVENOUS | Status: DC
  Administered 2022-10-18 – 2022-10-24 (×13): 40 mg via INTRAVENOUS
  Filled 2022-10-18 (×13): qty 10

## 2022-10-18 MED ORDER — ALBUTEROL SULFATE (2.5 MG/3ML) 0.083% IN NEBU: 3.0000 mL | INHALATION_SOLUTION | RESPIRATORY_TRACT | Status: DC | PRN

## 2022-10-18 MED ORDER — LORAZEPAM 2 MG/ML IJ SOLN: 0.5000 mg | Freq: Once | INTRAMUSCULAR | Status: DC

## 2022-10-18 MED ORDER — SODIUM CHLORIDE 0.9 % IV SOLN: INTRAVENOUS | Status: DC

## 2022-10-18 MED ORDER — GABAPENTIN 300 MG PO CAPS
300.0000 mg | ORAL_CAPSULE | Freq: Three times a day (TID) | ORAL | Status: DC
  Administered 2022-10-20 – 2022-10-24 (×12): 300 mg via ORAL
  Filled 2022-10-18 (×12): qty 1

## 2022-10-18 MED ORDER — SODIUM CHLORIDE 0.9% FLUSH
3.0000 mL | Freq: Two times a day (BID) | INTRAVENOUS | Status: DC
  Administered 2022-10-18 – 2022-10-24 (×12): 3 mL via INTRAVENOUS

## 2022-10-18 MED ORDER — SODIUM CHLORIDE 0.9 % IV SOLN: INTRAVENOUS | Status: AC

## 2022-10-18 MED ORDER — ACETAMINOPHEN 650 MG RE SUPP: 650.0000 mg | Freq: Four times a day (QID) | RECTAL | Status: DC | PRN

## 2022-10-18 MED ORDER — OXYCODONE HCL 5 MG PO TABS
5.0000 mg | ORAL_TABLET | Freq: Once | ORAL | Status: AC
  Administered 2022-10-18: 5 mg via ORAL
  Filled 2022-10-18: qty 1
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
## 2022-10-18 NOTE — Plan of Care (Signed)

## 2022-10-18 NOTE — Consult Note (Signed)
Consultation Note   Referring Provider:  [facility] PCP: Porfirio Oar, PA Primary Gastroenterologist: [name]        Reason for consultation: GI bleed with melena  Hospital Day: 2   Assessment   88 year old male presenting to the ED with a fall and confusion.  Found to have severe microcytic anemia.    Severe microcytic anemia / FOBT+ on Plavix, ASA and Xarelto.   Hemoglobin 5.1 (baseline unknown but was 14.2 in August 2022).  He reports dark stool for unknown length of time.  Unclear if on iron at home as it is listed on home med list but family says not taking. Received 2 u PRBCs  History of IDA. Limited details available. Previously followed by Hematology in 2021.  Unable to find any information in Care Everywhere other than Hematology notes from 2021.  Any previous GI workup of IDA is unknown  GERD Currently not on treatment  Atrial fibrillation, on Xarelto  CAD and reported history of NSTEMI in Sept 2023.  On Plavix   Plan   -Awaiting post transfusion Hbg -TIBC is pending, will obtain a ferritin as well -Continue twice daily PPI -He will need an EGD,  ideally a colonoscopy as well after plavix /Xarelto washout.  Given patient's recent confusion I discussed the procedures with his granddaughter. The risks and benefits of EGD and colonoscopy with possible polypectomy / biopsies were discussed and the Granddaughter agrees to proceed.   History of Present Illness   Patient is a 88 y.o. year old male with a past medical history of atrial fibrillation, CAD /NSTEMI in September 2023, CVA, hypertension, history of IDA previously followed by hematology. See PMH for any additional medical problems.  ED WORKUP:   Patient presented to the ED yesterday after a fall.    Granddaughter was in the room and daughter is on speaker phone.  Patient has had some confusion over the last couple days and speech has been slurred.  He  describes weakness and dizziness over the last week. He has been having black stools for unclear length of time ( first said 1 week but then tells me it has been going on for longer than that).  He also told me that he takes iron but family says he does not take any iron supplements.  No NSAID use other than a daily baby ASA. Per family he has chronic intermittent nausea and vomiting.. In the ED his hemoglobin was 5.1, FOBT positive , MCV 66, creatinine 1.4, lactic acid 2.8. INR 1.2, BUN 18, Cr 1.43.   No acute findings on head CT scan.  Chest x-ray shows cardiomegaly with suspected mild interstitial edema.   GI History:  History comes from the chart and also family as patient has had altered mental status over the last couple of days. Per granddaughter patient had an NSTEMI in September 2023.  He was hospitalized at [facility].  He was found to be anemic and received a unit of blood.  No GI workup was performed.  Per patient and family there is no history of EGD or colonoscopy at any time.  However, hematology note in care everywhere dated 03/01/2020 mentions that patient previously did have a GI workup.    Imaging and  Labs   Recent Labs    10/18/22 0119  WBC 4.4  HGB 5.1*  HCT 17.8*  PLT 245   Recent Labs    10/18/22 0119  NA 132*  K 3.9  CL 103  CO2 20*  GLUCOSE 107*  BUN 18  CREATININE 1.43*  CALCIUM 8.6*   Recent Labs    10/18/22 0119  PROT 6.8  ALBUMIN 3.1*  AST 28  ALT 20  ALKPHOS 72  BILITOT 0.3   No results for input(s): "HEPBSAG", "HCVAB", "HEPAIGM", "HEPBIGM" in the last 72 hours. Recent Labs    10/18/22 0119  LABPROT 15.1  INR 1.2    Past Medical History:  Diagnosis Date   Atrial fibrillation    Coronary artery disease    Hypertension    NSTEMI (non-ST elevated myocardial infarction)    Stroke     Past Surgical History:  Procedure Laterality Date   CARDIAC SURGERY     ROTATOR CUFF REPAIR      Family History  Problem Relation Age of Onset    Diabetes Mother    Heart attack Father 101   Hypertension Brother    Heart disease Brother    Diabetes Brother     Prior to Admission medications   Medication Sig Start Date End Date Taking? Authorizing Provider  acetaminophen (TYLENOL) 500 MG tablet Take 1,000 mg by mouth every 6 (six) hours as needed for mild pain.   Yes [provider]  albuterol (VENTOLIN HFA) 108 (90 Base) MCG/ACT inhaler Inhale 2 puffs into the lungs every 4 (four) hours as needed for wheezing or shortness of breath. 02/24/20  Yes [provider]  atorvastatin (LIPITOR) 40 MG tablet Take 40 mg by mouth at bedtime. 08/24/22  Yes [provider]  baclofen (LIORESAL) 10 MG tablet Take 10 mg by mouth 3 (three) times daily.   Yes [provider]  clopidogrel (PLAVIX) 75 MG tablet Take 75 mg by mouth daily. 08/24/22  Yes [provider]  DULoxetine (CYMBALTA) 30 MG capsule Take 30 mg by mouth 2 (two) times daily.   Yes [provider]  Ferrous Sulfate (IRON PO) Take 1 tablet by mouth daily.   Yes [provider]  gabapentin (NEURONTIN) 300 MG capsule Take 300 mg by mouth 3 (three) times daily.   Yes [provider]  isosorbide mononitrate (IMDUR) 30 MG 24 hr tablet Take 30 mg by mouth daily. 08/24/22  Yes [provider]  losartan (COZAAR) 25 MG tablet Take 25 mg by mouth daily. 08/24/22  Yes [provider]  metoprolol succinate (TOPROL-XL) 50 MG 24 hr tablet Take 50 mg by mouth daily. 08/24/22  Yes [provider]  traZODone (DESYREL) 50 MG tablet Take 50 mg by mouth at bedtime.   Yes [provider]  XARELTO 20 MG TABS tablet Take 20 mg by mouth every morning. 08/24/22  Yes [provider]    Current Facility-Administered Medications  Medication Dose Route Frequency Provider Last Rate Last Admin   0.9 %  sodium chloride infusion   Intravenous Continuous Marinda Elk, MD 75 mL/hr at 10/18/22 0821 New Bag at  10/18/22 0821   acetaminophen (TYLENOL) tablet 650 mg  650 mg Oral Q6H PRN Russella Dar, NP       Or   acetaminophen (TYLENOL) suppository 650 mg  650 mg Rectal Q6H PRN Russella Dar, NP       ondansetron [facility]) tablet 4 mg  4 mg Oral Q6H  PRN Russella Dar, NP       Or   ondansetron [facility]) injection 4 mg  4 mg Intravenous Q6H PRN Russella Dar, NP       pantoprazole (PROTONIX) injection 40 mg  40 mg Intravenous Q12H Russella Dar, NP       sodium chloride flush (NS) 0.9 % injection 3 mL  3 mL Intravenous Q12H Russella Dar, NP       Current Outpatient Medications  Medication Sig Dispense Refill   acetaminophen (TYLENOL) 500 MG tablet Take 1,000 mg by mouth every 6 (six) hours as needed for mild pain.     albuterol (VENTOLIN HFA) 108 (90 Base) MCG/ACT inhaler Inhale 2 puffs into the lungs every 4 (four) hours as needed for wheezing or shortness of breath.     atorvastatin (LIPITOR) 40 MG tablet Take 40 mg by mouth at bedtime.     baclofen (LIORESAL) 10 MG tablet Take 10 mg by mouth 3 (three) times daily.     clopidogrel (PLAVIX) 75 MG tablet Take 75 mg by mouth daily.     DULoxetine (CYMBALTA) 30 MG capsule Take 30 mg by mouth 2 (two) times daily.     Ferrous Sulfate (IRON PO) Take 1 tablet by mouth daily.     gabapentin (NEURONTIN) 300 MG capsule Take 300 mg by mouth 3 (three) times daily.     isosorbide mononitrate (IMDUR) 30 MG 24 hr tablet Take 30 mg by mouth daily.     losartan (COZAAR) 25 MG tablet Take 25 mg by mouth daily.     metoprolol succinate (TOPROL-XL) 50 MG 24 hr tablet Take 50 mg by mouth daily.     traZODone (DESYREL) 50 MG tablet Take 50 mg by mouth at bedtime.     XARELTO 20 MG TABS tablet Take 20 mg by mouth every morning.      Allergies as of 10/17/2022 - Review Complete 10/17/2022  Allergen Reaction Noted   Iodine Hives, Itching, and Rash 02/24/2011   Ferrous ammonium sulfate Hives 05/17/2020   Shellfish allergy Hives 05/17/2020    Social  History   Socioeconomic History   Marital status: Married    Spouse name: Not on file   Number of children: Not on file   Years of education: Not on file   Highest education level: Not on file  Occupational History   Not on file  Tobacco Use   Smoking status: Former   Smokeless tobacco: Never  Vaping Use   Vaping Use: Never used  Substance and Sexual Activity   Alcohol use: No   Drug use: Never   Sexual activity: Not Currently  Other Topics Concern   Not on file  Social History Narrative   Not on file   Social Determinants of Health   Financial Resource Strain: Not on file  Food Insecurity: Not on file  Transportation Needs: Not on file  Physical Activity: Not on file  Stress: Not on file  Social Connections: Not on file  Intimate Partner Violence: Not on file    Review of Systems: All systems reviewed and negative except where noted in HPI.  Physical Exam: Vital signs in last 24 hours: Temp:  [97.4 F (36.3 C)-98.7 F (37.1 C)] 97.4 F (36.3 C) (04/17 0924) Pulse Rate:  [34-88] 77 (04/17 0924) Resp:  [10-28] 18 (04/17 0924) BP: (88-128)/(44-107) 119/78 (04/17 0924) SpO2:  [88 %-100 %] 100 % (04/17 1610)    General:  Alert male in NAD  Psych:  Pleasant, cooperative. Normal mood and affect Eyes: Pupils equal Ears:  Hard of hearing.  Nose: No deformity, discharge or lesions Neck:  Supple, no masses felt Lungs:  Clear to auscultation.  Heart:  Regular rate, regular rhythm.  Abdomen:  Soft, nondistended, nontender, active bowel sounds, no masses felt Rectal :  Deferred Msk: Symmetrical without gross deformities.  Neurologic:  Alert, oriented, grossly normal neurologically Skin:  Intact without significant lesions.    Intake/Output from previous day: 04/16 0701 - 04/17 0700 In: 315 [Blood:315] Out: -  Intake/Output this shift:  Total I/O In: 308 [Blood:308] Out: -     Principal Problem:   Symptomatic anemia    Willette Cluster, NP-C @   10/18/2022, 10:13 AM

## 2022-10-18 NOTE — Progress Notes (Signed)
New patient from the ed with left lower leg pain. Patient is alert and oriented x4. Patient is hoh.MP shows atrial flutter. Patient is on room air. Patient oriented to bed room and remote. Patient has PIV with NS infusing.
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
## 2022-10-18 NOTE — ED Notes (Signed)
ED TO INPATIENT HANDOFF REPORT  ED Nurse Name and Phone #:  Theophilus Bones [phone]  S Name/Age/Gender [name] 88 y.o. male Room/Bed: 029C/029C  Code Status   Code Status: Full Code  Home/SNF/Other Home Patient oriented to: self and place Is this baseline?  no  Triage Complete: Triage complete  Chief Complaint Symptomatic anemia [D64.9]  Triage Note Patient BIB [facility] from home with complaint of chronic left leg pain. Patient reports pain worse today. Denies injury.  Per EMS family reported to them that patient fell last Friday 10/13/22 (GLF in bathroom).   +thinners      Allergies Allergies  Allergen Reactions   Iodine Hives, Itching and Rash         Ferrous Ammonium Sulfate Hives   Shellfish Allergy Hives    Level of Care/Admitting Diagnosis ED Disposition     ED Disposition  Admit   Condition  --   Comment  Hospital Area: [facility] [[id number]]  Level of Care: Progressive [102]  Admit to Progressive based on following criteria: GI, ENDOCRINE disease patients with GI bleeding, acute liver failure or pancreatitis, stable with diabetic ketoacidosis or thyrotoxicosis (hypothyroid) state.  May admit patient to [facility] or [facility] if equivalent level of care is available:: Yes  Covid Evaluation: Confirmed COVID Negative  Diagnosis: Symptomatic anemia [1478295]  Admitting Physician: Marinda Elk [[id number]]  Attending Physician: Russella Dar [[id number]]  Certification:: I certify this patient will need inpatient services for at least 2 midnights  Estimated Length of Stay: 3          B Medical/Surgery History Past Medical History:  Diagnosis Date   Atrial fibrillation    Coronary artery disease    Hypertension    NSTEMI (non-ST elevated myocardial infarction)    Stroke    Past Surgical History:  Procedure Laterality Date   CARDIAC SURGERY     ROTATOR CUFF REPAIR       A IV Location/Drains/Wounds Patient  Lines/Drains/Airways Status     Active Line/Drains/Airways     Name Placement date Placement time Site Days   Peripheral IV 10/18/22 20 G Posterior;Right Wrist 10/18/22  0437  Wrist  less than 1            Intake/Output Last 24 hours  Intake/Output Summary (Last 24 hours) at 10/18/2022 0851 Last data filed at 10/18/2022 6213 Gross per 24 hour  Intake 315 ml  Output --  Net 315 ml    Labs/Imaging Results for orders placed or performed during the hospital encounter of 10/17/22 (from the past 48 hour(s))  CBG monitoring, ED     Status: Abnormal   Collection Time: 10/17/22 11:42 PM  Result Value Ref Range   Glucose-Capillary 111 (H) 70 - 99 mg/dL    Comment: Glucose reference range applies only to samples taken after fasting for at least 8 hours.  Comprehensive metabolic panel     Status: Abnormal   Collection Time: 10/18/22  1:19 AM  Result Value Ref Range   Sodium 132 (L) 135 - 145 mmol/L   Potassium 3.9 3.5 - 5.1 mmol/L   Chloride 103 98 - 111 mmol/L   CO2 20 (L) 22 - 32 mmol/L   Glucose, Bld 107 (H) 70 - 99 mg/dL    Comment: Glucose reference range applies only to samples taken after fasting for at least 8 hours.   BUN 18 8 - 23 mg/dL   Creatinine, Ser 0.86 (H) 0.61 - 1.24 mg/dL  Calcium 8.6 (L) 8.9 - 10.3 mg/dL   Total Protein 6.8 6.5 - 8.1 g/dL   Albumin 3.1 (L) 3.5 - 5.0 g/dL   AST 28 15 - 41 U/L   ALT 20 0 - 44 U/L   Alkaline Phosphatase 72 38 - 126 U/L   Total Bilirubin 0.3 0.3 - 1.2 mg/dL   GFR, Estimated 48 (L) >60 mL/min    Comment: (NOTE) Calculated using the CKD-EPI Creatinine Equation (2021)    Anion gap 9 5 - 15    Comment: Performed at [facility], [address], [place], [place] [address]  CBC     Status: Abnormal   Collection Time: 10/18/22  1:19 AM  Result Value Ref Range   WBC 4.4 4.0 - 10.5 K/uL   RBC 2.69 (L) 4.22 - 5.81 MIL/uL   Hemoglobin 5.1 (LL) 13.0 - 17.0 g/dL    Comment: REPEATED TO VERIFY Reticulocyte Hemoglobin  testing may be clinically indicated, consider ordering this additional test [id number] THIS CRITICAL RESULT HAS VERIFIED AND BEEN CALLED TO Henriette Combs, RN BY ENIOLA ADEDOKUN ON 04 17 2024 AT 0153, AND HAS BEEN READ BACK.     HCT 17.8 (L) 39.0 - 52.0 %   MCV 66.2 (L) 80.0 - 100.0 fL   MCH 19.0 (L) 26.0 - 34.0 pg   MCHC 28.7 (L) 30.0 - 36.0 g/dL   RDW 11.9 (H) 14.7 - 82.9 %   Platelets 245 150 - 400 K/uL   nRBC 1.8 (H) 0.0 - 0.2 %    Comment: Performed at [facility], [address], [place], [place] [address]  Protime-INR - (order if patient is taking Coumadin / Warfarin)     Status: None   Collection Time: 10/18/22  1:19 AM  Result Value Ref Range   Prothrombin Time 15.1 11.4 - 15.2 seconds   INR 1.2 0.8 - 1.2    Comment: (NOTE) INR goal varies based on device and disease states. Performed at [facility], [address], [place], [place] [address]   Lactic acid, plasma     Status: Abnormal   Collection Time: 10/18/22  1:19 AM  Result Value Ref Range   Lactic Acid, Venous 2.8 (HH) 0.5 - 1.9 mmol/L    Comment: CRITICAL RESULT CALLED TO, READ BACK BY AND VERIFIED WITH S. JOHNSON RN 10/18/22 @0230  BY J. WHITE Performed at [facility], [address], [place], [place] [address]   CBG monitoring, ED     Status: None   Collection Time: 10/18/22  2:18 AM  Result Value Ref Range   Glucose-Capillary 86 70 - 99 mg/dL    Comment: Glucose reference range applies only to samples taken after fasting for at least 8 hours.  Type and screen [facility]     Status: None (Preliminary result)   Collection Time: 10/18/22  2:46 AM  Result Value Ref Range   ABO/RH(D) O POS    Antibody Screen NEG    Sample Expiration 10/21/2022,2359    Unit Number [id number]    Blood Component Type RBC LR PHER2    Unit division 00    Status of Unit ISSUED    Transfusion Status OK TO TRANSFUSE    Crossmatch Result      Compatible Performed at [facility], [address], [place], [place] [address]    Unit Number [id number]    Blood Component Type RED CELLS,LR    Unit division  00    Status of Unit ISSUED    Transfusion Status OK TO TRANSFUSE    Crossmatch Result Compatible   Lactic acid, plasma     Status: None   Collection Time: 10/18/22  2:48 AM  Result Value Ref Range   Lactic Acid, Venous 1.8 0.5 - 1.9 mmol/L    Comment: Performed at [facility], [address] [place], [place] [address]  Prepare RBC (crossmatch)     Status: None   Collection Time: 10/18/22  3:30 AM  Result Value Ref Range   Order Confirmation      ORDER PROCESSED BY BLOOD BANK Performed at [facility], [address], [place], [place] [address]   POC occult blood, ED     Status: Abnormal   Collection Time: 10/18/22  3:49 AM  Result Value Ref Range   Fecal Occult Bld POSITIVE (A) NEGATIVE   DG Chest Portable 1 View  Result Date: 10/18/2022 CLINICAL DATA:  Cough EXAM: PORTABLE CHEST 1 VIEW COMPARISON:  02/16/2021 FINDINGS: Increased perihilar markings, favoring mild interstitial edema. No definite pleural effusions. No pneumothorax. Cardiomegaly. IMPRESSION: Cardiomegaly with suspected mild interstitial edema. No definite pleural effusions. Electronically Signed   By: Charline Bills M.D.   On: 10/18/2022 01:07   CT Head Wo Contrast  Result Date: 10/18/2022 CLINICAL DATA:  Altered mental status. Fall last Friday 10/13/2022. On blood thinners. EXAM: CT HEAD WITHOUT CONTRAST TECHNIQUE: Contiguous axial images were obtained from the base of the skull through the vertex without intravenous contrast. RADIATION DOSE REDUCTION: This exam was performed according to the departmental dose-optimization program which includes automated exposure control, adjustment of the mA and/or kV according to patient size and/or use of iterative reconstruction technique. COMPARISON:  CT head 08/06/2020 FINDINGS: Brain: Exam is mildly compromised by streak artifact from metallic  densities within the scalp. No intracranial hemorrhage, mass effect, or evidence of acute infarct. No hydrocephalus. No extra-axial fluid collection. Generalized cerebral atrophy. Ill-defined hypoattenuation within the cerebral white matter is nonspecific but consistent with chronic small vessel ischemic disease. Vascular: No hyperdense vessel. Intracranial arterial calcification. Skull: No fracture or focal lesion. Sinuses/Orbits: No acute finding. Other: None. IMPRESSION: 1. No evidence of acute intracranial abnormality. 2. Generalized cerebral atrophy and chronic small vessel ischemic disease. Electronically Signed   By: Minerva Fester M.D.   On: 10/18/2022 01:07    Pending Labs Unresulted Labs (From admission, onward)     Start     Ordered   10/19/22 0500  Comprehensive metabolic panel  Tomorrow morning,   R        10/18/22 0708   10/19/22 0500  CBC  Tomorrow morning,   R        10/18/22 0708   10/18/22 0506  Iron and TIBC  Add-on,   AD        10/18/22 0505   10/18/22 0038  Blood culture (routine x 2)  BLOOD CULTURE X 2,   R (with STAT occurrences)     Question:  Release to patient  Answer:  Immediate   10/18/22 0037   10/18/22 0035  Urinalysis, Routine w reflex microscopic -Urine, Clean Catch  Once,   URGENT       Question Answer Comment  Specimen Source Urine, Clean Catch   Release to patient Immediate      10/18/22 0034            Vitals/Pain Today's Vitals   10/18/22 0715 10/18/22 0730 10/18/22 0745 10/18/22  0800  BP: (!) 97/58 (!) 120/58 (!) 127/107 (!) 91/55  Pulse: (!) 49 (!) 58 (!) 59 (!) 57  Resp: (!) 22 (!) 28 (!) 21 10  Temp:      TempSrc:      SpO2: 95% 100% (!) 89% 95%    Isolation Precautions No active isolations  Medications Medications  sodium chloride flush (NS) 0.9 % injection 3 mL (has no administration in time range)  acetaminophen (TYLENOL) tablet 650 mg (has no administration in time range)    Or  acetaminophen (TYLENOL) suppository 650 mg  (has no administration in time range)  ondansetron (ZOFRAN) tablet 4 mg (has no administration in time range)    Or  ondansetron (ZOFRAN) injection 4 mg (has no administration in time range)  0.9 %  sodium chloride infusion ( Intravenous New Bag/Given 10/18/22 0821)  0.9 %  sodium chloride infusion (Manually program via Guardrails IV Fluids) ( Intravenous New Bag/Given 10/18/22 0437)    Mobility walks with device     Focused Assessments Neuro Assessment Handoff:  Swallow screen pass? Yes    NIH Stroke Scale  Dizziness Present: No Headache Present: No Level of Consciousness (1a.)   : Alert, keenly responsive LOC Questions (1b. )   : Answers one question correctly LOC Commands (1c. )   : Performs both tasks correctly Best Gaze (2. )  : Normal Visual (3. )  : No visual loss Facial Palsy (4. )    : Normal symmetrical movements Motor Arm, Left (5a. )   : No drift Motor Arm, Right (5b. ) : No drift Motor Leg, Left (6a. )  : Some effort against gravity Motor Leg, Right (6b. ) : No drift Limb Ataxia (7. ): Present in one limb Sensory (8. )  : Mild-to-moderate sensory loss, patient feels pinprick is less sharp or is dull on the affected side, or there is a loss of superficial pain with pinprick, but patient is aware of being touched Best Language (9. )  : No aphasia Dysarthria (10. ): Normal Extinction/Inattention (11.)   : No Abnormality Complete NIHSS TOTAL: 5     Neuro Assessment: Exceptions to WDL Neuro Checks:      Has TPA been given? No If patient is a Neuro Trauma and patient is going to OR before floor call report to 4N Charge nurse: [phone] or [phone]   R Recommendations: See Admitting Provider Note  Report given to:   Additional Notes:

## 2022-10-18 NOTE — Progress Notes (Signed)
Notified Dr. Margo Aye of Hgb 6.5 she was aware of the  Hgb 6.3 from earlier

## 2022-10-18 NOTE — ED Notes (Signed)
Date and time results received: 10/18/22 0201 (use smartphrase ".now" to insert current time)  Test: Hgb Critical Value: 5.1  Name of Provider Notified: Earna Coder MD  Orders Received? Or Actions Taken?:  MD to placed orders

## 2022-10-18 NOTE — ED Notes (Signed)
Please call with up date about Pt [phone]

## 2022-10-18 NOTE — Progress Notes (Signed)
Pt c/o LL leg pain day shift nurse text Kandy Garrison NP for pain medication for the pt and got no response , I have text Delma Post MD tonight to see if they have this pt to get him something for his pain @ 1945 no response as of this time.

## 2022-10-18 NOTE — ED Notes (Signed)
ED TO INPATIENT HANDOFF REPORT  ED Nurse Name and Phone #: Delice Bison, RN  S Name/Age/Gender Tristan Gardner 88 y.o. male Room/Bed: 037C/037C  Code Status   Code Status: Full Code  Home/SNF/Other Home Patient oriented to: self, place, and situation Is this baseline? No   Triage Complete: Triage complete  Chief Complaint Symptomatic anemia [D64.9]  Triage Note Patient BIB [facility] from home with complaint of chronic left leg pain. Patient reports pain worse today. Denies injury.  Per EMS family reported to them that patient fell last Friday 10/13/22 (GLF in bathroom).   +thinners      Allergies Allergies  Allergen Reactions   Iodine Hives, Itching and Rash         Ferrous Ammonium Sulfate Hives   Shellfish Allergy Hives    Level of Care/Admitting Diagnosis ED Disposition     ED Disposition  Admit   Condition  --   Comment  Hospital Area: [facility] [[id number]]  Level of Care: Progressive [102]  Admit to Progressive based on following criteria: GI, ENDOCRINE disease patients with GI bleeding, acute liver failure or pancreatitis, stable with diabetic ketoacidosis or thyrotoxicosis (hypothyroid) state.  May admit patient to [facility] or [facility] if equivalent level of care is available:: Yes  Covid Evaluation: Confirmed COVID Negative  Diagnosis: Symptomatic anemia [1610960]  Admitting Physician: Marinda Elk [[id number]]  Attending Physician: Russella Dar [[id number]]  Certification:: I certify this patient will need inpatient services for at least 2 midnights  Estimated Length of Stay: 3          B Medical/Surgery History Past Medical History:  Diagnosis Date   Atrial fibrillation    Coronary artery disease    Hypertension    NSTEMI (non-ST elevated myocardial infarction)    Stroke    Past Surgical History:  Procedure Laterality Date   CARDIAC SURGERY     ROTATOR CUFF REPAIR       A IV Location/Drains/Wounds Patient  Lines/Drains/Airways Status     Active Line/Drains/Airways     Name Placement date Placement time Site Days   Peripheral IV 10/18/22 20 G Posterior;Right Wrist 10/18/22  0437  Wrist  less than 1            Intake/Output Last 24 hours  Intake/Output Summary (Last 24 hours) at 10/18/2022 1527 Last data filed at 10/18/2022 4540 Gross per 24 hour  Intake 623 ml  Output --  Net 623 ml    Labs/Imaging Results for orders placed or performed during the hospital encounter of 10/17/22 (from the past 48 hour(s))  CBG monitoring, ED     Status: Abnormal   Collection Time: 10/17/22 11:42 PM  Result Value Ref Range   Glucose-Capillary 111 (H) 70 - 99 mg/dL    Comment: Glucose reference range applies only to samples taken after fasting for at least 8 hours.  Comprehensive metabolic panel     Status: Abnormal   Collection Time: 10/18/22  1:19 AM  Result Value Ref Range   Sodium 132 (L) 135 - 145 mmol/L   Potassium 3.9 3.5 - 5.1 mmol/L   Chloride 103 98 - 111 mmol/L   CO2 20 (L) 22 - 32 mmol/L   Glucose, Bld 107 (H) 70 - 99 mg/dL    Comment: Glucose reference range applies only to samples taken after fasting for at least 8 hours.   BUN 18 8 - 23 mg/dL   Creatinine, Ser 9.81 (H) 0.61 - 1.24 mg/dL  Calcium 8.6 (L) 8.9 - 10.3 mg/dL   Total Protein 6.8 6.5 - 8.1 g/dL   Albumin 3.1 (L) 3.5 - 5.0 g/dL   AST 28 15 - 41 U/L   ALT 20 0 - 44 U/L   Alkaline Phosphatase 72 38 - 126 U/L   Total Bilirubin 0.3 0.3 - 1.2 mg/dL   GFR, Estimated 48 (L) >60 mL/min    Comment: (NOTE) Calculated using the CKD-EPI Creatinine Equation (2021)    Anion gap 9 5 - 15    Comment: Performed at [facility], [address], [place], [place] [address]  CBC     Status: Abnormal   Collection Time: 10/18/22  1:19 AM  Result Value Ref Range   WBC 4.4 4.0 - 10.5 K/uL   RBC 2.69 (L) 4.22 - 5.81 MIL/uL   Hemoglobin 5.1 (LL) 13.0 - 17.0 g/dL    Comment: REPEATED TO VERIFY Reticulocyte Hemoglobin  testing may be clinically indicated, consider ordering this additional test [id number] THIS CRITICAL RESULT HAS VERIFIED AND BEEN CALLED TO Henriette Combs, RN BY ENIOLA ADEDOKUN ON 04 17 2024 AT 0153, AND HAS BEEN READ BACK.     HCT 17.8 (L) 39.0 - 52.0 %   MCV 66.2 (L) 80.0 - 100.0 fL   MCH 19.0 (L) 26.0 - 34.0 pg   MCHC 28.7 (L) 30.0 - 36.0 g/dL   RDW 28.4 (H) 13.2 - 44.0 %   Platelets 245 150 - 400 K/uL   nRBC 1.8 (H) 0.0 - 0.2 %    Comment: Performed at [facility], [address] [place], [place] [address]  Protime-INR - (order if patient is taking Coumadin / Warfarin)     Status: None   Collection Time: 10/18/22  1:19 AM  Result Value Ref Range   Prothrombin Time 15.1 11.4 - 15.2 seconds   INR 1.2 0.8 - 1.2    Comment: (NOTE) INR goal varies based on device and disease states. Performed at [facility], [address], [place], [place] [address]   Lactic acid, plasma     Status: Abnormal   Collection Time: 10/18/22  1:19 AM  Result Value Ref Range   Lactic Acid, Venous 2.8 (HH) 0.5 - 1.9 mmol/L    Comment: CRITICAL RESULT CALLED TO, READ BACK BY AND VERIFIED WITH S. JOHNSON RN 10/18/22 @0230  BY J. WHITE Performed at [facility], [address] [place], [place] [address]   Iron and TIBC     Status: Abnormal (Preliminary result)   Collection Time: 10/18/22  1:19 AM  Result Value Ref Range   Iron <5 (L) 45 - 182 ug/dL   TIBC 425 956 - 387 ug/dL    Comment: Performed at [facility], [address] [place], [place] [address]   Saturation Ratios PENDING 17.9 - 39.5 %   UIBC PENDING ug/dL  Ferritin     Status: Abnormal   Collection Time: 10/18/22  1:19 AM  Result Value Ref Range   Ferritin 6 (L) 24 - 336 ng/mL    Comment: Performed at [facility], [address], [place], [place] [address]  CBG monitoring, ED     Status: None   Collection Time: 10/18/22  2:18 AM  Result Value Ref Range   Glucose-Capillary 86 70 - 99 mg/dL    Comment:  Glucose reference range applies only to samples taken after fasting for at least 8 hours.  Type and screen [facility]     Status: None (Preliminary result)   Collection Time: 10/18/22  2:46 AM  Result Value Ref Range   ABO/RH(D) O POS    Antibody Screen NEG    Sample Expiration 10/21/2022,2359    Unit Number [id number]    Blood Component Type RBC LR PHER2    Unit division 00    Status of Unit ISSUED    Transfusion Status OK TO TRANSFUSE    Crossmatch Result      Compatible Performed at [facility], [address], [place], [place] [address]    Unit Number [id number]    Blood Component Type RED CELLS,LR    Unit division 00    Status of Unit ISSUED    Transfusion Status OK TO TRANSFUSE    Crossmatch Result Compatible   Lactic acid, plasma     Status: None   Collection Time: 10/18/22  2:48 AM  Result Value Ref Range   Lactic Acid, Venous 1.8 0.5 - 1.9 mmol/L    Comment: Performed at [facility], [address] [place], [place] [address]  Prepare RBC (crossmatch)     Status: None   Collection Time: 10/18/22  3:30 AM  Result Value Ref Range   Order Confirmation      ORDER PROCESSED BY BLOOD BANK Performed at [facility], [address], [place], [place] [address]   POC occult blood, ED     Status: Abnormal   Collection Time: 10/18/22  3:49 AM  Result Value Ref Range   Fecal Occult Bld POSITIVE (A) NEGATIVE  CBC with Differential     Status: Abnormal   Collection Time: 10/18/22 11:10 AM  Result Value Ref Range   WBC 3.9 (L) 4.0 - 10.5 K/uL   RBC 3.05 (L) 4.22 - 5.81 MIL/uL   Hemoglobin 6.3 (LL) 13.0 - 17.0 g/dL    Comment: CRITICAL VALUE NOTED.  VALUE IS CONSISTENT WITH PREVIOUSLY REPORTED AND CALLED VALUE. REPEATED TO VERIFY POST TRANSFUSION SPECIMEN Reticulocyte Hemoglobin testing may be clinically indicated, consider ordering this additional test [id number]    HCT 21.7 (L) 39.0 - 52.0 %   MCV 71.1 (L) 80.0 - 100.0 fL     Comment: POST TRANSFUSION SPECIMEN REPEATED TO VERIFY DELTA CHECK NOTED    MCH 20.7 (L) 26.0 - 34.0 pg   MCHC 29.0 (L) 30.0 - 36.0 g/dL   RDW 10.2 (H) 72.5 - 36.6 %   Platelets 251 150 - 400 K/uL   nRBC 1.3 (H) 0.0 - 0.2 %   Neutrophils Relative % 59 %   Neutro Abs 2.3 1.7 - 7.7 K/uL   Lymphocytes Relative 27 %   Lymphs Abs 1.0 0.7 - 4.0 K/uL   Monocytes Relative 13 %   Monocytes Absolute 0.5 0.1 - 1.0 K/uL   Eosinophils Relative 1 %   Eosinophils Absolute 0.1 0.0 - 0.5 K/uL   Basophils Relative 0 %   Basophils Absolute 0.0 0.0 - 0.1 K/uL   Immature Granulocytes 0 %   Abs Immature Granulocytes 0.01 0.00 - 0.07 K/uL    Comment: Performed at [facility], [address] [place], [place] [address]  Urinalysis, Routine w reflex microscopic -Urine, Clean Catch     Status: None   Collection Time: 10/18/22  1:00 PM  Result Value Ref Range   Color, Urine YELLOW YELLOW   APPearance CLEAR CLEAR   Specific Gravity, Urine 1.020 1.005 - 1.030   pH 5.0  5.0 - 8.0   Glucose, UA NEGATIVE NEGATIVE mg/dL   Hgb urine dipstick NEGATIVE NEGATIVE   Bilirubin Urine NEGATIVE NEGATIVE   Ketones, ur NEGATIVE NEGATIVE mg/dL   Protein, ur NEGATIVE NEGATIVE mg/dL   Nitrite NEGATIVE NEGATIVE   Leukocytes,Ua NEGATIVE NEGATIVE    Comment: Performed at [facility], [address], [place], [place] [address]   DG Chest Portable 1 View  Result Date: 10/18/2022 CLINICAL DATA:  Cough EXAM: PORTABLE CHEST 1 VIEW COMPARISON:  02/16/2021 FINDINGS: Increased perihilar markings, favoring mild interstitial edema. No definite pleural effusions. No pneumothorax. Cardiomegaly. IMPRESSION: Cardiomegaly with suspected mild interstitial edema. No definite pleural effusions. Electronically Signed   By: Charline Bills M.D.   On: 10/18/2022 01:07   CT Head Wo Contrast  Result Date: 10/18/2022 CLINICAL DATA:  Altered mental status. Fall last Friday 10/13/2022. On blood thinners. EXAM: CT HEAD WITHOUT CONTRAST  TECHNIQUE: Contiguous axial images were obtained from the base of the skull through the vertex without intravenous contrast. RADIATION DOSE REDUCTION: This exam was performed according to the departmental dose-optimization program which includes automated exposure control, adjustment of the mA and/or kV according to patient size and/or use of iterative reconstruction technique. COMPARISON:  CT head 08/06/2020 FINDINGS: Brain: Exam is mildly compromised by streak artifact from metallic densities within the scalp. No intracranial hemorrhage, mass effect, or evidence of acute infarct. No hydrocephalus. No extra-axial fluid collection. Generalized cerebral atrophy. Ill-defined hypoattenuation within the cerebral white matter is nonspecific but consistent with chronic small vessel ischemic disease. Vascular: No hyperdense vessel. Intracranial arterial calcification. Skull: No fracture or focal lesion. Sinuses/Orbits: No acute finding. Other: None. IMPRESSION: 1. No evidence of acute intracranial abnormality. 2. Generalized cerebral atrophy and chronic small vessel ischemic disease. Electronically Signed   By: Minerva Fester M.D.   On: 10/18/2022 01:07    Pending Labs Unresulted Labs (From admission, onward)     Start     Ordered   10/19/22 0500  Comprehensive metabolic panel  Tomorrow morning,   R        10/18/22 0708   10/19/22 0500  CBC  Tomorrow morning,   R        10/18/22 0708   10/18/22 2200  CBC  Every 12 hours (non-specified),   R (with TIMED occurrences)      10/18/22 1039   10/18/22 0038  Blood culture (routine x 2)  BLOOD CULTURE X 2,   R (with STAT occurrences)     Question:  Release to patient  Answer:  Immediate   10/18/22 0037            Vitals/Pain Today's Vitals   10/18/22 1245 10/18/22 1315 10/18/22 1327 10/18/22 1400  BP: 121/65 (!) 142/81  118/77  Pulse: (!) 54 (!) 56  73  Resp: (!) Temp:   98 F (36.7 C)   TempSrc:   Oral   SpO2: (!) 63% (!) 88%  99%     Isolation Precautions No active isolations  Medications Medications  sodium chloride flush (NS) 0.9 % injection 3 mL (3 mLs Intravenous Given 10/18/22 1048)  acetaminophen (TYLENOL) tablet 650 mg (has no administration in time range)    Or  acetaminophen (TYLENOL) suppository 650 mg (has no administration in time range)  ondansetron (ZOFRAN) tablet 4 mg (has no administration in time range)    Or  ondansetron (ZOFRAN) injection 4 mg (has no administration in time range)  0.9 %  sodium chloride  infusion ( Intravenous New Bag/Given 10/18/22 0821)  pantoprazole (PROTONIX) injection 40 mg (40 mg Intravenous Given 10/18/22 1048)  0.9 %  sodium chloride infusion (Manually program via Guardrails IV Fluids) ( Intravenous New Bag/Given 10/18/22 0437)    Mobility walks with device     Focused Assessments Neuro Assessment Handoff:  Swallow screen pass? Yes    NIH Stroke Scale  Dizziness Present: No Headache Present: No Level of Consciousness (1a.)   : Alert, keenly responsive LOC Questions (1b. )   : Answers one question correctly LOC Commands (1c. )   : Performs both tasks correctly Best Gaze (2. )  : Normal Visual (3. )  : No visual loss Facial Palsy (4. )    : Normal symmetrical movements Motor Arm, Left (5a. )   : No drift Motor Arm, Right (5b. ) : No drift Motor Leg, Left (6a. )  : Some effort against gravity Motor Leg, Right (6b. ) : No drift Limb Ataxia (7. ): Present in one limb Sensory (8. )  : Mild-to-moderate sensory loss, patient feels pinprick is less sharp or is dull on the affected side, or there is a loss of superficial pain with pinprick, but patient is aware of being touched Best Language (9. )  : No aphasia Dysarthria (10. ): Normal Extinction/Inattention (11.)   : No Abnormality Complete NIHSS TOTAL: 5     Neuro Assessment: Exceptions to WDL Neuro Checks:      Has TPA been given? No If patient is a Neuro Trauma and patient is going to OR before floor call  report to 4N Charge nurse: [phone] or [phone]   R Recommendations: See Admitting Provider Note  Report given to:   Additional Notes:

## 2022-10-19 DIAGNOSIS — D649 Anemia, unspecified: Secondary | ICD-10-CM | POA: Diagnosis not present

## 2022-10-19 DIAGNOSIS — I4891 Unspecified atrial fibrillation: Secondary | ICD-10-CM | POA: Diagnosis not present

## 2022-10-19 DIAGNOSIS — D509 Iron deficiency anemia, unspecified: Secondary | ICD-10-CM | POA: Diagnosis not present

## 2022-10-19 DIAGNOSIS — Z7901 Long term (current) use of anticoagulants: Secondary | ICD-10-CM

## 2022-10-19 DIAGNOSIS — Z8679 Personal history of other diseases of the circulatory system: Secondary | ICD-10-CM

## 2022-10-19 DIAGNOSIS — R195 Other fecal abnormalities: Secondary | ICD-10-CM

## 2022-10-19 DIAGNOSIS — K219 Gastro-esophageal reflux disease without esophagitis: Secondary | ICD-10-CM | POA: Diagnosis not present

## 2022-10-19 DIAGNOSIS — K922 Gastrointestinal hemorrhage, unspecified: Secondary | ICD-10-CM | POA: Diagnosis not present

## 2022-10-19 LAB — BPAM RBC
Blood Product Expiration Date: 202405162359
ISSUE DATE / TIME: 202404170433
Unit Type and Rh: 5100

## 2022-10-19 LAB — CBC
HCT: 24.6 % — ABNORMAL LOW (ref 39.0–52.0)
HCT: 28.6 % — ABNORMAL LOW (ref 39.0–52.0)
HCT: 25 % — ABNORMAL LOW (ref 39.0–52.0)
Hemoglobin: 7.6 g/dL — ABNORMAL LOW (ref 13.0–17.0)
Hemoglobin: 8.5 g/dL — ABNORMAL LOW (ref 13.0–17.0)
Hemoglobin: 7.7 g/dL — ABNORMAL LOW (ref 13.0–17.0)
MCH: 22 pg — ABNORMAL LOW (ref 26.0–34.0)
MCH: 22.1 pg — ABNORMAL LOW (ref 26.0–34.0)
MCH: 22.1 pg — ABNORMAL LOW (ref 26.0–34.0)
MCHC: 30.9 g/dL (ref 30.0–36.0)
MCHC: 29.7 g/dL — ABNORMAL LOW (ref 30.0–36.0)
MCHC: 30.8 g/dL (ref 30.0–36.0)
MCV: 71.1 fL — ABNORMAL LOW (ref 80.0–100.0)
MCV: 74.5 fL — ABNORMAL LOW (ref 80.0–100.0)
MCV: 71.8 fL — ABNORMAL LOW (ref 80.0–100.0)
Platelets: 234 10*3/uL (ref 150–400)
Platelets: 186 10*3/uL (ref 150–400)
Platelets: 242 10*3/uL (ref 150–400)
RBC: 3.46 MIL/uL — ABNORMAL LOW (ref 4.22–5.81)
RBC: 3.84 MIL/uL — ABNORMAL LOW (ref 4.22–5.81)
RBC: 3.48 MIL/uL — ABNORMAL LOW (ref 4.22–5.81)
RDW: 23.7 % — ABNORMAL HIGH (ref 11.5–15.5)
RDW: 24.1 % — ABNORMAL HIGH (ref 11.5–15.5)
RDW: 24 % — ABNORMAL HIGH (ref 11.5–15.5)
WBC: 4.8 10*3/uL (ref 4.0–10.5)
WBC: 4.3 10*3/uL (ref 4.0–10.5)
WBC: 5.4 10*3/uL (ref 4.0–10.5)
nRBC: 0.8 % — ABNORMAL HIGH (ref 0.0–0.2)
nRBC: 1.6 % — ABNORMAL HIGH (ref 0.0–0.2)
nRBC: 1.3 % — ABNORMAL HIGH (ref 0.0–0.2)

## 2022-10-19 LAB — TYPE AND SCREEN
ABO/RH(D): O POS
Antibody Screen: NEGATIVE
Unit division: 0

## 2022-10-19 LAB — COMPREHENSIVE METABOLIC PANEL
ALT: 24 U/L (ref 0–44)
AST: 26 U/L (ref 15–41)
Albumin: 2.9 g/dL — ABNORMAL LOW (ref 3.5–5.0)
Alkaline Phosphatase: 73 U/L (ref 38–126)
Anion gap: 10 (ref 5–15)
BUN: 13 mg/dL (ref 8–23)
CO2: 23 mmol/L (ref 22–32)
Calcium: 8.4 mg/dL — ABNORMAL LOW (ref 8.9–10.3)
Chloride: 102 mmol/L (ref 98–111)
Creatinine, Ser: 1.09 mg/dL (ref 0.61–1.24)
GFR, Estimated: >60 mL/min (ref >60)
Glucose, Bld: 97 mg/dL (ref 70–99)
Potassium: 4.3 mmol/L (ref 3.5–5.1)
Sodium: 135 mmol/L (ref 135–145)
Total Bilirubin: 1.1 mg/dL (ref 0.3–1.2)
Total Protein: 6.9 g/dL (ref 6.5–8.1)

## 2022-10-19 MED ORDER — SODIUM CHLORIDE 0.9 % IV SOLN
250.0000 mg | Freq: Every day | INTRAVENOUS | Status: AC
  Administered 2022-10-19 – 2022-10-22 (×4): 250 mg via INTRAVENOUS
  Filled 2022-10-19 (×5): qty 20

## 2022-10-19 MED ORDER — HYDROMORPHONE HCL 1 MG/ML IJ SOLN
0.5000 mg | INTRAMUSCULAR | Status: AC
  Administered 2022-10-19: 0.5 mg via INTRAVENOUS
  Filled 2022-10-19: qty 0.5

## 2022-10-19 MED ORDER — HYDROMORPHONE HCL 1 MG/ML IJ SOLN
0.5000 mg | INTRAMUSCULAR | Status: DC | PRN
  Administered 2022-10-19 – 2022-10-20 (×3): 0.5 mg via INTRAVENOUS
  Filled 2022-10-19 (×4): qty 0.5

## 2022-10-19 MED ORDER — OXYCODONE HCL 5 MG PO TABS
5.0000 mg | ORAL_TABLET | ORAL | Status: AC | PRN
  Administered 2022-10-19 – 2022-10-20 (×2): 5 mg via ORAL
  Filled 2022-10-19 (×2): qty 1

## 2022-10-19 MED ORDER — POLYETHYLENE GLYCOL 3350 17 G PO PACK: 17.0000 g | PACK | Freq: Every day | ORAL | Status: DC | PRN

## 2022-10-19 MED ORDER — HYDROMORPHONE HCL 1 MG/ML IJ SOLN
0.5000 mg | INTRAMUSCULAR | Status: AC | PRN
  Administered 2022-10-19 (×2): 0.5 mg via INTRAVENOUS
  Filled 2022-10-19 (×2): qty 0.5

## 2022-10-19 NOTE — H&P (View-Only) (Signed)
[facility] Progress Note  CC:  IDA, GI bleed  Subjective:  Feels fine.  No complaints.  No family at bedside.  Objective:  Vital signs in last 24 hours: Temp:  [97.6 F (36.4 C)-98.8 F (37.1 C)] 98.1 F (36.7 C) (04/18 0800) Pulse Rate:  [32-73] 56 (04/18 0800) Resp:  [17-27] 22 (04/18 1123) BP: (113-160)/(56-95) 136/95 (04/18 1123) SpO2:  [63 %-100 %] 95 % (04/18 1123) Last BM Date : 10/17/22 General:   Alert, Well-developed, in NAD Abdomen:  Soft, non-distended.  BS present.  Non-tender. Extremities:  Without edema. Neurologic:  Alert and oriented x 4;  grossly normal neurologically. Psych:  Alert and cooperative. Normal mood and affect.  Intake/Output from previous day: 04/17 0701 - 04/18 0700 In: 1360.7 [I.V.:712.7; Blood:648] Out: 1325 [Urine:1325]  Lab Results: Recent Labs    10/18/22 2208 10/19/22 0248 10/19/22 1024  WBC 3.7* 4.8 4.3  HGB 6.5* 7.6* 8.5*  HCT 21.0* 24.6* 28.6*  PLT 224 234 186   BMET Recent Labs    10/18/22 0119 10/19/22 0248  NA 132* 135  K 3.9 4.3  CL 103 102  CO2 20* 23  GLUCOSE 107* 97  BUN 18 13  CREATININE 1.43* 1.09  CALCIUM 8.6* 8.4*   LFT Recent Labs    10/19/22 0248  PROT 6.9  ALBUMIN 2.9*  AST 26  ALT 24  ALKPHOS 73  BILITOT 1.1   PT/INR Recent Labs    10/18/22 0119  LABPROT 15.1  INR 1.2   DG Chest Portable 1 View  Result Date: 10/18/2022 CLINICAL DATA:  Cough EXAM: PORTABLE CHEST 1 VIEW COMPARISON:  02/16/2021 FINDINGS: Increased perihilar markings, favoring mild interstitial edema. No definite pleural effusions. No pneumothorax. Cardiomegaly. IMPRESSION: Cardiomegaly with suspected mild interstitial edema. No definite pleural effusions. Electronically Signed   By: Charline Bills M.D.   On: 10/18/2022 01:07   CT Head Wo Contrast  Result Date: 10/18/2022 CLINICAL DATA:  Altered mental status. Fall last Friday 10/13/2022. On blood thinners. EXAM: CT HEAD WITHOUT CONTRAST TECHNIQUE:  Contiguous axial images were obtained from the base of the skull through the vertex without intravenous contrast. RADIATION DOSE REDUCTION: This exam was performed according to the departmental dose-optimization program which includes automated exposure control, adjustment of the mA and/or kV according to patient size and/or use of iterative reconstruction technique. COMPARISON:  CT head 08/06/2020 FINDINGS: Brain: Exam is mildly compromised by streak artifact from metallic densities within the scalp. No intracranial hemorrhage, mass effect, or evidence of acute infarct. No hydrocephalus. No extra-axial fluid collection. Generalized cerebral atrophy. Ill-defined hypoattenuation within the cerebral white matter is nonspecific but consistent with chronic small vessel ischemic disease. Vascular: No hyperdense vessel. Intracranial arterial calcification. Skull: No fracture or focal lesion. Sinuses/Orbits: No acute finding. Other: None. IMPRESSION: 1. No evidence of acute intracranial abnormality. 2. Generalized cerebral atrophy and chronic small vessel ischemic disease. Electronically Signed   By: Minerva Fester M.D.   On: 10/18/2022 01:07    Assessment / Plan: 88 year old male presenting to the ED with a fall and confusion.  Found to have severe microcytic anemia.     Severe microcytic anemia / FOBT+ on Plavix, ASA and Xarelto.   Hemoglobin 5.1 (baseline unknown but was 14.2 in August 2022).  He reports dark stool for unknown length of time.  Unclear if on iron at home as it is listed on home med list but family says not taking. FOBT positive here.  Received 2 u PRBCs  with Hgb increase to 8.5 grams.   History of IDA. Limited details available. Previously followed by Hematology in 2021.  Unable to find any information in Care Everywhere other than Hematology notes from 2021.  Any previous GI workup of IDA is unknown.  Severe IDA on inpatient labs here.   GERD Not on treatment at home.   Atrial  fibrillation, on Xarelto which is now on hold   CAD and reported history of NSTEMI in Sept 2023.  On Plavix.  -Continue twice daily PPI. -He will need an EGD,  ideally a colonoscopy as well after plavix /Xarelto washout.  Given patient's recent confusion our team discussed the procedures with his granddaughter. The risks and benefits of EGD and colonoscopy with possible polypectomy / biopsies were discussed and the Granddaughter agrees to proceed.  Likely will plan for Monday, 4/22. -Needs IV iron infusions, will consult pharmacy. -Trend labs and transfuse further prn. -Will allow soft diet for now since procedure is several days away.   LOS: 1 day   Princella Pellegrini. Delona Clasby  10/19/2022, 11:31 AM

## 2022-10-19 NOTE — Progress Notes (Signed)
Iron came back undetectable on panel. Consulted to replace with IV iron.  Ferrlecit  IV qday x4 doses  Ulyses Southward, PharmD, [place], AAHIVP, CPP Infectious Disease Pharmacist 10/19/2022 11:45 AM

## 2022-10-19 NOTE — Progress Notes (Signed)
[facility] Progress Note  CC:  IDA, GI bleed  Subjective:  Feels fine.  No complaints.  No family at bedside.  Objective:  Vital signs in last 24 hours: Temp:  [97.6 F (36.4 C)-98.8 F (37.1 C)] 98.1 F (36.7 C) (04/18 0800) Pulse Rate:  [32-73] 56 (04/18 0800) Resp:  [17-27] 22 (04/18 1123) BP: (113-160)/(56-95) 136/95 (04/18 1123) SpO2:  [63 %-100 %] 95 % (04/18 1123) Last BM Date : 10/17/22 General:   Alert, Well-developed, in NAD Abdomen:  Soft, non-distended.  BS present.  Non-tender. Extremities:  Without edema. Neurologic:  Alert and oriented x 4;  grossly normal neurologically. Psych:  Alert and cooperative. Normal mood and affect.  Intake/Output from previous day: 04/17 0701 - 04/18 0700 In: 1360.7 [I.V.:712.7; Blood:648] Out: 1325 [Urine:1325]  Lab Results: Recent Labs    10/18/22 2208 10/19/22 0248 10/19/22 1024  WBC 3.7* 4.8 4.3  HGB 6.5* 7.6* 8.5*  HCT 21.0* 24.6* 28.6*  PLT 224 234 186   BMET Recent Labs    10/18/22 0119 10/19/22 0248  NA 132* 135  K 3.9 4.3  CL 103 102  CO2 20* 23  GLUCOSE 107* 97  BUN 18 13  CREATININE 1.43* 1.09  CALCIUM 8.6* 8.4*   LFT Recent Labs    10/19/22 0248  PROT 6.9  ALBUMIN 2.9*  AST 26  ALT 24  ALKPHOS 73  BILITOT 1.1   PT/INR Recent Labs    10/18/22 0119  LABPROT 15.1  INR 1.2   DG Chest Portable 1 View  Result Date: 10/18/2022 CLINICAL DATA:  Cough EXAM: PORTABLE CHEST 1 VIEW COMPARISON:  02/16/2021 FINDINGS: Increased perihilar markings, favoring mild interstitial edema. No definite pleural effusions. No pneumothorax. Cardiomegaly. IMPRESSION: Cardiomegaly with suspected mild interstitial edema. No definite pleural effusions. Electronically Signed   By: Charline Bills M.D.   On: 10/18/2022 01:07   CT Head Wo Contrast  Result Date: 10/18/2022 CLINICAL DATA:  Altered mental status. Fall last Friday 10/13/2022. On blood thinners. EXAM: CT HEAD WITHOUT CONTRAST TECHNIQUE:  Contiguous axial images were obtained from the base of the skull through the vertex without intravenous contrast. RADIATION DOSE REDUCTION: This exam was performed according to the departmental dose-optimization program which includes automated exposure control, adjustment of the mA and/or kV according to patient size and/or use of iterative reconstruction technique. COMPARISON:  CT head 08/06/2020 FINDINGS: Brain: Exam is mildly compromised by streak artifact from metallic densities within the scalp. No intracranial hemorrhage, mass effect, or evidence of acute infarct. No hydrocephalus. No extra-axial fluid collection. Generalized cerebral atrophy. Ill-defined hypoattenuation within the cerebral white matter is nonspecific but consistent with chronic small vessel ischemic disease. Vascular: No hyperdense vessel. Intracranial arterial calcification. Skull: No fracture or focal lesion. Sinuses/Orbits: No acute finding. Other: None. IMPRESSION: 1. No evidence of acute intracranial abnormality. 2. Generalized cerebral atrophy and chronic small vessel ischemic disease. Electronically Signed   By: Minerva Fester M.D.   On: 10/18/2022 01:07    Assessment / Plan: 88 year old male presenting to the ED with a fall and confusion.  Found to have severe microcytic anemia.     Severe microcytic anemia / FOBT+ on Plavix, ASA and Xarelto.   Hemoglobin 5.1 (baseline unknown but was 14.2 in August 2022).  He reports dark stool for unknown length of time.  Unclear if on iron at home as it is listed on home med list but family says not taking. FOBT positive here.  Received 2 u PRBCs  with Hgb increase to 8.5 grams.   History of IDA. Limited details available. Previously followed by Hematology in 2021.  Unable to find any information in Care Everywhere other than Hematology notes from 2021.  Any previous GI workup of IDA is unknown.  Severe IDA on inpatient labs here.   GERD Not on treatment at home.   Atrial  fibrillation, on Xarelto which is now on hold   CAD and reported history of NSTEMI in Sept 2023.  On Plavix.  -Continue twice daily PPI. -He will need an EGD,  ideally a colonoscopy as well after plavix /Xarelto washout.  Given patient's recent confusion our team discussed the procedures with his granddaughter. The risks and benefits of EGD and colonoscopy with possible polypectomy / biopsies were discussed and the Granddaughter agrees to proceed.  Likely will plan for Monday, 4/22. -Needs IV iron infusions, will consult pharmacy. -Trend labs and transfuse further prn. -Will allow soft diet for now since procedure is several days away.   LOS: 1 day   Princella Pellegrini. Jourdin Connors  10/19/2022, 11:31 AM
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
## 2022-10-20 DIAGNOSIS — K922 Gastrointestinal hemorrhage, unspecified: Secondary | ICD-10-CM | POA: Diagnosis not present

## 2022-10-20 DIAGNOSIS — D649 Anemia, unspecified: Secondary | ICD-10-CM | POA: Diagnosis not present

## 2022-10-20 LAB — BASIC METABOLIC PANEL
Anion gap: 8 (ref 5–15)
BUN: 8 mg/dL (ref 8–23)
CO2: 22 mmol/L (ref 22–32)
Calcium: 8.4 mg/dL — ABNORMAL LOW (ref 8.9–10.3)
Chloride: 104 mmol/L (ref 98–111)
Creatinine, Ser: 1.02 mg/dL (ref 0.61–1.24)
GFR, Estimated: >60 mL/min (ref >60)
Glucose, Bld: 140 mg/dL — ABNORMAL HIGH (ref 70–99)
Potassium: 4 mmol/L (ref 3.5–5.1)
Sodium: 134 mmol/L — ABNORMAL LOW (ref 135–145)

## 2022-10-20 LAB — CBC
HCT: 29.5 % — ABNORMAL LOW (ref 39.0–52.0)
Hemoglobin: 8.6 g/dL — ABNORMAL LOW (ref 13.0–17.0)
MCH: 21.8 pg — ABNORMAL LOW (ref 26.0–34.0)
MCHC: 29.2 g/dL — ABNORMAL LOW (ref 30.0–36.0)
MCV: 74.7 fL — ABNORMAL LOW (ref 80.0–100.0)
Platelets: 230 10*3/uL (ref 150–400)
RBC: 3.95 MIL/uL — ABNORMAL LOW (ref 4.22–5.81)
RDW: 24.5 % — ABNORMAL HIGH (ref 11.5–15.5)
WBC: 5.2 10*3/uL (ref 4.0–10.5)
nRBC: 2.1 % — ABNORMAL HIGH (ref 0.0–0.2)

## 2022-10-20 LAB — CULTURE, BLOOD (ROUTINE X 2)

## 2022-10-20 MED ORDER — PEG-KCL-NACL-NASULF-NA ASC-C 100 G PO SOLR: 1.0000 | Freq: Once | ORAL | Status: DC

## 2022-10-20 MED ORDER — PEG-KCL-NACL-NASULF-NA ASC-C 100 G PO SOLR
0.5000 | Freq: Once | ORAL | Status: AC
  Administered 2022-10-23: 100 g via ORAL
  Filled 2022-10-20: qty 1

## 2022-10-20 MED ORDER — PEG-KCL-NACL-NASULF-NA ASC-C 100 G PO SOLR
0.5000 | Freq: Once | ORAL | Status: AC
  Administered 2022-10-22: 100 g via ORAL
  Filled 2022-10-20 (×2): qty 1

## 2022-10-20 MED ORDER — ROPINIROLE HCL 0.25 MG PO TABS
0.2500 mg | ORAL_TABLET | Freq: Every day | ORAL | Status: AC
  Administered 2022-10-20 – 2022-10-22 (×3): 0.25 mg via ORAL
  Filled 2022-10-20 (×3): qty 1
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
## 2022-10-20 NOTE — Care Management (Signed)
  Transition of Care [facility]) Screening Note   Patient Details  Name: Tristan Gardner Date of Birth: [birth year]   Transition of Care [facility]) CM/SW Contact:    Gordy Clement, RN Phone Number: 10/20/2022, 3:32 PM    Transition of Care Department [facility]) has reviewed patient chart.  Patient is from home. TOC will continue to monitor patient advancement through interdisciplinary progression rounds. If new patient transition needs arise, please place a TOC consult.

## 2022-10-20 NOTE — Care Management Important Message (Signed)
Important Message  Patient Details  Name: Tristan Gardner MRN: [id number] Date of Birth: [birth year]   Medicare Important Message Given:  Yes     [name] Stefan Church 10/20/2022, 1:33 PM
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
## 2022-10-20 NOTE — Progress Notes (Signed)
Pt was c/o leg pain an spasms so I got him up and put him in the chair to see if that would help.Dr. Margo Aye stopped by to see the pt and ordered new meds for his restless legs, did a complete linen change on the patients bed

## 2022-10-21 ENCOUNTER — Inpatient Hospital Stay (HOSPITAL_COMMUNITY): Payer: Medicare PPO

## 2022-10-21 DIAGNOSIS — I4891 Unspecified atrial fibrillation: Secondary | ICD-10-CM | POA: Diagnosis not present

## 2022-10-21 DIAGNOSIS — D649 Anemia, unspecified: Secondary | ICD-10-CM | POA: Diagnosis not present

## 2022-10-21 LAB — CBC
HCT: 29.1 % — ABNORMAL LOW (ref 39.0–52.0)
Hemoglobin: 8.8 g/dL — ABNORMAL LOW (ref 13.0–17.0)
MCH: 22.2 pg — ABNORMAL LOW (ref 26.0–34.0)
MCHC: 30.2 g/dL (ref 30.0–36.0)
MCV: 73.3 fL — ABNORMAL LOW (ref 80.0–100.0)
Platelets: 254 10*3/uL (ref 150–400)
RBC: 3.97 MIL/uL — ABNORMAL LOW (ref 4.22–5.81)
RDW: 25.2 % — ABNORMAL HIGH (ref 11.5–15.5)
WBC: 5 10*3/uL (ref 4.0–10.5)
nRBC: 1.8 % — ABNORMAL HIGH (ref 0.0–0.2)

## 2022-10-21 LAB — BASIC METABOLIC PANEL
Anion gap: 10 (ref 5–15)
BUN: 6 mg/dL — ABNORMAL LOW (ref 8–23)
CO2: 22 mmol/L (ref 22–32)
Calcium: 8.7 mg/dL — ABNORMAL LOW (ref 8.9–10.3)
Chloride: 103 mmol/L (ref 98–111)
Creatinine, Ser: 1.02 mg/dL (ref 0.61–1.24)
GFR, Estimated: >60 mL/min (ref >60)
Glucose, Bld: 111 mg/dL — ABNORMAL HIGH (ref 70–99)
Potassium: 4.1 mmol/L (ref 3.5–5.1)
Sodium: 135 mmol/L (ref 135–145)

## 2022-10-21 LAB — CULTURE, BLOOD (ROUTINE X 2): Culture: NO GROWTH

## 2022-10-21 MED ORDER — ENOXAPARIN SODIUM 40 MG/0.4ML IJ SOSY
40.0000 mg | PREFILLED_SYRINGE | INTRAMUSCULAR | Status: DC
  Administered 2022-10-21 – 2022-10-22 (×2): 40 mg via SUBCUTANEOUS
  Filled 2022-10-21 (×2): qty 0.4

## 2022-10-21 NOTE — Progress Notes (Signed)
Call received from CT for inquiry to transport pt for ordered imaging.  Orders reviewed for travel restrictions.  Validated okay for transport. Telemetry notified of pending departure.
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
## 2022-10-21 NOTE — Progress Notes (Signed)
Mobility Specialist - Progress Note   10/21/22 1127  Mobility  Activity Transferred from bed to chair  Level of Assistance Moderate assist, patient does 50-74%  Assistive Device Front wheel walker  Activity Response Tolerated well  Mobility Referral Yes  $Mobility charge 1 Mobility   Pt was received in bed and agreeable to mobility. Pt c/o being tired during session. Pt was left in chair with all needs met. RN aware.   Alda Lea  Mobility Specialist Please contact via Special educational needs teacher or Rehab office at [phone]

## 2022-10-22 ENCOUNTER — Inpatient Hospital Stay (HOSPITAL_COMMUNITY): Payer: Medicare PPO

## 2022-10-22 DIAGNOSIS — R1032 Left lower quadrant pain: Secondary | ICD-10-CM

## 2022-10-22 DIAGNOSIS — I4891 Unspecified atrial fibrillation: Secondary | ICD-10-CM | POA: Diagnosis not present

## 2022-10-22 DIAGNOSIS — D649 Anemia, unspecified: Secondary | ICD-10-CM | POA: Diagnosis not present

## 2022-10-22 LAB — BASIC METABOLIC PANEL
Anion gap: 10 (ref 5–15)
BUN: <5 mg/dL — ABNORMAL LOW (ref 8–23)
CO2: 25 mmol/L (ref 22–32)
Calcium: 8.8 mg/dL — ABNORMAL LOW (ref 8.9–10.3)
Chloride: 100 mmol/L (ref 98–111)
Creatinine, Ser: 0.88 mg/dL (ref 0.61–1.24)
GFR, Estimated: >60 mL/min (ref >60)
Glucose, Bld: 115 mg/dL — ABNORMAL HIGH (ref 70–99)
Potassium: 3.9 mmol/L (ref 3.5–5.1)
Sodium: 135 mmol/L (ref 135–145)

## 2022-10-22 LAB — CULTURE, BLOOD (ROUTINE X 2): Special Requests: ADEQUATE

## 2022-10-22 LAB — CBC
HCT: 26.5 % — ABNORMAL LOW (ref 39.0–52.0)
Hemoglobin: 8 g/dL — ABNORMAL LOW (ref 13.0–17.0)
MCH: 22.1 pg — ABNORMAL LOW (ref 26.0–34.0)
MCHC: 30.2 g/dL (ref 30.0–36.0)
MCV: 73.2 fL — ABNORMAL LOW (ref 80.0–100.0)
Platelets: 239 10*3/uL (ref 150–400)
RBC: 3.62 MIL/uL — ABNORMAL LOW (ref 4.22–5.81)
RDW: 26.1 % — ABNORMAL HIGH (ref 11.5–15.5)
WBC: 5.2 10*3/uL (ref 4.0–10.5)
nRBC: 1.5 % — ABNORMAL HIGH (ref 0.0–0.2)
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
## 2022-10-22 NOTE — Progress Notes (Signed)
Mobility Specialist - Progress Note   10/22/22 1240  Mobility  Activity Dangled on edge of bed  Level of Assistance Moderate assist, patient does 50-74%  Activity Response Tolerated fair  Mobility Referral Yes  $Mobility charge 1 Mobility   Pt was received in bed and originally agreeable to mobility. Pt was ModA to EOB. Once EOB pt quested where he was going and when told he refused stating he had already been up in chair. Pt was returned to supine with all needs met.   Alda Lea  Mobility Specialist Please contact via Special educational needs teacher or Rehab office at [phone]

## 2022-10-22 NOTE — Anesthesia Preprocedure Evaluation (Signed)
Anesthesia Evaluation  Patient identified by MRN, date of birth, ID band Patient awake    Reviewed: Allergy & Precautions, NPO status , Patient's Chart, lab work & pertinent test results, reviewed documented beta blocker date and time   Airway Mallampati: II  TM Distance: >3 FB Neck ROM: Full    Dental  (+) Dental Advisory Given, Missing   Pulmonary COPD,  COPD inhaler, former smoker   Pulmonary exam normal breath sounds clear to auscultation       Cardiovascular hypertension, Pt. on medications and Pt. on home beta blockers + CAD and + Past MI  Normal cardiovascular exam+ dysrhythmias Atrial Fibrillation  Rhythm:Regular Rate:Normal  '21 ECHO: EF 50 to 55%. The LV has low normal function,global hypokinesis. Left ventricular diastolic function could not be evaluated.   2. RVF is normal. The right ventricular size is normal. There is normal pulmonary artery systolic pressure.   3. Right atrial size was mildly dilated.   4. The mitral valve is grossly normal. Mild to moderate MR. No evidence of MS.   5. TR is moderate.   6. The aortic valve is tricuspid. There is mild thickening of the AV. AI is not visualized. No AS     Neuro/Psych CVA, Residual Symptoms    GI/Hepatic   Endo/Other    Renal/GU      Musculoskeletal   Abdominal   Peds  Hematology  (+) Blood dyscrasia (Hb 8.0, plt 239k), anemia xarelto   Anesthesia Other Findings   Reproductive/Obstetrics                             Anesthesia Physical Anesthesia Plan  ASA: 3  Anesthesia Plan: MAC   Post-op Pain Management: Minimal or no pain anticipated   Induction:   PONV Risk Score and Plan: 1 and Treatment may vary due to age or medical condition  Airway Management Planned: Natural Airway and Nasal Cannula  Additional Equipment: None  Intra-op Plan:   Post-operative Plan:   Informed Consent: I have reviewed the patients  History and Physical, chart, labs and discussed the procedure including the risks, benefits and alternatives for the proposed anesthesia with the patient or authorized representative who has indicated his/her understanding and acceptance.     Dental advisory given  Plan Discussed with: CRNA  Anesthesia Plan Comments:         Anesthesia Quick Evaluation

## 2022-10-23 ENCOUNTER — Inpatient Hospital Stay (HOSPITAL_COMMUNITY): Payer: Medicare PPO | Admitting: Anesthesiology

## 2022-10-23 ENCOUNTER — Encounter (HOSPITAL_COMMUNITY): Payer: Self-pay | Admitting: Internal Medicine

## 2022-10-23 ENCOUNTER — Encounter (HOSPITAL_COMMUNITY)
Admission: EM | Disposition: A | Discharge status: Home Health | Payer: Self-pay | Source: Home / Self Care | Attending: Internal Medicine

## 2022-10-23 DIAGNOSIS — I2510 Atherosclerotic heart disease of native coronary artery without angina pectoris: Secondary | ICD-10-CM | POA: Diagnosis not present

## 2022-10-23 DIAGNOSIS — K259 Gastric ulcer, unspecified as acute or chronic, without hemorrhage or perforation: Secondary | ICD-10-CM

## 2022-10-23 DIAGNOSIS — R195 Other fecal abnormalities: Secondary | ICD-10-CM | POA: Diagnosis not present

## 2022-10-23 DIAGNOSIS — K250 Acute gastric ulcer with hemorrhage: Secondary | ICD-10-CM | POA: Diagnosis not present

## 2022-10-23 DIAGNOSIS — I10 Essential (primary) hypertension: Secondary | ICD-10-CM

## 2022-10-23 DIAGNOSIS — Z87891 Personal history of nicotine dependence: Secondary | ICD-10-CM

## 2022-10-23 DIAGNOSIS — D649 Anemia, unspecified: Secondary | ICD-10-CM | POA: Diagnosis not present

## 2022-10-23 DIAGNOSIS — K5520 Angiodysplasia of colon without hemorrhage: Secondary | ICD-10-CM | POA: Diagnosis not present

## 2022-10-23 DIAGNOSIS — K319 Disease of stomach and duodenum, unspecified: Secondary | ICD-10-CM | POA: Diagnosis not present

## 2022-10-23 DIAGNOSIS — I252 Old myocardial infarction: Secondary | ICD-10-CM

## 2022-10-23 DIAGNOSIS — K922 Gastrointestinal hemorrhage, unspecified: Secondary | ICD-10-CM

## 2022-10-23 DIAGNOSIS — I4891 Unspecified atrial fibrillation: Secondary | ICD-10-CM | POA: Diagnosis not present

## 2022-10-23 DIAGNOSIS — D509 Iron deficiency anemia, unspecified: Secondary | ICD-10-CM

## 2022-10-23 HISTORY — PX: COLONOSCOPY WITH PROPOFOL: SHX5780

## 2022-10-23 HISTORY — PX: ESOPHAGOGASTRODUODENOSCOPY (EGD) WITH PROPOFOL: SHX5813

## 2022-10-23 HISTORY — PX: BIOPSY: SHX5522

## 2022-10-23 LAB — CBC
HCT: 30.9 % — ABNORMAL LOW (ref 39.0–52.0)
Hemoglobin: 9 g/dL — ABNORMAL LOW (ref 13.0–17.0)
MCH: 22.1 pg — ABNORMAL LOW (ref 26.0–34.0)
MCHC: 29.1 g/dL — ABNORMAL LOW (ref 30.0–36.0)
MCV: 75.9 fL — ABNORMAL LOW (ref 80.0–100.0)
Platelets: 266 10*3/uL (ref 150–400)
RBC: 4.07 MIL/uL — ABNORMAL LOW (ref 4.22–5.81)
RDW: 26.9 % — ABNORMAL HIGH (ref 11.5–15.5)
WBC: 6.8 10*3/uL (ref 4.0–10.5)
nRBC: 0.9 % — ABNORMAL HIGH (ref 0.0–0.2)

## 2022-10-23 LAB — CULTURE, BLOOD (ROUTINE X 2): Culture: NO GROWTH

## 2022-10-23 LAB — BASIC METABOLIC PANEL
Anion gap: 9 (ref 5–15)
BUN: <5 mg/dL — ABNORMAL LOW (ref 8–23)
CO2: 29 mmol/L (ref 22–32)
Calcium: 9.2 mg/dL (ref 8.9–10.3)
Chloride: 101 mmol/L (ref 98–111)
Creatinine, Ser: 0.98 mg/dL (ref 0.61–1.24)
GFR, Estimated: >60 mL/min (ref >60)
Glucose, Bld: 108 mg/dL — ABNORMAL HIGH (ref 70–99)
Potassium: 4.7 mmol/L (ref 3.5–5.1)
Sodium: 139 mmol/L (ref 135–145)

## 2022-10-23 SURGERY — ESOPHAGOGASTRODUODENOSCOPY (EGD) WITH PROPOFOL
Anesthesia: Monitor Anesthesia Care

## 2022-10-23 MED ORDER — SODIUM CHLORIDE 0.9 % IV SOLN: INTRAVENOUS | Status: DC

## 2022-10-23 MED ORDER — LACTATED RINGERS IV SOLN: INTRAVENOUS | Status: DC

## 2022-10-23 MED ORDER — RIVAROXABAN 20 MG PO TABS
20.0000 mg | ORAL_TABLET | Freq: Every day | ORAL | Status: DC
  Administered 2022-10-23 – 2022-10-24 (×2): 20 mg via ORAL
  Filled 2022-10-23 (×2): qty 1

## 2022-10-23 MED ORDER — PROPOFOL 500 MG/50ML IV EMUL
INTRAVENOUS | Status: DC | PRN
  Administered 2022-10-23 (×2): 100 ug/kg/min via INTRAVENOUS

## 2022-10-23 MED ORDER — CLOPIDOGREL BISULFATE 75 MG PO TABS
75.0000 mg | ORAL_TABLET | Freq: Every day | ORAL | Status: DC
  Administered 2022-10-23 – 2022-10-24 (×2): 75 mg via ORAL
  Filled 2022-10-23 (×2): qty 1

## 2022-10-23 SURGICAL SUPPLY — 25 items
BLOCK BITE 60FR ADLT L/F BLUE (MISCELLANEOUS) ×2 IMPLANT
ELECT REM PT RETURN 9FT ADLT (ELECTROSURGICAL)
ELECTRODE REM PT RTRN 9FT ADLT (ELECTROSURGICAL) IMPLANT
FCP BXJMBJMB 240X2.8X (CUTTING FORCEPS)
FLOOR PAD 36X40 (MISCELLANEOUS) ×2
FORCEP RJ3 GP 1.8X160 W-NEEDLE (CUTTING FORCEPS) IMPLANT
FORCEPS BIOP RAD 4 LRG CAP 4 (CUTTING FORCEPS) IMPLANT
FORCEPS BIOP RJ4 240 W/NDL (CUTTING FORCEPS)
FORCEPS BXJMBJMB 240X2.8X (CUTTING FORCEPS) IMPLANT
INJECTOR/SNARE I SNARE (MISCELLANEOUS) IMPLANT
LUBRICANT JELLY 4.5OZ STERILE (MISCELLANEOUS) IMPLANT
MANIFOLD NEPTUNE II (INSTRUMENTS) IMPLANT
NDL SCLEROTHERAPY 25GX240 (NEEDLE) IMPLANT
NEEDLE SCLEROTHERAPY 25GX240 (NEEDLE) IMPLANT
PAD FLOOR 36X40 (MISCELLANEOUS) ×2 IMPLANT
PROBE APC STR FIRE (PROBE) IMPLANT
PROBE INJECTION GOLD (MISCELLANEOUS)
PROBE INJECTION GOLD 7FR (MISCELLANEOUS) IMPLANT
SNARE ROTATE MED OVAL 20MM (MISCELLANEOUS) IMPLANT
SNARE SHORT THROW 13M SML OVAL (MISCELLANEOUS) IMPLANT
SYR 50ML LL SCALE MARK (SYRINGE) IMPLANT
TRAP SPECIMEN MUCOUS 40CC (MISCELLANEOUS) IMPLANT
TUBING ENDO SMARTCAP PENTAX (MISCELLANEOUS) ×4 IMPLANT
TUBING IRRIGATION ENDOGATOR (MISCELLANEOUS) ×4 IMPLANT
WATER STERILE IRR 1000ML POUR (IV SOLUTION) IMPLANT

## 2022-10-23 NOTE — Op Note (Addendum)
[facility] Patient Name: Tristan Gardner Procedure Date : 10/23/2022 MRN: [id number] Attending MD: [name] MD, MD, [id number] Date of Birth: 09-05-1934 CSN: [id number] Age: 88 Admit Type: Inpatient Procedure:                Upper GI endoscopy Indications:              Unexplained iron deficiency anemia, Heme positive                            stool Providers:                [name] MD, MD, Lorenza Evangelist, RN,                            Kandice Robinsons, Technician Referring MD:              Medicines:                Monitored Anesthesia Care Complications:            No immediate complications. Estimated Blood Loss:     Estimated blood loss was minimal. Procedure:                Pre-Anesthesia Assessment:                           - Prior to the procedure, a History and Physical                            was performed, and patient medications and                            allergies were reviewed. The patient's tolerance of                            previous anesthesia was also reviewed. The risks                            and benefits of the procedure and the sedation                            options and risks were discussed with the patient.                            All questions were answered, and informed consent                            was obtained. Prior Anticoagulants: The patient has                            taken Plavix (clopidogrel), last dose was 5 days                            prior to procedure and Xarelto for 2 days prior to  the procedure. ASA Grade Assessment: III - A                            patient with severe systemic disease. After                            reviewing the risks and benefits, the patient was                            deemed in satisfactory condition to undergo the                            procedure.                           After obtaining informed consent, the endoscope  was                            passed under direct vision. Throughout the                            procedure, the patient's blood pressure, pulse, and                            oxygen saturations were monitored continuously. The                            [id number]-H190 ([id number] Olympus endoscope was introduced                            through the mouth, and advanced to the second part                            of duodenum. The upper GI endoscopy was                            accomplished without difficulty. The patient                            tolerated the procedure well. Scope In: Scope Out: Findings:      The esophagus was normal.      Two non-bleeding superficial gastric ulcers with no stigmata of bleeding       were found in the gastric antrum. The largest lesion was 6 mm in largest       dimension. There were scattered erosions and erythema throughout the       antrum and distal body. Biopsies were taken from the antrum, body, and       fundus with a cold forceps for histology. Estimated blood loss was       minimal.      The examined duodenum was normal.      The cardia and gastric fundus were normal on retroflexion. Impression:               - Normal esophagus.                           -  Non-bleeding gastric ulcers with no stigmata of                            bleeding. Biopsied.                           - Gastropathy and gastric erosions.                           - Normal examined duodenum. Recommendation:           - Proceed with colonoscopy today as previously                            planned.                           - No aspirin, ibuprofen, naproxen, or other                            non-steroidal anti-inflammatory drugs as able.                           - Continue pantoprazole 40 mg BID for at least 10                            weeks, then resume 40 mg daily.                           - Consider repeat EGD in 10 weeks to document                             healing if it is clinically appropriate at that                            time. Procedure Code(s):        --- Professional ---                           [phone], Esophagogastroduodenoscopy, flexible,                            transoral; with biopsy, single or multiple Diagnosis Code(s):        --- Professional ---                           K25.9, Gastric ulcer, unspecified as acute or                            chronic, without hemorrhage or perforation                           D50.9, Iron deficiency anemia, unspecified                           R19.5, Other fecal abnormalities CPT copyright 2022 American [employer]. All rights reserved. The codes documented  in this report are preliminary and upon coder review may  be revised to meet current compliance requirements. [name] MD, MD 10/23/2022 9:53:40 AM This report has been signed electronically. Number of Addenda: 0

## 2022-10-23 NOTE — Interval H&P Note (Signed)
History and Physical Interval Note:  10/23/2022 9:05 AM  Tristan Gardner  has presented today for surgery, with the diagnosis of Severe iron deficiency anemia.  The various methods of treatment have been discussed with the patient and family. After consideration of risks, benefits and other options for treatment, the patient has consented to  Procedure(s): ESOPHAGOGASTRODUODENOSCOPY (EGD) WITH PROPOFOL (N/A) COLONOSCOPY WITH PROPOFOL (N/A) as a surgical intervention.  The patient's history has been reviewed, patient examined, no change in status, stable for surgery.  I have reviewed the patient's chart and labs.  Questions were answered to the patient's satisfaction.     [name]

## 2022-10-23 NOTE — Anesthesia Postprocedure Evaluation (Signed)
Anesthesia Post Note  Patient: Shelley Pooley  Procedure(s) Performed: ESOPHAGOGASTRODUODENOSCOPY (EGD) WITH PROPOFOL BIOPSY COLONOSCOPY WITH PROPOFOL     Patient location during evaluation: Endoscopy Anesthesia Type: MAC Level of consciousness: awake and alert Pain management: pain level controlled Vital Signs Assessment: post-procedure vital signs reviewed and stable Respiratory status: spontaneous breathing, nonlabored ventilation, respiratory function stable and patient connected to nasal cannula oxygen Cardiovascular status: stable and blood pressure returned to baseline Postop Assessment: no apparent nausea or vomiting Anesthetic complications: no   No notable events documented.  Last Vitals:  Vitals:   10/23/22 1020 10/23/22 1635  BP: 136/88   Pulse: 84   Resp: 20 18  Temp:  36.7 C  SpO2: 98%     Last Pain:  Vitals:   10/23/22 1635  TempSrc: Oral  PainSc:                  Collene Schlichter

## 2022-10-23 NOTE — Transfer of Care (Signed)
Immediate Anesthesia Transfer of Care Note  Patient: Daymeon Fischman  Procedure(s) Performed: ESOPHAGOGASTRODUODENOSCOPY (EGD) WITH PROPOFOL BIOPSY COLONOSCOPY WITH PROPOFOL  Patient Location: Endoscopy Unit  Anesthesia Type:MAC  Level of Consciousness: awake and alert   Airway & Oxygen Therapy: Patient Spontanous Breathing  Post-op Assessment: Report given to RN, Post -op Vital signs reviewed and stable, and Patient moving all extremities X 4  Post vital signs: Reviewed and stable  Last Vitals:  Vitals Value Taken Time  BP 93/61 10/23/22 1002  Temp 36.7 C 10/23/22 1002  Pulse 102 10/23/22 1003  Resp 18 10/23/22 1003  SpO2 96 % 10/23/22 1003  Vitals shown include unvalidated device data.  Last Pain:  Vitals:   10/23/22 0838  TempSrc: Temporal  PainSc: 0-No pain      Patients Stated Pain Goal: 1 (10/18/22 1837)  Complications: No notable events documented.
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
## 2022-10-23 NOTE — Plan of Care (Signed)

## 2022-10-23 NOTE — Op Note (Addendum)
[facility] Patient Name: Tristan Gardner Procedure Date : 10/23/2022 MRN: [id number] Attending MD: [name] MD, MD, [id number] Date of Birth: 12/29/1934 CSN: [id number] Age: 88 Admit Type: Inpatient Procedure:                Colonoscopy Indications:              Heme positive stool, Unexplained iron deficiency                            anemia Providers:                [name] MD, MD, Lorenza Evangelist, RN,                            Kandice Robinsons, Technician Referring MD:              Medicines:                Monitored Anesthesia Care Complications:            No immediate complications. Estimated Blood Loss:     Estimated blood loss: none. Procedure:                Pre-Anesthesia Assessment:                           - Prior to the procedure, a History and Physical                            was performed, and patient medications and                            allergies were reviewed. The patient's tolerance of                            previous anesthesia was also reviewed. The risks                            and benefits of the procedure and the sedation                            options and risks were discussed with the patient.                            All questions were answered, and informed consent                            was obtained. Prior Anticoagulants: The patient has                            taken Plavix (clopidogrel), last dose was 5 days                            prior to procedure and Xarelto was held 2 days  prior to the procedure. ASA Grade Assessment: III -                            A patient with severe systemic disease. After                            reviewing the risks and benefits, the patient was                            deemed in satisfactory condition to undergo the                            procedure.                           After obtaining informed consent, the colonoscope                             was passed under direct vision. Throughout the                            procedure, the patient's blood pressure, pulse, and                            oxygen saturations were monitored continuously. The                            [id number] ([id number] Olympus coloscope was                            introduced through the anus and advanced to the the                            cecum, identified by appendiceal orifice and                            ileocecal valve. The colonoscopy was performed with                            moderate difficulty due to inadequate bowel prep.                            Over of thick liquid was removed during the                            procedure. The patient tolerated the procedure                            well. The quality of the bowel preparation was poor. Scope In: 9:37:12 AM Scope Out: 9:49:02 AM Scope Withdrawal Time: 0 hours 8 minutes 17 seconds  Total Procedure Duration: 0 hours 11 minutes 50 seconds  Findings:      The perianal and digital rectal examinations were normal.      A large amount of liquid  semi-liquid stool was found in the entire       colon, interfering with visualization. Small, medium, and flat lesions       could not be excluded due to the remaining stool.      A single medium-sized angiodysplastic lesion without bleeding was found       in the proximal ascending colon.      The exam was otherwise without abnormality on direct and retroflexion       views. Impression:               - Preparation of the colon was poor.                           - Stool in the entire examined colon.                           - A single non-bleeding colonic angiodysplastic                            lesion.                           - The examination was otherwise normal on direct                            and retroflexion views.                           - No specimens collected. Recommendation:           -  Patient has a contact number available for                            emergencies. The signs and symptoms of potential                            delayed complications were discussed with the                            patient. Return to normal activities tomorrow.                            Written discharge instructions were provided to the                            patient.                           - Advance diet as tolerated.                           - Continue present medications.                           - Okay to resume Plaxis and Xarelto today.                           - Serial hgb/hct with  transfusion as indicated.                           - Consider repeating colonoscopy with possible APC                            of the ascending colon AVM after a two day bowel                            prep and evaluation of the small bowel for                            additional AVMs if there is persistent anemia                            despite adequate treatment of his known gastric                            ulcers.                           - The findings and recommendations were discussed                            with the patient.                           - The findings and recommendations were discussed                            with the patient's family (daughter by phone).                           The [facility] inpatient GI team will move to stand-by.                            Please call with any questions or concerns during                            the remainder of this hospitalization. Procedure Code(s):        --- Professional ---                           [phone], Colonoscopy, flexible; diagnostic, including                            collection of specimen(s) by brushing or washing,                            when performed (separate procedure) Diagnosis Code(s):        --- Professional ---                           K55.20, Angiodysplasia of colon without  hemorrhage  R19.5, Other fecal abnormalities                           D50.9, Iron deficiency anemia, unspecified CPT copyright 2022 American Medical Association. All rights reserved. The codes documented in this report are preliminary and upon coder review may  be revised to meet current compliance requirements. [name] MD, MD 10/23/2022 10:05:08 AM This report has been signed electronically. Number of Addenda: 0

## 2022-10-23 NOTE — Progress Notes (Signed)
PT Cancellation Note  Patient Details Name: Tristan Gardner MRN: [id number] DOB: 1934-08-25   Cancelled Treatment:    Reason Eval/Treat Not Completed: Patient at procedure or test/unavailable. Patient off unit for EGD and colonoscopy. Will re-attempt at later time/date.    Rocco Kerkhoff 10/23/2022, 9:07 AM

## 2022-10-23 NOTE — TOC Initial Note (Signed)
Transition of Care (TOC) - Initial/Assessment Note    Patient Details  Name: Tristan Gardner MRN: [id number] Date of Birth: 14-Nov-1934  Transition of Care [facility]) CM/SW Contact:    Kermit Balo, RN Phone Number: 10/23/2022, 1:57 PM  Clinical Narrative:                 Pt is from home with family. His granddaughter is at the bedside.  Pt was driving self prior to admission but family is planning to provide needed transportation. Pt's granddaughter over sees his medications and the daughter orders his meds. They deny any issues.  [facility]: [facility] [address] Pt with orders for home heath services. Granddaughter states they have used [facility] in the past and asked to use them again. [facility] with [facility] accepted and information on the AVS. Pt has transportation home when medically ready for d/c.   Expected Discharge Plan: Home w Home Health Services Barriers to Discharge: Continued Medical Work up   Patient Goals and CMS Choice   CMS Medicare.gov Compare Post Acute Care list provided to:: Patient Represenative (must comment) Choice offered to / list presented to : Adult Children      Expected Discharge Plan and Services     Post Acute Care Choice: Home Health Living arrangements for the past 2 months: Single Family Home                           HH Arranged: PT, OT, Nurse's Aide HH Agency: [facility] Date [facility] Agency Contacted: 10/23/22   Representative spoke with at [facility] Agency: Kandee Keen  Prior Living Arrangements/Services Living arrangements for the past 2 months: Single Family Home Lives with:: Adult Children Patient language and need for interpreter reviewed:: Yes Do you feel safe going back to the place where you live?: Yes        Care giver support system in place?: Yes (comment) Current home services: DME (rollator/ cane/ shower seat) Criminal Activity/Legal Involvement Pertinent to Current Situation/Hospitalization: No - Comment as needed  Activities of  Daily Living Home Assistive Devices/Equipment: Bedside commode/3-in-1, Walker (specify type) ADL Screening (condition at time of admission) Patient's cognitive ability adequate to safely complete daily activities?: Yes Is the patient deaf or have difficulty hearing?: Yes Does the patient have difficulty concentrating, remembering, or making decisions?: No Patient able to express need for assistance with ADLs?: Yes Does the patient have difficulty dressing or bathing?: Yes Independently performs ADLs?: No Communication: Needs assistance Is this a change from baseline?: Pre-admission baseline Dressing (OT): Needs assistance Is this a change from baseline?: Pre-admission baseline Grooming: Needs assistance Is this a change from baseline?: Pre-admission baseline Feeding: Independent Is this a change from baseline?: Pre-admission baseline Bathing: Needs assistance Is this a change from baseline?: Pre-admission baseline Toileting: Needs assistance Is this a change from baseline?: Pre-admission baseline In/Out Bed: Needs assistance Is this a change from baseline?: Pre-admission baseline Walks in Home: Needs assistance Is this a change from baseline?: Pre-admission baseline Does the patient have difficulty walking or climbing stairs?: Yes Weakness of Legs: Both Weakness of Arms/Hands: Both  Permission Sought/Granted                  Emotional Assessment Appearance:: Appears stated age Attitude/Demeanor/Rapport: Engaged Affect (typically observed): Accepting Orientation: : Oriented to Self, Oriented to Place, Oriented to  Time, Oriented to Situation   Psych Involvement: No (comment)  Admission diagnosis:  Weakness [R53.1] AKI (acute kidney  injury) [N17.9] Symptomatic anemia [D64.9] Gastrointestinal hemorrhage, unspecified gastrointestinal hemorrhage type [K92.2] Patient Active Problem List   Diagnosis Date Noted   Acute gastric ulcer with hemorrhage 10/23/2022    Gastrointestinal hemorrhage 10/23/2022   Iron deficiency anemia 10/19/2022   Heme positive stool 10/19/2022   Symptomatic anemia 10/18/2022   Chest pain, rule out acute myocardial infarction 08/06/2020   Elevated blood pressure reading 08/06/2020   H/O iron deficiency anemia 08/06/2020   Atrial flutter 05/18/2020   Viral URI with cough 05/18/2020   Atypical chest pain 05/17/2020   Atrial fibrillation 03/11/2020   PCP:  Porfirio Oar, PA Pharmacy:   [id number] - [place] [address] [place] [address] Phone: [phone] Fax: [phone]  [facility] [address] [place] [address] Phone: [phone] Fax: [phone]     Social Determinants of Health (SDOH) Social History: SDOH Screenings   Tobacco Use: Medium Risk (10/23/2022)   SDOH Interventions:     Readmission Risk Interventions     No data to display

## 2022-10-23 NOTE — Anesthesia Procedure Notes (Signed)
Procedure Name: MAC Date/Time: 10/23/2022 9:16 AM  Performed by: Nils Pyle, CRNAPre-anesthesia Checklist: Patient identified, Emergency Drugs available, Suction available and Patient being monitored Patient Re-evaluated:Patient Re-evaluated prior to induction Oxygen Delivery Method: Simple face mask Preoxygenation: Pre-oxygenation with 100% oxygen Induction Type: IV induction Placement Confirmation: positive ETCO2 and breath sounds checked- equal and bilateral Dental Injury: Teeth and Oropharynx as per pre-operative assessment

## 2022-10-24 ENCOUNTER — Other Ambulatory Visit (HOSPITAL_COMMUNITY): Payer: Self-pay

## 2022-10-24 ENCOUNTER — Encounter: Payer: Self-pay | Admitting: Gastroenterology

## 2022-10-24 ENCOUNTER — Encounter (HOSPITAL_COMMUNITY): Payer: Self-pay | Admitting: Gastroenterology

## 2022-10-24 DIAGNOSIS — K250 Acute gastric ulcer with hemorrhage: Secondary | ICD-10-CM | POA: Diagnosis not present

## 2022-10-24 DIAGNOSIS — D649 Anemia, unspecified: Secondary | ICD-10-CM | POA: Diagnosis not present

## 2022-10-24 LAB — CBC
HCT: 25.7 % — ABNORMAL LOW (ref 39.0–52.0)
Hemoglobin: 7.9 g/dL — ABNORMAL LOW (ref 13.0–17.0)
MCH: 22.7 pg — ABNORMAL LOW (ref 26.0–34.0)
MCHC: 30.7 g/dL (ref 30.0–36.0)
MCV: 73.9 fL — ABNORMAL LOW (ref 80.0–100.0)
Platelets: 223 10*3/uL (ref 150–400)
RBC: 3.48 MIL/uL — ABNORMAL LOW (ref 4.22–5.81)
RDW: 28 % — ABNORMAL HIGH (ref 11.5–15.5)
WBC: 5.6 10*3/uL (ref 4.0–10.5)
nRBC: 0.7 % — ABNORMAL HIGH (ref 0.0–0.2)

## 2022-10-24 LAB — BASIC METABOLIC PANEL
Anion gap: 8 (ref 5–15)
BUN: 5 mg/dL — ABNORMAL LOW (ref 8–23)
CO2: 24 mmol/L (ref 22–32)
Calcium: 8.5 mg/dL — ABNORMAL LOW (ref 8.9–10.3)
Chloride: 102 mmol/L (ref 98–111)
Creatinine, Ser: 0.9 mg/dL (ref 0.61–1.24)
GFR, Estimated: >60 mL/min (ref >60)
Glucose, Bld: 111 mg/dL — ABNORMAL HIGH (ref 70–99)
Potassium: 3.7 mmol/L (ref 3.5–5.1)
Sodium: 134 mmol/L — ABNORMAL LOW (ref 135–145)

## 2022-10-24 LAB — SURGICAL PATHOLOGY

## 2022-10-24 MED ORDER — ACETAMINOPHEN 325 MG PO TABS: 650.0000 mg | ORAL_TABLET | Freq: Four times a day (QID) | ORAL | Status: AC | PRN

## 2022-10-24 MED ORDER — PANTOPRAZOLE SODIUM 40 MG PO TBEC
40.0000 mg | DELAYED_RELEASE_TABLET | Freq: Two times a day (BID) | ORAL | 0 refills | Status: DC
  Filled 2022-10-24: qty 120, 60d supply, fill #0

## 2022-10-24 MED ORDER — LOSARTAN POTASSIUM 25 MG PO TABS: 25.0000 mg | ORAL_TABLET | Freq: Every day | ORAL | Status: DC

## 2022-10-24 NOTE — Discharge Instructions (Addendum)
Follow with Primary MD Porfirio Oar, PA in 7 days   Get CBC, CMP,  checked  by Primary MD next visit.    Activity: As tolerated with Full fall precautions use walker/cane & assistance as needed   Disposition Home    Diet: Heart Healthy   On your next visit with your primary care physician please Get Medicines reviewed and adjusted.   Please request your Prim.MD to go over all Hospital Tests and Procedure/Radiological results at the follow up, please get all Hospital records sent to your Prim MD by signing hospital release before you go home.   If you experience worsening of your admission symptoms, develop shortness of breath, life threatening emergency, suicidal or homicidal thoughts you must seek medical attention immediately by calling 911 or calling your MD immediately  if symptoms less severe.  You Must read complete instructions/literature along with all the possible adverse reactions/side effects for all the Medicines you take and that have been prescribed to you. Take any new Medicines after you have completely understood and accpet all the possible adverse reactions/side effects.   Do not drive, operating heavy machinery, perform activities at heights, swimming or participation in water activities or provide baby sitting services if your were admitted for syncope or siezures until you have seen by Primary MD or a Neurologist and advised to do so again.  Do not drive when taking Pain medications.    Do not take more than prescribed Pain, Sleep and Anxiety Medications  Special Instructions: If you have smoked or chewed Tobacco  in the last 2 yrs please stop smoking, stop any regular Alcohol  and or any Recreational drug use.  Wear Seat belts while driving.   Please note  You were cared for by a hospitalist during your hospital stay. If you have any questions about your discharge medications or the care you received while you were in the hospital after you are  discharged, you can call the unit and asked to speak with the hospitalist on call if the hospitalist that took care of you is not available. Once you are discharged, your primary care physician will handle any further medical issues. Please note that NO REFILLS for any discharge medications will be authorized once you are discharged, as it is imperative that you return to your primary care physician (or establish a relationship with a primary care physician if you do not have one) for your aftercare needs so that they can reassess your need for medications and monitor your lab values.
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
## 2022-10-24 NOTE — Plan of Care (Signed)

## 2022-10-24 NOTE — TOC Transition Note (Signed)
Transition of Care [facility]) - CM/SW Discharge Note   Patient Details  Name: Akul Leggette MRN: [id number] Date of Birth: October 28, 1934  Transition of Care [facility]) CM/SW Contact:  Gordy Clement, RN Phone Number: 10/24/2022, 9:09 AM   Clinical Narrative:     Patient to dc to home today. Home Health PT will be provided by [facility]. Family will transport to home  No additional TOC needs      Barriers to Discharge: Continued Medical Work up   Patient Goals and CMS Choice CMS Medicare.gov Compare Post Acute Care list provided to:: Patient Represenative (must comment) Choice offered to / list presented to : Adult Children  Discharge Placement                         Discharge Plan and Services Additional resources added to the After Visit Summary for       Post Acute Care Choice: Home Health                    HH Arranged: PT, OT, Nurse's Aide [facility] Agency: [facility] Date [facility] Agency Contacted: 10/23/22   Representative spoke with at [facility] Agency: Kandee Keen  Social Determinants of Health (SDOH) Interventions SDOH Screenings   Tobacco Use: Medium Risk (10/23/2022)     Readmission Risk Interventions     No data to display

## 2022-10-24 NOTE — Progress Notes (Signed)
PT Cancellation Note  Patient Details Name: Tristan Gardner MRN: [id number] DOB: Dec 08, 1934   Cancelled Treatment:    Reason Eval/Treat Not Completed: Patient declined, no reason specified. Attempted to see pt, but pt about to discharge. PT will follow up if pt remains admitted. Thank you.   [name] 10/24/2022, 9:58 AM

## 2022-12-23 ENCOUNTER — Encounter (HOSPITAL_COMMUNITY): Payer: Self-pay

## 2022-12-23 ENCOUNTER — Emergency Department (HOSPITAL_COMMUNITY): Payer: Medicare PPO

## 2022-12-23 ENCOUNTER — Inpatient Hospital Stay (HOSPITAL_COMMUNITY)
Admission: EM | Admit: 2022-12-23 | Discharge: 2023-01-06 | DRG: 516 | Disposition: A | Discharge status: Skilled Nursing Facility | Payer: Medicare PPO | Attending: Internal Medicine | Admitting: Internal Medicine

## 2022-12-23 ENCOUNTER — Other Ambulatory Visit: Payer: Self-pay

## 2022-12-23 DIAGNOSIS — E66811 Obesity, class 1: Secondary | ICD-10-CM

## 2022-12-23 DIAGNOSIS — Z91041 Radiographic dye allergy status: Secondary | ICD-10-CM

## 2022-12-23 DIAGNOSIS — Z833 Family history of diabetes mellitus: Secondary | ICD-10-CM

## 2022-12-23 DIAGNOSIS — R55 Syncope and collapse: Secondary | ICD-10-CM | POA: Diagnosis present

## 2022-12-23 DIAGNOSIS — R0902 Hypoxemia: Secondary | ICD-10-CM | POA: Diagnosis not present

## 2022-12-23 DIAGNOSIS — Z6834 Body mass index (BMI) 34.0-34.9, adult: Secondary | ICD-10-CM

## 2022-12-23 DIAGNOSIS — K219 Gastro-esophageal reflux disease without esophagitis: Secondary | ICD-10-CM | POA: Diagnosis not present

## 2022-12-23 DIAGNOSIS — M5416 Radiculopathy, lumbar region: Secondary | ICD-10-CM | POA: Diagnosis present

## 2022-12-23 DIAGNOSIS — R9341 Abnormal radiologic findings on diagnostic imaging of renal pelvis, ureter, or bladder: Secondary | ICD-10-CM | POA: Diagnosis present

## 2022-12-23 DIAGNOSIS — R292 Abnormal reflex: Secondary | ICD-10-CM | POA: Diagnosis present

## 2022-12-23 DIAGNOSIS — R7303 Prediabetes: Secondary | ICD-10-CM | POA: Diagnosis present

## 2022-12-23 DIAGNOSIS — Z8711 Personal history of peptic ulcer disease: Secondary | ICD-10-CM

## 2022-12-23 DIAGNOSIS — I160 Hypertensive urgency: Secondary | ICD-10-CM | POA: Diagnosis present

## 2022-12-23 DIAGNOSIS — I482 Chronic atrial fibrillation, unspecified: Secondary | ICD-10-CM | POA: Diagnosis present

## 2022-12-23 DIAGNOSIS — R252 Cramp and spasm: Secondary | ICD-10-CM | POA: Diagnosis present

## 2022-12-23 DIAGNOSIS — R471 Dysarthria and anarthria: Secondary | ICD-10-CM | POA: Diagnosis present

## 2022-12-23 DIAGNOSIS — I4892 Unspecified atrial flutter: Secondary | ICD-10-CM | POA: Diagnosis present

## 2022-12-23 DIAGNOSIS — I252 Old myocardial infarction: Secondary | ICD-10-CM

## 2022-12-23 DIAGNOSIS — D649 Anemia, unspecified: Secondary | ICD-10-CM | POA: Diagnosis present

## 2022-12-23 DIAGNOSIS — M48061 Spinal stenosis, lumbar region without neurogenic claudication: Principal | ICD-10-CM | POA: Diagnosis present

## 2022-12-23 DIAGNOSIS — E6609 Other obesity due to excess calories: Secondary | ICD-10-CM | POA: Diagnosis present

## 2022-12-23 DIAGNOSIS — K029 Dental caries, unspecified: Secondary | ICD-10-CM | POA: Diagnosis present

## 2022-12-23 DIAGNOSIS — K0889 Other specified disorders of teeth and supporting structures: Secondary | ICD-10-CM | POA: Clinically undetermined

## 2022-12-23 DIAGNOSIS — K769 Liver disease, unspecified: Secondary | ICD-10-CM | POA: Diagnosis present

## 2022-12-23 DIAGNOSIS — E785 Hyperlipidemia, unspecified: Secondary | ICD-10-CM | POA: Diagnosis present

## 2022-12-23 DIAGNOSIS — R296 Repeated falls: Secondary | ICD-10-CM | POA: Diagnosis present

## 2022-12-23 DIAGNOSIS — R519 Headache, unspecified: Secondary | ICD-10-CM | POA: Diagnosis present

## 2022-12-23 DIAGNOSIS — Z79899 Other long term (current) drug therapy: Secondary | ICD-10-CM

## 2022-12-23 DIAGNOSIS — I2510 Atherosclerotic heart disease of native coronary artery without angina pectoris: Secondary | ICD-10-CM | POA: Diagnosis present

## 2022-12-23 DIAGNOSIS — N179 Acute kidney failure, unspecified: Secondary | ICD-10-CM | POA: Diagnosis present

## 2022-12-23 DIAGNOSIS — I10 Essential (primary) hypertension: Secondary | ICD-10-CM | POA: Diagnosis present

## 2022-12-23 DIAGNOSIS — R253 Fasciculation: Secondary | ICD-10-CM | POA: Diagnosis not present

## 2022-12-23 DIAGNOSIS — R531 Weakness: Principal | ICD-10-CM

## 2022-12-23 DIAGNOSIS — Z8673 Personal history of transient ischemic attack (TIA), and cerebral infarction without residual deficits: Secondary | ICD-10-CM

## 2022-12-23 DIAGNOSIS — Z9181 History of falling: Secondary | ICD-10-CM

## 2022-12-23 DIAGNOSIS — R29818 Other symptoms and signs involving the nervous system: Secondary | ICD-10-CM | POA: Diagnosis present

## 2022-12-23 DIAGNOSIS — G629 Polyneuropathy, unspecified: Secondary | ICD-10-CM

## 2022-12-23 DIAGNOSIS — E871 Hypo-osmolality and hyponatremia: Secondary | ICD-10-CM | POA: Diagnosis not present

## 2022-12-23 DIAGNOSIS — R001 Bradycardia, unspecified: Secondary | ICD-10-CM | POA: Diagnosis present

## 2022-12-23 DIAGNOSIS — Z7901 Long term (current) use of anticoagulants: Secondary | ICD-10-CM

## 2022-12-23 DIAGNOSIS — G8929 Other chronic pain: Secondary | ICD-10-CM | POA: Diagnosis present

## 2022-12-23 DIAGNOSIS — I959 Hypotension, unspecified: Secondary | ICD-10-CM | POA: Diagnosis present

## 2022-12-23 DIAGNOSIS — K0381 Cracked tooth: Secondary | ICD-10-CM | POA: Diagnosis present

## 2022-12-23 DIAGNOSIS — Z7902 Long term (current) use of antithrombotics/antiplatelets: Secondary | ICD-10-CM

## 2022-12-23 DIAGNOSIS — Z8249 Family history of ischemic heart disease and other diseases of the circulatory system: Secondary | ICD-10-CM

## 2022-12-23 DIAGNOSIS — M1712 Unilateral primary osteoarthritis, left knee: Secondary | ICD-10-CM | POA: Diagnosis present

## 2022-12-23 DIAGNOSIS — Z87891 Personal history of nicotine dependence: Secondary | ICD-10-CM

## 2022-12-23 LAB — TYPE AND SCREEN
ABO/RH(D): O POS
Antibody Screen: NEGATIVE

## 2022-12-23 LAB — COMPREHENSIVE METABOLIC PANEL
ALT: 24 U/L (ref 0–44)
AST: 31 U/L (ref 15–41)
Albumin: 3.1 g/dL — ABNORMAL LOW (ref 3.5–5.0)
Alkaline Phosphatase: 84 U/L (ref 38–126)
Anion gap: 12 (ref 5–15)
BUN: 12 mg/dL (ref 8–23)
CO2: 19 mmol/L — ABNORMAL LOW (ref 22–32)
Calcium: 8.8 mg/dL — ABNORMAL LOW (ref 8.9–10.3)
Chloride: 104 mmol/L (ref 98–111)
Creatinine, Ser: 1.3 mg/dL — ABNORMAL HIGH (ref 0.61–1.24)
GFR, Estimated: 54 mL/min — ABNORMAL LOW (ref >60)
Glucose, Bld: 97 mg/dL (ref 70–99)
Potassium: 4.4 mmol/L (ref 3.5–5.1)
Sodium: 135 mmol/L (ref 135–145)
Total Bilirubin: 0.4 mg/dL (ref 0.3–1.2)
Total Protein: 7.6 g/dL (ref 6.5–8.1)

## 2022-12-23 LAB — DIFFERENTIAL
Abs Immature Granulocytes: 0.01 10*3/uL (ref 0.00–0.07)
Basophils Absolute: 0 10*3/uL (ref 0.0–0.1)
Basophils Relative: 0 %
Eosinophils Absolute: 0.1 10*3/uL (ref 0.0–0.5)
Eosinophils Relative: 2 %
Immature Granulocytes: 0 %
Lymphocytes Relative: 23 %
Lymphs Abs: 1.1 10*3/uL (ref 0.7–4.0)
Monocytes Absolute: 0.6 10*3/uL (ref 0.1–1.0)
Monocytes Relative: 12 %
Neutro Abs: 2.9 10*3/uL (ref 1.7–7.7)
Neutrophils Relative %: 63 %

## 2022-12-23 LAB — CBC
HCT: 34.4 % — ABNORMAL LOW (ref 39.0–52.0)
Hemoglobin: 10.3 g/dL — ABNORMAL LOW (ref 13.0–17.0)
MCH: 25.1 pg — ABNORMAL LOW (ref 26.0–34.0)
MCHC: 29.9 g/dL — ABNORMAL LOW (ref 30.0–36.0)
MCV: 83.9 fL (ref 80.0–100.0)
Platelets: 203 10*3/uL (ref 150–400)
RBC: 4.1 MIL/uL — ABNORMAL LOW (ref 4.22–5.81)
RDW: 22.6 % — ABNORMAL HIGH (ref 11.5–15.5)
WBC: 4.6 10*3/uL (ref 4.0–10.5)
nRBC: 0 % (ref 0.0–0.2)

## 2022-12-23 LAB — I-STAT CHEM 8, ED
BUN: 11 mg/dL (ref 8–23)
Calcium, Ion: 1.05 mmol/L — ABNORMAL LOW (ref 1.15–1.40)
Chloride: 107 mmol/L (ref 98–111)
Creatinine, Ser: 1.1 mg/dL (ref 0.61–1.24)
Glucose, Bld: 104 mg/dL — ABNORMAL HIGH (ref 70–99)
HCT: 36 % — ABNORMAL LOW (ref 39.0–52.0)
Hemoglobin: 12.2 g/dL — ABNORMAL LOW (ref 13.0–17.0)
Potassium: 4.4 mmol/L (ref 3.5–5.1)
Sodium: 138 mmol/L (ref 135–145)
TCO2: 23 mmol/L (ref 22–32)

## 2022-12-23 LAB — TROPONIN I (HIGH SENSITIVITY): Troponin I (High Sensitivity): 78 ng/L — ABNORMAL HIGH (ref <18)

## 2022-12-23 MED ORDER — SODIUM CHLORIDE 0.9 % IV SOLN
1000.0000 mL | INTRAVENOUS | Status: DC
  Administered 2022-12-23 – 2022-12-25 (×5): 1000 mL via INTRAVENOUS

## 2022-12-23 MED ORDER — SODIUM CHLORIDE 0.9 % IV BOLUS (SEPSIS)
1000.0000 mL | Freq: Once | INTRAVENOUS | Status: AC
  Administered 2022-12-23: 1000 mL via INTRAVENOUS

## 2022-12-23 NOTE — ED Triage Notes (Signed)
Patient was BIB EMS today from home. Over the last week patient has been having some issues with his balance with weakness involved. Today he is unable to bear any weight to left or right leg. He is having lower left side abdominal pain. Patient has had some falls. Patient has been experiencing low blood pressure today. Some right sided facial droop noted over the last week. Some ataxia noted earlier but is better now. Speech is clear now was very garbled when EMS first arrived on scene.

## 2022-12-23 NOTE — ED Provider Notes (Signed)
[facility] Provider Note   CSN: [id number] Arrival date & time: 12/23/22  1847     History {Add pertinent medical, surgical, social history, OB history to HPI:1} Chief Complaint  Patient presents with   Hypotension   Fall   Abdominal Pain    Tristan Gardner is a 88 y.o. male.   Fall Associated symptoms include abdominal pain.  Abdominal Pain  Pt states yesterday he was ok but last night he started feeling poorly.  Pt ended up falling because he felt weak.  Pt is having some pain in his buttocks.   No abdominal pain.  No vomiting or diarrhea.  No blood in the stool.  No dark stools.  He has been having some headaches.  No cp or shortness of breath.    Home Medications Prior to Admission medications   Medication Sig Start Date End Date Taking? Authorizing Provider  acetaminophen (TYLENOL) 325 MG tablet Take 2 tablets (650 mg total) by mouth every 6 (six) hours as needed for mild pain (or Fever >/= 101). 10/24/22   Elgergawy, [name], MD  albuterol (VENTOLIN HFA) 108 (90 Base) MCG/ACT inhaler Inhale 2 puffs into the lungs every 4 (four) hours as needed for wheezing or shortness of breath. 02/24/20   [provider]  atorvastatin (LIPITOR) 40 MG tablet Take 40 mg by mouth at bedtime. 08/24/22   [provider]  baclofen (LIORESAL) 10 MG tablet Take 10 mg by mouth 3 (three) times daily.    [provider]  clopidogrel (PLAVIX) 75 MG tablet Take 75 mg by mouth daily. 08/24/22   [provider]  DULoxetine (CYMBALTA) 30 MG capsule Take 30 mg by mouth 2 (two) times daily.    [provider]  Ferrous Sulfate (IRON PO) Take 1 tablet by mouth daily.    [provider]  gabapentin (NEURONTIN) 300 MG capsule Take 300 mg by mouth 3 (three) times daily.    [provider]  isosorbide mononitrate (IMDUR) 30 MG 24 hr tablet Take 30 mg by mouth daily. 08/24/22   [provider]   losartan (COZAAR) 25 MG tablet Take 1 tablet (25 mg total) by mouth daily. 10/26/22   Elgergawy, [name], MD  metoprolol succinate (TOPROL-XL) 50 MG 24 hr tablet Take 50 mg by mouth daily. 08/24/22   [provider]  pantoprazole (PROTONIX) 40 MG tablet Take 1 tablet (40 mg total) by mouth 2 (two) times daily before a meal. 10/24/22 12/23/22  Elgergawy, [name], MD  traZODone (DESYREL) 50 MG tablet Take 50 mg by mouth at bedtime.    [provider]  XARELTO 20 MG TABS tablet Take 20 mg by mouth every morning. 08/24/22   [provider]      Allergies    Iodine, Ferrous ammonium sulfate, and Shellfish allergy    Review of Systems   Review of Systems  Gastrointestinal:  Positive for abdominal pain.    Physical Exam Updated Vital Signs BP 132/69   Pulse (!) 56   Resp 19   Ht 1.803 m (5\' 11" )   Wt 111.1 kg   SpO2 100%   BMI 34.17 kg/m  Physical Exam Vitals and nursing note reviewed.  Constitutional:      General: He is not in acute distress.    Appearance: He is well-developed.  HENT:     Head: Normocephalic and atraumatic.     Right Ear: External ear normal.  Left Ear: External ear normal.  Eyes:     General: No visual field deficit or scleral icterus.       Right eye: No discharge.        Left eye: No discharge.     Conjunctiva/sclera: Conjunctivae normal.  Neck:     Trachea: No tracheal deviation.  Cardiovascular:     Rate and Rhythm: Normal rate and regular rhythm.  Pulmonary:     Effort: Pulmonary effort is normal. No respiratory distress.     Breath sounds: Normal breath sounds. No stridor. No wheezing or rales.  Abdominal:     General: Bowel sounds are normal. There is no distension.     Palpations: Abdomen is soft.     Tenderness: There is no abdominal tenderness. There is no guarding or rebound.  Musculoskeletal:        General: No deformity.     Cervical back: Normal and neck supple.     Thoracic back: Normal.     Lumbar back:  Tenderness present.     Right hip: No tenderness.     Left hip: No tenderness.     Right lower leg: No deformity or tenderness.     Left lower leg: No deformity or tenderness.  Skin:    General: Skin is warm and dry.     Findings: No rash.  Neurological:     General: No focal deficit present.     Mental Status: He is alert and oriented to person, place, and time.     Cranial Nerves: No cranial nerve deficit, dysarthria or facial asymmetry.     Sensory: No sensory deficit.     Motor: No abnormal muscle tone, seizure activity or pronator drift.     Coordination: Coordination normal.     Comments: Difficulty holding left leg off the bed but attributes to pain in his lower leg, sensation intact in all extremities,  no left or right sided neglect, , no nystagmus noted   Psychiatric:        Mood and Affect: Mood normal.     ED Results / Procedures / Treatments   Labs (all labs ordered are listed, but only abnormal results are displayed) Labs Reviewed  CBC - Abnormal; Notable for the following components:      Result Value   RBC 4.10 (*)    Hemoglobin 10.3 (*)    HCT 34.4 (*)    MCH 25.1 (*)    MCHC 29.9 (*)    RDW 22.6 (*)    All other components within normal limits  COMPREHENSIVE METABOLIC PANEL - Abnormal; Notable for the following components:   CO2 19 (*)    Creatinine, Ser 1.30 (*)    Calcium 8.8 (*)    Albumin 3.1 (*)    GFR, Estimated 54 (*)    All other components within normal limits  I-STAT CHEM 8, ED - Abnormal; Notable for the following components:   Glucose, Bld 104 (*)    Calcium, Ion 1.05 (*)    Hemoglobin 12.2 (*)    HCT 36.0 (*)    All other components within normal limits  TROPONIN I (HIGH SENSITIVITY) - Abnormal; Notable for the following components:   Troponin I (High Sensitivity) 78 (*)    All other components within normal limits  DIFFERENTIAL  URINALYSIS, ROUTINE W REFLEX MICROSCOPIC  TYPE AND SCREEN  TROPONIN I (HIGH SENSITIVITY)    EKG EKG  Interpretation  Date/Time:  Saturday December 23 2022 19:01:49 EDT Ventricular  Rate:  60 PR Interval:  168 QRS Duration: 99 QT Interval:  425 QTC Calculation: 425 R Axis:   -12 Text Interpretation: Sinus or ectopic atrial rhythm Low voltage, precordial leads Abnormal R-wave progression, early transition Minimal ST depression, inferior leads Confirmed by Linwood Dibbles [phone]) on 12/23/2022 7:43:35 PM  Radiology CT HEAD WO CONTRAST  Result Date: 12/23/2022 CLINICAL DATA:  Neuro deficit, acute, stroke suspected; Neck trauma (Age >= 65y) EXAM: CT HEAD WITHOUT CONTRAST CT CERVICAL SPINE WITHOUT CONTRAST TECHNIQUE: Multidetector CT imaging of the head and cervical spine was performed following the standard protocol without intravenous contrast. Multiplanar CT image reconstructions of the cervical spine were also generated. RADIATION DOSE REDUCTION: This exam was performed according to the departmental dose-optimization program which includes automated exposure control, adjustment of the mA and/or kV according to patient size and/or use of iterative reconstruction technique. COMPARISON:  CT head 10/21/2022 FINDINGS: CT HEAD FINDINGS Brain: Cerebral ventricle sizes are concordant with the degree of cerebral volume loss. Patchy and confluent areas of decreased attenuation are noted throughout the deep and periventricular white matter of the cerebral hemispheres bilaterally, compatible with chronic microvascular ischemic disease. No evidence of large-territorial acute infarction. No parenchymal hemorrhage. No mass lesion. No extra-axial collection. No mass effect or midline shift. No hydrocephalus. Basilar cisterns are patent. Vascular: No hyperdense vessel. Skull: No acute fracture or focal lesion. Sinuses/Orbits: Paranasal sinuses and mastoid air cells are clear. Redemonstration of right orbit surgical changes. Otherwise the orbits are unremarkable. Other: Similar retained metallic densities along the scalp. CT  CERVICAL SPINE FINDINGS Alignment: Normal. Skull base and vertebrae: Markedly limited evaluation due to motion artifact. Multilevel degenerative changes. Question osseous neural foraminal stenosis. No severe osseous central canal stenosis. Soft tissues and spinal canal: No prevertebral fluid or swelling. No visible canal hematoma. Upper chest: Unremarkable. Other: None. IMPRESSION: 1. No acute intracranial abnormality. 2. Markedly limited evaluation of the spine for fracture due to motion artifact. Recommend repeat Electronically Signed   By: Tish Frederickson M.D.   On: 12/23/2022 21:40   CT Cervical Spine Wo Contrast  Result Date: 12/23/2022 CLINICAL DATA:  Neuro deficit, acute, stroke suspected; Neck trauma (Age >= 65y) EXAM: CT HEAD WITHOUT CONTRAST CT CERVICAL SPINE WITHOUT CONTRAST TECHNIQUE: Multidetector CT imaging of the head and cervical spine was performed following the standard protocol without intravenous contrast. Multiplanar CT image reconstructions of the cervical spine were also generated. RADIATION DOSE REDUCTION: This exam was performed according to the departmental dose-optimization program which includes automated exposure control, adjustment of the mA and/or kV according to patient size and/or use of iterative reconstruction technique. COMPARISON:  CT head 10/21/2022 FINDINGS: CT HEAD FINDINGS Brain: Cerebral ventricle sizes are concordant with the degree of cerebral volume loss. Patchy and confluent areas of decreased attenuation are noted throughout the deep and periventricular white matter of the cerebral hemispheres bilaterally, compatible with chronic microvascular ischemic disease. No evidence of large-territorial acute infarction. No parenchymal hemorrhage. No mass lesion. No extra-axial collection. No mass effect or midline shift. No hydrocephalus. Basilar cisterns are patent. Vascular: No hyperdense vessel. Skull: No acute fracture or focal lesion. Sinuses/Orbits: Paranasal sinuses  and mastoid air cells are clear. Redemonstration of right orbit surgical changes. Otherwise the orbits are unremarkable. Other: Similar retained metallic densities along the scalp. CT CERVICAL SPINE FINDINGS Alignment: Normal. Skull base and vertebrae: Markedly limited evaluation due to motion artifact. Multilevel degenerative changes. Question osseous neural foraminal stenosis. No severe osseous central canal stenosis. Soft tissues and spinal canal:  No prevertebral fluid or swelling. No visible canal hematoma. Upper chest: Unremarkable. Other: None. IMPRESSION: 1. No acute intracranial abnormality. 2. Markedly limited evaluation of the spine for fracture due to motion artifact. Recommend repeat Electronically Signed   By: Tish Frederickson M.D.   On: 12/23/2022 21:40   DG Chest 1 View  Result Date: 12/23/2022 CLINICAL DATA:  Weakness, fell EXAM: CHEST  1 VIEW COMPARISON:  10/18/2022 FINDINGS: Single frontal view of the chest demonstrates a stable enlarged cardiac silhouette. No acute airspace disease, effusion, or pneumothorax. No acute displaced fracture. IMPRESSION: 1. Stable chest, no acute process. Electronically Signed   By: Sharlet Salina M.D.   On: 12/23/2022 20:41   DG Lumbar Spine Complete  Result Date: 12/23/2022 CLINICAL DATA:  Weakness, fell EXAM: LUMBAR SPINE - COMPLETE 4+ VIEW COMPARISON:  None Available. FINDINGS: Frontal, bilateral oblique, lateral views of the lumbar spine are obtained. There are 5 non-rib-bearing lumbar type vertebral bodies in grossly normal anatomic alignment. No acute displaced fracture. Multilevel lumbar spondylosis and facet hypertrophy greatest at L4-5 and L5-S1. Sacroiliac joints are normal. IMPRESSION: 1. Lower lumbar spondylosis and facet hypertrophy. No acute fracture. Electronically Signed   By: Sharlet Salina M.D.   On: 12/23/2022 20:40   DG Thoracic Spine 2 View  Result Date: 12/23/2022 CLINICAL DATA:  Weakness, fell EXAM: THORACIC SPINE 2 VIEWS COMPARISON:   None Available. FINDINGS: Frontal and lateral views of the thoracic spine are obtained. Alignment is anatomic. No acute fractures. Mild lower thoracic spondylosis. Paraspinal soft tissues are unremarkable. IMPRESSION: 1. No acute bony abnormality. Electronically Signed   By: Sharlet Salina M.D.   On: 12/23/2022 20:40   DG Tibia/Fibula Left  Result Date: 12/23/2022 CLINICAL DATA:  Weakness, fell, inability to bear weight EXAM: LEFT TIBIA AND FIBULA - 2 VIEW COMPARISON:  None Available. FINDINGS: Frontal and lateral views of the left tibia and fibula are obtained. Evidence of prior healed distal left tibial diaphyseal fracture. No acute fracture, subluxation, or dislocation. Moderate 3 compartmental left knee osteoarthritis. Left ankle is unremarkable. Soft tissues are normal. IMPRESSION: 1. No acute displaced fracture. 2. Moderate left knee osteoarthritis. Electronically Signed   By: Sharlet Salina M.D.   On: 12/23/2022 20:39   DG Pelvis 1-2 Views  Result Date: 12/23/2022 CLINICAL DATA:  Weakness, fell, inability to bear weight on left side EXAM: PELVIS - 1-2 VIEW COMPARISON:  None Available. FINDINGS: Supine frontal view of the pelvis was obtained. No evidence of fracture, subluxation, or dislocation within either hip. Mild symmetrical bilateral hip osteoarthritis. The remainder of the bony pelvis is unremarkable. Sacroiliac joints are normal. IMPRESSION: 1. No acute displaced fracture. 2. Symmetrical bilateral hip osteoarthritis. Electronically Signed   By: Sharlet Salina M.D.   On: 12/23/2022 20:38    Procedures Procedures  {Document cardiac monitor, telemetry assessment procedure when appropriate:1}  Medications Ordered in ED Medications  sodium chloride 0.9 % bolus 1,000 mL (0 mLs Intravenous Stopped 12/23/22 2228)    Followed by  0.9 %  sodium chloride infusion (1,000 mLs Intravenous New Bag/Given 12/23/22 2230)    ED Course/ Medical Decision Making/ A&P Clinical Course as of 12/24/22 0002   Sat Dec 23, 2022  2203 CBC(!) White blood cell count normal.  Hemoglobin improved compared to previous [JK]  2203 Head CT without acute findings.  C-spine CT without acute findings although limited eval [JK]  2203 Chest x-ray without acute process [JK]  2204 Knee x-ray without acute fracture, arthritis noted [JK]  2204 Hip  x-rays without fracture, arthritis noted [JK]    Clinical Course User Index [JK] Linwood Dibbles, MD   {   Click here for ABCD2, HEART and other calculatorsREFRESH Note before signing :1}                          Medical Decision Making Differential diagnosis includes but not limited to stroke, anemia, dehydration, electrolyte disturbance  Problems Addressed: Near syncope: acute illness or injury that poses a threat to life or bodily functions Weakness: acute illness or injury that poses a threat to life or bodily functions  Amount and/or Complexity of Data Reviewed Labs: ordered. Decision-making details documented in ED Course. Radiology: ordered.  Risk Prescription drug management.   Patient presented to the ED for evaluation of weakness and near syncope.  Patient has also had falls because of his weakness.  On exam patient noted to have weakness on his left side.  Concerning for the possibility of a stroke.  Patient CT scan does not show an acute stroke.  There is no definite C-spine injury.  X-rays do not show any signs of fracture.  Patient does have arthritis changes and that could be causing some of his knee discomfort.  Patient does have a slight elevation of troponin.  Is not currently having any chest pain.  Will continue to monitor.  With his near syncopal episodes weakness I think he warrants admission to hospital for further workup possible MRI.   {Document critical care time when appropriate:1} {Document review of labs and clinical decision tools ie heart score, Chads2Vasc2 etc:1}  {Document your independent review of radiology images, and any outside  records:1} {Document your discussion with family members, caretakers, and with consultants:1} {Document social determinants of health affecting pt's care:1} {Document your decision making why or why not admission, treatments were needed:1} Final Clinical Impression(s) / ED Diagnoses Final diagnoses:  Weakness  Near syncope    Rx / DC Orders ED Discharge Orders     None

## 2022-12-24 ENCOUNTER — Observation Stay (HOSPITAL_COMMUNITY): Payer: Medicare PPO

## 2022-12-24 DIAGNOSIS — R569 Unspecified convulsions: Secondary | ICD-10-CM

## 2022-12-24 DIAGNOSIS — R29818 Other symptoms and signs involving the nervous system: Secondary | ICD-10-CM

## 2022-12-24 DIAGNOSIS — G959 Disease of spinal cord, unspecified: Secondary | ICD-10-CM | POA: Diagnosis not present

## 2022-12-24 DIAGNOSIS — I160 Hypertensive urgency: Secondary | ICD-10-CM | POA: Diagnosis present

## 2022-12-24 DIAGNOSIS — I482 Chronic atrial fibrillation, unspecified: Secondary | ICD-10-CM

## 2022-12-24 DIAGNOSIS — I10 Essential (primary) hypertension: Secondary | ICD-10-CM | POA: Diagnosis not present

## 2022-12-24 DIAGNOSIS — R55 Syncope and collapse: Secondary | ICD-10-CM | POA: Insufficient documentation

## 2022-12-24 DIAGNOSIS — Z8673 Personal history of transient ischemic attack (TIA), and cerebral infarction without residual deficits: Secondary | ICD-10-CM

## 2022-12-24 DIAGNOSIS — G629 Polyneuropathy, unspecified: Secondary | ICD-10-CM

## 2022-12-24 LAB — CBC
HCT: 32.9 % — ABNORMAL LOW (ref 39.0–52.0)
Hemoglobin: 10.5 g/dL — ABNORMAL LOW (ref 13.0–17.0)
MCH: 26.2 pg (ref 26.0–34.0)
MCHC: 31.9 g/dL (ref 30.0–36.0)
MCV: 82 fL (ref 80.0–100.0)
Platelets: 171 10*3/uL (ref 150–400)
RBC: 4.01 MIL/uL — ABNORMAL LOW (ref 4.22–5.81)
RDW: 22.5 % — ABNORMAL HIGH (ref 11.5–15.5)
WBC: 4.8 10*3/uL (ref 4.0–10.5)
nRBC: 0 % (ref 0.0–0.2)

## 2022-12-24 LAB — URINALYSIS, ROUTINE W REFLEX MICROSCOPIC
Bilirubin Urine: NEGATIVE
Glucose, UA: NEGATIVE mg/dL
Hgb urine dipstick: NEGATIVE
Ketones, ur: NEGATIVE mg/dL
Leukocytes,Ua: NEGATIVE
Nitrite: NEGATIVE
Protein, ur: NEGATIVE mg/dL
Specific Gravity, Urine: 1.004 — ABNORMAL LOW (ref 1.005–1.030)
pH: 6 (ref 5.0–8.0)

## 2022-12-24 LAB — COMPREHENSIVE METABOLIC PANEL
ALT: 23 U/L (ref 0–44)
AST: 34 U/L (ref 15–41)
Albumin: 3 g/dL — ABNORMAL LOW (ref 3.5–5.0)
Alkaline Phosphatase: 69 U/L (ref 38–126)
Anion gap: 15 (ref 5–15)
BUN: 10 mg/dL (ref 8–23)
CO2: 20 mmol/L — ABNORMAL LOW (ref 22–32)
Calcium: 8.7 mg/dL — ABNORMAL LOW (ref 8.9–10.3)
Chloride: 102 mmol/L (ref 98–111)
Creatinine, Ser: 1.11 mg/dL (ref 0.61–1.24)
GFR, Estimated: >60 mL/min (ref >60)
Glucose, Bld: 127 mg/dL — ABNORMAL HIGH (ref 70–99)
Potassium: 4.4 mmol/L (ref 3.5–5.1)
Sodium: 137 mmol/L (ref 135–145)
Total Bilirubin: 0.6 mg/dL (ref 0.3–1.2)
Total Protein: 7.1 g/dL (ref 6.5–8.1)

## 2022-12-24 LAB — MAGNESIUM: Magnesium: 1.9 mg/dL (ref 1.7–2.4)

## 2022-12-24 LAB — TROPONIN I (HIGH SENSITIVITY): Troponin I (High Sensitivity): 78 ng/L — ABNORMAL HIGH (ref <18)

## 2022-12-24 LAB — PHOSPHORUS: Phosphorus: 3.6 mg/dL (ref 2.5–4.6)

## 2022-12-24 MED ORDER — ATORVASTATIN CALCIUM 40 MG PO TABS
40.0000 mg | ORAL_TABLET | Freq: Every day | ORAL | Status: DC
  Administered 2022-12-24 – 2023-01-05 (×13): 40 mg via ORAL
  Filled 2022-12-24 (×13): qty 1

## 2022-12-24 MED ORDER — ACETAMINOPHEN 325 MG PO TABS
650.0000 mg | ORAL_TABLET | Freq: Four times a day (QID) | ORAL | Status: DC | PRN
  Administered 2022-12-24 – 2023-01-06 (×17): 650 mg via ORAL
  Filled 2022-12-24 (×18): qty 2

## 2022-12-24 MED ORDER — METOPROLOL SUCCINATE ER 50 MG PO TB24
50.0000 mg | ORAL_TABLET | Freq: Every day | ORAL | Status: DC
  Administered 2022-12-24 – 2023-01-04 (×9): 50 mg via ORAL
  Filled 2022-12-24 (×13): qty 1

## 2022-12-24 MED ORDER — CLOPIDOGREL BISULFATE 75 MG PO TABS
75.0000 mg | ORAL_TABLET | Freq: Every day | ORAL | Status: DC
  Administered 2022-12-24: 75 mg via ORAL
  Filled 2022-12-24: qty 1

## 2022-12-24 MED ORDER — STROKE: EARLY STAGES OF RECOVERY BOOK
Freq: Once | Status: AC
  Filled 2022-12-24: qty 1

## 2022-12-24 MED ORDER — ACETAMINOPHEN 650 MG RE SUPP: 650.0000 mg | Freq: Four times a day (QID) | RECTAL | Status: DC | PRN

## 2022-12-24 MED ORDER — ONDANSETRON HCL 4 MG/2ML IJ SOLN: 4.0000 mg | Freq: Four times a day (QID) | INTRAMUSCULAR | Status: DC | PRN

## 2022-12-24 MED ORDER — ALBUTEROL SULFATE HFA 108 (90 BASE) MCG/ACT IN AERS: 2.0000 | INHALATION_SPRAY | RESPIRATORY_TRACT | Status: DC | PRN

## 2022-12-24 MED ORDER — ONDANSETRON HCL 4 MG PO TABS: 4.0000 mg | ORAL_TABLET | Freq: Four times a day (QID) | ORAL | Status: DC | PRN

## 2022-12-24 MED ORDER — GABAPENTIN 300 MG PO CAPS
300.0000 mg | ORAL_CAPSULE | Freq: Three times a day (TID) | ORAL | Status: DC
  Administered 2022-12-24 – 2023-01-06 (×38): 300 mg via ORAL
  Filled 2022-12-24 (×38): qty 1

## 2022-12-24 MED ORDER — ALBUTEROL SULFATE (2.5 MG/3ML) 0.083% IN NEBU: 2.5000 mg | INHALATION_SOLUTION | RESPIRATORY_TRACT | Status: DC | PRN

## 2022-12-24 MED ORDER — BACLOFEN 5 MG HALF TABLET
5.0000 mg | ORAL_TABLET | Freq: Once | ORAL | Status: DC | PRN
  Filled 2022-12-24: qty 1

## 2022-12-24 NOTE — ED Notes (Addendum)
Patient cleaned of soiled brief.

## 2022-12-24 NOTE — ED Notes (Signed)
Patient updated of delay in room assignment.

## 2022-12-24 NOTE — Progress Notes (Signed)
Pt is being transferred will check back
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
## 2022-12-24 NOTE — ED Notes (Signed)
Spoke with patient's daughter, updated daughter about patient being admitted to hospital. Provided patient with ice water.

## 2022-12-24 NOTE — Evaluation (Signed)
Physical Therapy Evaluation Patient Details Name: Tristan Gardner MRN: [id number] DOB: May 14, 1935 Today's Date: 12/24/2022  History of Present Illness  The pt is an 88 yo male presenting 6/22 with inability to bear weight in L or R LE, frequent falls, R sided facial droop, and intermittent ataxia. CT showed no acute abnormality, pt unable to get MRI. PMH includes: afib, CAD, HTN, NSTEMI, CVA, sciatica, chronic pain, and obesity.   Clinical Impression  Pt in bed upon arrival of PT, agreeable to evaluation at this time. Prior to admission the pt was ambulating with use of a rollator, but reports frequent falls due to L knee buckling and then R knee buckling recently. The pt now presents with limitations in functional mobility, strength, power, sensation, coordination, activity tolerance, and stability due to above dx, and will continue to benefit from skilled PT to address these deficits. He was unable to move LLE against gravity at this time and reports diminished sensation compared to RLE. However, with functional movements such as lateral scooting and stand-pivots, the pt was able to use LLE without any buckling this session, and managed small step with LLE to pivot to recliner. Will need continued skilled PT and more assist than is available at home after d/c, therefore recommend continued skilled rehab until pt independent with transfers and at lower risk of continued falls.       Recommendations for follow up therapy are one component of a multi-disciplinary discharge planning process, led by the attending physician.  Recommendations may be updated based on patient status, additional functional criteria and insurance authorization.  Follow Up Recommendations Can patient physically be transported by private vehicle: No     Assistance Recommended at Discharge Frequent or constant Supervision/Assistance  Patient can return home with the following  A lot of help with walking and/or transfers;A  little help with bathing/dressing/bathroom;Assistance with cooking/housework;Assist for transportation;Help with stairs or ramp for entrance    Equipment Recommendations Rolling walker (2 wheels)  Recommendations for Other Services       Functional Status Assessment Patient has had a recent decline in their functional status and demonstrates the ability to make significant improvements in function in a reasonable and predictable amount of time.     Precautions / Restrictions Precautions Precautions: Fall Precaution Comments: frequent falls prior to admission, pt describes L knee buckling first then R Restrictions Weight Bearing Restrictions: No      Mobility  Bed Mobility Overal bed mobility: Needs Assistance Bed Mobility: Supine to Sit     Supine to sit: Min assist     General bed mobility comments: minA to LE and minA to trunk    Transfers Overall transfer level: Needs assistance Equipment used: Rolling walker (2 wheels), None Transfers: Sit to/from Stand, Bed to chair/wheelchair/BSC Sit to Stand: Mod assist     Squat pivot transfers: Mod assist    Lateral/Scoot Transfers: Min guard General transfer comment: completed lateral scooting along bed without assist, great hip clearance and pt then agreeable to attempt pivot to chair. no buckling, did manage small pivotal step without LOB    Ambulation/Gait               General Gait Details: pt unable at this time  Modified Rankin (Stroke Patients Only) Modified Rankin (Stroke Patients Only) Pre-Morbid Rankin Score: Moderately severe disability Modified Rankin: Severe disability     Balance Overall balance assessment: Needs assistance Sitting-balance support: No upper extremity supported, Feet supported Sitting balance-Leahy Scale: Fair  Standing balance support: Bilateral upper extremity supported, Reliant on assistive device for balance, During functional activity Standing balance-Leahy Scale: Poor                                Pertinent Vitals/Pain Pain Assessment Pain Assessment: Faces Faces Pain Scale: Hurts a little bit Pain Location: LLE Pain Descriptors / Indicators: Discomfort Pain Intervention(s): Limited activity within patient's tolerance, Monitored during session, Repositioned    Home Living Family/patient expects to be discharged to:: Private residence Living Arrangements: Children (his daughter) Available Help at Discharge: Family;Available PRN/intermittently Type of Home: House Home Access: Level entry       Home Layout: One level Home Equipment: Rollator (4 wheels);Shower seat Additional Comments: pt reports living with his daughter (this is different from prior info in chart from 2 months prior)    Prior Function Prior Level of Function : Needs assist             Mobility Comments: Utilizes a rollator for mobility in his home and outside, utilizing scooters available in the community for community distances. Reports 2 recent falls ADLs Comments: pt reports daughter works at daycare and is not home during day, states he has difficulty in bathroom     Hand Dominance   Dominant Hand: Right    Extremity/Trunk Assessment   Upper Extremity Assessment Upper Extremity Assessment: Defer to OT evaluation    Lower Extremity Assessment Lower Extremity Assessment: LLE deficits/detail;RLE deficits/detail RLE Deficits / Details: grossly 4/5 to MMT with limited hip flexion. pt reports sensation is intact RLE Coordination: decreased fine motor;decreased gross motor LLE Deficits / Details: pt reports unable to move at toes against gravity. is able to demo slighty hip flexion and good hip abd/add. reports sensation diminished compared to RLE. no buckling with stance LLE Sensation: decreased light touch LLE Coordination: decreased fine motor;decreased gross motor    Cervical / Trunk Assessment Cervical / Trunk Assessment: Kyphotic;Other  exceptions Cervical / Trunk Exceptions: large body habitus  Communication   Communication: HOH  Cognition Arousal/Alertness: Awake/alert Behavior During Therapy: WFL for tasks assessed/performed Overall Cognitive Status: No family/caregiver present to determine baseline cognitive functioning                                 General Comments: pt needing repeated cues (possibly due to [place]), and reports he is very anxious about mobility due to fear of faling. pt giving slightly mixed reports of which leg was weak prior to admission, not oriented to time (got only year, not month or day)        General Comments General comments (skin integrity, edema, etc.): VSS on RA        Assessment/Plan    PT Assessment Patient needs continued PT services  PT Problem List Decreased strength;Decreased activity tolerance;Decreased balance;Decreased mobility;Decreased coordination       PT Treatment Interventions DME instruction;Gait training;Stair training;Functional mobility training;Therapeutic activities;Therapeutic exercise;Balance training;Neuromuscular re-education;Patient/family education    PT Goals (Current goals can be found in the Care Plan section)  Acute Rehab PT Goals Patient Stated Goal: return to independence and strength prior to return home PT Goal Formulation: With patient Time For Goal Achievement: 01/07/23 Potential to Achieve Goals: Good    Frequency Min 4X/week        AM-PAC PT "6 Clicks" Mobility  Outcome Measure Help needed turning from your back  to your side while in a flat bed without using bedrails?: A Little Help needed moving from lying on your back to sitting on the side of a flat bed without using bedrails?: A Little Help needed moving to and from a bed to a chair (including a wheelchair)?: A Lot Help needed standing up from a chair using your arms (e.g., wheelchair or bedside chair)?: A Lot Help needed to walk in hospital room?: Total Help  needed climbing 3-5 steps with a railing? : Total 6 Click Score: 12    End of Session Equipment Utilized During Treatment: Gait belt Activity Tolerance: Patient tolerated treatment well;Patient limited by fatigue Patient left: in chair;with call bell/phone within reach;with chair alarm set Nurse Communication: Mobility status PT Visit Diagnosis: Unsteadiness on feet (R26.81);Other abnormalities of gait and mobility (R26.89);Muscle weakness (generalized) (M62.81)    Time: 1610-9604 PT Time Calculation (min) (ACUTE ONLY): 35 min   Charges:   PT Evaluation $PT Eval Low Complexity: 1 Low PT Treatments $Gait Training: 8-22 mins        Vickki Muff, PT, DPT   Acute Rehabilitation Department Office [phone] Secure Chat Communication Preferred  Ronnie Derby 12/24/2022, 5:46 PM
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
## 2022-12-24 NOTE — ED Notes (Signed)
Admitting in room speaking with patient and family member

## 2022-12-24 NOTE — ED Notes (Signed)
ED TO INPATIENT HANDOFF REPORT  ED Nurse Name and Phone #: [phone]  S Name/Age/Gender Tristan Gardner 88 y.o. male Room/Bed: 004C/004C  Code Status   Code Status: Full Code  Home/SNF/Other Home Patient oriented to: self, place, time, and situation Is this baseline? Yes   Triage Complete: Triage complete  Chief Complaint Near syncope [R55] Neurological deficit, transient [R29.818]  Triage Note Patient was BIB EMS today from home. Over the last week patient has been having some issues with his balance with weakness involved. Today he is unable to bear any weight to left or right leg. He is having lower left side abdominal pain. Patient has had some falls. Patient has been experiencing low blood pressure today. Some right sided facial droop noted over the last week. Some ataxia noted earlier but is better now. Speech is clear now was very garbled when EMS first arrived on scene.    Allergies Allergies  Allergen Reactions   Iodine Hives, Itching and Rash         Shellfish Allergy Hives    Level of Care/Admitting Diagnosis ED Disposition     ED Disposition  Admit   Condition  --   Comment  Hospital Area: [facility] [[id number]]  Level of Care: Telemetry Medical [104]  May place patient in observation at [facility] or [facility] if equivalent level of care is available:: Yes  Covid Evaluation: Asymptomatic - no recent exposure (last 10 days) testing not required  Diagnosis: Neurological deficit, transient [130865]  Admitting Physician: Frankey Shown [[id number]]  Attending Physician: Frankey Shown [[id number]]          B Medical/Surgery History Past Medical History:  Diagnosis Date   Atrial fibrillation (HCC)    Coronary artery disease    Hypertension    NSTEMI (non-ST elevated myocardial infarction) (HCC)    Stroke [facility])    Past Surgical History:  Procedure Laterality Date   BIOPSY  10/23/2022   Procedure: BIOPSY;  Surgeon: [name], MD;  Location: [facility] ENDOSCOPY;  Service: Gastroenterology;;   CARDIAC SURGERY     COLONOSCOPY WITH PROPOFOL N/A 10/23/2022   Procedure: COLONOSCOPY WITH PROPOFOL;  Surgeon: [name], MD;  Location: [facility] ENDOSCOPY;  Service: Gastroenterology;  Laterality: N/A;   ESOPHAGOGASTRODUODENOSCOPY (EGD) WITH PROPOFOL N/A 10/23/2022   Procedure: ESOPHAGOGASTRODUODENOSCOPY (EGD) WITH PROPOFOL;  Surgeon: [name], MD;  Location: [facility] ENDOSCOPY;  Service: Gastroenterology;  Laterality: N/A;   ROTATOR CUFF REPAIR       A IV Location/Drains/Wounds Patient Lines/Drains/Airways Status     Active Line/Drains/Airways     Name Placement date Placement time Site Days   Peripheral IV 12/23/22 20 G Left;Posterior Hand 12/23/22  1911  Hand  1            Intake/Output Last 24 hours  Intake/Output Summary (Last 24 hours) at 12/24/2022 1238 Last data filed at 12/24/2022 0346 Gross per 24 hour  Intake 440 ml  Output 775 ml  Net -335 ml    Labs/Imaging Results for orders placed or performed during the hospital encounter of 12/23/22 (from the past 48 hour(s))  CBC     Status: Abnormal   Collection Time: 12/23/22  9:15 PM  Result Value Ref Range   WBC 4.6 4.0 - 10.5 K/uL   RBC 4.10 (L) 4.22 - 5.81 MIL/uL   Hemoglobin 10.3 (L) 13.0 - 17.0 g/dL   HCT 24.4 (L) 01.0 - 27.2 %   MCV 83.9 80.0 - 100.0 fL   MCH 25.1 (  L) 26.0 - 34.0 pg   MCHC 29.9 (L) 30.0 - 36.0 g/dL   RDW 16.1 (H) 09.6 - 04.5 %   Platelets 203 150 - 400 K/uL    Comment: REPEATED TO VERIFY   nRBC 0.0 0.0 - 0.2 %    Comment: Performed at [facility], [address], [place], [place] [address]  Differential     Status: None   Collection Time: 12/23/22  9:15 PM  Result Value Ref Range   Neutrophils Relative % 63 %   Neutro Abs 2.9 1.7 - 7.7 K/uL   Lymphocytes Relative 23 %   Lymphs Abs 1.1 0.7 - 4.0 K/uL   Monocytes Relative 12 %   Monocytes Absolute 0.6 0.1 - 1.0 K/uL   Eosinophils Relative 2 %    Eosinophils Absolute 0.1 0.0 - 0.5 K/uL   Basophils Relative 0 %   Basophils Absolute 0.0 0.0 - 0.1 K/uL   Immature Granulocytes 0 %   Abs Immature Granulocytes 0.01 0.00 - 0.07 K/uL    Comment: Performed at [facility], [address] [place], [place] [address]  Comprehensive metabolic panel     Status: Abnormal   Collection Time: 12/23/22  9:15 PM  Result Value Ref Range   Sodium 135 135 - 145 mmol/L   Potassium 4.4 3.5 - 5.1 mmol/L   Chloride 104 98 - 111 mmol/L   CO2 19 (L) 22 - 32 mmol/L   Glucose, Bld 97 70 - 99 mg/dL    Comment: Glucose reference range applies only to samples taken after fasting for at least 8 hours.   BUN 12 8 - 23 mg/dL   Creatinine, Ser 8.29 (H) 0.61 - 1.24 mg/dL   Calcium 8.8 (L) 8.9 - 10.3 mg/dL   Total Protein 7.6 6.5 - 8.1 g/dL   Albumin 3.1 (L) 3.5 - 5.0 g/dL   AST 31 15 - 41 U/L   ALT 24 0 - 44 U/L   Alkaline Phosphatase 84 38 - 126 U/L   Total Bilirubin 0.4 0.3 - 1.2 mg/dL   GFR, Estimated 54 (L) >60 mL/min    Comment: (NOTE) Calculated using the CKD-EPI Creatinine Equation (2021)    Anion gap 12 5 - 15    Comment: Performed at [facility], [address], [place], [place] [address]  Type and screen [facility]     Status: None   Collection Time: 12/23/22  9:15 PM  Result Value Ref Range   ABO/RH(D) O POS    Antibody Screen NEG    Sample Expiration      12/26/2022,2359 Performed at [facility], [address] [place], [place] [address]   Troponin I (High Sensitivity)     Status: Abnormal   Collection Time: 12/23/22  9:15 PM  Result Value Ref Range   Troponin I (High Sensitivity) 78 (H) <18 ng/L    Comment: (NOTE) Elevated high sensitivity troponin I (hsTnI) values and significant  changes across serial measurements may suggest ACS but many other  chronic and acute conditions are known to elevate hsTnI results.  Refer to the Links section for chest pain algorithms and additional   guidance. Performed at [facility], [address] [place], [place] [address]   Troponin I (High Sensitivity)     Status: Abnormal   Collection Time: 12/23/22 11:45 PM  Result Value Ref Range   Troponin I (High Sensitivity) 78 (H) <18 ng/L  Comment: (NOTE) Elevated high sensitivity troponin I (hsTnI) values and significant  changes across serial measurements may suggest ACS but many other  chronic and acute conditions are known to elevate hsTnI results.  Refer to the "Links" section for chest pain algorithms and additional  guidance. Performed at [facility], [address], [place], [place] [address]   I-stat chem 8, ED     Status: Abnormal   Collection Time: 12/23/22 11:46 PM  Result Value Ref Range   Sodium 138 135 - 145 mmol/L   Potassium 4.4 3.5 - 5.1 mmol/L   Chloride 107 98 - 111 mmol/L   BUN 11 8 - 23 mg/dL   Creatinine, Ser 4.69 0.61 - 1.24 mg/dL   Glucose, Bld 629 (H) 70 - 99 mg/dL    Comment: Glucose reference range applies only to samples taken after fasting for at least 8 hours.   Calcium, Ion 1.05 (L) 1.15 - 1.40 mmol/L   TCO2 23 22 - 32 mmol/L   Hemoglobin 12.2 (L) 13.0 - 17.0 g/dL   HCT 52.8 (L) 41.3 - 24.4 %  Urinalysis, Routine w reflex microscopic -Urine, Clean Catch     Status: Abnormal   Collection Time: 12/23/22 11:54 PM  Result Value Ref Range   Color, Urine STRAW (A) YELLOW   APPearance CLEAR CLEAR   Specific Gravity, Urine 1.004 (L) 1.005 - 1.030   pH 6.0 5.0 - 8.0   Glucose, UA NEGATIVE NEGATIVE mg/dL   Hgb urine dipstick NEGATIVE NEGATIVE   Bilirubin Urine NEGATIVE NEGATIVE   Ketones, ur NEGATIVE NEGATIVE mg/dL   Protein, ur NEGATIVE NEGATIVE mg/dL   Nitrite NEGATIVE NEGATIVE   Leukocytes,Ua NEGATIVE NEGATIVE    Comment: Performed at [facility], [address] [place], [place] [address]  Comprehensive metabolic panel     Status: Abnormal   Collection Time: 12/24/22  2:39 AM  Result Value Ref Range   Sodium 137 135  - 145 mmol/L   Potassium 4.4 3.5 - 5.1 mmol/L   Chloride 102 98 - 111 mmol/L   CO2 20 (L) 22 - 32 mmol/L   Glucose, Bld 127 (H) 70 - 99 mg/dL    Comment: Glucose reference range applies only to samples taken after fasting for at least 8 hours.   BUN 10 8 - 23 mg/dL   Creatinine, Ser 2.53 0.61 - 1.24 mg/dL   Calcium 8.7 (L) 8.9 - 10.3 mg/dL   Total Protein 7.1 6.5 - 8.1 g/dL   Albumin 3.0 (L) 3.5 - 5.0 g/dL   AST 34 15 - 41 U/L   ALT 23 0 - 44 U/L   Alkaline Phosphatase 69 38 - 126 U/L   Total Bilirubin 0.6 0.3 - 1.2 mg/dL   GFR, Estimated >66 >44 mL/min    Comment: (NOTE) Calculated using the CKD-EPI Creatinine Equation (2021)    Anion gap 15 5 - 15    Comment: Performed at [facility], [address], [place], [place] [address]  CBC     Status: Abnormal   Collection Time: 12/24/22  2:39 AM  Result Value Ref Range   WBC 4.8 4.0 - 10.5 K/uL   RBC 4.01 (L) 4.22 - 5.81 MIL/uL   Hemoglobin 10.5 (L) 13.0 - 17.0 g/dL   HCT 25.9 (L) 56.3 - 87.5 %   MCV 82.0 80.0 - 100.0 fL   MCH 26.2 26.0 - 34.0 pg   MCHC 31.9 30.0 - 36.0 g/dL   RDW 64.3 (  H) 11.5 - 15.5 %   Platelets 171 150 - 400 K/uL    Comment: REPEATED TO VERIFY   nRBC 0.0 0.0 - 0.2 %    Comment: Performed at [facility], [address], [place], [place] [address]  Magnesium     Status: None   Collection Time: 12/24/22  2:39 AM  Result Value Ref Range   Magnesium 1.9 1.7 - 2.4 mg/dL    Comment: Performed at [facility], [address] [place], [place] [address]  Phosphorus     Status: None   Collection Time: 12/24/22  2:39 AM  Result Value Ref Range   Phosphorus 3.6 2.5 - 4.6 mg/dL    Comment: Performed at [facility], [address] [place], [place] [address]   CT LUMBAR SPINE WO CONTRAST  Result Date: 12/24/2022 CLINICAL DATA:  Spinal cord injury, follow up EXAM: CT  THORACIC AND LUMBAR SPINE WITHOUT CONTRAST TECHNIQUE: Multidetector CT imaging of the thoracic and lumbar spine was  performed without intravenous contrast. Multiplanar CT image reconstructions were also generated. RADIATION DOSE REDUCTION: This exam was performed according to the departmental dose-optimization program which includes automated exposure control, adjustment of the mA and/or kV according to patient size and/or use of iterative reconstruction technique. COMPARISON:  Lumbar spine radiograph 12/23/22 FINDINGS: CT THORACIC SPINE FINDINGS Alignment: Normal. Vertebrae: No acute fracture or focal pathologic process. Paraspinal and other soft tissues: Trace right pleural effusion. Moderate coronary artery calcifications. Hypodense lesion of the upper pole of the right kidney favored to represent a simple renal cyst. Aortic atherosclerotic calcifications. 7 mm hypodense lesion of the inferior aspect of the right hepatic lobe is assessed due to the absence of IV contrast too small to accurately characterize. Disc levels: Evidence of high-grade spinal canal stenosis. CT LUMBAR SPINE FINDINGS Limitations: Assessment lumbar spine is slightly limited due to motion artifact, particularly in the lower lumbar spine within this limitation Segmentation:  5 lumbar type vertebral bodies. Alignment: Normal. Vertebrae: Within the limitations of a motion degraded exam, no evidence of an acute fracture. Vertebral body heights are preserved. Paraspinal and other soft tissues: Incidentally noted circumferential bladder wall thickening, which is nonspecific, but can be seen in the setting of cystitis or chronic bladder outlet obstruction. Correlate with urinalysis. Prostate is enlarged. Disc levels: There may be moderate spinal canal narrowing at L2-L3 and moderate to severe spinal canal narrowing at L4-L5. IMPRESSION: 1. Within the limitations of a motion degraded exam, no evidence of acute fracture or traumatic subluxation of the thoracic or lumbar spine. 2. Incidentally noted circumferential bladder wall thickening, which is nonspecific, but  can be seen in the setting of cystitis or chronic bladder outlet obstruction. Correlate with urinalysis. 3. Trace right pleural effusion. 4. Aortic atherosclerosis. 5. 7 mm hypodense lesion of the inferior aspect of the right hepatic lobe is assessed due to the absence of IV contrast too small to accurately characterize. Recommend further evaluation with a liver ultrasound. Aortic Atherosclerosis (ICD10-I70.0). Electronically Signed   By: Lorenza Cambridge M.D.   On: 12/24/2022 11:45   CT THORACIC SPINE WO CONTRAST  Result Date: 12/24/2022 CLINICAL DATA:  Spinal cord injury, follow up EXAM: CT  THORACIC AND LUMBAR SPINE WITHOUT CONTRAST TECHNIQUE: Multidetector CT imaging of the thoracic and lumbar spine was performed without intravenous contrast. Multiplanar CT image reconstructions were also generated. RADIATION DOSE REDUCTION: This exam was performed according to the departmental dose-optimization program which includes automated exposure control, adjustment of  the mA and/or kV according to patient size and/or use of iterative reconstruction technique. COMPARISON:  Lumbar spine radiograph 12/23/22 FINDINGS: CT THORACIC SPINE FINDINGS Alignment: Normal. Vertebrae: No acute fracture or focal pathologic process. Paraspinal and other soft tissues: Trace right pleural effusion. Moderate coronary artery calcifications. Hypodense lesion of the upper pole of the right kidney favored to represent a simple renal cyst. Aortic atherosclerotic calcifications. 7 mm hypodense lesion of the inferior aspect of the right hepatic lobe is assessed due to the absence of IV contrast too small to accurately characterize. Disc levels: Evidence of high-grade spinal canal stenosis. CT LUMBAR SPINE FINDINGS Limitations: Assessment lumbar spine is slightly limited due to motion artifact, particularly in the lower lumbar spine within this limitation Segmentation:  5 lumbar type vertebral bodies. Alignment: Normal. Vertebrae: Within the  limitations of a motion degraded exam, no evidence of an acute fracture. Vertebral body heights are preserved. Paraspinal and other soft tissues: Incidentally noted circumferential bladder wall thickening, which is nonspecific, but can be seen in the setting of cystitis or chronic bladder outlet obstruction. Correlate with urinalysis. Prostate is enlarged. Disc levels: There may be moderate spinal canal narrowing at L2-L3 and moderate to severe spinal canal narrowing at L4-L5. IMPRESSION: 1. Within the limitations of a motion degraded exam, no evidence of acute fracture or traumatic subluxation of the thoracic or lumbar spine. 2. Incidentally noted circumferential bladder wall thickening, which is nonspecific, but can be seen in the setting of cystitis or chronic bladder outlet obstruction. Correlate with urinalysis. 3. Trace right pleural effusion. 4. Aortic atherosclerosis. 5. 7 mm hypodense lesion of the inferior aspect of the right hepatic lobe is assessed due to the absence of IV contrast too small to accurately characterize. Recommend further evaluation with a liver ultrasound. Aortic Atherosclerosis (ICD10-I70.0). Electronically Signed   By: Lorenza Cambridge M.D.   On: 12/24/2022 11:45   CT HEAD WO CONTRAST  Result Date: 12/23/2022 CLINICAL DATA:  Neuro deficit, acute, stroke suspected; Neck trauma (Age >= 65y) EXAM: CT HEAD WITHOUT CONTRAST CT CERVICAL SPINE WITHOUT CONTRAST TECHNIQUE: Multidetector CT imaging of the head and cervical spine was performed following the standard protocol without intravenous contrast. Multiplanar CT image reconstructions of the cervical spine were also generated. RADIATION DOSE REDUCTION: This exam was performed according to the departmental dose-optimization program which includes automated exposure control, adjustment of the mA and/or kV according to patient size and/or use of iterative reconstruction technique. COMPARISON:  CT head 10/21/2022 FINDINGS: CT HEAD FINDINGS  Brain: Cerebral ventricle sizes are concordant with the degree of cerebral volume loss. Patchy and confluent areas of decreased attenuation are noted throughout the deep and periventricular white matter of the cerebral hemispheres bilaterally, compatible with chronic microvascular ischemic disease. No evidence of large-territorial acute infarction. No parenchymal hemorrhage. No mass lesion. No extra-axial collection. No mass effect or midline shift. No hydrocephalus. Basilar cisterns are patent. Vascular: No hyperdense vessel. Skull: No acute fracture or focal lesion. Sinuses/Orbits: Paranasal sinuses and mastoid air cells are clear. Redemonstration of right orbit surgical changes. Otherwise the orbits are unremarkable. Other: Similar retained metallic densities along the scalp. CT CERVICAL SPINE FINDINGS Alignment: Normal. Skull base and vertebrae: Markedly limited evaluation due to motion artifact. Multilevel degenerative changes. Question osseous neural foraminal stenosis. No severe osseous central canal stenosis. Soft tissues and spinal canal: No prevertebral fluid or swelling. No visible canal hematoma. Upper chest: Unremarkable. Other: None. IMPRESSION: 1. No acute intracranial abnormality. 2. Markedly limited evaluation of the spine for fracture  due to motion artifact. Recommend repeat Electronically Signed   By: Tish Frederickson M.D.   On: 12/23/2022 21:40   CT Cervical Spine Wo Contrast  Result Date: 12/23/2022 CLINICAL DATA:  Neuro deficit, acute, stroke suspected; Neck trauma (Age >= 65y) EXAM: CT HEAD WITHOUT CONTRAST CT CERVICAL SPINE WITHOUT CONTRAST TECHNIQUE: Multidetector CT imaging of the head and cervical spine was performed following the standard protocol without intravenous contrast. Multiplanar CT image reconstructions of the cervical spine were also generated. RADIATION DOSE REDUCTION: This exam was performed according to the departmental dose-optimization program which includes automated  exposure control, adjustment of the mA and/or kV according to patient size and/or use of iterative reconstruction technique. COMPARISON:  CT head 10/21/2022 FINDINGS: CT HEAD FINDINGS Brain: Cerebral ventricle sizes are concordant with the degree of cerebral volume loss. Patchy and confluent areas of decreased attenuation are noted throughout the deep and periventricular white matter of the cerebral hemispheres bilaterally, compatible with chronic microvascular ischemic disease. No evidence of large-territorial acute infarction. No parenchymal hemorrhage. No mass lesion. No extra-axial collection. No mass effect or midline shift. No hydrocephalus. Basilar cisterns are patent. Vascular: No hyperdense vessel. Skull: No acute fracture or focal lesion. Sinuses/Orbits: Paranasal sinuses and mastoid air cells are clear. Redemonstration of right orbit surgical changes. Otherwise the orbits are unremarkable. Other: Similar retained metallic densities along the scalp. CT CERVICAL SPINE FINDINGS Alignment: Normal. Skull base and vertebrae: Markedly limited evaluation due to motion artifact. Multilevel degenerative changes. Question osseous neural foraminal stenosis. No severe osseous central canal stenosis. Soft tissues and spinal canal: No prevertebral fluid or swelling. No visible canal hematoma. Upper chest: Unremarkable. Other: None. IMPRESSION: 1. No acute intracranial abnormality. 2. Markedly limited evaluation of the spine for fracture due to motion artifact. Recommend repeat Electronically Signed   By: Tish Frederickson M.D.   On: 12/23/2022 21:40   DG Chest 1 View  Result Date: 12/23/2022 CLINICAL DATA:  Weakness, fell EXAM: CHEST  1 VIEW COMPARISON:  10/18/2022 FINDINGS: Single frontal view of the chest demonstrates a stable enlarged cardiac silhouette. No acute airspace disease, effusion, or pneumothorax. No acute displaced fracture. IMPRESSION: 1. Stable chest, no acute process. Electronically Signed   By:  Sharlet Salina M.D.   On: 12/23/2022 20:41   DG Lumbar Spine Complete  Result Date: 12/23/2022 CLINICAL DATA:  Weakness, fell EXAM: LUMBAR SPINE - COMPLETE 4+ VIEW COMPARISON:  None Available. FINDINGS: Frontal, bilateral oblique, lateral views of the lumbar spine are obtained. There are 5 non-rib-bearing lumbar type vertebral bodies in grossly normal anatomic alignment. No acute displaced fracture. Multilevel lumbar spondylosis and facet hypertrophy greatest at L4-5 and L5-S1. Sacroiliac joints are normal. IMPRESSION: 1. Lower lumbar spondylosis and facet hypertrophy. No acute fracture. Electronically Signed   By: Sharlet Salina M.D.   On: 12/23/2022 20:40   DG Thoracic Spine 2 View  Result Date: 12/23/2022 CLINICAL DATA:  Weakness, fell EXAM: THORACIC SPINE 2 VIEWS COMPARISON:  None Available. FINDINGS: Frontal and lateral views of the thoracic spine are obtained. Alignment is anatomic. No acute fractures. Mild lower thoracic spondylosis. Paraspinal soft tissues are unremarkable. IMPRESSION: 1. No acute bony abnormality. Electronically Signed   By: Sharlet Salina M.D.   On: 12/23/2022 20:40   DG Tibia/Fibula Left  Result Date: 12/23/2022 CLINICAL DATA:  Weakness, fell, inability to bear weight EXAM: LEFT TIBIA AND FIBULA - 2 VIEW COMPARISON:  None Available. FINDINGS: Frontal and lateral views of the left tibia and fibula are obtained. Evidence of prior  healed distal left tibial diaphyseal fracture. No acute fracture, subluxation, or dislocation. Moderate 3 compartmental left knee osteoarthritis. Left ankle is unremarkable. Soft tissues are normal. IMPRESSION: 1. No acute displaced fracture. 2. Moderate left knee osteoarthritis. Electronically Signed   By: Sharlet Salina M.D.   On: 12/23/2022 20:39   DG Pelvis 1-2 Views  Result Date: 12/23/2022 CLINICAL DATA:  Weakness, fell, inability to bear weight on left side EXAM: PELVIS - 1-2 VIEW COMPARISON:  None Available. FINDINGS: Supine frontal view of  the pelvis was obtained. No evidence of fracture, subluxation, or dislocation within either hip. Mild symmetrical bilateral hip osteoarthritis. The remainder of the bony pelvis is unremarkable. Sacroiliac joints are normal. IMPRESSION: 1. No acute displaced fracture. 2. Symmetrical bilateral hip osteoarthritis. Electronically Signed   By: Sharlet Salina M.D.   On: 12/23/2022 20:38    Pending Labs Unresulted Labs (From admission, onward)     Start     Ordered   12/25/22 0500  Comprehensive metabolic panel  Tomorrow morning,   R        12/24/22 0857   12/25/22 0500  CBC  Tomorrow morning,   R        12/24/22 0857   12/25/22 0500  Magnesium  Tomorrow morning,   R        12/24/22 0857   12/25/22 0500  Phosphorus  Tomorrow morning,   R        12/24/22 0857   12/25/22 0500  Lipid panel  (Labs)  Tomorrow morning,   R       Comments: Fasting    12/24/22 1158   12/25/22 0500  Hemoglobin A1c  (Labs)  Tomorrow morning,   R       Comments: To assess prior glycemic control    12/24/22 1158            Vitals/Pain Today's Vitals   12/24/22 1000 12/24/22 1030 12/24/22 1100 12/24/22 1206  BP: 115/80 113/76 (!) 134/101   Pulse: 62 60 61   Resp: 17 17 17    Temp:    98.2 F (36.8 C)  TempSrc:    Oral  SpO2: 100% 100% 100%   Weight:      Height:      PainSc:        Isolation Precautions No active isolations  Medications Medications  sodium chloride 0.9 % bolus 1,000 mL (0 mLs Intravenous Stopped 12/23/22 2228)    Followed by  0.9 %  sodium chloride infusion (1,000 mLs Intravenous New Bag/Given 12/24/22 1478)  acetaminophen (TYLENOL) tablet 650 mg (has no administration in time range)    Or  acetaminophen (TYLENOL) suppository 650 mg (has no administration in time range)  ondansetron (ZOFRAN) tablet 4 mg (has no administration in time range)    Or  ondansetron (ZOFRAN) injection 4 mg (has no administration in time range)   stroke: early stages of recovery book (has no administration  in time range)    Mobility non-ambulatory     Focused Assessments    R Recommendations: See Admitting Provider Note  Report given to:   Additional Notes:

## 2022-12-24 NOTE — ED Notes (Signed)
ED TO INPATIENT HANDOFF REPORT  ED Nurse Name and Phone #: Ernst Breach Name/Age/Gender Tristan Gardner 88 y.o. male Room/Bed: 004C/004C  Code Status   Code Status: Prior  Home/SNF/Other Home Patient oriented to: self, place, time, and situation Is this baseline? Yes   Triage Complete: Triage complete  Chief Complaint Near syncope [R55]  Triage Note Patient was BIB EMS today from home. Over the last week patient has been having some issues with his balance with weakness involved. Today he is unable to bear any weight to left or right leg. He is having lower left side abdominal pain. Patient has had some falls. Patient has been experiencing low blood pressure today. Some right sided facial droop noted over the last week. Some ataxia noted earlier but is better now. Speech is clear now was very garbled when EMS first arrived on scene.    Allergies Allergies  Allergen Reactions   Iodine Hives, Itching and Rash         Shellfish Allergy Hives    Level of Care/Admitting Diagnosis ED Disposition     ED Disposition  Admit   Condition  --   Comment  Hospital Area: [facility] [[id number]]  Level of Care: Telemetry Medical [104]  May place patient in observation at [facility] or [facility] if equivalent level of care is available:: Yes  Covid Evaluation: Asymptomatic - no recent exposure (last 10 days) testing not required  Diagnosis: Near syncope [692301]  Admitting Physician: Frankey Shown [[id number]]  Attending Physician: Frankey Shown [[id number]]          B Medical/Surgery History Past Medical History:  Diagnosis Date   Atrial fibrillation (HCC)    Coronary artery disease    Hypertension    NSTEMI (non-ST elevated myocardial infarction) (HCC)    Stroke [facility])    Past Surgical History:  Procedure Laterality Date   BIOPSY  10/23/2022   Procedure: BIOPSY;  Surgeon: [name], MD;  Location: [facility] ENDOSCOPY;  Service:  Gastroenterology;;   CARDIAC SURGERY     COLONOSCOPY WITH PROPOFOL N/A 10/23/2022   Procedure: COLONOSCOPY WITH PROPOFOL;  Surgeon: [name], MD;  Location: [facility] ENDOSCOPY;  Service: Gastroenterology;  Laterality: N/A;   ESOPHAGOGASTRODUODENOSCOPY (EGD) WITH PROPOFOL N/A 10/23/2022   Procedure: ESOPHAGOGASTRODUODENOSCOPY (EGD) WITH PROPOFOL;  Surgeon: [name], MD;  Location: [facility] ENDOSCOPY;  Service: Gastroenterology;  Laterality: N/A;   ROTATOR CUFF REPAIR       A IV Location/Drains/Wounds Patient Lines/Drains/Airways Status     Active Line/Drains/Airways     Name Placement date Placement time Site Days   Peripheral IV 12/23/22 20 G Left;Posterior Hand 12/23/22  1911  Hand  1            Intake/Output Last 24 hours No intake or output data in the 24 hours ending 12/24/22 0123  Labs/Imaging Results for orders placed or performed during the hospital encounter of 12/23/22 (from the past 48 hour(s))  CBC     Status: Abnormal   Collection Time: 12/23/22  9:15 PM  Result Value Ref Range   WBC 4.6 4.0 - 10.5 K/uL   RBC 4.10 (L) 4.22 - 5.81 MIL/uL   Hemoglobin 10.3 (L) 13.0 - 17.0 g/dL   HCT 62.1 (L) 30.8 - 65.7 %   MCV 83.9 80.0 - 100.0 fL   MCH 25.1 (L) 26.0 - 34.0 pg   MCHC 29.9 (L) 30.0 - 36.0 g/dL   RDW 84.6 (H) 96.2 - 95.2 %  Platelets 203 150 - 400 K/uL    Comment: REPEATED TO VERIFY   nRBC 0.0 0.0 - 0.2 %    Comment: Performed at [facility], [address], [place], [place] [address]  Differential     Status: None   Collection Time: 12/23/22  9:15 PM  Result Value Ref Range   Neutrophils Relative % 63 %   Neutro Abs 2.9 1.7 - 7.7 K/uL   Lymphocytes Relative 23 %   Lymphs Abs 1.1 0.7 - 4.0 K/uL   Monocytes Relative 12 %   Monocytes Absolute 0.6 0.1 - 1.0 K/uL   Eosinophils Relative 2 %   Eosinophils Absolute 0.1 0.0 - 0.5 K/uL   Basophils Relative 0 %   Basophils Absolute 0.0 0.0 - 0.1 K/uL   Immature Granulocytes 0 %   Abs Immature  Granulocytes 0.01 0.00 - 0.07 K/uL    Comment: Performed at [facility], [address] [place], [place] [address]  Comprehensive metabolic panel     Status: Abnormal   Collection Time: 12/23/22  9:15 PM  Result Value Ref Range   Sodium 135 135 - 145 mmol/L   Potassium 4.4 3.5 - 5.1 mmol/L   Chloride 104 98 - 111 mmol/L   CO2 19 (L) 22 - 32 mmol/L   Glucose, Bld 97 70 - 99 mg/dL    Comment: Glucose reference range applies only to samples taken after fasting for at least 8 hours.   BUN 12 8 - 23 mg/dL   Creatinine, Ser 0.98 (H) 0.61 - 1.24 mg/dL   Calcium 8.8 (L) 8.9 - 10.3 mg/dL   Total Protein 7.6 6.5 - 8.1 g/dL   Albumin 3.1 (L) 3.5 - 5.0 g/dL   AST 31 15 - 41 U/L   ALT 24 0 - 44 U/L   Alkaline Phosphatase 84 38 - 126 U/L   Total Bilirubin 0.4 0.3 - 1.2 mg/dL   GFR, Estimated 54 (L) >60 mL/min    Comment: (NOTE) Calculated using the CKD-EPI Creatinine Equation (2021)    Anion gap 12 5 - 15    Comment: Performed at [facility], [address], [place], [place] [address]  Type and screen [facility]     Status: None   Collection Time: 12/23/22  9:15 PM  Result Value Ref Range   ABO/RH(D) O POS    Antibody Screen NEG    Sample Expiration      12/26/2022,2359 Performed at [facility], [address] [place], [place] [address]   Troponin I (High Sensitivity)     Status: Abnormal   Collection Time: 12/23/22  9:15 PM  Result Value Ref Range   Troponin I (High Sensitivity) 78 (H) <18 ng/L    Comment: (NOTE) Elevated high sensitivity troponin I (hsTnI) values and significant  changes across serial measurements may suggest ACS but many other  chronic and acute conditions are known to elevate hsTnI results.  Refer to the Links section for chest pain algorithms and additional  guidance. Performed at [facility], [address], [place], [place] [address]   Troponin I (High Sensitivity)     Status: Abnormal   Collection Time:  12/23/22 11:45 PM  Result Value Ref Range   Troponin I (High Sensitivity) 78 (H) <18 ng/L    Comment: (NOTE) Elevated high sensitivity troponin I (hsTnI) values and significant  changes across serial measurements may suggest ACS but many other  chronic  and acute conditions are known to elevate hsTnI results.  Refer to the "Links" section for chest pain algorithms and additional  guidance. Performed at [facility], [address], [place], [place] [address]   I-stat chem 8, ED     Status: Abnormal   Collection Time: 12/23/22 11:46 PM  Result Value Ref Range   Sodium 138 135 - 145 mmol/L   Potassium 4.4 3.5 - 5.1 mmol/L   Chloride 107 98 - 111 mmol/L   BUN 11 8 - 23 mg/dL   Creatinine, Ser 6.04 0.61 - 1.24 mg/dL   Glucose, Bld 540 (H) 70 - 99 mg/dL    Comment: Glucose reference range applies only to samples taken after fasting for at least 8 hours.   Calcium, Ion 1.05 (L) 1.15 - 1.40 mmol/L   TCO2 23 22 - 32 mmol/L   Hemoglobin 12.2 (L) 13.0 - 17.0 g/dL   HCT 98.1 (L) 19.1 - 47.8 %  Urinalysis, Routine w reflex microscopic -Urine, Clean Catch     Status: Abnormal   Collection Time: 12/23/22 11:54 PM  Result Value Ref Range   Color, Urine STRAW (A) YELLOW   APPearance CLEAR CLEAR   Specific Gravity, Urine 1.004 (L) 1.005 - 1.030   pH 6.0 5.0 - 8.0   Glucose, UA NEGATIVE NEGATIVE mg/dL   Hgb urine dipstick NEGATIVE NEGATIVE   Bilirubin Urine NEGATIVE NEGATIVE   Ketones, ur NEGATIVE NEGATIVE mg/dL   Protein, ur NEGATIVE NEGATIVE mg/dL   Nitrite NEGATIVE NEGATIVE   Leukocytes,Ua NEGATIVE NEGATIVE    Comment: Performed at [facility], [address], [place], [place] [address]   CT HEAD WO CONTRAST  Result Date: 12/23/2022 CLINICAL DATA:  Neuro deficit, acute, stroke suspected; Neck trauma (Age >= 65y) EXAM: CT HEAD WITHOUT CONTRAST CT CERVICAL SPINE WITHOUT CONTRAST TECHNIQUE: Multidetector CT imaging of the head and cervical spine was performed following the  standard protocol without intravenous contrast. Multiplanar CT image reconstructions of the cervical spine were also generated. RADIATION DOSE REDUCTION: This exam was performed according to the departmental dose-optimization program which includes automated exposure control, adjustment of the mA and/or kV according to patient size and/or use of iterative reconstruction technique. COMPARISON:  CT head 10/21/2022 FINDINGS: CT HEAD FINDINGS Brain: Cerebral ventricle sizes are concordant with the degree of cerebral volume loss. Patchy and confluent areas of decreased attenuation are noted throughout the deep and periventricular white matter of the cerebral hemispheres bilaterally, compatible with chronic microvascular ischemic disease. No evidence of large-territorial acute infarction. No parenchymal hemorrhage. No mass lesion. No extra-axial collection. No mass effect or midline shift. No hydrocephalus. Basilar cisterns are patent. Vascular: No hyperdense vessel. Skull: No acute fracture or focal lesion. Sinuses/Orbits: Paranasal sinuses and mastoid air cells are clear. Redemonstration of right orbit surgical changes. Otherwise the orbits are unremarkable. Other: Similar retained metallic densities along the scalp. CT CERVICAL SPINE FINDINGS Alignment: Normal. Skull base and vertebrae: Markedly limited evaluation due to motion artifact. Multilevel degenerative changes. Question osseous neural foraminal stenosis. No severe osseous central canal stenosis. Soft tissues and spinal canal: No prevertebral fluid or swelling. No visible canal hematoma. Upper chest: Unremarkable. Other: None. IMPRESSION: 1. No acute intracranial abnormality. 2. Markedly limited evaluation of the spine for fracture due to motion artifact. Recommend repeat Electronically Signed   By: Tish Frederickson M.D.   On: 12/23/2022 21:40   CT Cervical Spine Wo Contrast  Result Date: 12/23/2022 CLINICAL DATA:  Neuro deficit, acute, stroke suspected;  Neck trauma (Age >= 65y) EXAM: CT HEAD WITHOUT CONTRAST CT CERVICAL SPINE WITHOUT CONTRAST TECHNIQUE: Multidetector CT imaging of the head and cervical spine was performed following the standard protocol without intravenous contrast. Multiplanar CT image reconstructions of the cervical spine were also generated. RADIATION DOSE REDUCTION: This exam was performed according to the departmental dose-optimization program which includes automated exposure control, adjustment of the mA and/or kV according to patient size and/or use of iterative reconstruction technique. COMPARISON:  CT head 10/21/2022 FINDINGS: CT HEAD FINDINGS Brain: Cerebral ventricle sizes are concordant with the degree of cerebral volume loss. Patchy and confluent areas of decreased attenuation are noted throughout the deep and periventricular white matter of the cerebral hemispheres bilaterally, compatible with chronic microvascular ischemic disease. No evidence of large-territorial acute infarction. No parenchymal hemorrhage. No mass lesion. No extra-axial collection. No mass effect or midline shift. No hydrocephalus. Basilar cisterns are patent. Vascular: No hyperdense vessel. Skull: No acute fracture or focal lesion. Sinuses/Orbits: Paranasal sinuses and mastoid air cells are clear. Redemonstration of right orbit surgical changes. Otherwise the orbits are unremarkable. Other: Similar retained metallic densities along the scalp. CT CERVICAL SPINE FINDINGS Alignment: Normal. Skull base and vertebrae: Markedly limited evaluation due to motion artifact. Multilevel degenerative changes. Question osseous neural foraminal stenosis. No severe osseous central canal stenosis. Soft tissues and spinal canal: No prevertebral fluid or swelling. No visible canal hematoma. Upper chest: Unremarkable. Other: None. IMPRESSION: 1. No acute intracranial abnormality. 2. Markedly limited evaluation of the spine for fracture due to motion artifact. Recommend repeat  Electronically Signed   By: Tish Frederickson M.D.   On: 12/23/2022 21:40   DG Chest 1 View  Result Date: 12/23/2022 CLINICAL DATA:  Weakness, fell EXAM: CHEST  1 VIEW COMPARISON:  10/18/2022 FINDINGS: Single frontal view of the chest demonstrates a stable enlarged cardiac silhouette. No acute airspace disease, effusion, or pneumothorax. No acute displaced fracture. IMPRESSION: 1. Stable chest, no acute process. Electronically Signed   By: Sharlet Salina M.D.   On: 12/23/2022 20:41   DG Lumbar Spine Complete  Result Date: 12/23/2022 CLINICAL DATA:  Weakness, fell EXAM: LUMBAR SPINE - COMPLETE 4+ VIEW COMPARISON:  None Available. FINDINGS: Frontal, bilateral oblique, lateral views of the lumbar spine are obtained. There are 5 non-rib-bearing lumbar type vertebral bodies in grossly normal anatomic alignment. No acute displaced fracture. Multilevel lumbar spondylosis and facet hypertrophy greatest at L4-5 and L5-S1. Sacroiliac joints are normal. IMPRESSION: 1. Lower lumbar spondylosis and facet hypertrophy. No acute fracture. Electronically Signed   By: Sharlet Salina M.D.   On: 12/23/2022 20:40   DG Thoracic Spine 2 View  Result Date: 12/23/2022 CLINICAL DATA:  Weakness, fell EXAM: THORACIC SPINE 2 VIEWS COMPARISON:  None Available. FINDINGS: Frontal and lateral views of the thoracic spine are obtained. Alignment is anatomic. No acute fractures. Mild lower thoracic spondylosis. Paraspinal soft tissues are unremarkable. IMPRESSION: 1. No acute bony abnormality. Electronically Signed   By: Sharlet Salina M.D.   On: 12/23/2022 20:40   DG Tibia/Fibula Left  Result Date: 12/23/2022 CLINICAL DATA:  Weakness, fell, inability to bear weight EXAM: LEFT TIBIA AND FIBULA - 2 VIEW COMPARISON:  None Available. FINDINGS: Frontal and lateral views of the left tibia and fibula are obtained. Evidence of prior healed distal left tibial diaphyseal fracture. No acute fracture, subluxation, or dislocation. Moderate 3  compartmental left knee osteoarthritis. Left ankle is unremarkable. Soft tissues are normal. IMPRESSION: 1. No acute displaced fracture. 2. Moderate left knee osteoarthritis. Electronically Signed  By: Sharlet Salina M.D.   On: 12/23/2022 20:39   DG Pelvis 1-2 Views  Result Date: 12/23/2022 CLINICAL DATA:  Weakness, fell, inability to bear weight on left side EXAM: PELVIS - 1-2 VIEW COMPARISON:  None Available. FINDINGS: Supine frontal view of the pelvis was obtained. No evidence of fracture, subluxation, or dislocation within either hip. Mild symmetrical bilateral hip osteoarthritis. The remainder of the bony pelvis is unremarkable. Sacroiliac joints are normal. IMPRESSION: 1. No acute displaced fracture. 2. Symmetrical bilateral hip osteoarthritis. Electronically Signed   By: Sharlet Salina M.D.   On: 12/23/2022 20:38    Pending Labs Unresulted Labs (From admission, onward)    None       Vitals/Pain Today's Vitals   12/24/22 0015 12/24/22 0030 12/24/22 0100 12/24/22 0115  BP: (!) 159/98 (!) 141/87 (!) 154/92 (!) 159/86  Pulse: (!) 58 (!) 58 (!) 58 (!) 59  Resp:      Temp:      TempSrc:      SpO2: 100% 100% 100% 100%  Weight:      Height:      PainSc:        Isolation Precautions No active isolations  Medications Medications  sodium chloride 0.9 % bolus 1,000 mL (0 mLs Intravenous Stopped 12/23/22 2228)    Followed by  0.9 %  sodium chloride infusion (1,000 mLs Intravenous New Bag/Given 12/23/22 2230)    Mobility walks     Focused Assessments Neuro Assessment Handoff:  Swallow screen pass? Yes  Cardiac Rhythm: Sinus bradycardia       Neuro Assessment:   Neuro Checks:      Has TPA been given? No If patient is a Neuro Trauma and patient is going to OR before floor call report to 4N Charge nurse: [phone] or [phone]   R Recommendations: See Admitting Provider Note  Report given to:   Additional Notes: Family updated as to room assignment.

## 2022-12-24 NOTE — Progress Notes (Cosign Needed Addendum)
EEG complete - results pending 

## 2022-12-24 NOTE — ED Notes (Signed)
Attempted to call report to the floor. 

## 2022-12-24 NOTE — ED Notes (Signed)
Spoke to MD [facility] about discontinuing ER orders. MD to place admission orders.

## 2022-12-24 NOTE — ED Notes (Signed)
Family updated as to patient's room assignement

## 2022-12-24 NOTE — ED Notes (Signed)
Daughter Jerric Oyen [phone] would like an update and call back asap

## 2022-12-25 ENCOUNTER — Observation Stay (HOSPITAL_COMMUNITY): Payer: Medicare PPO

## 2022-12-25 DIAGNOSIS — Z87891 Personal history of nicotine dependence: Secondary | ICD-10-CM | POA: Diagnosis not present

## 2022-12-25 DIAGNOSIS — R519 Headache, unspecified: Secondary | ICD-10-CM | POA: Diagnosis present

## 2022-12-25 DIAGNOSIS — R292 Abnormal reflex: Secondary | ICD-10-CM | POA: Diagnosis present

## 2022-12-25 DIAGNOSIS — K769 Liver disease, unspecified: Secondary | ICD-10-CM | POA: Diagnosis present

## 2022-12-25 DIAGNOSIS — N179 Acute kidney failure, unspecified: Secondary | ICD-10-CM | POA: Diagnosis present

## 2022-12-25 DIAGNOSIS — G959 Disease of spinal cord, unspecified: Secondary | ICD-10-CM | POA: Diagnosis not present

## 2022-12-25 DIAGNOSIS — I483 Typical atrial flutter: Secondary | ICD-10-CM | POA: Diagnosis not present

## 2022-12-25 DIAGNOSIS — Z6834 Body mass index (BMI) 34.0-34.9, adult: Secondary | ICD-10-CM | POA: Diagnosis not present

## 2022-12-25 DIAGNOSIS — R55 Syncope and collapse: Secondary | ICD-10-CM | POA: Diagnosis not present

## 2022-12-25 DIAGNOSIS — R252 Cramp and spasm: Secondary | ICD-10-CM | POA: Diagnosis present

## 2022-12-25 DIAGNOSIS — K029 Dental caries, unspecified: Secondary | ICD-10-CM | POA: Diagnosis present

## 2022-12-25 DIAGNOSIS — I482 Chronic atrial fibrillation, unspecified: Secondary | ICD-10-CM | POA: Diagnosis present

## 2022-12-25 DIAGNOSIS — D649 Anemia, unspecified: Secondary | ICD-10-CM | POA: Diagnosis present

## 2022-12-25 DIAGNOSIS — R9341 Abnormal radiologic findings on diagnostic imaging of renal pelvis, ureter, or bladder: Secondary | ICD-10-CM | POA: Diagnosis present

## 2022-12-25 DIAGNOSIS — R0902 Hypoxemia: Secondary | ICD-10-CM | POA: Diagnosis not present

## 2022-12-25 DIAGNOSIS — R531 Weakness: Secondary | ICD-10-CM | POA: Diagnosis present

## 2022-12-25 DIAGNOSIS — I10 Essential (primary) hypertension: Secondary | ICD-10-CM | POA: Diagnosis present

## 2022-12-25 DIAGNOSIS — G629 Polyneuropathy, unspecified: Secondary | ICD-10-CM | POA: Diagnosis present

## 2022-12-25 DIAGNOSIS — R7303 Prediabetes: Secondary | ICD-10-CM | POA: Diagnosis present

## 2022-12-25 DIAGNOSIS — I4892 Unspecified atrial flutter: Secondary | ICD-10-CM | POA: Diagnosis present

## 2022-12-25 DIAGNOSIS — E871 Hypo-osmolality and hyponatremia: Secondary | ICD-10-CM | POA: Diagnosis not present

## 2022-12-25 DIAGNOSIS — R471 Dysarthria and anarthria: Secondary | ICD-10-CM | POA: Diagnosis present

## 2022-12-25 DIAGNOSIS — G8929 Other chronic pain: Secondary | ICD-10-CM | POA: Diagnosis present

## 2022-12-25 DIAGNOSIS — R001 Bradycardia, unspecified: Secondary | ICD-10-CM | POA: Diagnosis present

## 2022-12-25 DIAGNOSIS — E785 Hyperlipidemia, unspecified: Secondary | ICD-10-CM | POA: Diagnosis present

## 2022-12-25 DIAGNOSIS — K0381 Cracked tooth: Secondary | ICD-10-CM | POA: Diagnosis present

## 2022-12-25 DIAGNOSIS — M48061 Spinal stenosis, lumbar region without neurogenic claudication: Secondary | ICD-10-CM | POA: Diagnosis present

## 2022-12-25 DIAGNOSIS — Z8673 Personal history of transient ischemic attack (TIA), and cerebral infarction without residual deficits: Secondary | ICD-10-CM | POA: Diagnosis not present

## 2022-12-25 DIAGNOSIS — I2510 Atherosclerotic heart disease of native coronary artery without angina pectoris: Secondary | ICD-10-CM | POA: Diagnosis present

## 2022-12-25 DIAGNOSIS — R29818 Other symptoms and signs involving the nervous system: Secondary | ICD-10-CM | POA: Diagnosis not present

## 2022-12-25 DIAGNOSIS — M48062 Spinal stenosis, lumbar region with neurogenic claudication: Secondary | ICD-10-CM | POA: Diagnosis not present

## 2022-12-25 LAB — COMPREHENSIVE METABOLIC PANEL
ALT: 24 U/L (ref 0–44)
AST: 25 U/L (ref 15–41)
Albumin: 2.8 g/dL — ABNORMAL LOW (ref 3.5–5.0)
Alkaline Phosphatase: 77 U/L (ref 38–126)
Anion gap: 11 (ref 5–15)
BUN: 7 mg/dL — ABNORMAL LOW (ref 8–23)
CO2: 21 mmol/L — ABNORMAL LOW (ref 22–32)
Calcium: 8.8 mg/dL — ABNORMAL LOW (ref 8.9–10.3)
Chloride: 104 mmol/L (ref 98–111)
Creatinine, Ser: 0.9 mg/dL (ref 0.61–1.24)
GFR, Estimated: >60 mL/min (ref >60)
Glucose, Bld: 95 mg/dL (ref 70–99)
Potassium: 3.9 mmol/L (ref 3.5–5.1)
Sodium: 136 mmol/L (ref 135–145)
Total Bilirubin: 0.7 mg/dL (ref 0.3–1.2)
Total Protein: 7.4 g/dL (ref 6.5–8.1)

## 2022-12-25 LAB — CBC
HCT: 33.2 % — ABNORMAL LOW (ref 39.0–52.0)
Hemoglobin: 10.3 g/dL — ABNORMAL LOW (ref 13.0–17.0)
MCH: 25.9 pg — ABNORMAL LOW (ref 26.0–34.0)
MCHC: 31 g/dL (ref 30.0–36.0)
MCV: 83.6 fL (ref 80.0–100.0)
Platelets: 183 10*3/uL (ref 150–400)
RBC: 3.97 MIL/uL — ABNORMAL LOW (ref 4.22–5.81)
RDW: 22.2 % — ABNORMAL HIGH (ref 11.5–15.5)
WBC: 4 10*3/uL (ref 4.0–10.5)
nRBC: 0 % (ref 0.0–0.2)

## 2022-12-25 LAB — ECHOCARDIOGRAM COMPLETE
AR max vel: 2.14 cm2
AV Peak grad: 6.6 mmHg
Ao pk vel: 1.28 m/s
Area-P 1/2: 5.02 cm2
Height: 71 in
MV M vel: 5.22 m/s
MV Peak grad: 109 mmHg
S' Lateral: 2.9 cm
Weight: 3920 oz

## 2022-12-25 LAB — PHOSPHORUS: Phosphorus: 3.4 mg/dL (ref 2.5–4.6)

## 2022-12-25 LAB — LIPID PANEL
Cholesterol: 128 mg/dL (ref 0–200)
HDL: 57 mg/dL (ref >40)
LDL Cholesterol: 65 mg/dL (ref 0–99)
Total CHOL/HDL Ratio: 2.2 RATIO
Triglycerides: 29 mg/dL (ref <150)
VLDL: 6 mg/dL (ref 0–40)

## 2022-12-25 LAB — HEMOGLOBIN A1C
Hgb A1c MFr Bld: 6.1 % — ABNORMAL HIGH (ref 4.8–5.6)
Mean Plasma Glucose: 128.37 mg/dL

## 2022-12-25 LAB — MAGNESIUM: Magnesium: 1.6 mg/dL — ABNORMAL LOW (ref 1.7–2.4)

## 2022-12-25 MED ORDER — PREDNISONE 5 MG PO TABS: 10.0000 mg | ORAL_TABLET | Freq: Once | ORAL | Status: DC

## 2022-12-25 MED ORDER — PREDNISONE 5 MG PO TABS
50.0000 mg | ORAL_TABLET | Freq: Once | ORAL | Status: AC
  Administered 2022-12-26: 50 mg via ORAL
  Filled 2022-12-25: qty 2

## 2022-12-25 MED ORDER — MAGNESIUM SULFATE 4 GM/100ML IV SOLN
4.0000 g | Freq: Once | INTRAVENOUS | Status: AC
  Administered 2022-12-25: 4 g via INTRAVENOUS
  Filled 2022-12-25: qty 100

## 2022-12-25 MED ORDER — AMLODIPINE BESYLATE 10 MG PO TABS
10.0000 mg | ORAL_TABLET | Freq: Every day | ORAL | Status: DC
  Administered 2022-12-25 – 2022-12-29 (×4): 10 mg via ORAL
  Filled 2022-12-25 (×5): qty 1

## 2022-12-25 MED ORDER — PREDNISONE 5 MG PO TABS
50.0000 mg | ORAL_TABLET | Freq: Once | ORAL | Status: AC
  Administered 2022-12-25: 50 mg via ORAL
  Filled 2022-12-25: qty 2

## 2022-12-25 MED ORDER — DIPHENHYDRAMINE HCL 25 MG PO CAPS
50.0000 mg | ORAL_CAPSULE | Freq: Once | ORAL | Status: AC
  Administered 2022-12-26: 50 mg via ORAL
  Filled 2022-12-25: qty 2

## 2022-12-25 MED ORDER — ORAL CARE MOUTH RINSE: 15.0000 mL | OROMUCOSAL | Status: DC | PRN

## 2022-12-25 MED ORDER — HYDRALAZINE HCL 20 MG/ML IJ SOLN: 10.0000 mg | Freq: Four times a day (QID) | INTRAMUSCULAR | Status: DC | PRN

## 2022-12-25 MED ORDER — PREDNISONE 5 MG PO TABS: 50.0000 mg | ORAL_TABLET | Freq: Once | ORAL | Status: DC

## 2022-12-25 NOTE — Progress Notes (Signed)
Physical Therapy Treatment Patient Details Name: Tristan Gardner MRN: [id number] DOB: 06-05-1935 Today's Date: 12/25/2022   History of Present Illness The pt is an 88 yo male presenting 6/22 with inability to bear weight in L or R LE, frequent falls, R sided facial droop, and intermittent ataxia. CT showed no acute abnormality, pt unable to get MRI. PMH includes: afib, CAD, HTN, NSTEMI, CVA, sciatica, chronic pain, and obesity.    PT Comments    Pt seen with OT this date to work on standing ability and attempt ambulation. Pt with c/o L knee and ankle pain in weightbearing in addition to bilat LE numbness L worse than R. Worked on sit to stand, standing balance, and weight shifting. Pt with R lateral bias in standing and requires mod-maxAx2 to maintain upright standing in midline posture. Pt able to advance L LE forward backwards however unable to advance R LE due to inability to tolerate full WBing on L LE resulting in knee buckling. Pt to benefit from rehab program < 3 hrs a day. Acute PT to cont to follow.    Recommendations for follow up therapy are one component of a multi-disciplinary discharge planning process, led by the attending physician.  Recommendations may be updated based on patient status, additional functional criteria and insurance authorization.  Follow Up Recommendations  Can patient physically be transported by private vehicle: No    Assistance Recommended at Discharge Frequent or constant Supervision/Assistance  Patient can return home with the following A lot of help with walking and/or transfers;A little help with bathing/dressing/bathroom;Assistance with cooking/housework;Assist for transportation;Help with stairs or ramp for entrance   Equipment Recommendations  Rolling walker (2 wheels)    Recommendations for Other Services       Precautions / Restrictions Precautions Precautions: Fall Precaution Comments: pt has had ems come two times to assist with falls  recently. Restrictions Weight Bearing Restrictions: No     Mobility  Bed Mobility               General bed mobility comments: in chair on arrival    Transfers Overall transfer level: Needs assistance Equipment used: Rolling walker (2 wheels), None Transfers: Sit to/from Stand, Bed to chair/wheelchair/BSC Sit to Stand: Mod assist, +2 physical assistance           General transfer comment: Pt with difficulty weight shifting during transfers. Pt with some proprioceptive difficulties coming to stand and shifting weight forward for the stand without cues requiring modA anteriorly to pull on gait belt to bring hips over feet. completed 3 sit to stand, pt with increased wbing on R LE with noted R lateral bias. Pt with increased trunk flexion however when tactile cues given to extend at hips pt buckles at bilat knees    Ambulation/Gait               General Gait Details: pt unable at this time   Stairs             Wheelchair Mobility    Modified Rankin (Stroke Patients Only) Modified Rankin (Stroke Patients Only) Pre-Morbid Rankin Score: Moderately severe disability Modified Rankin: Severe disability     Balance Overall balance assessment: Needs assistance Sitting-balance support: No upper extremity supported, Feet supported Sitting balance-Leahy Scale: Fair     Standing balance support: Bilateral upper extremity supported, Reliant on assistive device for balance Standing balance-Leahy Scale: Poor Standing balance comment: must have significant outside support to remain standing.  Cognition Arousal/Alertness: Awake/alert Behavior During Therapy: WFL for tasks assessed/performed Overall Cognitive Status: No family/caregiver present to determine baseline cognitive functioning                                 General Comments: pt very HOH which I believe contributes to his delayed processing. Pt does  appear to have difficulty describing his symptoms to neuro MD.        Exercises      General Comments General comments (skin integrity, edema, etc.): Pt with BP of 170s/80s t/o session. HR increased to 135bpm while standing at sink      Pertinent Vitals/Pain Pain Assessment Pain Assessment: Faces Faces Pain Scale: Hurts even more Pain Location: LLE Pain Descriptors / Indicators: Aching, Cramping, Discomfort Pain Intervention(s): Limited activity within patient's tolerance    Home Living Family/patient expects to be discharged to:: Private residence Living Arrangements: Children Available Help at Discharge: Family;Available PRN/intermittently Type of Home: Apartment Home Access: Level entry       Home Layout: One level Home Equipment: Rollator (4 wheels);Shower seat Additional Comments: lives with granddaughter who works at day care so he is alone during day    Prior Function            PT Goals (current goals can now be found in the care plan section) Acute Rehab PT Goals Patient Stated Goal: return to independence and strength prior to return home PT Goal Formulation: With patient Time For Goal Achievement: 01/07/23 Potential to Achieve Goals: Good Progress towards PT goals: Progressing toward goals    Frequency    Min 4X/week      PT Plan Current plan remains appropriate    Co-evaluation PT/OT/SLP Co-Evaluation/Treatment: Yes Reason for Co-Treatment: Complexity of the patient's impairments (multi-system involvement) PT goals addressed during session: Mobility/safety with mobility        AM-PAC PT "6 Clicks" Mobility   Outcome Measure  Help needed turning from your back to your side while in a flat bed without using bedrails?: A Little Help needed moving from lying on your back to sitting on the side of a flat bed without using bedrails?: A Little Help needed moving to and from a bed to a chair (including a wheelchair)?: A Lot Help needed standing  up from a chair using your arms (e.g., wheelchair or bedside chair)?: A Lot Help needed to walk in hospital room?: Total Help needed climbing 3-5 steps with a railing? : Total 6 Click Score: 12    End of Session Equipment Utilized During Treatment: Gait belt Activity Tolerance: Patient tolerated treatment well;Patient limited by fatigue Patient left: in chair;with call bell/phone within reach;with chair alarm set Nurse Communication: Mobility status PT Visit Diagnosis: Unsteadiness on feet (R26.81);Other abnormalities of gait and mobility (R26.89);Muscle weakness (generalized) (M62.81)     Time: 3295-1884 PT Time Calculation (min) (ACUTE ONLY): 39 min  Charges:  $Gait Training: 8-22 mins $Therapeutic Activity: 8-22 mins                     Lewis Shock, PT, DPT Acute Rehabilitation Services Secure chat preferred Office #: [phone]    Iona Hansen 12/25/2022, 11:30 AM

## 2022-12-25 NOTE — Evaluation (Signed)
Speech Language Pathology Evaluation Patient Details Name: Tristan Gardner MRN: [id number] DOB: 25-Apr-1935 Today's Date: 12/25/2022 Time: 8469-6295 SLP Time Calculation (min) (ACUTE ONLY): 13 min  Problem List:  Patient Active Problem List   Diagnosis Date Noted   Near syncope 12/24/2022   Neurological deficit, transient 12/24/2022   Essential hypertension 12/24/2022   History of CVA (cerebrovascular accident) 12/24/2022   Peripheral neuropathy 12/24/2022   Acute gastric ulcer with hemorrhage 10/23/2022   Gastrointestinal hemorrhage 10/23/2022   Iron deficiency anemia 10/19/2022   Heme positive stool 10/19/2022   Symptomatic anemia 10/18/2022   Chest pain, rule out acute myocardial infarction 08/06/2020   Elevated blood pressure reading 08/06/2020   H/O iron deficiency anemia 08/06/2020   Atrial flutter (HCC) 05/18/2020   Viral URI with cough 05/18/2020   Atypical chest pain 05/17/2020   Chronic atrial fibrillation with RVR (HCC) 03/11/2020   Past Medical History:  Past Medical History:  Diagnosis Date   Atrial fibrillation (HCC)    Coronary artery disease    Hypertension    NSTEMI (non-ST elevated myocardial infarction) (HCC)    Stroke [facility])    Past Surgical History:  Past Surgical History:  Procedure Laterality Date   BIOPSY  10/23/2022   Procedure: BIOPSY;  Surgeon: [name], MD;  Location: [facility] ENDOSCOPY;  Service: Gastroenterology;;   CARDIAC SURGERY     COLONOSCOPY WITH PROPOFOL N/A 10/23/2022   Procedure: COLONOSCOPY WITH PROPOFOL;  Surgeon: [name], MD;  Location: [facility] ENDOSCOPY;  Service: Gastroenterology;  Laterality: N/A;   ESOPHAGOGASTRODUODENOSCOPY (EGD) WITH PROPOFOL N/A 10/23/2022   Procedure: ESOPHAGOGASTRODUODENOSCOPY (EGD) WITH PROPOFOL;  Surgeon: [name], MD;  Location: [facility] ENDOSCOPY;  Service: Gastroenterology;  Laterality: N/A;   ROTATOR CUFF REPAIR     HPI:  The pt is an 88 yo male presenting 6/22 with inability to bear weight  in L or R LE, frequent falls, R sided facial droop, and intermittent ataxia. CT showed no acute abnormality, pt unable to get MRI. PMH includes: afib, CAD, HTN, NSTEMI, CVA, sciatica, chronic pain, and obesity.   Assessment / Plan / Recommendation Clinical Impression  Pt presents with fluent, clear speech.  Expressive and receptive language are WNL; he is able to follow commands, yes/no reliability is accurate; no deficits in naming.  Disorientation to elements of time and impaired retrieval of verbal information noted. Baseline cognition unknown.  Pt is HOH.  He is conversant and has good pragmatic function.  No acute care SLP f/u is recommended; he may benefit from cognitive assessment at next level of care.  Will need supervision  per OT/PT recs.    SLP Assessment  SLP Recommendation/Assessment: All further Speech Lanaguage Pathology  needs can be addressed in the next venue of care SLP Visit Diagnosis: Cognitive communication deficit (R41.841)    Recommendations for follow up therapy are one component of a multi-disciplinary discharge planning process, led by the attending physician.  Recommendations may be updated based on patient status, additional functional criteria and insurance authorization.    Follow Up Recommendations  No SLP follow up    Assistance Recommended at Discharge  Frequent or constant Supervision/Assistance  Functional Status Assessment Patient has had a recent decline in their functional status and demonstrates the ability to make significant improvements in function in a reasonable and predictable amount of time.  Frequency and Duration           SLP Evaluation Cognition  Overall Cognitive Status: No family/caregiver present to determine baseline cognitive functioning Arousal/Alertness: Awake/alert Orientation  Level: Oriented to person;Oriented to place;Disoriented to time Attention: Selective Selective Attention: Appears intact Memory: Impaired Memory  Impairment: Retrieval deficit Awareness: Impaired Awareness Impairment: Intellectual impairment Problem Solving: Appears intact       Comprehension  Auditory Comprehension Overall Auditory Comprehension: Appears within functional limits for tasks assessed Yes/No Questions: Within Functional Limits Commands: Within Functional Limits Reading Comprehension Reading Status: Not tested    Expression Expression Primary Mode of Expression: Verbal Verbal Expression Overall Verbal Expression: Appears within functional limits for tasks assessed Written Expression Dominant Hand: Right   Oral / Motor  Oral Motor/Sensory Function Overall Oral Motor/Sensory Function: Within functional limits Motor Speech Overall Motor Speech: Appears within functional limits for tasks assessed            Blenda Mounts Laurice 12/25/2022, 2:18 PM Marchelle Folks L. Samson Frederic, MA CCC/SLP Clinical Specialist - Acute Care SLP Acute Rehabilitation Services Office number [phone]

## 2022-12-25 NOTE — NC FL2 (Signed)
[place] MEDICAID [place]2 LEVEL OF CARE FORM     IDENTIFICATION  Patient Name: Tristan Gardner Birthdate: Dec 20, 1934 Sex: male Admission Date (Current Location): 12/23/2022  [facility] and [place] Number:  Producer, television/film/video and Address:  [facility], [address] [place], [place] [address]      Provider Number: [id number]  Attending Physician Name and Address:  Leroy Sea, MD  Relative Name and Phone Number:       Current Level of Care: Hospital Recommended Level of Care: Skilled Nursing Facility Prior Approval Number:    Date Approved/Denied:   PASRR Number: [id number]  Discharge Plan: SNF    Current Diagnoses: Patient Active Problem List   Diagnosis Date Noted   Near syncope 12/24/2022   Neurological deficit, transient 12/24/2022   Essential hypertension 12/24/2022   History of CVA (cerebrovascular accident) 12/24/2022   Peripheral neuropathy 12/24/2022   Acute gastric ulcer with hemorrhage 10/23/2022   Gastrointestinal hemorrhage 10/23/2022   Iron deficiency anemia 10/19/2022   Heme positive stool 10/19/2022   Symptomatic anemia 10/18/2022   Chest pain, rule out acute myocardial infarction 08/06/2020   Elevated blood pressure reading 08/06/2020   H/O iron deficiency anemia 08/06/2020   Atrial flutter (HCC) 05/18/2020   Viral URI with cough 05/18/2020   Atypical chest pain 05/17/2020   Chronic atrial fibrillation with RVR (HCC) 03/11/2020    Orientation RESPIRATION BLADDER Height & Weight     Self, Time, Situation, Place  Normal Continent Weight: 245 lb (111.1 kg) Height:  5\' 11"  (180.3 cm)  BEHAVIORAL SYMPTOMS/MOOD NEUROLOGICAL BOWEL NUTRITION STATUS      Continent Diet (See dc summary)  AMBULATORY STATUS COMMUNICATION OF NEEDS Skin   Extensive Assist Verbally Normal                       Personal Care Assistance Level of Assistance  Bathing, Feeding, Dressing Bathing Assistance: Maximum assistance Feeding  assistance: Independent Dressing Assistance: Limited assistance     Functional Limitations Info             SPECIAL CARE FACTORS FREQUENCY  PT (By licensed PT), OT (By licensed OT)     PT Frequency: 5x/week OT Frequency: 5x/week            Contractures Contractures Info: Not present    Additional Factors Info  Code Status, Allergies Code Status Info: Full Allergies Info: Iodine, Shellfish Allergy           Current Medications (12/25/2022):  This is the current hospital active medication list Current Facility-Administered Medications  Medication Dose Route Frequency Provider Last Rate Last Admin   0.9 %  sodium chloride infusion  1,000 mL Intravenous Continuous Adefeso, Oladapo, DO 125 mL/hr at 12/25/22 1136 1,000 mL at 12/25/22 1136   acetaminophen (TYLENOL) tablet 650 mg  650 mg Oral Q6H PRN Adefeso, Oladapo, DO   650 mg at 12/24/22 1609   Or   acetaminophen (TYLENOL) suppository 650 mg  650 mg Rectal Q6H PRN Adefeso, Oladapo, DO       albuterol (PROVENTIL) (2.5 MG/3ML) 0.083% nebulizer solution 2.5 mg  2.5 mg Nebulization Q4H PRN Adefeso, Oladapo, DO       amLODipine (NORVASC) tablet 10 mg  10 mg Oral Daily Susa Raring K, MD   10 mg at 12/25/22 1135   atorvastatin (LIPITOR) tablet 40 mg  40 mg Oral QHS Adefeso, Oladapo, DO   40 mg at 12/24/22 2142   baclofen (LIORESAL)  tablet 5 mg  5 mg Oral Once PRN Gordy Councilman, MD       [START ON 12/26/2022] diphenhydrAMINE (BENADRYL) capsule 50 mg  50 mg Oral Once Alwyn Ren R, NP       gabapentin (NEURONTIN) capsule 300 mg  300 mg Oral TID Adefeso, Oladapo, DO   300 mg at 12/25/22 0805   hydrALAZINE (APRESOLINE) injection 10 mg  10 mg Intravenous Q6H PRN Leroy Sea, MD       magnesium sulfate IVPB 4 g 100 mL  4 g Intravenous Once Leroy Sea, MD 50 mL/hr at 12/25/22 1138 4 g at 12/25/22 1138   metoprolol succinate (TOPROL-XL) 24 hr tablet 50 mg  50 mg Oral Daily Adefeso, Oladapo, DO   50 mg at 12/25/22  0805   ondansetron (ZOFRAN) tablet 4 mg  4 mg Oral Q6H PRN Adefeso, Oladapo, DO       Or   ondansetron (ZOFRAN) injection 4 mg  4 mg Intravenous Q6H PRN Adefeso, Oladapo, DO       Oral care mouth rinse  15 mL Mouth Rinse PRN Barnetta Chapel, MD       Melene Muller ON 12/26/2022] predniSONE (DELTASONE) tablet 50 mg  50 mg Oral Once Covington, Jamie R, NP       [START ON 12/26/2022] predniSONE (DELTASONE) tablet 50 mg  50 mg Oral Once Covington, Jamie R, NP       predniSONE (DELTASONE) tablet 50 mg  50 mg Oral Once Mickie Kay, NP         Discharge Medications: Please see discharge summary for a list of discharge medications.  Relevant Imaging Results:  Relevant Lab Results:   Additional Information SSN: [id number] [place], [place]

## 2022-12-25 NOTE — Evaluation (Signed)
Clinical/Bedside Swallow Evaluation Patient Details  Name: Tristan Gardner MRN: [id number] Date of Birth: 1935/04/25  Today's Date: 12/25/2022 Time: SLP Start Time (ACUTE ONLY): 1330 SLP Stop Time (ACUTE ONLY): 1341 SLP Time Calculation (min) (ACUTE ONLY): 11 min  Past Medical History:  Past Medical History:  Diagnosis Date   Atrial fibrillation (HCC)    Coronary artery disease    Hypertension    NSTEMI (non-ST elevated myocardial infarction) (HCC)    Stroke [facility])    Past Surgical History:  Past Surgical History:  Procedure Laterality Date   BIOPSY  10/23/2022   Procedure: BIOPSY;  Surgeon: [name], MD;  Location[facility] ENDOSCOPY;  Service: Gastroenterology;;   CARDIAC SURGERY     COLONOSCOPY WITH PROPOFOL N/A 10/23/2022   Procedure: COLONOSCOPY WITH PROPOFOL;  Surgeon: [name], MD;  Location: [facility] ENDOSCOPY;  Service: Gastroenterology;  Laterality: N/A;   ESOPHAGOGASTRODUODENOSCOPY (EGD) WITH PROPOFOL N/A 10/23/2022   Procedure: ESOPHAGOGASTRODUODENOSCOPY (EGD) WITH PROPOFOL;  Surgeon: [name], MD;  Location: [facility] ENDOSCOPY;  Service: Gastroenterology;  Laterality: N/A;   ROTATOR CUFF REPAIR     HPI:  The pt is an 88 yo male presenting 6/22 with inability to bear weight in L or R LE, frequent falls, R sided facial droop, and intermittent ataxia. CT showed no acute abnormality, pt unable to get MRI. PMH includes: afib, CAD, HTN, NSTEMI, CVA, sciatica, chronic pain, and obesity.    Assessment / Plan / Recommendation  Clinical Impression  Pt presents with normal oropharyngeal swallowing. Oral mechanism exam unremarkable. No focal deficits. Demonstrated thorough mastication, the appearance of a brisk swallow response, no s/s of aspiration. No dysphagia. Recommend regular diet, thin liquids; meds whole in liquid. No SLP f/u needed. SLP Visit Diagnosis: Dysphagia, unspecified (R13.10)    Aspiration Risk  No limitations    Diet Recommendation   Regular solids, thin  liquids  Medication Administration: Whole meds with liquid    Other  Recommendations Oral Care Recommendations: Oral care BID    Recommendations for follow up therapy are one component of a multi-disciplinary discharge planning process, led by the attending physician.  Recommendations may be updated based on patient status, additional functional criteria and insurance authorization.  Follow up Recommendations No SLP follow up        Swallow Study   General Date of Onset: 12/24/22 HPI: The pt is an 88 yo male presenting 6/22 with inability to bear weight in L or R LE, frequent falls, R sided facial droop, and intermittent ataxia. CT showed no acute abnormality, pt unable to get MRI. PMH includes: afib, CAD, HTN, NSTEMI, CVA, sciatica, chronic pain, and obesity. Type of Study: Bedside Swallow Evaluation Previous Swallow Assessment: no Diet Prior to this Study: Regular;Thin liquids (Level 0) Temperature Spikes Noted: No Respiratory Status: Room air History of Recent Intubation: No Behavior/Cognition: Alert;Cooperative;Pleasant mood Oral Cavity Assessment: Within Functional Limits Oral Care Completed by SLP: No Oral Cavity - Dentition: Adequate natural dentition Vision: Functional for self-feeding Self-Feeding Abilities: Able to feed self Patient Positioning: Upright in bed Baseline Vocal Quality: Normal Volitional Cough: Strong Volitional Swallow: Able to elicit    Oral/Motor/Sensory Function Overall Oral Motor/Sensory Function: Within functional limits   Ice Chips Ice chips: Within functional limits   Thin Liquid Thin Liquid: Within functional limits    Nectar Thick Nectar Thick Liquid: Not tested   Honey Thick Honey Thick Liquid: Not tested   Puree Puree: Within functional limits   Solid     Solid: Within functional limits  Blenda Mounts Laurice 12/25/2022,2:11 PM Marchelle Folks L. Samson Frederic, MA CCC/SLP Clinical Specialist - Acute Care SLP Acute Rehabilitation  Services Office number [phone]

## 2022-12-25 NOTE — TOC Initial Note (Signed)
Transition of Care [facility] - Initial/Assessment Note    Patient Details  Name: Tristan Gardner MRN: [id number] Date of Birth: 13-Dec-1934  Transition of Care [facility] CM/SW Contact:    Mearl Latin, LCSW Phone Number: 12/25/2022, 12:53 PM  Clinical Narrative:                 CSW received consult for possible SNF placement at time of discharge. CSW spoke with patient. Patient reported that he stays with his granddaughter but she works. Patient expressed understanding of PT recommendation and is agreeable to SNF placement at time of discharge. Patient reports preference for [facility]. CSW discussed insurance authorization process and will provide Medicare SNF ratings list. CSW will send out referrals for review and provide bed offers as available.   Skilled Nursing Rehab Facilities-   [email or web address]   Ratings out of 5 stars (5 the highest)   Name Address  Phone # Quality Care Staffing Health Inspection Overall  [facility] [address] [phone] 2 1 5 4   [facility] [address], [place] [phone] 4 1 3 2   [facility] [address], [place] [phone] 2 1 1 1   [facility] [address], [place] [phone] 4 1 3 2   [facility]  [address], [place] [phone] 3 2 5 5   [facility] [address] [facility] [phone] 2 1 2 1   [facility] [address] [place] [phone] 4 1 2 1   [facility] [address], [place] [phone] 2 4 3 3   [address] ([facility]) [address], [place] [phone] 3 2 2 2   [facility] [address] [place], [place] [phone] 1 2 1 1   [facility] [address], [place] [phone] 3 1 1 1   Eligha Bridegroom [address] Liliane [place] [phone] 4 3 4 4   [facility] [address], [place] [phone] 3 4 3 3           [facility] [address], [place] [phone]      [facility], [place] [address], [place] [phone] 1 1 2 1   [facility] [address] [employer] [phone] 2 2 4 4   [facility] [address] [phone] 2 1 4 3   [facility] [address] [place] [phone] 3 3 3 3           [address] (no [facility]) [address], [place] [phone] 4 4 5 5   [facility] (No [employer]) [address] [place] [phone] 2 2 4 4   [facility] [address] [place] [phone] 2 1 2 1   [facility] (No [employer]) [address], [place] [phone] 5 5 5 5   [facility] [address], [facility] [phone] 2 3 5 5   [facility] [address] [place] [phone] 2 1 1 1   [place] [address] Liliane Shi [phone] 5 2 5 5   [facility]  [address], [place] [phone] 2 2 2 2   [facility] [address] [place] [phone] 4 2 1 1   [facility] [address] [place], [place] [phone] 2 2 3 3           [facility] [address], [place] [phone] 1 1 1 1   [facility] [address], Evlyn Clines  [phone] 2 3 3 3   [facility] (No [employer]) [address], [place] [phone] 2 1 3  2  [facility]) [address], [place] [phone] 1 1 1 1   [facility] [address] [place] [phone] 3 2 5 5   [facility] [address], [place] [phone] 2 1 1 1           [facility] [address] [place], [place] [phone] 4 4 5 5   [facility])  [address], [place] [phone] 2 1 2 1   [facility]) [address], [place] [phone]  1 4 3   [facility] [address] [place] [phone] 3 5 4 5   [address] [place], [place] [phone] 3 2 2 2   [facility]) [address] [place] [phone]  2 1 3 2      Expected Discharge Plan: Skilled Nursing Facility Barriers to Discharge: Continued Medical Work up, English as a second language teacher, SNF Pending bed offer   Patient Goals and CMS Choice Patient states their goals for this hospitalization and ongoing recovery are:: Rehab CMS Medicare.gov Compare Post Acute Care list provided to:: Patient Choice offered to / list presented to : Patient [place] ownership interest in [facility].provided to:: Patient    Expected Discharge Plan and Services In-house Referral: Clinical Social Work   Post Acute Care Choice: Skilled Nursing Facility Living arrangements for the past 2 months: Single Family Home                                      Prior Living Arrangements/Services Living arrangements for the past 2 months: Single Family Home Lives with:: Other (Comment) (Granddaughter) Patient language and need for interpreter reviewed:: Yes Do you feel safe going back to the place where you live?: Yes      Need for Family Participation in Patient Care: Yes (Comment) Care giver support system in place?: Yes (comment) Current home services: DME (rollator/ cane/ shower seat) Criminal Activity/Legal Involvement Pertinent to Current Situation/Hospitalization: No - Comment as needed  Activities of Daily Living      Permission Sought/Granted Permission sought to share information with : Facility Medical sales representative, Family Supports Permission granted to share information with : Yes, Verbal Permission Granted     Permission granted to share info w AGENCY: SNFs  Permission granted to share info w Relationship: Daughter     Emotional Assessment Appearance:: Appears stated age Attitude/Demeanor/Rapport: Engaged, Gracious Affect (typically observed): Accepting, Appropriate, Pleasant Orientation: : Oriented to Self, Oriented to Place, Oriented to  Time, Oriented to Situation Alcohol / Substance Use: Not Applicable Psych  Involvement: No (comment)  Admission diagnosis:  Weakness [R53.1] Near syncope [R55] Neurological deficit, transient [R29.818] Patient Active Problem List   Diagnosis Date Noted   Near syncope 12/24/2022   Neurological deficit, transient 12/24/2022   Essential hypertension 12/24/2022   History of CVA (cerebrovascular accident) 12/24/2022   Peripheral neuropathy 12/24/2022   Acute gastric ulcer with hemorrhage 10/23/2022   Gastrointestinal hemorrhage 10/23/2022   Iron deficiency anemia 10/19/2022   Heme positive stool 10/19/2022   Symptomatic anemia 10/18/2022   Chest pain, rule out acute myocardial infarction 08/06/2020   Elevated blood pressure reading 08/06/2020   H/O iron deficiency anemia 08/06/2020   Atrial flutter (HCC) 05/18/2020   Viral URI with cough 05/18/2020   Atypical chest pain 05/17/2020   Chronic atrial fibrillation with RVR (HCC) 03/11/2020   PCP:  Porfirio Oar, PA Pharmacy:  [id number] - [place] - [address] [place] [address] Phone: [phone] Fax: [phone]  [facility] [address] [place] [address] Phone: [phone] Fax: [phone]     Social Determinants of Health (SDOH) Social History: SDOH Screenings   Tobacco Use: Medium Risk (12/23/2022)   SDOH Interventions:     Readmission Risk Interventions     No data to display

## 2022-12-25 NOTE — Evaluation (Signed)
Occupational Therapy Evaluation Patient Details Name: Tristan Gardner MRN: [id number] DOB: [birth year] Today's Date: 12/25/2022   History of Present Illness The pt is an 88 yo male presenting 6/22 with inability to bear weight in L or R LE, frequent falls, R sided facial droop, and intermittent ataxia. CT showed no acute abnormality, pt unable to get MRI. PMH includes: afib, CAD, HTN, NSTEMI, CVA, sciatica, chronic pain, and obesity.   Clinical Impression   Pt admitted with the above diagnosis and has the deficits outlined below. Pt would benefit from cont OT to increase independence with basic adls and adl transfers so pt can eventually d/c home with his granddaughter. Pt was previously needing some help with adls but was alone during day while pt granddaughter worked.  Pt is not safe alone at  this time due to significant LLE pain and weakness as well as proprioceptive difficulties.  Pt will need continued post acute rehab less than 3 hours a day prior to returning home due to his deficits. Will continue to see with focus on adls in standing and functional transfers.      Recommendations for follow up therapy are one component of a multi-disciplinary discharge planning process, led by the attending physician.  Recommendations may be updated based on patient status, additional functional criteria and insurance authorization.   Assistance Recommended at Discharge Frequent or constant Supervision/Assistance  Patient can return home with the following Two people to help with walking and/or transfers;Two people to help with bathing/dressing/bathroom;Assistance with cooking/housework;Direct supervision/assist for medications management;Direct supervision/assist for financial management;Assist for transportation    Functional Status Assessment  Patient has had a recent decline in their functional status and demonstrates the ability to make significant improvements in function in a reasonable and  predictable amount of time.  Equipment Recommendations  Other (comment) (tbd)    Recommendations for Other Services       Precautions / Restrictions Precautions Precautions: Fall Precaution Comments: pt has had ems come two times to assist with falls recently. Restrictions Weight Bearing Restrictions: No      Mobility Bed Mobility               General bed mobility comments: in chair on arrival    Transfers Overall transfer level: Needs assistance Equipment used: Rolling walker (2 wheels), None Transfers: Sit to/from Stand, Bed to chair/wheelchair/BSC Sit to Stand: Mod assist, +2 physical assistance Stand pivot transfers: +2 physical assistance, Max assist         General transfer comment: Pt with difficulty weight shifting during transfers. Pt with some proprioceptive difficulties coming to stand and shifting weight forward for the stand without cues      Balance Overall balance assessment: Needs assistance Sitting-balance support: No upper extremity supported, Feet supported Sitting balance-Leahy Scale: Fair     Standing balance support: Bilateral upper extremity supported, Reliant on assistive device for balance Standing balance-Leahy Scale: Poor Standing balance comment: must have significant outside support to remain standing.                           ADL either performed or assessed with clinical judgement   ADL Overall ADL's : Needs assistance/impaired Eating/Feeding: Set up;Sitting   Grooming: Wash/dry face;Oral care;Maximal assistance;Standing Grooming Details (indicate cue type and reason): Pt required assist to remain standing at sink. pt with heavy R lean due to pain in LLE. Pt did not require assist with acutal grooming tasks. Upper Body Bathing: Set up;Sitting  Lower Body Bathing: Maximal assistance;Sit to/from stand;+2 for physical assistance Lower Body Bathing Details (indicate cue type and reason): +2 when standing. One to  stand with pt and second person to assist with bathing of bottom and peri area Upper Body Dressing : Minimal assistance;Sitting   Lower Body Dressing: Maximal assistance;Sit to/from stand;Cueing for compensatory techniques   Toilet Transfer: +2 for physical assistance;Maximal assistance;Stand-pivot;Rolling walker (2 wheels) Toilet Transfer Details (indicate cue type and reason): Pt has difficulty putting weight through LLE to move the RLE therefore knees buckle during transfers Toileting- Clothing Manipulation and Hygiene: Maximal assistance;+2 for physical assistance;Sit to/from stand       Functional mobility during ADLs: Moderate assistance;+2 for physical assistance;Rolling walker (2 wheels) General ADL Comments: Pt limited with all adls in standing due to weakness in LLE and pain in L knee and ankle with weight bearing.     Vision Baseline Vision/History: 1 Wears glasses Ability to See in Adequate Light: 0 Adequate Patient Visual Report: No change from baseline Vision Assessment?: No apparent visual deficits     Perception     Praxis      Pertinent Vitals/Pain Pain Assessment Pain Assessment: Faces Faces Pain Scale: Hurts even more Pain Location: LLE Pain Descriptors / Indicators: Aching, Cramping, Discomfort Pain Intervention(s): Limited activity within patient's tolerance, Monitored during session, Repositioned     Hand Dominance Right   Extremity/Trunk Assessment Upper Extremity Assessment Upper Extremity Assessment: Overall WFL for tasks assessed   Lower Extremity Assessment Lower Extremity Assessment: Defer to PT evaluation   Cervical / Trunk Assessment Cervical / Trunk Assessment: Kyphotic;Other exceptions Cervical / Trunk Exceptions: pt with pain in L back/flank area. Unsure if this is from fall?   Communication Communication Communication: HOH   Cognition Arousal/Alertness: Awake/alert Behavior During Therapy: WFL for tasks assessed/performed Overall  Cognitive Status: No family/caregiver present to determine baseline cognitive functioning                                 General Comments: Pt stated it was Feb 2024. Pt oriented to place and situation. pt recalls 911 as emergency number. Does not know address but states he is new to this apartment and if  he needed ems he would call daughter who would then call ems.     General Comments  Pt limited by LLE deficits and pain in standing.  Pt also with significant cognitive deficts and inconsistencies in explaining what his deficits are and have been. Unsure of pt's baseline cognitive status.    Exercises     Shoulder Instructions      Home Living Family/patient expects to be discharged to:: Private residence Living Arrangements: Children Available Help at Discharge: Family;Available PRN/intermittently Type of Home: Apartment Home Access: Level entry     Home Layout: One level     Bathroom Shower/Tub: Chief Strategy Officer: Handicapped height     Home Equipment: Rollator (4 wheels);Shower seat   Additional Comments: lives with granddaughter who works at day care so he is alone during day      Prior Functioning/Environment Prior Level of Function : Needs assist             Mobility Comments: Utilizes a rollator for mobility in his home and outside, utilizing scooters available in the community for community distances. Reports 2 recent falls ADLs Comments: pt reports daughter works at daycare and is not home during day, states he  has difficulty in bathroom. pt sponge bathes        OT Problem List: Decreased strength;Decreased range of motion;Impaired balance (sitting and/or standing);Decreased cognition;Decreased knowledge of use of DME or AE;Decreased knowledge of precautions;Impaired sensation;Obesity;Pain      OT Treatment/Interventions: Self-care/ADL training;DME and/or AE instruction;Balance training;Therapeutic activities    OT  Goals(Current goals can be found in the care plan section) Acute Rehab OT Goals Patient Stated Goal: to get my left leg working better OT Goal Formulation: With patient Time For Goal Achievement: 01/08/23 Potential to Achieve Goals: Fair ADL Goals Pt Will Perform Grooming: with min assist;standing Pt Will Perform Lower Body Bathing: with min assist;sit to/from stand Pt Will Perform Lower Body Dressing: sit to/from stand;with mod assist Pt Will Transfer to Toilet: with min assist;bedside commode Pt Will Perform Toileting - Clothing Manipulation and hygiene: with min assist;sit to/from stand  OT Frequency: Min 2X/week    Co-evaluation              AM-PAC OT "6 Clicks" Daily Activity     Outcome Measure Help from another person eating meals?: None Help from another person taking care of personal grooming?: None Help from another person toileting, which includes using toliet, bedpan, or urinal?: A Lot Help from another person bathing (including washing, rinsing, drying)?: A Lot Help from another person to put on and taking off regular upper body clothing?: A Little Help from another person to put on and taking off regular lower body clothing?: A Lot 6 Click Score: 17   End of Session Equipment Utilized During Treatment: Rolling walker (2 wheels) Nurse Communication: Mobility status  Activity Tolerance: Patient tolerated treatment well Patient left: in chair;with call bell/phone within reach;with chair alarm set  OT Visit Diagnosis: Unsteadiness on feet (R26.81);Other abnormalities of gait and mobility (R26.89);Repeated falls (R29.6);Pain;Other symptoms and signs involving the nervous system (R29.898) Pain - Right/Left: Left Pain - part of body: Leg                Time: 2025-4270 OT Time Calculation (min): 44 min Charges:  OT General Charges $OT Visit: 1 Visit OT Evaluation $OT Eval Moderate Complexity: 1 Mod  Hope Budds 12/25/2022, 9:33 AM
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
## 2022-12-25 NOTE — Progress Notes (Signed)
Echocardiogram 2D Echocardiogram has been performed.  Lucendia Herrlich 12/25/2022, 12:52 PM
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
## 2022-12-25 NOTE — Progress Notes (Signed)
Patient scheduled for CT myelogram 12/26/22 at 10 am. Contrast allergy pre-medication regimen has been ordered. Patient will receive 50 mg benadryl at 9 pm, 3 am and 9 am. He will also receive 50 mg benadryl at 9 am.   Primary team/neurology made aware via Epic Chat. Neurology team has also been requested to order the CT image study needed.   Please call IR with any questions.   Alwyn Ren, [place] [phone] 12/25/2022, 10:43 AM

## 2022-12-25 NOTE — Procedures (Signed)
Patient Name: Tristan Gardner  MRN: [id number]  Epilepsy Attending: Charlsie Quest  Referring Physician/Provider: Marjorie Smolder, NP  Date: 12/24/2022 Duration: 22.28 mins  Patient history: 88 year old patient with history of A-fib on Xarelto, CAD, sciatica, MI and stroke presents after 2 falls with left lower extremity weakness, spasms of left greater than right lower extremity and a transient episode of garbled speech and disorientation. Patient is unable to obtain MRI due to retained metal in head. EEG to evaluate for seizure.   Level of alertness: Awake  AEDs during EEG study: GBP  Technical aspects: This EEG study was done with scalp electrodes positioned according to the 10-20 International system of electrode placement. Electrical activity was reviewed with band pass filter of 1-70Hz , sensitivity of 7 uV/mm, display speed of 23mm/sec with a 60Hz  notched filter applied as appropriate. EEG data were recorded continuously and digitally stored.  Video monitoring was available and reviewed as appropriate.  Description: The posterior dominant rhythm consists of 9 Hz activity of moderate voltage (25-35 uV) seen predominantly in posterior head regions, symmetric and reactive to eye opening and eye closing. Physiologic photic driving was not seen during photic stimulation.  Hyperventilation was not performed.     During photic stimulation patient was noted to have subtle twitching in bilateral lower extremities. Concomitant eeg before, during and after the event didn't show any eeg change to suggest seizure.   IMPRESSION: This study is within normal limits. No seizures or epileptiform discharges were seen throughout the recording.  During photic stimulation patient was noted to have subtle twitching in bilateral lower extremities without concomitant eeg change, This was not an epileptic event.  A normal interictal EEG does not exclude the diagnosis of epilepsy.  Butch Otterson Annabelle Harman

## 2022-12-26 ENCOUNTER — Inpatient Hospital Stay (HOSPITAL_COMMUNITY): Payer: Medicare PPO

## 2022-12-26 DIAGNOSIS — G959 Disease of spinal cord, unspecified: Secondary | ICD-10-CM | POA: Diagnosis not present

## 2022-12-26 DIAGNOSIS — R29818 Other symptoms and signs involving the nervous system: Secondary | ICD-10-CM | POA: Diagnosis not present

## 2022-12-26 LAB — BASIC METABOLIC PANEL
Anion gap: 14 (ref 5–15)
BUN: 8 mg/dL (ref 8–23)
CO2: 18 mmol/L — ABNORMAL LOW (ref 22–32)
Calcium: 8.9 mg/dL (ref 8.9–10.3)
Chloride: 103 mmol/L (ref 98–111)
Creatinine, Ser: 0.91 mg/dL (ref 0.61–1.24)
GFR, Estimated: >60 mL/min (ref >60)
Glucose, Bld: 128 mg/dL — ABNORMAL HIGH (ref 70–99)
Potassium: 4.3 mmol/L (ref 3.5–5.1)
Sodium: 135 mmol/L (ref 135–145)

## 2022-12-26 LAB — MAGNESIUM: Magnesium: 2 mg/dL (ref 1.7–2.4)

## 2022-12-26 MED ORDER — HYDRALAZINE HCL 50 MG PO TABS
50.0000 mg | ORAL_TABLET | Freq: Three times a day (TID) | ORAL | Status: DC
  Administered 2022-12-26 – 2023-01-06 (×21): 50 mg via ORAL
  Filled 2022-12-26 (×27): qty 1

## 2022-12-26 MED ORDER — BACLOFEN 10 MG PO TABS
10.0000 mg | ORAL_TABLET | Freq: Three times a day (TID) | ORAL | Status: DC
  Administered 2022-12-26 – 2023-01-06 (×33): 10 mg via ORAL
  Filled 2022-12-26 (×33): qty 1

## 2022-12-26 MED ORDER — IOHEXOL 300 MG/ML  SOLN
10.0000 mL | Freq: Once | INTRAMUSCULAR | Status: AC | PRN
  Administered 2022-12-27: 10 mL via INTRATHECAL

## 2022-12-26 MED ORDER — DIPHENHYDRAMINE HCL 25 MG PO CAPS
50.0000 mg | ORAL_CAPSULE | Freq: Once | ORAL | Status: AC
  Administered 2022-12-27: 50 mg via ORAL
  Filled 2022-12-26: qty 2

## 2022-12-26 MED ORDER — PREDNISONE 5 MG PO TABS
50.0000 mg | ORAL_TABLET | Freq: Once | ORAL | Status: AC
  Administered 2022-12-27: 50 mg via ORAL
  Filled 2022-12-26: qty 2

## 2022-12-26 MED ORDER — LIDOCAINE HCL (PF) 1 % IJ SOLN
5.0000 mL | Freq: Once | INTRAMUSCULAR | Status: AC
  Administered 2022-12-27: 5 mL

## 2022-12-26 MED ORDER — PREDNISONE 5 MG PO TABS
50.0000 mg | ORAL_TABLET | Freq: Once | ORAL | Status: AC
  Administered 2022-12-26: 50 mg via ORAL
  Filled 2022-12-26: qty 2

## 2022-12-26 NOTE — Progress Notes (Signed)
PT Cancellation Note  Patient Details Name: Tristan Gardner MRN: [id number] DOB: 08-04-1934   Cancelled Treatment:    Reason Eval/Treat Not Completed: Patient at procedure or test/unavailable. Pt off floor undergoing myelogram. Acute PT to return as able to progress mobility.  Lewis Shock, PT, DPT Acute Rehabilitation Services Secure chat preferred Office #: [phone]    Tristan Gardner 12/26/2022, 9:57 AM

## 2022-12-26 NOTE — Progress Notes (Signed)
Patient was brought to fluoroscopy suite for ordered myelogram. Upon placing patient into the prone position, he became tachycardic with HR in the 130s, tachypneic, and hypoxic with O2 saturations 68-69%. Patient also experienced multiple leg spasms at that time, causing motion of his hips and lower back. Patient was quickly returned to a supine position where his oxygenation improved to 93% and heart rate decreased to in the 90s. Patient was returned to his room, no myelogram was attempted today due to patient becoming unstable in prone position.  Kennieth Francois, PA-C 12/26/2022

## 2022-12-26 NOTE — Progress Notes (Signed)
PROGRESS NOTE                                                                                                                                                                                                             Patient Demographics:    Tristan Gardner, is a 88 y.o. male, DOB - 1934/10/13, WGN:[id number]  Outpatient Primary MD for the patient is Porfirio Oar, [place]    LOS - 1  Admit date - 12/23/2022    Chief Complaint  Patient presents with   Hypotension   Fall   Abdominal Pain       Brief Narrative (HPI from H&P)   88 y.o. male with medical history significant of chronic atrial fibrillation on Xarelto, known CAD with a prior history of an NSTEMI, remote CVA on Plavix who presents to the emergency department from home via EMS due to fall and generalized weakness.  After detailed interview with patient today it is clear that he has been having low back pain with lower extremity weakness left more than right and excruciating left lower extremity pain which has been present for the last 1 year and now gradually worsening, he has sustained a few falls at home another 1 on the day of admission.  There was also question of some garbled speech but this appears to have resolved.  He was seen by neurology and admitted for further workup of his left lower extremity weakness and pain.   Subjective:   Patient in bed, appears comfortable, denies any headache, no fever, no chest pain or pressure, no shortness of breath , no abdominal pain. No new focal weakness, back pain radiating to his left lower extremity positive.   Assessment  & Plan :   Ongoing lower back pain, bilateral lower extremity weakness left more than right, left lower extremity pain.  He seems to have history suggestive of L-spine disease with radiculopathy, CT of the T and L-spine inconclusive, due for myelogram on 12/26/22, anticoagulation and Plavix held, neurology  following, PT OT and supportive care.  Questionable dysarthria at the time of admission.  Resolved.  CT head unremarkable cannot do MRI due to short wounds and metal in his skull, defer further management to neurology although management unlikely to change as he is already on anticoagulation and Plavix.  7 mm hypodense lesion of the inferior  aspect of the right hepatic lobe - outpatient right upper quadrant ultrasound by PCP.  Bladder wall thickening.  UA stable.  Monitor bladder scans.  Chronic A-fib .  [place] vas 2 score of greater than 5.  On Xarelto and statin, Xarelto held for myelogram, continue statin for secondary prevention.  Continue beta-blocker, continue supportive care.  History of peripheral neuropathy.  Continue Neurontin.  Hypertension.  On beta-blocker, added Norvasc & PO Hydralazine for better control along with as needed IV hydralazine, monitor.  Hypomagnesemia.  Replaced.      Condition - Extremely Guarded  Family Communication  : Daughter updated in detail on 12/25/2022  Code Status : Full code  Consults  : Neurology  PUD Prophylaxis :    Procedures  :     EEG.  Non Acute  CT head C-spine.  Non Acute  CT T&L-spine - 1. Within the limitations of a motion degraded exam, no evidence of acute fracture or traumatic subluxation of the thoracic or lumbar spine. 2. Incidentally noted circumferential bladder wall thickening, which is nonspecific, but can be seen in the setting of cystitis or chronic bladder outlet obstruction. Correlate with urinalysis. 3. Trace right pleural effusion. 4. Aortic atherosclerosis. 5. 7 mm hypodense lesion of the inferior aspect of the right hepatic lobe is assessed due to the absence of IV contrast too small to accurately characterize. Recommend further evaluation with a liver ultrasound. Aortic Atherosclerosis      Disposition Plan  :    Status is: Observation  DVT Prophylaxis  :    SCDs Start: 12/24/22 0856 SCDs Start: 12/24/22  0153    Lab Results  Component Value Date   PLT 183 12/25/2022    Diet :  Diet Order             Diet NPO time specified  Diet effective midnight                    Inpatient Medications  Scheduled Meds:  amLODipine  10 mg Oral Daily   atorvastatin  40 mg Oral QHS   diphenhydrAMINE  50 mg Oral Once   gabapentin  300 mg Oral TID   metoprolol succinate  50 mg Oral Daily   predniSONE  50 mg Oral Once   Continuous Infusions:  sodium chloride 125 mL/hr at 12/25/22 1506   PRN Meds:.acetaminophen **OR** acetaminophen, albuterol, baclofen, hydrALAZINE, ondansetron **OR** ondansetron (ZOFRAN) IV, mouth rinse  Antibiotics  :    Anti-infectives (From admission, onward)    None         Objective:   Vitals:   12/25/22 1600 12/25/22 2000 12/25/22 2337 12/26/22 0331  BP: 131/69 (!) 163/88 (!) 145/63 (!) 153/96  Pulse: (!) 58 60 60 70  Resp: 17 20 20 17   Temp: 98.3 F (36.8 C) 98.2 F (36.8 C) 98.2 F (36.8 C) 98.1 F (36.7 C)  TempSrc: Oral Oral Oral Oral  SpO2: 98%  98% 96%  Weight:      Height:        Wt Readings from Last 3 Encounters:  12/23/22 111.1 kg  10/23/22 122.5 kg  02/16/21 122.5 kg     Intake/Output Summary (Last 24 hours) at 12/26/2022 0758 Last data filed at 12/26/2022 1610 Gross per 24 hour  Intake 1436.76 ml  Output 4100 ml  Net -2663.24 ml     Physical Exam  Awake Alert, No new F.N deficits, left lower extremity SLR sign positive, .AT,PERRAL Supple Neck, No JVD,  Symmetrical Chest wall movement, Good air movement bilaterally, CTAB RRR,No Gallops,Rubs or new Murmurs,  +ve B.Sounds, Abd Soft, No tenderness,   No Cyanosis, Clubbing or edema       Data Review:    Recent Labs  Lab 12/23/22 2115 12/23/22 2346 12/24/22 0239 12/25/22 0452  WBC 4.6  --  4.8 4.0  HGB 10.3* 12.2* 10.5* 10.3*  HCT 34.4* 36.0* 32.9* 33.2*  PLT 203  --  171 183  MCV 83.9  --  82.0 83.6  MCH 25.1*  --  26.2 25.9*  MCHC 29.9*  --  31.9 31.0   RDW 22.6*  --  22.5* 22.2*  LYMPHSABS 1.1  --   --   --   MONOABS 0.6  --   --   --   EOSABS 0.1  --   --   --   BASOSABS 0.0  --   --   --     Recent Labs  Lab 12/23/22 2115 12/23/22 2346 12/24/22 0239 12/25/22 0452 12/26/22 0317  NA 135 138 137 136 135  K 4.4 4.4 4.4 3.9 4.3  CL 104 107 102 104 103  CO2 19*  --  20* 21* 18*  ANIONGAP 12  --  15 11 14   GLUCOSE 97 104* 127* 95 128*  BUN 12 11 10  7* 8  CREATININE 1.30* 1.10 1.11 0.90 0.91  AST 31  --  34 25  --   ALT 24  --  23 24  --   ALKPHOS 84  --  69 77  --   BILITOT 0.4  --  0.6 0.7  --   ALBUMIN 3.1*  --  3.0* 2.8*  --   HGBA1C  --   --   --  6.1*  --   MG  --   --  1.9 1.6* 2.0  CALCIUM 8.8*  --  8.7* 8.8* 8.9      Recent Labs  Lab 12/23/22 2115 12/24/22 0239 12/25/22 0452 12/26/22 0317  HGBA1C  --   --  6.1*  --   MG  --  1.9 1.6* 2.0  CALCIUM 8.8* 8.7* 8.8* 8.9     Lab Results  Component Value Date   CHOL 128 12/25/2022   HDL 57 12/25/2022   LDLCALC 65 12/25/2022   TRIG 29 12/25/2022   CHOLHDL 2.2 12/25/2022    Lab Results  Component Value Date   HGBA1C 6.1 (H) 12/25/2022      Radiology Reports ECHOCARDIOGRAM COMPLETE  Result Date: 12/25/2022    ECHOCARDIOGRAM REPORT   Patient Name:   Tristan Gardner Date of Exam: 12/25/2022 Medical Rec #:  [id number]     Height:       71.0 in Accession #:    [id number]    Weight:       245.0 lb Date of Birth:  23-Dec-1934    BSA:          2.298 m Patient Age:    87 years      BP:           170/89 mmHg Patient Gender: M             HR:           58 bpm. Exam Location:  Inpatient Procedure: 2D Echo, Cardiac Doppler and Color Doppler Indications:    Syncope R55  History:        Patient has prior history of Echocardiogram examinations, most  recent 03/12/2020. Stroke, Arrythmias:Atrial Fibrillation and                 Atrial Flutter, Signs/Symptoms:Syncope and Chest Pain; Risk                 Factors:Hypertension.  Sonographer:    Lucendia Herrlich  Referring Phys: [id number] OLADAPO ADEFESO IMPRESSIONS  1. Left ventricular ejection fraction, by estimation, is 50 to 55%. The left ventricle has low normal function. The left ventricle has no regional wall motion abnormalities. There is moderate concentric left ventricular hypertrophy. Left ventricular diastolic function could not be evaluated.  2. Right ventricular systolic function was not well visualized. The right ventricular size is not well visualized. There is normal pulmonary artery systolic pressure.  3. Left atrial size was mild to moderately dilated.  4. The mitral valve is normal in structure. Mild mitral valve regurgitation. No evidence of mitral stenosis.  5. The aortic valve is tricuspid. There is mild calcification of the aortic valve. There is mild thickening of the aortic valve. Aortic valve regurgitation is not visualized.  6. Aortic dilatation noted. There is mild dilatation of the ascending aorta, measuring 40 mm.  7. The inferior vena cava is normal in size with greater than 50% respiratory variability, suggesting right atrial pressure of 3 mmHg. Comparison(s): No significant change from prior study. FINDINGS  Left Ventricle: Left ventricular ejection fraction, by estimation, is 50 to 55%. The left ventricle has low normal function. The left ventricle has no regional wall motion abnormalities. The left ventricular internal cavity size was normal in size. There is moderate concentric left ventricular hypertrophy. Left ventricular diastolic function could not be evaluated due to atrial fibrillation. Left ventricular diastolic function could not be evaluated. Right Ventricle: The right ventricular size is not well visualized. Right vetricular wall thickness was not well visualized. Right ventricular systolic function was not well visualized. There is normal pulmonary artery systolic pressure. The tricuspid regurgitant velocity is 2.82 m/s, and with an assumed right atrial pressure of 3 mmHg, the  estimated right ventricular systolic pressure is 34.8 mmHg. Left Atrium: Left atrial size was mild to moderately dilated. Right Atrium: Right atrial size was not well visualized. Pericardium: There is no evidence of pericardial effusion. Mitral Valve: The mitral valve is normal in structure. Mild mitral valve regurgitation. No evidence of mitral valve stenosis. Tricuspid Valve: The tricuspid valve is normal in structure. Tricuspid valve regurgitation is mild . No evidence of tricuspid stenosis. Aortic Valve: The aortic valve is tricuspid. There is mild calcification of the aortic valve. There is mild thickening of the aortic valve. Aortic valve regurgitation is not visualized. Aortic valve peak gradient measures 6.6 mmHg. Pulmonic Valve: The pulmonic valve was not well visualized. Pulmonic valve regurgitation is mild. No evidence of pulmonic stenosis. Aorta: Aortic dilatation noted. There is mild dilatation of the ascending aorta, measuring 40 mm. Venous: The inferior vena cava is normal in size with greater than 50% respiratory variability, suggesting right atrial pressure of 3 mmHg. IAS/Shunts: The interatrial septum was not well visualized.  LEFT VENTRICLE PLAX 2D LVIDd:         4.00 cm   Diastology LVIDs:         2.90 cm   LV e' medial:    7.53 cm/s LV PW:         1.50 cm   LV E/e' medial:  10.5 LV IVS:        1.80 cm   LV e' lateral:  9.55 cm/s LVOT diam:     2.20 cm   LV E/e' lateral: 8.3 LV SV:         56 LV SV Index:   24 LVOT Area:     3.80 cm  RIGHT VENTRICLE             IVC RV S prime:     10.90 cm/s  IVC diam: 1.70 cm TAPSE (M-mode): 1.6 cm LEFT ATRIUM             Index LA diam:        4.90 cm 2.13 cm/m LA Vol (A2C):   69.2 ml 30.11 ml/m LA Vol (A4C):   73.9 ml 32.15 ml/m LA Biplane Vol: 73.4 ml 31.93 ml/m  AORTIC VALVE                 PULMONIC VALVE AV Area (Vmax): 2.14 cm     PR End Diast Vel: 5.29 msec AV Vmax:        128.00 cm/s AV Peak Grad:   6.6 mmHg LVOT Vmax:      71.92 cm/s LVOT Vmean:      44.750 cm/s LVOT VTI:       0.146 m  AORTA Ao Root diam: 3.70 cm Ao Asc diam:  4.00 cm MITRAL VALVE               TRICUSPID VALVE MV Area (PHT): 5.02 cm    TR Peak grad:   31.8 mmHg MV Decel Time: 151 msec    TR Vmax:        282.00 cm/s MR Peak grad: 109.0 mmHg MR Vmax:      522.00 cm/s  SHUNTS MV E velocity: 79.30 cm/s  Systemic VTI:  0.15 m MV A velocity: 54.00 cm/s  Systemic Diam: 2.20 cm MV E/A ratio:  1.47 Jodelle Red MD Electronically signed by Jodelle Red MD Signature Date/Time: 12/25/2022/4:49:14 PM    Final    EEG adult  Result Date: 12/24/2022 Charlsie Quest, MD     12/25/2022  8:52 AM Patient Name: Tristan Gardner MRN: [id number] Epilepsy Attending: Charlsie Quest Referring Physician/Provider: Marjorie Smolder, NP Date: 12/24/2022 Duration: 22.28 mins Patient history: 88 year old patient with history of A-fib on Xarelto, CAD, sciatica, MI and stroke presents after 2 falls with left lower extremity weakness, spasms of left greater than right lower extremity and a transient episode of garbled speech and disorientation. Patient is unable to obtain MRI due to retained metal in head. EEG to evaluate for seizure. Level of alertness: Awake AEDs during EEG study: GBP Technical aspects: This EEG study was done with scalp electrodes positioned according to the 10-20 International system of electrode placement. Electrical activity was reviewed with band pass filter of 1-70Hz , sensitivity of 7 uV/mm, display speed of 75mm/sec with a 60Hz  notched filter applied as appropriate. EEG data were recorded continuously and digitally stored.  Video monitoring was available and reviewed as appropriate. Description: The posterior dominant rhythm consists of 9 Hz activity of moderate voltage (25-35 uV) seen predominantly in posterior head regions, symmetric and reactive to eye opening and eye closing. Physiologic photic driving was not seen during photic stimulation.  Hyperventilation was not  performed.   During photic stimulation patient was noted to have subtle twitching in bilateral lower extremities. Concomitant eeg before, during and after the event didn't show any eeg change to suggest seizure. IMPRESSION: This study is within normal limits. No seizures or epileptiform discharges were seen  throughout the recording. During photic stimulation patient was noted to have subtle twitching in bilateral lower extremities without concomitant eeg change, This was not an epileptic event. A normal interictal EEG does not exclude the diagnosis of epilepsy. Priyanka Annabelle Harman   CT LUMBAR SPINE WO CONTRAST  Result Date: 12/24/2022 CLINICAL DATA:  Spinal cord injury, follow up EXAM: CT  THORACIC AND LUMBAR SPINE WITHOUT CONTRAST TECHNIQUE: Multidetector CT imaging of the thoracic and lumbar spine was performed without intravenous contrast. Multiplanar CT image reconstructions were also generated. RADIATION DOSE REDUCTION: This exam was performed according to the departmental dose-optimization program which includes automated exposure control, adjustment of the mA and/or kV according to patient size and/or use of iterative reconstruction technique. COMPARISON:  Lumbar spine radiograph 12/23/22 FINDINGS: CT THORACIC SPINE FINDINGS Alignment: Normal. Vertebrae: No acute fracture or focal pathologic process. Paraspinal and other soft tissues: Trace right pleural effusion. Moderate coronary artery calcifications. Hypodense lesion of the upper pole of the right kidney favored to represent a simple renal cyst. Aortic atherosclerotic calcifications. 7 mm hypodense lesion of the inferior aspect of the right hepatic lobe is assessed due to the absence of IV contrast too small to accurately characterize. Disc levels: Evidence of high-grade spinal canal stenosis. CT LUMBAR SPINE FINDINGS Limitations: Assessment lumbar spine is slightly limited due to motion artifact, particularly in the lower lumbar spine within this  limitation Segmentation:  5 lumbar type vertebral bodies. Alignment: Normal. Vertebrae: Within the limitations of a motion degraded exam, no evidence of an acute fracture. Vertebral body heights are preserved. Paraspinal and other soft tissues: Incidentally noted circumferential bladder wall thickening, which is nonspecific, but can be seen in the setting of cystitis or chronic bladder outlet obstruction. Correlate with urinalysis. Prostate is enlarged. Disc levels: There may be moderate spinal canal narrowing at L2-L3 and moderate to severe spinal canal narrowing at L4-L5. IMPRESSION: 1. Within the limitations of a motion degraded exam, no evidence of acute fracture or traumatic subluxation of the thoracic or lumbar spine. 2. Incidentally noted circumferential bladder wall thickening, which is nonspecific, but can be seen in the setting of cystitis or chronic bladder outlet obstruction. Correlate with urinalysis. 3. Trace right pleural effusion. 4. Aortic atherosclerosis. 5. 7 mm hypodense lesion of the inferior aspect of the right hepatic lobe is assessed due to the absence of IV contrast too small to accurately characterize. Recommend further evaluation with a liver ultrasound. Aortic Atherosclerosis (ICD10-I70.0). Electronically Signed   By: Lorenza Cambridge M.D.   On: 12/24/2022 11:45   CT THORACIC SPINE WO CONTRAST  Result Date: 12/24/2022 CLINICAL DATA:  Spinal cord injury, follow up EXAM: CT  THORACIC AND LUMBAR SPINE WITHOUT CONTRAST TECHNIQUE: Multidetector CT imaging of the thoracic and lumbar spine was performed without intravenous contrast. Multiplanar CT image reconstructions were also generated. RADIATION DOSE REDUCTION: This exam was performed according to the departmental dose-optimization program which includes automated exposure control, adjustment of the mA and/or kV according to patient size and/or use of iterative reconstruction technique. COMPARISON:  Lumbar spine radiograph 12/23/22 FINDINGS:  CT THORACIC SPINE FINDINGS Alignment: Normal. Vertebrae: No acute fracture or focal pathologic process. Paraspinal and other soft tissues: Trace right pleural effusion. Moderate coronary artery calcifications. Hypodense lesion of the upper pole of the right kidney favored to represent a simple renal cyst. Aortic atherosclerotic calcifications. 7 mm hypodense lesion of the inferior aspect of the right hepatic lobe is assessed due to the absence of IV contrast too small to accurately characterize. Disc  levels: Evidence of high-grade spinal canal stenosis. CT LUMBAR SPINE FINDINGS Limitations: Assessment lumbar spine is slightly limited due to motion artifact, particularly in the lower lumbar spine within this limitation Segmentation:  5 lumbar type vertebral bodies. Alignment: Normal. Vertebrae: Within the limitations of a motion degraded exam, no evidence of an acute fracture. Vertebral body heights are preserved. Paraspinal and other soft tissues: Incidentally noted circumferential bladder wall thickening, which is nonspecific, but can be seen in the setting of cystitis or chronic bladder outlet obstruction. Correlate with urinalysis. Prostate is enlarged. Disc levels: There may be moderate spinal canal narrowing at L2-L3 and moderate to severe spinal canal narrowing at L4-L5. IMPRESSION: 1. Within the limitations of a motion degraded exam, no evidence of acute fracture or traumatic subluxation of the thoracic or lumbar spine. 2. Incidentally noted circumferential bladder wall thickening, which is nonspecific, but can be seen in the setting of cystitis or chronic bladder outlet obstruction. Correlate with urinalysis. 3. Trace right pleural effusion. 4. Aortic atherosclerosis. 5. 7 mm hypodense lesion of the inferior aspect of the right hepatic lobe is assessed due to the absence of IV contrast too small to accurately characterize. Recommend further evaluation with a liver ultrasound. Aortic Atherosclerosis  (ICD10-I70.0). Electronically Signed   By: Lorenza Cambridge M.D.   On: 12/24/2022 11:45   CT HEAD WO CONTRAST  Result Date: 12/23/2022 CLINICAL DATA:  Neuro deficit, acute, stroke suspected; Neck trauma (Age >= 65y) EXAM: CT HEAD WITHOUT CONTRAST CT CERVICAL SPINE WITHOUT CONTRAST TECHNIQUE: Multidetector CT imaging of the head and cervical spine was performed following the standard protocol without intravenous contrast. Multiplanar CT image reconstructions of the cervical spine were also generated. RADIATION DOSE REDUCTION: This exam was performed according to the departmental dose-optimization program which includes automated exposure control, adjustment of the mA and/or kV according to patient size and/or use of iterative reconstruction technique. COMPARISON:  CT head 10/21/2022 FINDINGS: CT HEAD FINDINGS Brain: Cerebral ventricle sizes are concordant with the degree of cerebral volume loss. Patchy and confluent areas of decreased attenuation are noted throughout the deep and periventricular white matter of the cerebral hemispheres bilaterally, compatible with chronic microvascular ischemic disease. No evidence of large-territorial acute infarction. No parenchymal hemorrhage. No mass lesion. No extra-axial collection. No mass effect or midline shift. No hydrocephalus. Basilar cisterns are patent. Vascular: No hyperdense vessel. Skull: No acute fracture or focal lesion. Sinuses/Orbits: Paranasal sinuses and mastoid air cells are clear. Redemonstration of right orbit surgical changes. Otherwise the orbits are unremarkable. Other: Similar retained metallic densities along the scalp. CT CERVICAL SPINE FINDINGS Alignment: Normal. Skull base and vertebrae: Markedly limited evaluation due to motion artifact. Multilevel degenerative changes. Question osseous neural foraminal stenosis. No severe osseous central canal stenosis. Soft tissues and spinal canal: No prevertebral fluid or swelling. No visible canal hematoma.  Upper chest: Unremarkable. Other: None. IMPRESSION: 1. No acute intracranial abnormality. 2. Markedly limited evaluation of the spine for fracture due to motion artifact. Recommend repeat Electronically Signed   By: Tish Frederickson M.D.   On: 12/23/2022 21:40   CT Cervical Spine Wo Contrast  Result Date: 12/23/2022 CLINICAL DATA:  Neuro deficit, acute, stroke suspected; Neck trauma (Age >= 65y) EXAM: CT HEAD WITHOUT CONTRAST CT CERVICAL SPINE WITHOUT CONTRAST TECHNIQUE: Multidetector CT imaging of the head and cervical spine was performed following the standard protocol without intravenous contrast. Multiplanar CT image reconstructions of the cervical spine were also generated. RADIATION DOSE REDUCTION: This exam was performed according to the departmental dose-optimization  program which includes automated exposure control, adjustment of the mA and/or kV according to patient size and/or use of iterative reconstruction technique. COMPARISON:  CT head 10/21/2022 FINDINGS: CT HEAD FINDINGS Brain: Cerebral ventricle sizes are concordant with the degree of cerebral volume loss. Patchy and confluent areas of decreased attenuation are noted throughout the deep and periventricular white matter of the cerebral hemispheres bilaterally, compatible with chronic microvascular ischemic disease. No evidence of large-territorial acute infarction. No parenchymal hemorrhage. No mass lesion. No extra-axial collection. No mass effect or midline shift. No hydrocephalus. Basilar cisterns are patent. Vascular: No hyperdense vessel. Skull: No acute fracture or focal lesion. Sinuses/Orbits: Paranasal sinuses and mastoid air cells are clear. Redemonstration of right orbit surgical changes. Otherwise the orbits are unremarkable. Other: Similar retained metallic densities along the scalp. CT CERVICAL SPINE FINDINGS Alignment: Normal. Skull base and vertebrae: Markedly limited evaluation due to motion artifact. Multilevel degenerative  changes. Question osseous neural foraminal stenosis. No severe osseous central canal stenosis. Soft tissues and spinal canal: No prevertebral fluid or swelling. No visible canal hematoma. Upper chest: Unremarkable. Other: None. IMPRESSION: 1. No acute intracranial abnormality. 2. Markedly limited evaluation of the spine for fracture due to motion artifact. Recommend repeat Electronically Signed   By: Tish Frederickson M.D.   On: 12/23/2022 21:40   DG Chest 1 View  Result Date: 12/23/2022 CLINICAL DATA:  Weakness, fell EXAM: CHEST  1 VIEW COMPARISON:  10/18/2022 FINDINGS: Single frontal view of the chest demonstrates a stable enlarged cardiac silhouette. No acute airspace disease, effusion, or pneumothorax. No acute displaced fracture. IMPRESSION: 1. Stable chest, no acute process. Electronically Signed   By: Sharlet Salina M.D.   On: 12/23/2022 20:41   DG Lumbar Spine Complete  Result Date: 12/23/2022 CLINICAL DATA:  Weakness, fell EXAM: LUMBAR SPINE - COMPLETE 4+ VIEW COMPARISON:  None Available. FINDINGS: Frontal, bilateral oblique, lateral views of the lumbar spine are obtained. There are 5 non-rib-bearing lumbar type vertebral bodies in grossly normal anatomic alignment. No acute displaced fracture. Multilevel lumbar spondylosis and facet hypertrophy greatest at L4-5 and L5-S1. Sacroiliac joints are normal. IMPRESSION: 1. Lower lumbar spondylosis and facet hypertrophy. No acute fracture. Electronically Signed   By: Sharlet Salina M.D.   On: 12/23/2022 20:40   DG Thoracic Spine 2 View  Result Date: 12/23/2022 CLINICAL DATA:  Weakness, fell EXAM: THORACIC SPINE 2 VIEWS COMPARISON:  None Available. FINDINGS: Frontal and lateral views of the thoracic spine are obtained. Alignment is anatomic. No acute fractures. Mild lower thoracic spondylosis. Paraspinal soft tissues are unremarkable. IMPRESSION: 1. No acute bony abnormality. Electronically Signed   By: Sharlet Salina M.D.   On: 12/23/2022 20:40   DG  Tibia/Fibula Left  Result Date: 12/23/2022 CLINICAL DATA:  Weakness, fell, inability to bear weight EXAM: LEFT TIBIA AND FIBULA - 2 VIEW COMPARISON:  None Available. FINDINGS: Frontal and lateral views of the left tibia and fibula are obtained. Evidence of prior healed distal left tibial diaphyseal fracture. No acute fracture, subluxation, or dislocation. Moderate 3 compartmental left knee osteoarthritis. Left ankle is unremarkable. Soft tissues are normal. IMPRESSION: 1. No acute displaced fracture. 2. Moderate left knee osteoarthritis. Electronically Signed   By: Sharlet Salina M.D.   On: 12/23/2022 20:39   DG Pelvis 1-2 Views  Result Date: 12/23/2022 CLINICAL DATA:  Weakness, fell, inability to bear weight on left side EXAM: PELVIS - 1-2 VIEW COMPARISON:  None Available. FINDINGS: Supine frontal view of the pelvis was obtained. No evidence of fracture, subluxation,  or dislocation within either hip. Mild symmetrical bilateral hip osteoarthritis. The remainder of the bony pelvis is unremarkable. Sacroiliac joints are normal. IMPRESSION: 1. No acute displaced fracture. 2. Symmetrical bilateral hip osteoarthritis. Electronically Signed   By: Sharlet Salina M.D.   On: 12/23/2022 20:38      Signature  -   Susa Raring M.D on 12/26/2022 at 7:58 AM   -  To page go to [email or web address]

## 2022-12-26 NOTE — TOC Progression Note (Addendum)
Transition of Care [facility]) - Progression Note    Patient Details  Name: Tristan Gardner MRN: [id number] Date of Birth: [birth year]  [facility] CM/SW Contact  Mearl Latin, LCSW Phone Number: 12/26/2022, 2:32 PM  Clinical Narrative:    CSW met with patient and his granddaughter at bedside and provided SNF bed offers and Medicare ratings list. He contacted his daughter, Burna Mortimer, on speaker phone and requested CSW explain to her as well so she could help him decide. Burna Mortimer requested CSW email her the list ([email or web address]).   Patient's son Earlene Plater [phone]) walked into the room and told Burna Mortimer that he needed to talk to her later. After the phone call, he told CSW that he is the "big boss" now and that he would be taking the patient back to his house in [place] since patient has not been thriving in [place] and son is a Emergency planning/management officer and his wife is a Engineer, civil (consulting). Patient stated that he would think about it and discuss it later as he has his doctors locally. CSW will follow up.   Expected Discharge Plan: Skilled Nursing Facility Barriers to Discharge: Continued Medical Work up, English as a second language teacher, SNF Pending bed offer  Expected Discharge Plan and Services In-house Referral: Clinical Social Work   Post Acute Care Choice: Skilled Nursing Facility Living arrangements for the past 2 months: Single Family Home                                       Social Determinants of Health (SDOH) Interventions SDOH Screenings   Tobacco Use: Medium Risk (12/23/2022)    Readmission Risk Interventions     No data to display
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
## 2022-12-27 ENCOUNTER — Inpatient Hospital Stay (HOSPITAL_COMMUNITY): Payer: Medicare PPO

## 2022-12-27 ENCOUNTER — Inpatient Hospital Stay (HOSPITAL_COMMUNITY): Payer: Medicare PPO | Admitting: Certified Registered"

## 2022-12-27 ENCOUNTER — Encounter (HOSPITAL_COMMUNITY): Payer: Self-pay | Admitting: Internal Medicine

## 2022-12-27 ENCOUNTER — Encounter (HOSPITAL_COMMUNITY)
Admission: EM | Disposition: A | Discharge status: Skilled Nursing Facility | Payer: Self-pay | Source: Home / Self Care | Attending: Internal Medicine

## 2022-12-27 DIAGNOSIS — Z87891 Personal history of nicotine dependence: Secondary | ICD-10-CM

## 2022-12-27 DIAGNOSIS — R29818 Other symptoms and signs involving the nervous system: Secondary | ICD-10-CM | POA: Diagnosis not present

## 2022-12-27 DIAGNOSIS — M48061 Spinal stenosis, lumbar region without neurogenic claudication: Secondary | ICD-10-CM | POA: Diagnosis not present

## 2022-12-27 DIAGNOSIS — I2510 Atherosclerotic heart disease of native coronary artery without angina pectoris: Secondary | ICD-10-CM | POA: Diagnosis not present

## 2022-12-27 DIAGNOSIS — I10 Essential (primary) hypertension: Secondary | ICD-10-CM

## 2022-12-27 DIAGNOSIS — G959 Disease of spinal cord, unspecified: Secondary | ICD-10-CM | POA: Diagnosis not present

## 2022-12-27 HISTORY — PX: IR INJECT/THERA/INC NEEDLE/CATH/PLC EPI/LUMB/SAC W/IMG: IMG6130

## 2022-12-27 HISTORY — PX: RADIOLOGY WITH ANESTHESIA: SHX6223

## 2022-12-27 SURGERY — RADIOLOGY WITH ANESTHESIA
Anesthesia: General

## 2022-12-27 MED ORDER — LIDOCAINE HCL 1 % IJ SOLN
INTRAMUSCULAR | Status: AC
  Filled 2022-12-27: qty 20

## 2022-12-27 MED ORDER — SUGAMMADEX SODIUM 200 MG/2ML IV SOLN
INTRAVENOUS | Status: DC | PRN
  Administered 2022-12-27: 200 mg via INTRAVENOUS

## 2022-12-27 MED ORDER — ORAL CARE MOUTH RINSE: 15.0000 mL | Freq: Once | OROMUCOSAL | Status: AC

## 2022-12-27 MED ORDER — LACTATED RINGERS IV SOLN: INTRAVENOUS | Status: DC

## 2022-12-27 MED ORDER — PROPOFOL 10 MG/ML IV BOLUS
INTRAVENOUS | Status: DC | PRN
  Administered 2022-12-27 (×3): 50 mg via INTRAVENOUS
  Administered 2022-12-27: 60 mg via INTRAVENOUS
  Administered 2022-12-27: 100 mg via INTRAVENOUS

## 2022-12-27 MED ORDER — PROPOFOL 500 MG/50ML IV EMUL
INTRAVENOUS | Status: DC | PRN
  Administered 2022-12-27: 75 ug/kg/min via INTRAVENOUS

## 2022-12-27 MED ORDER — ROCURONIUM BROMIDE 10 MG/ML (PF) SYRINGE
PREFILLED_SYRINGE | INTRAVENOUS | Status: DC | PRN
  Administered 2022-12-27: 10 mg via INTRAVENOUS
  Administered 2022-12-27: 50 mg via INTRAVENOUS

## 2022-12-27 MED ORDER — PHENYLEPHRINE 80 MCG/ML (10ML) SYRINGE FOR IV PUSH (FOR BLOOD PRESSURE SUPPORT)
PREFILLED_SYRINGE | INTRAVENOUS | Status: DC | PRN
  Administered 2022-12-27: 160 ug via INTRAVENOUS

## 2022-12-27 MED ORDER — ACETAMINOPHEN 500 MG PO TABS: 1000.0000 mg | ORAL_TABLET | Freq: Once | ORAL | Status: DC | PRN

## 2022-12-27 MED ORDER — ACETAMINOPHEN 160 MG/5ML PO SOLN: 1000.0000 mg | Freq: Once | ORAL | Status: DC | PRN

## 2022-12-27 MED ORDER — CHLORHEXIDINE GLUCONATE 0.12 % MT SOLN
OROMUCOSAL | Status: AC
  Administered 2022-12-27: 15 mL via OROMUCOSAL
  Filled 2022-12-27: qty 15

## 2022-12-27 MED ORDER — CHLORHEXIDINE GLUCONATE 0.12 % MT SOLN: 15.0000 mL | Freq: Once | OROMUCOSAL | Status: AC

## 2022-12-27 MED ORDER — ACETAMINOPHEN 10 MG/ML IV SOLN: 1000.0000 mg | Freq: Once | INTRAVENOUS | Status: DC | PRN

## 2022-12-27 MED ORDER — METOPROLOL TARTRATE 5 MG/5ML IV SOLN
INTRAVENOUS | Status: DC | PRN
  Administered 2022-12-27: 1 mg via INTRAVENOUS

## 2022-12-27 MED ORDER — ONDANSETRON HCL 4 MG/2ML IJ SOLN
INTRAMUSCULAR | Status: DC | PRN
  Administered 2022-12-27: 4 mg via INTRAVENOUS

## 2022-12-27 MED ORDER — METOPROLOL TARTRATE 5 MG/5ML IV SOLN
INTRAVENOUS | Status: AC
  Filled 2022-12-27: qty 5

## 2022-12-27 NOTE — Anesthesia Preprocedure Evaluation (Addendum)
Anesthesia Evaluation  Patient identified by MRN, date of birth, ID band Patient awake    Reviewed: Allergy & Precautions, NPO status , Patient's Chart, lab work & pertinent test results  History of Anesthesia Complications Negative for: history of anesthetic complications  Airway Mallampati: III  TM Distance: >3 FB Neck ROM: Full    Dental  (+) Dental Advisory Given   Pulmonary neg shortness of breath, neg sleep apnea, neg COPD, neg recent URI, former smoker   breath sounds clear to auscultation       Cardiovascular hypertension, Pt. on home beta blockers and Pt. on medications + CAD and + Past MI  + dysrhythmias Atrial Fibrillation  Rhythm:Irregular   1. Left ventricular ejection fraction, by estimation, is 50 to 55%. The  left ventricle has low normal function. The left ventricle has no regional  wall motion abnormalities. There is moderate concentric left ventricular  hypertrophy. Left ventricular  diastolic function could not be evaluated.   2. Right ventricular systolic function was not well visualized. The right  ventricular size is not well visualized. There is normal pulmonary artery  systolic pressure.   3. Left atrial size was mild to moderately dilated.   4. The mitral valve is normal in structure. Mild mitral valve  regurgitation. No evidence of mitral stenosis.   5. The aortic valve is tricuspid. There is mild calcification of the  aortic valve. There is mild thickening of the aortic valve. Aortic valve  regurgitation is not visualized.   6. Aortic dilatation noted. There is mild dilatation of the ascending  aorta, measuring 40 mm.   7. The inferior vena cava is normal in size with greater than 50%  respiratory variability, suggesting right atrial pressure of 3 mmHg.     Neuro/Psych  Neuromuscular disease CVA    GI/Hepatic PUD,,,  Endo/Other  negative endocrine ROS    Renal/GU negative Renal ROSLab  Results      Component                Value               Date                      CREATININE               0.91                12/26/2022                Musculoskeletal negative musculoskeletal ROS (+)    Abdominal   Peds  Hematology  (+) Blood dyscrasia, anemia Lab Results      Component                Value               Date                      WBC                      4.0                 12/25/2022                HGB                      10.3 (L)  12/25/2022                HCT                      33.2 (L)            12/25/2022                MCV                      83.6                12/25/2022                PLT                      183                 12/25/2022              Anesthesia Other Findings   Reproductive/Obstetrics                             Anesthesia Physical Anesthesia Plan  ASA: 3  Anesthesia Plan: General   Post-op Pain Management: Minimal or no pain anticipated   Induction: Intravenous  PONV Risk Score and Plan: 2 and Ondansetron, Dexamethasone and Propofol infusion  Airway Management Planned: Oral ETT  Additional Equipment: None  Intra-op Plan:   Post-operative Plan: Extubation in OR  Informed Consent: I have reviewed the patients History and Physical, chart, labs and discussed the procedure including the risks, benefits and alternatives for the proposed anesthesia with the patient or authorized representative who has indicated his/her understanding and acceptance.     Dental advisory given  Plan Discussed with: CRNA  Anesthesia Plan Comments:        Anesthesia Quick Evaluation

## 2022-12-27 NOTE — H&P (Signed)
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 31 34 25  ALT 24 24 23 24   ALKPHOS 73 84 69 77  PROT 6.9 7.6 7.1 7.4  ALBUMIN 2.9* 3.1* 3.0* 2.8*    Assessment and Plan:  Scheduled today in IR for Myelogram with anesthesia Pt is aware of procedure benefits and risks- including but not limited to Infection; bleeding; damage to surrounding structures; post procedure  headache Consent signed and in chart  Electronically Signed: Robet Leu, PA-C 12/27/2022, 6:42 AM   I spent a total of 15 Minutes at the the patient's bedside AND on the patient's hospital floor or unit, greater than 50% of which was counseling/coordinating care for Myelogram with anesthesia

## 2022-12-27 NOTE — Progress Notes (Signed)
Subjective: Continues to have spasms, no significant change  Exam: Vitals:   12/27/22 1130 12/27/22 1600  BP: 116/75 117/62  Pulse: 60 61  Resp: 16 (!) 21  Temp: 97.9 F (36.6 C) 98.1 F (36.7 C)  SpO2: 97% 100%   Gen: In bed, NAD Resp: non-labored breathing, no acute distress Abd: soft, nt  Neuro: MS: Awake, alert, interactive and appropriate.  He initially answered that it was January, but after some prompting he corrected himself to state that is "the summer" but he is not sure which month exactly.  He does know the year is 2024 and that he is at [facility]. CN: Right pupil appears postsurgical left is reactive, EOMI Motor: He has good strength in his upper extremities, and his left lower extremity he has significant  weakness with foot eversion(1/5), inversion(1/5), dorsiflexion(2/5) and plantarflexion(1/5), he has relatively preserved knee extension(4/5) though he does have some weakness there as well. Sensory: Sensation is diminished in the entire left leg including the femoral nerve distribution, also diminished circumferentially in the right leg though less so than the left. Of note he has an area of dysesthesia in the mid thoracic region, but I am unsure what to make of this, it is only present on the left and unclear if related to his current presentation.  DTR: 1+ and symmetric at the biceps, 2+ in the patella bilaterally, toes are equivocal  Pertinent Labs: Creatinine 0.91  CT myelogram reviewed, severe spinal stenosis.  Impression: 88 year old male with very complex history/exam.  The progressive nature coupled with his exam do make me think that his lumbar stenosis is likely the culprit lesion for his worsening gait.  I have discussed this with neurosurgery.  At this point, care will be supportive until surgery, but neurology will continue to be available if needed.  Recommendations: 1) I discussed the patient with Dr. Maurice Small yesterday and today, he reviewed the  images today and does agree that the patient likely needs surgical intervention.  He will likely be able to do the procedure sometime next week. 2) continue baclofen, gabapentin 3) neurology will be available on an as-needed basis, please call with further questions or concerns.  Ritta Slot, MD [facility] [phone]  If 7pm- 7am, please page neurology on call as listed in AMION.

## 2022-12-27 NOTE — Plan of Care (Signed)
  Problem: Education: Goal: Knowledge of disease or condition will improve Outcome: Progressing   Problem: Ischemic Stroke/TIA Tissue Perfusion: Goal: Complications of ischemic stroke/TIA will be minimized Outcome: Progressing   Problem: Clinical Measurements: Goal: Ability to maintain clinical measurements within normal limits will improve Outcome: Progressing Goal: Respiratory complications will improve Outcome: Progressing   Problem: Activity: Goal: Risk for activity intolerance will decrease Outcome: Progressing

## 2022-12-27 NOTE — Progress Notes (Signed)
Physical Therapy Treatment Patient Details Name: Tristan Gardner MRN: [id number] DOB: [birth year] Today's Date: 12/27/2022   History of Present Illness The pt is an 88 yo male presenting 6/22 with inability to bear weight in L or R LE, frequent falls, R sided facial droop, and intermittent ataxia. CT showed no acute abnormality, pt unable to get MRI. PMH includes: afib, CAD, HTN, NSTEMI, CVA, sciatica, chronic pain, and obesity.    PT Comments    Pt received in supine, agreeable to therapy session after procedure and with good participation and tolerance for supine and seated BLE exercises. Defer OOB mobility due to pt fatigue after recent procedure and bed exercises, and due to lack of +2 assist at time of session. Pt needing up to minA for transfer to long sitting but needs BUE support to maintain long sitting and quick to fatigue. Pt needs AAROM for all LLE ROM and with poor RLE hip flexion ROM against gravity. Plan to work on transfers OOB next session if able. Pt continues to benefit from PT services to progress toward functional mobility goals.    Recommendations for follow up therapy are one component of a multi-disciplinary discharge planning process, led by the attending physician.  Recommendations may be updated based on patient status, additional functional criteria and insurance authorization.  Follow Up Recommendations  Can patient physically be transported by private vehicle: No    Assistance Recommended at Discharge Frequent or constant Supervision/Assistance  Patient can return home with the following A lot of help with walking and/or transfers;A little help with bathing/dressing/bathroom;Assistance with cooking/housework;Assist for transportation;Help with stairs or ramp for entrance   Equipment Recommendations  Rolling walker (2 wheels) (continue to assess)    Recommendations for Other Services       Precautions / Restrictions Precautions Precautions: Fall Precaution  Comments: pt has had ems come two times to assist with falls recently, also fell day of admission Restrictions Weight Bearing Restrictions: No     Mobility  Bed Mobility Overal bed mobility: Needs Assistance Bed Mobility: Supine to Sit     Supine to sit: Min assist, HOB elevated     General bed mobility comments: to long sitting with BUE pulling on bed rails; defer EOB/OOB due to lack of +2 assist and pt fatigued after recent procedure with sedation.    Transfers Overall transfer level: Needs assistance                 General transfer comment: defer due to lack of +2 assist at time of session    Ambulation/Gait               General Gait Details: pt unable at this time   Stairs             Wheelchair Mobility    Modified Rankin (Stroke Patients Only)       Balance Overall balance assessment: Needs assistance Sitting-balance support: Feet supported, Bilateral upper extremity supported Sitting balance-Leahy Scale: Fair Sitting balance - Comments: needs BUE support for long sitting in bed       Standing balance comment: defer due to lack of +2 assist                            Cognition Arousal/Alertness: Awake/alert Behavior During Therapy: WFL for tasks assessed/performed Overall Cognitive Status: No family/caregiver present to determine baseline cognitive functioning  General Comments: pt very HOH which I believe contributes to his delayed processing. Pt hears better on L side        Exercises Other Exercises Other Exercises: supine LLE AROM/RLE AAROM: ankle pumps, SAQ, hip flexion, hip abduction x10 reps ea Other Exercises: bed "crunches" with BUE supported by bed rail from elevated HOB x10 reps Other Exercises: lateral leans with elbow taps in long sitting/bed chair posture x5 reps ea direction, needs assist for improved leans/ROM    General Comments General comments (skin  integrity, edema, etc.): HR 59-61 bpm in supine per tele; SpO2 100% on 2L O2 Tristan Gardner      Pertinent Vitals/Pain Pain Assessment Pain Assessment: Faces Faces Pain Scale: Hurts a little bit Pain Location: LLE and LLQ tender to palpation Pain Descriptors / Indicators: Aching, Cramping, Discomfort    Home Living                          Prior Function            PT Goals (current goals can now be found in the care plan section) Acute Rehab PT Goals Patient Stated Goal: return to independence and strength prior to return home PT Goal Formulation: With patient Time For Goal Achievement: 01/07/23 Progress towards PT goals: Progressing toward goals    Frequency    Min 4X/week      PT Plan Current plan remains appropriate    Co-evaluation              AM-PAC PT "6 Clicks" Mobility   Outcome Measure  Help needed turning from your back to your side while in a flat bed without using bedrails?: A Little Help needed moving from lying on your back to sitting on the side of a flat bed without using bedrails?: A Lot (from flat bed) Help needed moving to and from a bed to a chair (including a wheelchair)?: Total Help needed standing up from a chair using your arms (e.g., wheelchair or bedside chair)?: A Lot Help needed to walk in hospital room?: Total Help needed climbing 3-5 steps with a railing? : Total 6 Click Score: 10    End of Session Equipment Utilized During Treatment: Oxygen Activity Tolerance: Patient tolerated treatment well;Patient limited by fatigue Patient left: with call bell/phone within reach;in bed;with bed alarm set;Other (comment);with SCD's reapplied (bed chair posture) Nurse Communication: Mobility status;Other (comment) (pt asking about ordering dinner; MD notified of pt c/o LLQ pain) PT Visit Diagnosis: Unsteadiness on feet (R26.81);Other abnormalities of gait and mobility (R26.89);Muscle weakness (generalized) (M62.81)     Time: 1610-9604 PT  Time Calculation (min) (ACUTE ONLY): 21 min  Charges:  $Therapeutic Exercise: 8-22 mins                     Robbin Loughmiller P., PTA Acute Rehabilitation Services Secure Chat Preferred 9a-5:30pm Office: [phone]    Dorathy Kinsman [facility] 12/27/2022, 7:05 PM

## 2022-12-27 NOTE — Transfer of Care (Signed)
Immediate Anesthesia Transfer of Care Note  Patient: Tristan Gardner  Procedure(s) Performed: TOTAL SPINE MYELOGRAM  Patient Location: PACU  Anesthesia Type:General  Level of Consciousness: drowsy and patient cooperative  Airway & Oxygen Therapy: Patient Spontanous Breathing and Patient connected to nasal cannula oxygen  Post-op Assessment: Report given to RN, Post -op Vital signs reviewed and stable, and Patient moving all extremities X 4  Post vital signs: Reviewed and stable  Last Vitals:  Vitals Value Taken Time  BP 114/73 12/27/22 1101  Temp    Pulse 59 12/27/22 1105  Resp 21 12/27/22 1105  SpO2 95 % 12/27/22 1105  Vitals shown include unvalidated device data.  Last Pain:  Vitals:   12/27/22 0811  TempSrc: Oral  PainSc:          Complications: No notable events documented.

## 2022-12-27 NOTE — Progress Notes (Signed)
Progress Note    Tristan Gardner  ZOX:[id number] DOB: 1934/12/08  DOA: 12/23/2022 PCP: Porfirio Oar, PA      Brief Narrative:    Medical records reviewed and are as summarized below:  Tristan Gardner is a 88 y.o. male medical history significant of chronic atrial fibrillation on Xarelto, known CAD with a prior history of an NSTEMI, remote CVA on Plavix who presents to the emergency department from home via EMS due to fall and generalized weakness.  It appears he has had chronic low back pain and lower extremity weakness for about a year. There was also question of some garbled speech. He was seen by neurology and admitted for further workup of his left lower extremity weakness and pain.       Assessment/Plan:   Principal Problem:   Neurological deficit, transient Active Problems:   Chronic atrial fibrillation with RVR (HCC)   Near syncope   Essential hypertension   History of CVA (cerebrovascular accident)   Peripheral neuropathy    Body mass index is 34.17 kg/m.  (Obesity)   Ongoing lower back pain, bilateral lower extremity weakness left more than right, left lower extremity pain.  He seems to have history suggestive of L-spine disease with radiculopathy, CT of the T and L-spine inconclusive.  No cord compression on CT thoracic, lumbar spine. Anticoagulation and Plavix held, neurology following PT and OT recommended discharge to SNF.   Dysarthria: He still has some slurred speech. CT head unremarkable cannot do MRI due to short wounds and metal in his skull, defer further management to neurology although management unlikely to change as he is already on anticoagulation and Plavix.   7 mm hypodense lesion of the inferior aspect of the right hepatic lobe - outpatient right upper quadrant ultrasound by PCP.   Bladder wall thickening.  No evidence of UTI   Chronic A-fib .  [place] vas 2 score of greater than 5.  Continue metoprolol.  Xarelto on hold.  History of  peripheral neuropathy.  Continue Neurontin.   Hypertension.  Continue metoprolol, amlodipine and hydralazine   Hypomagnesemia.  Improved     Diet Order             Diet NPO time specified Except for: Sips with Meds  Diet effective midnight                            Consultants: Neurologist  Procedures: CT myelogram    Medications:    amLODipine  10 mg Oral Daily   atorvastatin  40 mg Oral QHS   baclofen  10 mg Oral TID   gabapentin  300 mg Oral TID   hydrALAZINE  50 mg Oral Q8H   metoprolol succinate  50 mg Oral Daily   Continuous Infusions:  sodium chloride 125 mL/hr at 12/25/22 1506     Anti-infectives (From admission, onward)    None              Family Communication/Anticipated D/C date and plan/Code Status   DVT prophylaxis: SCDs Start: 12/24/22 0856 SCDs Start: 12/24/22 0153     Code Status: Full Code  Family Communication: None Disposition Plan: Plan to discharge to SNF when cleared by neurologist   Status is: Inpatient Remains inpatient appropriate because: Lower extremity weakness       Subjective:   Interval events noted.  He is unable to provide any history.  He appears confused.  Objective:  Vitals:   12/27/22 0811 12/27/22 1100 12/27/22 1115 12/27/22 1130  BP: 115/70 114/73 119/75 116/75  Pulse: (!) 54 62 (!) 59 60  Resp: 17  16 16   Temp: 98.6 F (37 C) 97.9 F (36.6 C)  97.9 F (36.6 C)  TempSrc: Oral     SpO2: 100% 93% 96% 97%  Weight: 111.1 kg     Height: 5\' 11"  (1.803 m)      No data found.   Intake/Output Summary (Last 24 hours) at 12/27/2022 1303 Last data filed at 12/27/2022 1103 Gross per 24 hour  Intake 300 ml  Output 700 ml  Net -400 ml   Filed Weights   12/23/22 2233 12/27/22 0811  Weight: 111.1 kg 111.1 kg    Exam:  GEN: NAD SKIN: Warm and dry EYES: Anicteric ENT: MMM CV: RRR PULM: CTA B ABD: soft, ND, NT, +BS CNS: AAO x 2 (person and place), dysarthric, unable  to move lower extremities but moves upper extremities spontaneously. EXT: No edema or tenderness        Data Reviewed:   I have personally reviewed following labs and imaging studies:  Labs: Labs show the following:   Basic Metabolic Panel: Recent Labs  Lab 12/23/22 2115 12/23/22 2346 12/24/22 0239 12/25/22 0452 12/26/22 0317  NA 135 138 137 136 135  K 4.4 4.4 4.4 3.9 4.3  CL 104 107 102 104 103  CO2 19*  --  20* 21* 18*  GLUCOSE 97 104* 127* 95 128*  BUN 12 11 10  7* 8  CREATININE 1.30* 1.10 1.11 0.90 0.91  CALCIUM 8.8*  --  8.7* 8.8* 8.9  MG  --   --  1.9 1.6* 2.0  PHOS  --   --  3.6 3.4  --    GFR Estimated Creatinine Clearance: 75.2 mL/min (by C-G formula based on SCr of 0.91 mg/dL). Liver Function Tests: Recent Labs  Lab 12/23/22 2115 12/24/22 0239 12/25/22 0452  AST 31 34 25  ALT 24 23 24   ALKPHOS 84 69 77  BILITOT 0.4 0.6 0.7  PROT 7.6 7.1 7.4  ALBUMIN 3.1* 3.0* 2.8*   No results for input(s): "LIPASE", "AMYLASE" in the last 168 hours. No results for input(s): "AMMONIA" in the last 168 hours. Coagulation profile No results for input(s): "INR", "PROTIME" in the last 168 hours.  CBC: Recent Labs  Lab 12/23/22 2115 12/23/22 2346 12/24/22 0239 12/25/22 0452  WBC 4.6  --  4.8 4.0  NEUTROABS 2.9  --   --   --   HGB 10.3* 12.2* 10.5* 10.3*  HCT 34.4* 36.0* 32.9* 33.2*  MCV 83.9  --  82.0 83.6  PLT 203  --  171 183   Cardiac Enzymes: No results for input(s): "CKTOTAL", "CKMB", "CKMBINDEX", "TROPONINI" in the last 168 hours. BNP (last 3 results) No results for input(s): "PROBNP" in the last 8760 hours. CBG: No results for input(s): "GLUCAP" in the last 168 hours. D-Dimer: No results for input(s): "DDIMER" in the last 72 hours. Hgb A1c: Recent Labs    12/25/22 0452  HGBA1C 6.1*   Lipid Profile: Recent Labs    12/25/22 0452  CHOL 128  HDL 57  LDLCALC 65  TRIG 29  CHOLHDL 2.2   Thyroid function studies: No results for input(s):  "TSH", "T4TOTAL", "T3FREE", "THYROIDAB" in the last 72 hours.  Invalid input(s): "FREET3" Anemia work up: No results for input(s): "VITAMINB12", "FOLATE", "FERRITIN", "TIBC", "IRON", "RETICCTPCT" in the last 72 hours. Sepsis Labs: Recent Labs  Lab 12/23/22 2115 12/24/22 0239 12/25/22 0452  WBC 4.6 4.8 4.0    Microbiology No results found for this or any previous visit (from the past 240 hour(s)).  Procedures and diagnostic studies:  CT LUMBAR SPINE W CONTRAST  Result Date: 12/27/2022 CLINICAL DATA:  Acute myelopathy. EXAM: CT MYELOGRAPHY LUMBAR SPINE TECHNIQUE: CT imaging of the lumbar spine was performed after Isovue 200M contrast administration. Multiplanar CT image reconstructions were also generated. RADIATION DOSE REDUCTION: This exam was performed according to the departmental dose-optimization program which includes automated exposure control, adjustment of the mA and/or kV according to patient size and/or use of iterative reconstruction technique. COMPARISON:  12/24/2022 FINDINGS: Contrast injection described separately. Segmentation:  5 lumbar type vertebral bodies assumed. Alignment: No significant malalignment. Vertebrae: No fracture or focal bone lesion. Conus medullaris: Extends to the L1-2 level and appears normal. Paraspinal and other soft tissues: Aortic atherosclerosis. Disc levels: L1-2: Bulging of the disc. Facet and ligamentous hypertrophy. Mild canal stenosis but no likely central compression. Foraminal narrowing left more than right could affect the exiting L1 nerves. L2-3: Endplate osteophytes and bulging of the disc. Facet and ligamentous hypertrophy. Moderate multifactorial spinal stenosis. Bilateral foraminal stenosis. L3-4: Endplate osteophytes and shallow protrusion of the disc. Facet and ligamentous hypertrophy. Severe multifactorial canal and foraminal stenosis. L4-5: Endplate osteophytes and protrusion of the disc. Pronounced facet and ligamentous hypertrophy.  Severe multifactorial spinal stenosis and foraminal stenosis. L5-S1: Endplate osteophytes. Facet and ligamentous hypertrophy. Narrowing of the subarticular lateral recesses left more than right. Bilateral foraminal stenosis that could affect the exiting L5 nerves, worse on the left. IMPRESSION: 1. Advanced chronic degenerative changes throughout the lumbar region as outlined above. Severe multifactorial spinal stenosis at L3-4 and L4-5. Moderate multifactorial spinal stenosis at L2-3. Lateral recess stenosis at L5-S1 left more than right. Foraminal stenosis at L1-2, L2-3, L3-4 and L4-5 and at L5-S1. Aortic Atherosclerosis (ICD10-I70.0). Electronically Signed   By: Paulina Fusi M.D.   On: 12/27/2022 12:03   CT THORACIC SPINE W CONTRAST  Result Date: 12/27/2022 CLINICAL DATA:  Myelopathy EXAM: CT MYELOGRAPHY THORACIC SPINE TECHNIQUE: CT imaging of the thoracic spine was performed after intrathecal contrast administration. Multiplanar CT image reconstructions were also generated. RADIATION DOSE REDUCTION: This exam was performed according to the departmental dose-optimization program which includes automated exposure control, adjustment of the mA and/or kV according to patient size and/or use of iterative reconstruction technique. COMPARISON:  CT 3 days ago FINDINGS: Contrast injection described separately. Alignment:  Normal Vertebrae: No fracture or focal bone lesion. Cord: No spinal cord compression or visible cord lesion by myelography. Normal cord diameter. Paraspinal and other soft tissues:  Dependent pulmonary atelectasis. Disc levels: No significant disc level pathology. No disc herniation or compressive stenosis of the canal or foramina. Ordinary facet osteoarthritis, typical for age, arguably less advanced than often seen. IMPRESSION: 1. No spinal cord compression or visible cord lesion by myelography. 2. Ordinary facet osteoarthritis, typical for age, arguably less advanced than often seen. No disc  herniation or compressive stenosis of the canal or foramina. Electronically Signed   By: Paulina Fusi M.D.   On: 12/27/2022 11:58               LOS: 2 days   Tristan Gardner  [facility]   Pager on [email or web address]. If 7PM-7AM, please contact night-coverage at [email or web address]     12/27/2022, 1:03 PM

## 2022-12-27 NOTE — Progress Notes (Signed)
PT Cancellation Note  Patient Details Name: Redmond Whittley MRN: [id number] DOB: 04-19-1935   Cancelled Treatment:    Reason Eval/Treat Not Completed: (P) Patient at procedure or test/unavailable (Pt off unit for CT myelogram under sedation.)   Dorathy Kinsman Jaime Grizzell 12/27/2022, 10:16 AM

## 2022-12-27 NOTE — TOC Progression Note (Signed)
Transition of Care [facility]) - Progression Note    Patient Details  Name: Tristan Gardner MRN: [id number] Date of Birth: [birth year]  [facility] CM/SW Contact  Mearl Latin, LCSW Phone Number: 12/27/2022, 4:22 PM  Clinical Narrative:    CSW received request from patient's daughter, Burna Mortimer, for [facility] SNF. Per [employer], they can accept patient once stable.    Expected Discharge Plan: Skilled Nursing Facility Barriers to Discharge: Continued Medical Work up, English as a second language teacher, SNF Pending bed offer  Expected Discharge Plan and Services In-house Referral: Clinical Social Work   Post Acute Care Choice: Skilled Nursing Facility Living arrangements for the past 2 months: Single Family Home                                       Social Determinants of Health (SDOH) Interventions SDOH Screenings   Tobacco Use: Medium Risk (12/27/2022)    Readmission Risk Interventions     No data to display

## 2022-12-27 NOTE — Anesthesia Procedure Notes (Addendum)
Procedure Name: Intubation Date/Time: 12/27/2022 9:35 AM  Performed by: Alease Medina, CRNAPre-anesthesia Checklist: Patient identified, Emergency Drugs available, Suction available and Patient being monitored Patient Re-evaluated:Patient Re-evaluated prior to induction Oxygen Delivery Method: Circle system utilized Preoxygenation: Pre-oxygenation with 100% oxygen Induction Type: IV induction Ventilation: Mask ventilation without difficulty Laryngoscope Size: Mac and 4 Grade View: Grade II Tube type: Oral Tube size: 7.5 mm Number of attempts: 1 Airway Equipment and Method: Stylet and Oral airway Placement Confirmation: ETT inserted through vocal cords under direct vision, positive ETCO2 and breath sounds checked- equal and bilateral Secured at: 22 cm Tube secured with: Tape Dental Injury: Teeth and Oropharynx as per pre-operative assessment

## 2022-12-28 ENCOUNTER — Encounter (HOSPITAL_COMMUNITY): Payer: Self-pay | Admitting: Radiology

## 2022-12-28 DIAGNOSIS — M48061 Spinal stenosis, lumbar region without neurogenic claudication: Secondary | ICD-10-CM | POA: Diagnosis present

## 2022-12-28 DIAGNOSIS — R29818 Other symptoms and signs involving the nervous system: Secondary | ICD-10-CM | POA: Diagnosis not present

## 2022-12-28 MED ORDER — HEPARIN SODIUM (PORCINE) 5000 UNIT/ML IJ SOLN
5000.0000 [IU] | Freq: Three times a day (TID) | INTRAMUSCULAR | Status: DC
  Administered 2022-12-28 – 2022-12-31 (×9): 5000 [IU] via SUBCUTANEOUS
  Filled 2022-12-28 (×9): qty 1

## 2022-12-28 NOTE — Progress Notes (Addendum)
Progress Note    Tristan Gardner  NFA:[id number] DOB: August 09, 1934  DOA: 12/23/2022 PCP: Porfirio Oar, PA      Brief Narrative:    Medical records reviewed and are as summarized below:  Tristan Gardner is a 88 y.o. male medical history significant of chronic atrial fibrillation on Xarelto, known CAD with a prior history of an NSTEMI, remote CVA on Plavix who presents to the emergency department from home via EMS due to fall and generalized weakness.  It appears he has had chronic low back pain and lower extremity weakness for about a year. There was also question of some garbled speech. He was seen by neurology and admitted for further workup of his left lower extremity weakness and pain.       Assessment/Plan:   Principal Problem:   Neurological deficit, transient Active Problems:   Chronic atrial fibrillation with RVR (HCC)   Near syncope   Essential hypertension   History of CVA (cerebrovascular accident)   Peripheral neuropathy   Lumbar spinal stenosis    Body mass index is 34.17 kg/m.  (Obesity)   Severe lumbar spinal stenosis, ongoing lower back pain, bilateral lower extremity weakness left more than right, left lower extremity pain.  He seems to have history suggestive of L-spine disease with radiculopathy. No cord compression on CT thoracic, lumbar spine. CT myelogram showed severe lumbar spinal stenosis. Dr. Maurice Small, neurosurgeon, and plans to operate on the patient on Tuesday, January 02, 2023. Continue to hold Xarelto and Plavix in anticipation of neurosurgery. Use low-dose heparin for DVT prophylaxis for now. PT and OT recommended discharge to SNF.   Dysarthria: Improved. CT head unremarkable cannot do MRI due to short wounds and metal in his skull   7 mm hypodense lesion of the inferior aspect of the right hepatic lobe.   Outpatient follow-up with the PCP for right upper quadrant ultrasound.-    Bladder wall thickening.  No evidence of UTI   Chronic  A-fib .  [place] vas 2 score of greater than 5.   Metoprolol held today because of bradycardia.  Xarelto on hold.  History of peripheral neuropathy.  Continue Neurontin.   Hypertension.  Continue amlodipine and hydralazine if BP allows  AKI Improved.  Discontinue IV fluids   Hypomagnesemia.  Improved     Diet Order             DIET DYS 3 Room service appropriate? Yes; Fluid consistency: Thin  Diet effective now                            Consultants: Neurologist  Procedures: CT myelogram    Medications:    amLODipine  10 mg Oral Daily   atorvastatin  40 mg Oral QHS   baclofen  10 mg Oral TID   gabapentin  300 mg Oral TID   heparin injection (subcutaneous)  5,000 Units Subcutaneous Q8H   hydrALAZINE  50 mg Oral Q8H   metoprolol succinate  50 mg Oral Daily   Continuous Infusions:     Anti-infectives (From admission, onward)    None              Family Communication/Anticipated D/C date and plan/Code Status   DVT prophylaxis: heparin injection 5,000 Units Start: 12/28/22 1400 SCDs Start: 12/24/22 0856 SCDs Start: 12/24/22 0153     Code Status: Full Code  Family Communication: None Disposition Plan: Plan to discharge to SNF when cleared by  neurologist   Status is: Inpatient Remains inpatient appropriate because: Lower extremity weakness       Subjective:   Interval events noted.  He complains of weakness in bilateral legs and pain in the left leg.  He is more alert and communicative today.  Objective:    Vitals:   12/28/22 0753 12/28/22 0800 12/28/22 1140 12/28/22 1200  BP: 105/66  106/73   Pulse: (!) 56  (!) 55   Resp: 17     Temp:  97.9 F (36.6 C)  98.3 F (36.8 C)  TempSrc:  Oral  Oral  SpO2: 99%     Weight:      Height:       No data found.   Intake/Output Summary (Last 24 hours) at 12/28/2022 1222 Last data filed at 12/28/2022 1145 Gross per 24 hour  Intake 1202 ml  Output 3125 ml  Net -1923 ml    Filed Weights   12/23/22 2233 12/27/22 0811  Weight: 111.1 kg 111.1 kg    Exam:  GEN: NAD SKIN: Warm and dry EYES: EOMI ENT: MMM CV: Irregular rate and rhythm PULM: CTA B ABD: soft, ND, NT, +BS CNS: AAO x 3, paraparesis (power in RLE is 4/5, power in LLE is 2/5) EXT: No edema or tenderness       Data Reviewed:   I have personally reviewed following labs and imaging studies:  Labs: Labs show the following:   Basic Metabolic Panel: Recent Labs  Lab 12/23/22 2115 12/23/22 2346 12/24/22 0239 12/25/22 0452 12/26/22 0317  NA 135 138 137 136 135  K 4.4 4.4 4.4 3.9 4.3  CL 104 107 102 104 103  CO2 19*  --  20* 21* 18*  GLUCOSE 97 104* 127* 95 128*  BUN 12 11 10  7* 8  CREATININE 1.30* 1.10 1.11 0.90 0.91  CALCIUM 8.8*  --  8.7* 8.8* 8.9  MG  --   --  1.9 1.6* 2.0  PHOS  --   --  3.6 3.4  --    GFR Estimated Creatinine Clearance: 75.2 mL/min (by C-G formula based on SCr of 0.91 mg/dL). Liver Function Tests: Recent Labs  Lab 12/23/22 2115 12/24/22 0239 12/25/22 0452  AST 31 34 25  ALT 24 23 24   ALKPHOS 84 69 77  BILITOT 0.4 0.6 0.7  PROT 7.6 7.1 7.4  ALBUMIN 3.1* 3.0* 2.8*   No results for input(s): "LIPASE", "AMYLASE" in the last 168 hours. No results for input(s): "AMMONIA" in the last 168 hours. Coagulation profile No results for input(s): "INR", "PROTIME" in the last 168 hours.  CBC: Recent Labs  Lab 12/23/22 2115 12/23/22 2346 12/24/22 0239 12/25/22 0452  WBC 4.6  --  4.8 4.0  NEUTROABS 2.9  --   --   --   HGB 10.3* 12.2* 10.5* 10.3*  HCT 34.4* 36.0* 32.9* 33.2*  MCV 83.9  --  82.0 83.6  PLT 203  --  171 183   Cardiac Enzymes: No results for input(s): "CKTOTAL", "CKMB", "CKMBINDEX", "TROPONINI" in the last 168 hours. BNP (last 3 results) No results for input(s): "PROBNP" in the last 8760 hours. CBG: No results for input(s): "GLUCAP" in the last 168 hours. D-Dimer: No results for input(s): "DDIMER" in the last 72 hours. Hgb  A1c: No results for input(s): "HGBA1C" in the last 72 hours.  Lipid Profile: No results for input(s): "CHOL", "HDL", "LDLCALC", "TRIG", "CHOLHDL", "LDLDIRECT" in the last 72 hours.  Thyroid function studies: No results for input(s): "TSH", "  T4TOTAL", "T3FREE", "THYROIDAB" in the last 72 hours.  Invalid input(s): "FREET3" Anemia work up: No results for input(s): "VITAMINB12", "FOLATE", "FERRITIN", "TIBC", "IRON", "RETICCTPCT" in the last 72 hours. Sepsis Labs: Recent Labs  Lab 12/23/22 2115 12/24/22 0239 12/25/22 0452  WBC 4.6 4.8 4.0    Microbiology No results found for this or any previous visit (from the past 240 hour(s)).  Procedures and diagnostic studies:  IR INJECT DIAG/THERA/INC NEEDLE/CATH/PLC EPI/LUMB/SAC W/IMG  Result Date: 12/27/2022 CLINICAL DATA:  Acute myelopathy EXAM: CERVICAL AND LUMBAR MYELOGRAM FLUOROSCOPY: Radiation Exposure Index (as provided by the fluoroscopic device): 8 mGy air Kerma TECHNIQUE: After patient anesthetized by department of anesthesia and placed prone on the procedure table, an appropriate entry site was determined under fluoroscopy. Operator donned sterile gloves and mask. Skin site was marked, prepped with Betadine, and draped in usual sterile fashion, and infiltrated locally with 1% lidocaine. A 20 gauge spinal needle was advanced into the thecal sac at L2 from a left parasagittal approach. Clear colorless CSF returned. 10 mL Omnipaque 300 were administered intrathecally for myelography. Representative postcontrast lumbar and cervical images were obtained. COMPARISON:  None Available. FINDINGS: Lumbar imaging confirms intrathecal administration of the contrast. Cervical imaging demonstrates contrast up to the midthoracic cervical spine. See subsequent CT dictated separately for detailed evaluation of cervical, thoracic, and lumbar regions. IMPRESSION: Technically successful  myelogram. Electronically Signed   By: Corlis Leak M.D.   On: 12/27/2022  14:37   CT CERVICAL SPINE W CONTRAST  Result Date: 12/27/2022 CLINICAL DATA:  Myelopathy, acute common cervical spine EXAM: CT MYELOGRAPHY CERVICAL SPINE TECHNIQUE: CT imaging of the cervical spine was performed after Isovue 300M contrast administration. Multiplanar CT image reconstructions were also generated. RADIATION DOSE REDUCTION: This exam was performed according to the departmental dose-optimization program which includes automated exposure control, adjustment of the mA and/or kV according to patient size and/or use of iterative reconstruction technique. COMPARISON:  CT 12/23/2022 FINDINGS: Injection procedure dictated separately. Alignment: Straightening of the normal cervical lordosis. Vertebrae: No fracture or focal bone lesion. Some motion degradation in the mid cervical region. Cord: See description of each level below. Posterior Fossa and paraspinal tissues: Negative Disc levels: Foramen magnum is widely patent. C1-2 shows ordinary osteoarthritis but no encroachment upon the neural structures. C2-3: Facet osteoarthritis on the left. Uncovertebral hypertrophy left more than right. No central canal stenosis. Moderate bony foraminal narrowing on the left. C3-4: Endplate and uncovertebral osteophytes. Facet arthropathy right worse than left. Narrowing of the subarachnoid space of the central canal but no gross cord compression. Severe bilateral bony foraminal stenosis. C4-5: Endplate osteophytes but no compressive central canal stenosis. Facet osteoarthritis right worse than left. Bilateral bony foraminal narrowing, mild on the left and severe on the right. C5-6: Motion degraded. Endplate osteophytes efface the ventral subarachnoid space. There appears to be subarachnoid space present dorsal to the cord and I would doubt significant cervical cord compression. There is moderate bilateral bony foraminal stenosis. C6-7: This level also suffers from motion degradation. There appears to be chronic fusion of  the vertebral bodies. Sufficient patency of the canal and foramina. C7-T1: Significant motion degradation. Endplate osteophytes and uncovertebral osteophytes. Bilateral facet degeneration. No likely compressive central canal stenosis. Moderate bilateral foraminal narrowing. IMPRESSION: 1. Motion degraded study. 2. Advanced chronic degenerative changes throughout the cervical region as outlined above. No likely compressive central canal stenosis. There is some narrowing of the subarachnoid space of the central canal at C3-4 and C5-6, but no gross cord compression. 3.  Bony foraminal stenosis that could be significant on the left at C2-3, bilaterally at C3-4, on the right at C4-5 and bilaterally at C5-6 and C7-T1. 4. Chronic fusion of the C6 and C7 vertebral bodies. Sufficient patency of the canal and foramina. Electronically Signed   By: Paulina Fusi M.D.   On: 12/27/2022 13:02   CT LUMBAR SPINE W CONTRAST  Result Date: 12/27/2022 CLINICAL DATA:  Acute myelopathy. EXAM: CT MYELOGRAPHY LUMBAR SPINE TECHNIQUE: CT imaging of the lumbar spine was performed after Isovue 200M contrast administration. Multiplanar CT image reconstructions were also generated. RADIATION DOSE REDUCTION: This exam was performed according to the departmental dose-optimization program which includes automated exposure control, adjustment of the mA and/or kV according to patient size and/or use of iterative reconstruction technique. COMPARISON:  12/24/2022 FINDINGS: Contrast injection described separately. Segmentation:  5 lumbar type vertebral bodies assumed. Alignment: No significant malalignment. Vertebrae: No fracture or focal bone lesion. Conus medullaris: Extends to the L1-2 level and appears normal. Paraspinal and other soft tissues: Aortic atherosclerosis. Disc levels: L1-2: Bulging of the disc. Facet and ligamentous hypertrophy. Mild canal stenosis but no likely central compression. Foraminal narrowing left more than right could affect  the exiting L1 nerves. L2-3: Endplate osteophytes and bulging of the disc. Facet and ligamentous hypertrophy. Moderate multifactorial spinal stenosis. Bilateral foraminal stenosis. L3-4: Endplate osteophytes and shallow protrusion of the disc. Facet and ligamentous hypertrophy. Severe multifactorial canal and foraminal stenosis. L4-5: Endplate osteophytes and protrusion of the disc. Pronounced facet and ligamentous hypertrophy. Severe multifactorial spinal stenosis and foraminal stenosis. L5-S1: Endplate osteophytes. Facet and ligamentous hypertrophy. Narrowing of the subarticular lateral recesses left more than right. Bilateral foraminal stenosis that could affect the exiting L5 nerves, worse on the left. IMPRESSION: 1. Advanced chronic degenerative changes throughout the lumbar region as outlined above. Severe multifactorial spinal stenosis at L3-4 and L4-5. Moderate multifactorial spinal stenosis at L2-3. Lateral recess stenosis at L5-S1 left more than right. Foraminal stenosis at L1-2, L2-3, L3-4 and L4-5 and at L5-S1. Aortic Atherosclerosis (ICD10-I70.0). Electronically Signed   By: Paulina Fusi M.D.   On: 12/27/2022 12:03   CT THORACIC SPINE W CONTRAST  Result Date: 12/27/2022 CLINICAL DATA:  Myelopathy EXAM: CT MYELOGRAPHY THORACIC SPINE TECHNIQUE: CT imaging of the thoracic spine was performed after intrathecal contrast administration. Multiplanar CT image reconstructions were also generated. RADIATION DOSE REDUCTION: This exam was performed according to the departmental dose-optimization program which includes automated exposure control, adjustment of the mA and/or kV according to patient size and/or use of iterative reconstruction technique. COMPARISON:  CT 3 days ago FINDINGS: Contrast injection described separately. Alignment:  Normal Vertebrae: No fracture or focal bone lesion. Cord: No spinal cord compression or visible cord lesion by myelography. Normal cord diameter. Paraspinal and other soft  tissues:  Dependent pulmonary atelectasis. Disc levels: No significant disc level pathology. No disc herniation or compressive stenosis of the canal or foramina. Ordinary facet osteoarthritis, typical for age, arguably less advanced than often seen. IMPRESSION: 1. No spinal cord compression or visible cord lesion by myelography. 2. Ordinary facet osteoarthritis, typical for age, arguably less advanced than often seen. No disc herniation or compressive stenosis of the canal or foramina. Electronically Signed   By: Paulina Fusi M.D.   On: 12/27/2022 11:58               LOS: 3 days   Larya Charpentier  [facility]   Pager on [email or web address]. If 7PM-7AM, please contact night-coverage at [email or web address]  12/28/2022, 12:22 PM

## 2022-12-28 NOTE — Progress Notes (Signed)
Occupational Therapy Treatment Patient Details Name: Tristan Gardner MRN: [id number] DOB: April 23, 1935 Today's Date: 12/28/2022   History of present illness The pt is an 88 yo male presenting 6/22 with inability to bear weight in L or R LE, frequent falls, R sided facial droop, and intermittent ataxia. CT showed no acute abnormality, pt unable to get MRI. PMH includes: afib, CAD, HTN, NSTEMI, CVA, sciatica, chronic pain, and obesity.   OT comments  Pt feeling good today, feels that BLEs are much stronger than at admission. Pt able to stand with min A with instruction to use B hands to push from arm rests from recliner. Pt requires increased assistance when attempting to use one arm to push up to stand. Pt able to take steps forward/backwards with min A, posterior lean requiring physical assist to maintain balance. Pt instructed on use of sock aide/reacher/dressing stick for don/doff socks, good carryover with learned skills, minimal instruction necessary. Pt requires increased time with sock aide but is able to complete without physical assist. Pt would benefit greatly from continued skilled therapy to continue to instruct on compensatory strategies and improve functional strength. DC plan still appropriate.   Recommendations for follow up therapy are one component of a multi-disciplinary discharge planning process, led by the attending physician.  Recommendations may be updated based on patient status, additional functional criteria and insurance authorization.    Assistance Recommended at Discharge Frequent or constant Supervision/Assistance  Patient can return home with the following  Two people to help with walking and/or transfers;Two people to help with bathing/dressing/bathroom;Assistance with cooking/housework;Direct supervision/assist for medications management;Direct supervision/assist for financial management;Assist for transportation   Equipment Recommendations  Other (comment)     Recommendations for Other Services      Precautions / Restrictions Precautions Precautions: Fall Precaution Comments: pt has had ems come two times to assist with falls recently, also fell day of admission Restrictions Weight Bearing Restrictions: No       Mobility Bed Mobility               General bed mobility comments: arrived with Pt in chair    Transfers Overall transfer level: Needs assistance Equipment used: Rolling walker (2 wheels) Transfers: Sit to/from Stand Sit to Stand: Min assist           General transfer comment: able to perform STS from recliner with min A pushing with B hands from arm rests. Pt requires more assistance when pushing up with only one hand     Balance Overall balance assessment: Needs assistance Sitting-balance support: Feet supported, Bilateral upper extremity supported Sitting balance-Leahy Scale: Good Sitting balance - Comments: sitting on recliner performing LB ADLs   Standing balance support: Bilateral upper extremity supported, Reliant on assistive device for balance, During functional activity Standing balance-Leahy Scale: Poor Standing balance comment: reliant on RW                           ADL either performed or assessed with clinical judgement   ADL Overall ADL's : Needs assistance/impaired Eating/Feeding: Set up;Sitting                   Lower Body Dressing: Minimal assistance;Sitting/lateral leans;With adaptive equipment                 General ADL Comments: Pt instructed on use of sock aide/reacher and dressing stick, able to perform LB dressing with increased time.good carryover of learned skills  Extremity/Trunk Assessment Upper Extremity Assessment Upper Extremity Assessment: Overall WFL for tasks assessed            Vision       Perception     Praxis      Cognition Arousal/Alertness: Awake/alert Behavior During Therapy: WFL for tasks assessed/performed Overall  Cognitive Status: No family/caregiver present to determine baseline cognitive functioning                                 General Comments: HOH but good recall and carryover of learned skills, good critical thinking        Exercises      Shoulder Instructions       General Comments HR increasing to 129 with mobility, decreasing to 80s with seated rest break    Pertinent Vitals/ Pain       Pain Assessment Pain Assessment: No/denies pain  Home Living                                          Prior Functioning/Environment              Frequency  Min 2X/week        Progress Toward Goals  OT Goals(current goals can now be found in the care plan section)  Progress towards OT goals: Progressing toward goals  Acute Rehab OT Goals Patient Stated Goal: to improve BLE strength OT Goal Formulation: With patient Time For Goal Achievement: 01/08/23 Potential to Achieve Goals: Fair ADL Goals Pt Will Perform Grooming: with min assist;standing Pt Will Perform Lower Body Bathing: with min assist;sit to/from stand Pt Will Perform Lower Body Dressing: sit to/from stand;with mod assist Pt Will Transfer to Toilet: with min assist;bedside commode Pt Will Perform Toileting - Clothing Manipulation and hygiene: with min assist;sit to/from stand  Plan Discharge plan remains appropriate    Co-evaluation                 AM-PAC OT "6 Clicks" Daily Activity     Outcome Measure   Help from another person eating meals?: None Help from another person taking care of personal grooming?: None Help from another person toileting, which includes using toliet, bedpan, or urinal?: A Lot Help from another person bathing (including washing, rinsing, drying)?: A Lot Help from another person to put on and taking off regular upper body clothing?: A Little Help from another person to put on and taking off regular lower body clothing?: A Little 6 Click Score:  18    End of Session Equipment Utilized During Treatment: Gait belt;Rolling walker (2 wheels)  OT Visit Diagnosis: Unsteadiness on feet (R26.81);Other abnormalities of gait and mobility (R26.89);Repeated falls (R29.6);Pain;Other symptoms and signs involving the nervous system (R29.898) Pain - Right/Left: Left Pain - part of body: Leg   Activity Tolerance Patient tolerated treatment well   Patient Left in chair;with call bell/phone within reach;with chair alarm set   Nurse Communication Mobility status        Time: 8119-1478 OT Time Calculation (min): 30 min  Charges: OT General Charges $OT Visit: 1 Visit OT Treatments $Self Care/Home Management : 8-22 mins $Therapeutic Activity: 8-22 mins  [place], OTR/L   Alexis Goodell 12/28/2022, 6:37 PM

## 2022-12-28 NOTE — Care Management Important Message (Signed)
Important Message  Patient Details  Name: Tristan Gardner MRN: [id number] Date of Birth: [birth year]   Medicare Important Message Given:  Yes     Radha Coggins 12/28/2022, 2:22 PM

## 2022-12-28 NOTE — Progress Notes (Signed)
Physical Therapy Treatment Patient Details Name: Tristan Gardner MRN: [id number] DOB: 19-Dec-1934 Today's Date: 12/28/2022   History of Present Illness The pt is an 88 yo male presenting 6/22 with inability to bear weight in L or R LE, frequent falls, R sided facial droop, and intermittent ataxia. CT showed no acute abnormality, pt unable to get MRI. PMH includes: afib, CAD, HTN, NSTEMI, CVA, sciatica, chronic pain, and obesity.    PT Comments    Pt tolerated today's session well, able to progress to ambulation in the room today with RW and min-modA. Pt requiring modA to power up into standing from elevated bed, but progressing to minA from the chair with use of B arm rests. Pt ambulated two trials in the room with a seated rest break in-between, noted HR increasing to ~129, decreasing to the 80s with a seated rest break, RN aware. Pt will continue to benefit from skilled acute PT to progress mobility, strength, and balance. Discharge recommendations remain appropriate, will continue to follow.     Recommendations for follow up therapy are one component of a multi-disciplinary discharge planning process, led by the attending physician.  Recommendations may be updated based on patient status, additional functional criteria and insurance authorization.  Follow Up Recommendations  Can patient physically be transported by private vehicle: No    Assistance Recommended at Discharge Frequent or constant Supervision/Assistance  Patient can return home with the following A lot of help with walking and/or transfers;A little help with bathing/dressing/bathroom;Assistance with cooking/housework;Assist for transportation;Help with stairs or ramp for entrance   Equipment Recommendations  Rolling walker (2 wheels) (continue to assess)    Recommendations for Other Services       Precautions / Restrictions Precautions Precautions: Fall Precaution Comments: pt has had ems come two times to assist with  falls recently, also fell day of admission Restrictions Weight Bearing Restrictions: No     Mobility  Bed Mobility Overal bed mobility: Needs Assistance Bed Mobility: Supine to Sit     Supine to sit: Min assist, HOB elevated     General bed mobility comments: minA to assist trunk into sitting, use of bed rail and increased time to pivot hips to EOB    Transfers Overall transfer level: Needs assistance Equipment used: Rolling walker (2 wheels) Transfers: Sit to/from Stand Sit to Stand: Mod assist, From elevated surface, Min assist           General transfer comment: modA to power up into standing from elevated bed, progressing to minA when standing from chair, utilizing B arm rests to power up.    Ambulation/Gait Ambulation/Gait assistance: Min assist, Mod assist Gait Distance (Feet): 12 Feet (6 feet first trial) Assistive device: Rolling walker (2 wheels) Gait Pattern/deviations: Step-to pattern, Decreased dorsiflexion - left, Decreased weight shift to left, Knee flexed in stance - left, Ataxic, Trunk flexed Gait velocity: decreased     General Gait Details: decreased gait speed with ataxic gait on LLE with increased time and effort for advancement and ensuring placement prior to L stance phase. Cued for upright posture and increased proximity of RW, assist for balance and support   Stairs             Wheelchair Mobility    Modified Rankin (Stroke Patients Only)       Balance Overall balance assessment: Needs assistance Sitting-balance support: Feet supported, Bilateral upper extremity supported Sitting balance-Leahy Scale: Fair     Standing balance support: Bilateral upper extremity supported, Reliant on assistive device  for balance, During functional activity Standing balance-Leahy Scale: Poor Standing balance comment: reliant on RW                            Cognition Arousal/Alertness: Awake/alert Behavior During Therapy: WFL for  tasks assessed/performed Overall Cognitive Status: No family/caregiver present to determine baseline cognitive functioning                                 General Comments: HOH but able to follow commands once he hears them        Exercises      General Comments General comments (skin integrity, edema, etc.): HR increasing to 129 with mobility, decreasing to 80s with seated rest break      Pertinent Vitals/Pain Pain Assessment Pain Assessment: Faces Faces Pain Scale: Hurts a little bit Pain Location: LLE Pain Descriptors / Indicators: Tingling Pain Intervention(s): Limited activity within patient's tolerance, Monitored during session, Repositioned    Home Living                          Prior Function            PT Goals (current goals can now be found in the care plan section) Acute Rehab PT Goals Patient Stated Goal: return to independence and strength prior to return home PT Goal Formulation: With patient Time For Goal Achievement: 01/07/23 Potential to Achieve Goals: Good Progress towards PT goals: Progressing toward goals    Frequency    Min 4X/week      PT Plan Current plan remains appropriate    Co-evaluation              AM-PAC PT "6 Clicks" Mobility   Outcome Measure  Help needed turning from your back to your side while in a flat bed without using bedrails?: A Little Help needed moving from lying on your back to sitting on the side of a flat bed without using bedrails?: A Lot Help needed moving to and from a bed to a chair (including a wheelchair)?: A Lot Help needed standing up from a chair using your arms (e.g., wheelchair or bedside chair)?: A Lot Help needed to walk in hospital room?: Total Help needed climbing 3-5 steps with a railing? : Total 6 Click Score: 11    End of Session Equipment Utilized During Treatment: Gait belt Activity Tolerance: Patient tolerated treatment well Patient left: with call  bell/phone within reach;in chair;with chair alarm set Nurse Communication: Mobility status PT Visit Diagnosis: Unsteadiness on feet (R26.81);Other abnormalities of gait and mobility (R26.89);Muscle weakness (generalized) (M62.81)     Time: 6962-9528 PT Time Calculation (min) (ACUTE ONLY): 20 min  Charges:  $Gait Training: 8-22 mins                     Lindalou Hose, PT DPT Acute Rehabilitation Services Office [phone]    [name] 12/28/2022, 4:34 PM

## 2022-12-28 NOTE — Progress Notes (Signed)
Yellow MEWS noted on patient with heart rate at 120's, patient was moving on bed at time vital signs were taken fixing his sheets, once rested, his heart went back to 80-90 bpm and respirations to 16-20 bpm. Janie Charge RN  and Odie Sera MD updated. No interventions at the moment. Patient verbalized no pain or palpitations.    12/28/22 2330 12/28/22 2337  Assess: MEWS Score  Temp 97.6 F (36.4 C)  --   BP 134/84  --   MAP (mmHg) 99  --   Pulse Rate 84 93  ECG Heart Rate (!) 126 91  Resp (!) 25 20  SpO2 96 % 100 %  O2 Device Room Air  --   Assess: MEWS Score  MEWS Temp 0 0  MEWS Systolic 0 0  MEWS Pulse 2 0  MEWS RR 1 0  MEWS LOC 0 0  MEWS Score 3 0  MEWS Score Color Yellow Green  Assess: if the MEWS score is Yellow or Red  Were vital signs taken at a resting state? No  --   Focused Assessment No change from prior assessment  --   Does the patient meet 2 or more of the SIRS criteria? No  --   MEWS guidelines implemented  No, vital signs rechecked  --   Notify: Charge Nurse/RN  Name of Charge Nurse/RN Notified janie RN  --   Provider Notification  Provider Name/Title Odie Sera  --   Date Provider Notified 12/28/22  --   Time Provider Notified 2338  --   Method of Notification Page  --   Notification Reason  (yellow MEWS)  --   Provider response No new orders  --   Date of Provider Response 12/28/22  --   Time of Provider Response 2342  --   Assess: SIRS CRITERIA  SIRS Temperature  0 0  SIRS Pulse 1 1  SIRS Respirations  1 0  SIRS WBC 0 0  SIRS Score Sum  2 1

## 2022-12-29 DIAGNOSIS — K769 Liver disease, unspecified: Secondary | ICD-10-CM | POA: Diagnosis present

## 2022-12-29 DIAGNOSIS — R7303 Prediabetes: Secondary | ICD-10-CM | POA: Diagnosis present

## 2022-12-29 DIAGNOSIS — M48062 Spinal stenosis, lumbar region with neurogenic claudication: Secondary | ICD-10-CM

## 2022-12-29 DIAGNOSIS — E785 Hyperlipidemia, unspecified: Secondary | ICD-10-CM | POA: Diagnosis present

## 2022-12-29 DIAGNOSIS — K0889 Other specified disorders of teeth and supporting structures: Secondary | ICD-10-CM | POA: Clinically undetermined

## 2022-12-29 DIAGNOSIS — E6609 Other obesity due to excess calories: Secondary | ICD-10-CM

## 2022-12-29 MED ORDER — ISOSORBIDE MONONITRATE ER 30 MG PO TB24
30.0000 mg | ORAL_TABLET | Freq: Every day | ORAL | Status: DC
  Administered 2022-12-29 – 2023-01-06 (×7): 30 mg via ORAL
  Filled 2022-12-29 (×8): qty 1

## 2022-12-29 MED ORDER — LOSARTAN POTASSIUM 50 MG PO TABS
25.0000 mg | ORAL_TABLET | Freq: Every day | ORAL | Status: DC
  Administered 2022-12-29 – 2023-01-06 (×7): 25 mg via ORAL
  Filled 2022-12-29 (×8): qty 1

## 2022-12-29 MED ORDER — TRAZODONE HCL 50 MG PO TABS
50.0000 mg | ORAL_TABLET | Freq: Every day | ORAL | Status: DC
  Administered 2022-12-29 – 2023-01-05 (×8): 50 mg via ORAL
  Filled 2022-12-29 (×8): qty 1

## 2022-12-29 MED ORDER — DULOXETINE HCL 30 MG PO CPEP
30.0000 mg | ORAL_CAPSULE | Freq: Two times a day (BID) | ORAL | Status: DC
  Administered 2022-12-29 – 2023-01-06 (×15): 30 mg via ORAL
  Filled 2022-12-29 (×15): qty 1

## 2022-12-29 MED ORDER — BENZOCAINE 10 % MT GEL
Freq: Four times a day (QID) | OROMUCOSAL | Status: DC | PRN
  Administered 2022-12-29: 1 via OROMUCOSAL
  Filled 2022-12-29: qty 9

## 2022-12-29 NOTE — Plan of Care (Signed)

## 2022-12-29 NOTE — Progress Notes (Signed)
Physical Therapy Treatment Patient Details Name: Tristan Gardner MRN: [id number] DOB: [birth year] Today's Date: 12/29/2022   History of Present Illness The pt is an 88 yo male presenting 6/22 with inability to bear weight in L or R LE, frequent falls, R sided facial droop, and intermittent ataxia. CT showed no acute abnormality, pt unable to get MRI. PMH includes: afib, CAD, HTN, NSTEMI, CVA, sciatica, chronic pain, and obesity.    PT Comments    Pt tolerated today's session well, remaining motivated to work with therapy and progress back to his baseline. Pt required modA to power up into standing with first trial from the bed, improving to minA-minG from chair with BUE use on the arm rails. Pt able to increase ambulation distance slightly today, requiring minA and use of RW, heavy reliance on BUE, with limited stance phase on LLE, pt reports numbness throughout the L foot. Acute PT will continue to follow up with pt to progress mobility, discharge recommendations remain appropriate.     Recommendations for follow up therapy are one component of a multi-disciplinary discharge planning process, led by the attending physician.  Recommendations may be updated based on patient status, additional functional criteria and insurance authorization.  Follow Up Recommendations  Can patient physically be transported by private vehicle: No    Assistance Recommended at Discharge Frequent or constant Supervision/Assistance  Patient can return home with the following A lot of help with walking and/or transfers;A little help with bathing/dressing/bathroom;Assistance with cooking/housework;Assist for transportation;Help with stairs or ramp for entrance   Equipment Recommendations  Rolling walker (2 wheels) (continue to assess)    Recommendations for Other Services       Precautions / Restrictions Precautions Precautions: Fall Precaution Comments: pt has had ems come two times to assist with falls recently,  also fell day of admission Restrictions Weight Bearing Restrictions: No     Mobility  Bed Mobility Overal bed mobility: Needs Assistance Bed Mobility: Supine to Sit     Supine to sit: HOB elevated, Supervision     General bed mobility comments: supervision for safety with use of bed rail    Transfers Overall transfer level: Needs assistance Equipment used: Rolling walker (2 wheels) Transfers: Sit to/from Stand Sit to Stand: Min assist, Mod assist, Min guard           General transfer comment: modA to stand from the bed, progressing to minA-minG with stands from the chair. Cued pt to utilize BUE to power up    Ambulation/Gait Ambulation/Gait assistance: Min assist Gait Distance (Feet): 18 Feet Assistive device: Rolling walker (2 wheels) Gait Pattern/deviations: Step-to pattern, Decreased dorsiflexion - left, Decreased weight shift to left, Knee flexed in stance - left, Ataxic, Trunk flexed Gait velocity: decreased     General Gait Details: decreased gait speed with ataxic gait on LLE and increased time and effort for advancement and ensuring placement prior to L stance phase. Cued for upright posture and increased proximity of RW, assist for balance and support, pt with increased trunk flexion and downward gaze   Stairs             Wheelchair Mobility    Modified Rankin (Stroke Patients Only)       Balance Overall balance assessment: Needs assistance Sitting-balance support: Feet supported, Bilateral upper extremity supported Sitting balance-Leahy Scale: Good     Standing balance support: Bilateral upper extremity supported, Reliant on assistive device for balance, During functional activity Standing balance-Leahy Scale: Poor Standing balance comment: reliant on RW  Cognition Arousal/Alertness: Awake/alert Behavior During Therapy: WFL for tasks assessed/performed Overall Cognitive Status: No family/caregiver  present to determine baseline cognitive functioning                                 General Comments: HOH but good recall and carryover of learned skills, pleasant throughout session        Exercises Other Exercises Other Exercises: sit<>stand x5 reps from chair    General Comments General comments (skin integrity, edema, etc.): HR increasing to 120s with mobility, recovers well with seated rest breaks      Pertinent Vitals/Pain Pain Assessment Pain Assessment: Faces Faces Pain Scale: Hurts a little bit Pain Location: LLE Pain Descriptors / Indicators: Tingling, Numbness Pain Intervention(s): Limited activity within patient's tolerance, Monitored during session, Repositioned    Home Living                          Prior Function            PT Goals (current goals can now be found in the care plan section) Acute Rehab PT Goals Patient Stated Goal: return to independence and strength prior to return home PT Goal Formulation: With patient Time For Goal Achievement: 01/07/23 Potential to Achieve Goals: Good Progress towards PT goals: Progressing toward goals    Frequency    Min 4X/week      PT Plan Current plan remains appropriate    Co-evaluation              AM-PAC PT "6 Clicks" Mobility   Outcome Measure  Help needed turning from your back to your side while in a flat bed without using bedrails?: A Little Help needed moving from lying on your back to sitting on the side of a flat bed without using bedrails?: A Little Help needed moving to and from a bed to a chair (including a wheelchair)?: A Lot Help needed standing up from a chair using your arms (e.g., wheelchair or bedside chair)?: A Lot Help needed to walk in hospital room?: A Lot Help needed climbing 3-5 steps with a railing? : Total 6 Click Score: 13    End of Session Equipment Utilized During Treatment: Gait belt Activity Tolerance: Patient tolerated treatment  well Patient left: with call bell/phone within reach;in chair;with chair alarm set Nurse Communication: Mobility status PT Visit Diagnosis: Unsteadiness on feet (R26.81);Other abnormalities of gait and mobility (R26.89);Muscle weakness (generalized) (M62.81)     Time: 5409-8119 PT Time Calculation (min) (ACUTE ONLY): 20 min  Charges:  $Therapeutic Activity: 8-22 mins                     Tristan Gardner, PT DPT Acute Rehabilitation Services Office [phone]    [name] 12/29/2022, 4:29 PM

## 2022-12-29 NOTE — Hospital Course (Signed)
88yo male with h/o afib on Xarelto, CAD, and CVA on Plavix who presented on 6/22 with fall, generalized weakness.  CT myelogram with severe lumbar stenosis, plan for surgery by Dr. Maurice Small on 7/2.  Xarelto and Plavix are on hold pending surgery.  He is likely to need SNF.

## 2022-12-29 NOTE — Plan of Care (Signed)
  Problem: Activity: Goal: Risk for activity intolerance will decrease Outcome: Not Progressing   Problem: Safety: Goal: Ability to remain free from injury will improve Outcome: Not Progressing   

## 2022-12-29 NOTE — TOC Progression Note (Signed)
Transition of Care [facility]) - Progression Note    Patient Details  Name: Tristan Gardner MRN: [id number] Date of Birth: 1935/01/22  [facility] CM/SW Contact  Mearl Latin, LCSW Phone Number: 12/29/2022, 9:45 AM  Clinical Narrative:    CSW continuing to follow for discharge needs once medically stable.    Expected Discharge Plan: Skilled Nursing Facility Barriers to Discharge: Continued Medical Work up, English as a second language teacher, SNF Pending bed offer  Expected Discharge Plan and Services In-house Referral: Clinical Social Work   Post Acute Care Choice: Skilled Nursing Facility Living arrangements for the past 2 months: Single Family Home                                       Social Determinants of Health (SDOH) Interventions SDOH Screenings   Tobacco Use: Medium Risk (12/28/2022)    Readmission Risk Interventions     No data to display

## 2022-12-29 NOTE — Progress Notes (Signed)
Progress Note   Patient: Tristan Gardner WUJ:[id number] DOB: 1934-07-17 DOA: 12/23/2022     4 DOS: the patient was seen and examined on 12/29/2022   Brief hospital course: 88yo male with h/o afib on Xarelto, CAD, and CVA on Plavix who presented on 6/22 with fall, generalized weakness.  CT myelogram with severe lumbar stenosis, plan for surgery by Dr. Maurice Small on 7/2.  Xarelto and Plavix are on hold pending surgery.  He is likely to need SNF.  Assessment and Plan:  Severe lumbar spinal stenosis History suggestive of L-spine disease with radiculopathy - chronic back pain with L > R LE weakness, pain No cord compression on CT thoracic, lumbar spine. CT myelogram showed severe lumbar spinal stenosis. Dr. Maurice Small, neurosurgeon, and plans to operate on the patient on Tuesday, January 02, 2023. Continue to hold Xarelto and Plavix in anticipation of neurosurgery. Use low-dose heparin for DVT prophylaxis for now. PT and OT recommended discharge to SNF Continue Baclofen, Cymbalta  Dental pain R-sided dental pain On exam, he has mostly absent dentition and the remaining teeth are broken There is no dental or oral surgery coverage available Will need outpatient f/u No apparent abscess on limited exam Orajel ordered as per patient request   Dysarthria Improved CT head unremarkable cannot do MRI due to short wounds and metal in his skull   7 mm hypodense lesion of the inferior aspect of the right hepatic lobe.   Outpatient follow-up with the PCP for right upper quadrant ultrasound    Bladder wall thickening Incidental finding on imaging No clinical evidence of UTI   Chronic A-fib Continue metoprolol for rate control CHADS2Vasc score >5 Xarelto on hold as above   History of peripheral neuropathy Continue Neurontin   Hypertension Continue Imdur, losartan, Toprol XL  HLD Continue Lipitor  Prediabetes A1c 6.1 Monitor with fasting glucose for now Needs outpatient f/u    Obesity Body mass index is 34.17 kg/m.Marland Kitchen  Weight loss should be encouraged Outpatient PCP/bariatric medicine f/u encouraged     Subjective: His only complaint today was R-sided dental pain.  He has mostly absent dentition with broken remaining teeth.  No frank infection.  Physical Exam: Vitals:   12/29/22 0837 12/29/22 1201 12/29/22 1441 12/29/22 1536  BP: 127/81 111/62 130/73 (!) 120/50  Pulse: 83 61  64  Resp: 13 16  20   Temp: 97.9 F (36.6 C) 98.4 F (36.9 C)  98.2 F (36.8 C)  TempSrc: Oral Oral  Oral  SpO2: 99% 100%  98%  Weight:      Height:       General:  Appears calm and comfortable and is in NAD Eyes:  EOMI, normal lids, iris ENT:  grossly normal hearing, lips & tongue, mmm; poor dentition with both upper and lower broken molars on the R with remaining teeth Neck:  no LAD, masses or thyromegaly Cardiovascular:  RRR, no m/r/g. No LE edema.  Respiratory:   CTA bilaterally with no wheezes/rales/rhonchi.  Normal respiratory effort. Abdomen:  soft, NT, ND Skin:  no rash or induration seen on limited exam Musculoskeletal:   no bony abnormality Psychiatric:  blunted mood and affect, speech fluent and appropriate, AOx3 Neurologic:  CN 2-12 grossly intact, moves all extremities in coordinated fashion   Radiological Exams on Admission: Independently reviewed - see discussion in A/P where applicable  No results found.   Pertinent labs:    CO2 18 Glucose 128 A1c 6.1   Family Communication: None present; his girlfriend was on facetime with  him throughout the evaluation  Disposition: Status is: Inpatient Remains inpatient appropriate because: needs lumbar surgery on 7/2  Planned Discharge Destination: Skilled nursing facility    Time spent: 35 minutes  Author: Jonah Blue, MD 12/29/2022 4:46 PM  For on call review [email or web address].

## 2022-12-30 DIAGNOSIS — M48062 Spinal stenosis, lumbar region with neurogenic claudication: Secondary | ICD-10-CM | POA: Diagnosis not present

## 2022-12-30 LAB — CBC WITH DIFFERENTIAL/PLATELET
Abs Immature Granulocytes: 0.01 10*3/uL (ref 0.00–0.07)
Basophils Absolute: 0 10*3/uL (ref 0.0–0.1)
Basophils Relative: 0 %
Eosinophils Absolute: 0.1 10*3/uL (ref 0.0–0.5)
Eosinophils Relative: 2 %
HCT: 32.4 % — ABNORMAL LOW (ref 39.0–52.0)
Hemoglobin: 10.2 g/dL — ABNORMAL LOW (ref 13.0–17.0)
Immature Granulocytes: 0 %
Lymphocytes Relative: 32 %
Lymphs Abs: 1.2 10*3/uL (ref 0.7–4.0)
MCH: 25.4 pg — ABNORMAL LOW (ref 26.0–34.0)
MCHC: 31.5 g/dL (ref 30.0–36.0)
MCV: 80.6 fL (ref 80.0–100.0)
Monocytes Absolute: 0.6 10*3/uL (ref 0.1–1.0)
Monocytes Relative: 15 %
Neutro Abs: 2 10*3/uL (ref 1.7–7.7)
Neutrophils Relative %: 51 %
Platelets: 231 10*3/uL (ref 150–400)
RBC: 4.02 MIL/uL — ABNORMAL LOW (ref 4.22–5.81)
RDW: 21.7 % — ABNORMAL HIGH (ref 11.5–15.5)
WBC: 3.9 10*3/uL — ABNORMAL LOW (ref 4.0–10.5)
nRBC: 0 % (ref 0.0–0.2)

## 2022-12-30 LAB — BASIC METABOLIC PANEL
Anion gap: 11 (ref 5–15)
BUN: 12 mg/dL (ref 8–23)
CO2: 23 mmol/L (ref 22–32)
Calcium: 8.6 mg/dL — ABNORMAL LOW (ref 8.9–10.3)
Chloride: 102 mmol/L (ref 98–111)
Creatinine, Ser: 1.01 mg/dL (ref 0.61–1.24)
GFR, Estimated: >60 mL/min (ref >60)
Glucose, Bld: 118 mg/dL — ABNORMAL HIGH (ref 70–99)
Potassium: 3.9 mmol/L (ref 3.5–5.1)
Sodium: 136 mmol/L (ref 135–145)

## 2022-12-30 NOTE — Progress Notes (Signed)
Progress Note   Patient: Tristan Gardner [id number]:[id number] DOB: 1935/02/27 DOA: 12/23/2022     5 DOS: the patient was seen and examined on 12/30/2022   Brief hospital course: 88yo male with h/o afib on Xarelto, CAD, and CVA on Plavix who presented on 6/22 with fall, generalized weakness.  CT myelogram with severe lumbar stenosis, plan for surgery by Dr. Maurice Small on 7/2.  Xarelto and Plavix are on hold pending surgery.  He is likely to need SNF.  Assessment and Plan:  Severe lumbar spinal stenosis History suggestive of L-spine disease with radiculopathy - chronic back pain with L > R LE weakness, pain No cord compression on CT thoracic, lumbar spine. CT myelogram showed severe lumbar spinal stenosis. Dr. Maurice Small, neurosurgeon, and plans to operate on the patient on Tuesday, January 02, 2023. Continue to hold Xarelto and Plavix in anticipation of neurosurgery. Use low-dose heparin for DVT prophylaxis for now. PT and OT currently recommend discharge to SNF Continue Baclofen, Cymbalta   Dental pain R-sided dental pain On exam on 6/28, he has mostly absent dentition and the remaining teeth are broken There is no dental or oral surgery coverage available Will need outpatient f/u Orajel ordered as per patient request and he reports improvement   Dysarthria Improved CT head unremarkable cannot do MRI due to short wounds and metal in his skull   7 mm hypodense lesion of the inferior aspect of the right hepatic lobe.   Outpatient follow-up with the PCP for right upper quadrant ultrasound    Bladder wall thickening Incidental finding on imaging No clinical evidence of UTI   Chronic A-fib Continue metoprolol for rate control CHADS2Vasc score >5 Xarelto on hold as above   History of peripheral neuropathy Continue Neurontin   Hypertension Continue Imdur, losartan, Toprol XL   HLD Continue Lipitor   Prediabetes A1c 6.1 Monitor with fasting glucose for now Needs outpatient f/u    Obesity Body mass index is 34.17 kg/m.Marland Kitchen  Weight loss should be encouraged Outpatient PCP/bariatric medicine f/u encouraged    Subjective: Reports dental pain is better with Orajel.  Today, he complains of R upper arm pain.  No obvious injury.  Physical Exam: Vitals:   12/29/22 1732 12/29/22 1951 12/29/22 2329 12/30/22 0341  BP: (!) 139/101 (!) 95/46 (!) 108/54 127/63  Pulse:      Resp:      Temp:  97.6 F (36.4 C) 98.2 F (36.8 C) 98.4 F (36.9 C)  TempSrc:  Oral Oral Oral  SpO2:      Weight:      Height:       General:  Appears calm and comfortable and is in NAD Eyes:   EOMI, normal lids, iris ENT:  grossly normal hearing, lips & tongue, mmm; poor/absent dentition Neck:  no LAD, masses or thyromegaly Cardiovascular:  RRR, no m/r/g. No LE edema.  Respiratory:   CTA bilaterally with no wheezes/rales/rhonchi.  Normal respiratory effort. Abdomen:  soft, NT, ND Skin:  no rash or induration seen on limited exam Musculoskeletal:  LLE weakness, R lateral upper arm TTP without apparent abnormality Psychiatric:  grossly normal mood and affect, speech fluent and appropriate, AOx3 Neurologic:  CN 2-12 grossly intact, moves all extremities in coordinated fashion    Pertinent labs:    Glucose 118 WBC 3.9 Hgb 10.2   Family Communication: None present, discussed with girlfriend on 6/28  Disposition: Status is: Inpatient Remains inpatient appropriate because: awaiting surgery on 7/2  Planned Discharge Destination: Skilled nursing facility  Time spent: 25 minutes  Author: Jonah Blue, MD 12/30/2022 7:54 AM  For on call review [email or web address].

## 2022-12-30 NOTE — Plan of Care (Signed)

## 2022-12-30 NOTE — Plan of Care (Signed)
?  Problem: Safety: ?Goal: Ability to remain free from injury will improve ?Outcome: Not Progressing ?  ?Problem: Activity: ?Goal: Risk for activity intolerance will decrease ?Outcome: Not Progressing ?  ?

## 2022-12-31 DIAGNOSIS — M48062 Spinal stenosis, lumbar region with neurogenic claudication: Secondary | ICD-10-CM | POA: Diagnosis not present

## 2022-12-31 MED ORDER — ENOXAPARIN SODIUM 40 MG/0.4ML IJ SOSY
40.0000 mg | PREFILLED_SYRINGE | INTRAMUSCULAR | Status: DC
  Administered 2022-12-31 – 2023-01-01 (×2): 40 mg via SUBCUTANEOUS
  Filled 2022-12-31 (×2): qty 0.4

## 2022-12-31 NOTE — Progress Notes (Signed)
Mobility Specialist Progress Note    12/31/22 1237  Mobility  Activity Ambulated with assistance in hallway  Level of Assistance Minimal assist, patient does 75% or more  Assistive Device Front wheel walker  Distance Ambulated (ft) 60 ft (30+30)  Activity Response Tolerated well  Mobility Referral Yes  $Mobility charge 1 Mobility  Mobility Specialist Start Time (ACUTE ONLY) 1220  Mobility Specialist Stop Time (ACUTE ONLY) 1235  Mobility Specialist Time Calculation (min) (ACUTE ONLY) 15 min   Pt received in chair and  agreeable. No complaints. After ambulation bouts, completed x2 STS from chair with BUE support and x3 STS without BUE support. Left with call bell in reach. RN notified.    [place] Mobility Specialist  Please Neurosurgeon or Rehab Office at [phone]

## 2022-12-31 NOTE — Progress Notes (Signed)
Progress Note   Patient: Tristan Gardner WUJ:[id number] DOB: 02/03/1935 DOA: 12/23/2022     6 DOS: the patient was seen and examined on 12/31/2022   Brief hospital course: 88yo male with h/o afib on Xarelto, CAD, and CVA on Plavix who presented on 6/22 with fall, generalized weakness.  CT myelogram with severe lumbar stenosis, plan for surgery by Dr. Maurice Small on 7/2.  Xarelto and Plavix are on hold pending surgery.  He is likely to need SNF.  Assessment and Plan:  Severe lumbar spinal stenosis History suggestive of L-spine disease with radiculopathy - chronic back pain with L > R LE weakness, pain No cord compression on CT thoracic, lumbar spine. CT myelogram showed severe lumbar spinal stenosis. Dr. Maurice Small, neurosurgeon, and plans to operate on the patient on Tuesday, January 02, 2023. Continue to hold Xarelto and Plavix in anticipation of neurosurgery. Use low-dose heparin for DVT prophylaxis for now. PT and OT currently recommend discharge to SNF Continue Baclofen, Cymbalta   Dental pain R-sided dental pain On exam on 6/28, he has mostly absent dentition and the remaining teeth are broken There is no dental or oral surgery coverage available Will need outpatient f/u Orajel ordered as per patient request and he reports improvement   Dysarthria Resolved CT head unremarkable cannot do MRI due to short wounds and metal in his skull   7 mm hypodense lesion of the inferior aspect of the right hepatic lobe.   Outpatient follow-up with the PCP for right upper quadrant ultrasound    Bladder wall thickening Incidental finding on imaging No clinical evidence of UTI   Chronic A-fib Continue metoprolol for rate control CHADS2Vasc score >5 Xarelto on hold as above   History of peripheral neuropathy Continue Neurontin   Hypertension Continue Imdur, losartan, Toprol XL   HLD Continue Lipitor   Prediabetes A1c 6.1 Monitor with fasting glucose for now Needs outpatient f/u    Obesity Body mass index is 34.17 kg/m.Marland Kitchen  Weight loss should be encouraged Outpatient PCP/bariatric medicine f/u encouraged    Subjective: No complaints today.  Was up and walked to the door and sat in a chair for 4 hours yesterday.  Physical Exam: Vitals:   12/30/22 2123 12/30/22 2300 12/31/22 0525 12/31/22 0828  BP: 126/89 (!) 151/96 123/84 139/69  Pulse:  61  61  Resp:  17  18  Temp:  98 F (36.7 C)  98.2 F (36.8 C)  TempSrc:  Oral  Oral  SpO2:  99%  98%  Weight:      Height:       General:  Appears calm and comfortable and is in NAD Eyes:   EOMI, normal lids, iris ENT:  grossly normal hearing, lips & tongue, mmm; poor/absent dentition Neck:  no LAD, masses or thyromegaly Cardiovascular:  RRR, no m/r/g. No LE edema.  Respiratory:   CTA bilaterally with no wheezes/rales/rhonchi.  Normal respiratory effort. Abdomen:  soft, NT, ND Skin:  no rash or induration seen on limited exam Musculoskeletal:  Left > Right LE weakness Psychiatric:  grossly normal mood and affect, speech fluent and appropriate, AOx3 Neurologic:  CN 2-12 grossly intact, moves all extremities in coordinated fashion   Pertinent labs:    None today   Family Communication: None present; he declined to have me call family  Disposition: Status is: Inpatient Remains inpatient appropriate because: surgery on 7/2  Planned Discharge Destination: Skilled nursing facility    Time spent: 25 minutes  Author: Jonah Blue, MD 12/31/2022 10:44 AM  For on call review [email or web address].

## 2023-01-01 ENCOUNTER — Other Ambulatory Visit: Payer: Self-pay | Admitting: Neurological Surgery

## 2023-01-01 DIAGNOSIS — K769 Liver disease, unspecified: Secondary | ICD-10-CM

## 2023-01-01 DIAGNOSIS — M48061 Spinal stenosis, lumbar region without neurogenic claudication: Secondary | ICD-10-CM | POA: Diagnosis not present

## 2023-01-01 DIAGNOSIS — I482 Chronic atrial fibrillation, unspecified: Secondary | ICD-10-CM | POA: Diagnosis not present

## 2023-01-01 DIAGNOSIS — Z8673 Personal history of transient ischemic attack (TIA), and cerebral infarction without residual deficits: Secondary | ICD-10-CM | POA: Diagnosis not present

## 2023-01-01 LAB — BASIC METABOLIC PANEL
Anion gap: 9 (ref 5–15)
BUN: 12 mg/dL (ref 8–23)
CO2: 24 mmol/L (ref 22–32)
Calcium: 8.4 mg/dL — ABNORMAL LOW (ref 8.9–10.3)
Chloride: 100 mmol/L (ref 98–111)
Creatinine, Ser: 0.97 mg/dL (ref 0.61–1.24)
GFR, Estimated: >60 mL/min (ref >60)
Glucose, Bld: 141 mg/dL — ABNORMAL HIGH (ref 70–99)
Potassium: 4.2 mmol/L (ref 3.5–5.1)
Sodium: 133 mmol/L — ABNORMAL LOW (ref 135–145)

## 2023-01-01 LAB — CBC WITH DIFFERENTIAL/PLATELET
Abs Immature Granulocytes: 0.01 10*3/uL (ref 0.00–0.07)
Basophils Absolute: 0 10*3/uL (ref 0.0–0.1)
Basophils Relative: 0 %
Eosinophils Absolute: 0.1 10*3/uL (ref 0.0–0.5)
Eosinophils Relative: 4 %
HCT: 32.4 % — ABNORMAL LOW (ref 39.0–52.0)
Hemoglobin: 10.1 g/dL — ABNORMAL LOW (ref 13.0–17.0)
Immature Granulocytes: 0 %
Lymphocytes Relative: 33 %
Lymphs Abs: 1.1 10*3/uL (ref 0.7–4.0)
MCH: 25.3 pg — ABNORMAL LOW (ref 26.0–34.0)
MCHC: 31.2 g/dL (ref 30.0–36.0)
MCV: 81 fL (ref 80.0–100.0)
Monocytes Absolute: 0.4 10*3/uL (ref 0.1–1.0)
Monocytes Relative: 12 %
Neutro Abs: 1.6 10*3/uL — ABNORMAL LOW (ref 1.7–7.7)
Neutrophils Relative %: 51 %
Platelets: 228 10*3/uL (ref 150–400)
RBC: 4 MIL/uL — ABNORMAL LOW (ref 4.22–5.81)
RDW: 21.2 % — ABNORMAL HIGH (ref 11.5–15.5)
WBC: 3.2 10*3/uL — ABNORMAL LOW (ref 4.0–10.5)
nRBC: 0 % (ref 0.0–0.2)

## 2023-01-01 NOTE — Progress Notes (Signed)
No OR time available for tomorrow, surgery therefore moved to Wednesday 7/3. Will d/c the patients NPO order.

## 2023-01-01 NOTE — Progress Notes (Signed)
PROGRESS NOTE        PATIENT DETAILS Name: Tristan Gardner Age: 88 y.o. Sex: male Date of Birth: 1935/03/30 Admit Date: 12/23/2022 Admitting Physician Frankey Shown, DO ZOX:[email or web address], [place], PA  Brief Summary: Patient is a 88 y.o.  male with history of A-fib on Xarelto, CVA on Plavix-who presented with fall/generalized weakness-upon further evaluation was found to have severe lumbar stenosis on CT myelogram.  Significant events: 6/22>> admit to [facility]  Significant studies: 6/22>> x-ray pelvis: No fracture 6/22 >>x-ray tibia/fibula: No fracture 6/22>> CT head: No acute intracranial abnormality 6/22>> CT C-spine: No fracture 6/23>> CT T/L-spine: Motion degraded study-no fracture/traumatic subluxation. 6/24>> echo: EF 50-55% 6/26>> CT myelography C-spine: No compressive central canal stenosis, degenerative disc disease. 6/26>> CT myelography T-spine: No cord compression 6/26>> CT myelography L-spine: Severe multifactorial spinal stenosis at L3-4 and L4-5.  Significant microbiology data: None  Procedures: 6/26>> CT myelogram  Consults: Neurology Neurosurgery  Subjective: Lying comfortably in bed-denies any chest pain or shortness of breath.  Objective: Vitals: Blood pressure 133/75, pulse 70, temperature 98 F (36.7 C), temperature source Oral, resp. rate 17, height 5\' 11"  (1.803 m), weight 111.1 kg, SpO2 98 %.   Exam: Gen Exam:Alert awake-not in any distress HEENT:atraumatic, normocephalic Chest: B/L clear to auscultation anteriorly CVS:S1S2 regular Abdomen:soft non tender, non distended Extremities:no edema Neurology: Non focal Skin: no rash  Pertinent Labs/Radiology:    Latest Ref Rng & Units 01/01/2023    4:56 AM 12/30/2022    3:35 AM 12/25/2022    4:52 AM  CBC  WBC 4.0 - 10.5 K/uL 3.2  3.9  4.0   Hemoglobin 13.0 - 17.0 g/dL 09.8  11.9  14.7   Hematocrit 39.0 - 52.0 % 32.4  32.4  33.2   Platelets 150 - 400 K/uL 228  231  183      Lab Results  Component Value Date   NA 133 (L) 01/01/2023   K 4.2 01/01/2023   CL 100 01/01/2023   CO2 24 01/01/2023      Assessment/Plan: Worsening back pain-LLE weakness Felt to be secondary to severe canal stenosis at L3-L4, and L4-L5 Neurosurgery planning decompressive laminectomy 7/2  Right-sided dental pain secondary to dental caries Better with Orajel Outpatient follow-up with primary dentist (unfortunately no dental or oral surgery coverage available at [facility]  Peripheral neuropathy Neurontin  Chronic atrial fibrillation Metoprolol for rate control Xarelto held for decompressive laminectomy 7/2  History of CVA Plavix on hold Continues to 10  HTN Stable Imdur/losartan/Toprol  HLD Lipitor  Prediabetes (A1c 6.1 on 6/24)  follow CBGs/fasting glucose  Bladder wall thickening Incidental finding on imaging studies No clinical symptomatology consistent with UTI  7 mm hypodense lesion in the inferior aspect of right hepatic lobe Incidental finding on imaging studies Stable for outpatient follow-up with PCP LFT stable  Obesity: Estimated body mass index is 34.17 kg/m as calculated from the following:   Height as of this encounter: 5\' 11"  (1.803 m).   Weight as of this encounter: 111.1 kg.   Code status:   Code Status: Full Code   DVT Prophylaxis: enoxaparin (LOVENOX) injection 40 mg Start: 12/31/22 1130 SCDs Start: 12/24/22 0856 SCDs Start: 12/24/22 0153   Family Communication: None at bedside   Disposition Plan: Status is: Inpatient Remains inpatient appropriate because: Severity of illness   Planned Discharge Destination:Home health versus SNF  Diet: Diet Order             DIET DYS 3 Room service appropriate? Yes; Fluid consistency: Thin  Diet effective now                     Antimicrobial agents: Anti-infectives (From admission, onward)    None        MEDICATIONS: Scheduled Meds:  atorvastatin  40 mg Oral QHS    baclofen  10 mg Oral TID   DULoxetine  30 mg Oral BID   enoxaparin (LOVENOX) injection  40 mg Subcutaneous Q24H   gabapentin  300 mg Oral TID   hydrALAZINE  50 mg Oral Q8H   isosorbide mononitrate  30 mg Oral Daily   losartan  25 mg Oral Daily   metoprolol succinate  50 mg Oral Daily   traZODone  50 mg Oral QHS   Continuous Infusions: PRN Meds:.acetaminophen **OR** acetaminophen, albuterol, benzocaine, hydrALAZINE, ondansetron **OR** ondansetron (ZOFRAN) IV, mouth rinse   I have personally reviewed following labs and imaging studies  LABORATORY DATA: CBC: Recent Labs  Lab 12/30/22 0335 01/01/23 0456  WBC 3.9* 3.2*  NEUTROABS 2.0 1.6*  HGB 10.2* 10.1*  HCT 32.4* 32.4*  MCV 80.6 81.0  PLT 231 228    Basic Metabolic Panel: Recent Labs  Lab 12/26/22 0317 12/30/22 0335 01/01/23 0456  NA 135 136 133*  K 4.3 3.9 4.2  CL 103 102 100  CO2 18* 23 24  GLUCOSE 128* 118* 141*  BUN 8 12 12   CREATININE 0.91 1.01 0.97  CALCIUM 8.9 8.6* 8.4*  MG 2.0  --   --     GFR: Estimated Creatinine Clearance: 70.6 mL/min (by C-G formula based on SCr of 0.97 mg/dL).  Liver Function Tests: No results for input(s): "AST", "ALT", "ALKPHOS", "BILITOT", "PROT", "ALBUMIN" in the last 168 hours. No results for input(s): "LIPASE", "AMYLASE" in the last 168 hours. No results for input(s): "AMMONIA" in the last 168 hours.  Coagulation Profile: No results for input(s): "INR", "PROTIME" in the last 168 hours.  Cardiac Enzymes: No results for input(s): "CKTOTAL", "CKMB", "CKMBINDEX", "TROPONINI" in the last 168 hours.  BNP (last 3 results) No results for input(s): "PROBNP" in the last 8760 hours.  Lipid Profile: No results for input(s): "CHOL", "HDL", "LDLCALC", "TRIG", "CHOLHDL", "LDLDIRECT" in the last 72 hours.  Thyroid Function Tests: No results for input(s): "TSH", "T4TOTAL", "FREET4", "T3FREE", "THYROIDAB" in the last 72 hours.  Anemia Panel: No results for input(s): "VITAMINB12",  "FOLATE", "FERRITIN", "TIBC", "IRON", "RETICCTPCT" in the last 72 hours.  Urine analysis:    Component Value Date/Time   COLORURINE STRAW (A) 12/23/2022 2354   APPEARANCEUR CLEAR 12/23/2022 2354   LABSPEC 1.004 (L) 12/23/2022 2354   PHURINE 6.0 12/23/2022 2354   GLUCOSEU NEGATIVE 12/23/2022 2354   HGBUR NEGATIVE 12/23/2022 2354   BILIRUBINUR NEGATIVE 12/23/2022 2354   KETONESUR NEGATIVE 12/23/2022 2354   PROTEINUR NEGATIVE 12/23/2022 2354   NITRITE NEGATIVE 12/23/2022 2354   LEUKOCYTESUR NEGATIVE 12/23/2022 2354    Sepsis Labs: Lactic Acid, Venous    Component Value Date/Time   LATICACIDVEN 1.8 10/18/2022 0248    MICROBIOLOGY: No results found for this or any previous visit (from the past 240 hour(s)).  RADIOLOGY STUDIES/RESULTS: No results found.   LOS: 7 days   Jeoffrey Massed, MD  [facility]    To contact the attending provider between 7A-7P or the covering provider during after hours 7P-7A, please log into the web site [email or web address]  and access using universal [place] password for that web site. If you do not have the password, please call the hospital operator.  01/01/2023, 11:28 AM

## 2023-01-01 NOTE — Consult Note (Signed)
Neurosurgery Consultation  Reason for Consult: Leg weakness / back pain Referring Physician: Ghimire  CC: Back / leg pain and leg weakness  HPI: This is a 88 y.o. man w/ h/o CAD on clopidogrel, Afib on rivaroxaban, prior stroke, both being held, who presents with worsening leg / back pain and new leg weakness. He has been non-ambulatory due to the left leg weakness with some spasms in the left leg that are more subacute but worseened. He has pain radiating down his LLE into this foot with left foot numbness, which worsens when he bears weight. He denies any change in bowel/bladder control, was worked up by neurology with concern for lumbar localization.    ROS: A 14 point ROS was performed and is negative except as noted in the HPI.   PMHx:  Past Medical History:  Diagnosis Date   Atrial fibrillation (HCC)    Coronary artery disease    Hypertension    NSTEMI (non-ST elevated myocardial infarction) (HCC)    Stroke (HCC)    FamHx:  Family History  Problem Relation Age of Onset   Diabetes Mother    Heart attack Father 62   Hypertension Brother    Heart disease Brother    Diabetes Brother    SocHx:  reports that he has quit smoking. He has never used smokeless tobacco. He reports that he does not drink alcohol and does not use drugs.  Exam: Vital signs in last 24 hours: Temp:  [97.6 F (36.4 C)-98 F (36.7 C)] 98 F (36.7 C) (07/01 0700) Pulse Rate:  [60-70] 70 (07/01 0700) Resp:  [16-18] 17 (07/01 0700) BP: (100-142)/(61-89) 133/75 (07/01 0700) SpO2:  [96 %-100 %] 98 % (07/01 0700) General: Awake, alert, cooperative, lying in bed in NAD Head: Normocephalic and atruamatic HEENT: Neck supple Pulmonary: breathing room air comfortably, no evidence of increased work of breathing Psych: full and reactive Abdomen: S NT ND Extremities: Warm and well perfused x4 Neuro: AOx3, PERRL, EOMI, FS Strength 5/5 except 3/5 strength in the left EHL and dorsiflexors, 4/5 in the left hip  flexors, SILTx4 except left foot numbness, no hoffman's, no clonus   Assessment and Plan: 88 y.o. man with the above and LLE pain / weakness / numbness.  CT myelogram personally reviewed, which shows severe canal stenosis at L2-3/3-4/4-5.   -discussed with the patient, no other clear localization of his symptoms. We discussed this at length given his multiple medical problems and advanced age. I, similarly, do not see another clear cause of his symptoms and his description of his LLE pain is quite typical for a lumbar radiculopathy. I therefore recommend decompression of L2-3/4-5/4-5. We again discussed increased perioperative risk given the above, but given his loss of ambulation, he would like to proceed. -OR tomorrow for decompression -NPO after midnight -continue to hold anticoagulants / antiplatelet agents, will discuss restart timing post-op -please call with any concerns or questions  [name], MD 01/01/23 10:23 AM [facility]

## 2023-01-01 NOTE — TOC Progression Note (Signed)
Transition of Care [facility]) - Progression Note    Patient Details  Name: Tristan Gardner MRN: [id number] Date of Birth: 1934-07-28  Transition of Care [facility]) CM/SW Contact  Mearl Latin, LCSW Phone Number: 01/01/2023, 4:18 PM  Clinical Narrative:    CSW continuing to follow and keep [facility] SNF updated on medical readiness and timing for insurance authorization.    Expected Discharge Plan: Skilled Nursing Facility Barriers to Discharge: Continued Medical Work up, English as a second language teacher, SNF Pending bed offer  Expected Discharge Plan and Services In-house Referral: Clinical Social Work   Post Acute Care Choice: Skilled Nursing Facility Living arrangements for the past 2 months: Single Family Home                                       Social Determinants of Health (SDOH) Interventions SDOH Screenings   Tobacco Use: Medium Risk (12/28/2022)    Readmission Risk Interventions     No data to display

## 2023-01-01 NOTE — Progress Notes (Signed)
Physical Therapy Treatment Patient Details Name: Tristan Gardner MRN: [id number] DOB: 14-Feb-1935 Today's Date: 01/01/2023   History of Present Illness The pt is an 88 yo male presenting 6/22 with inability to bear weight in L or R LE, frequent falls, R sided facial droop, and intermittent ataxia. CT showed no acute abnormality, pt unable to get MRI. PMH includes: afib, CAD, HTN, NSTEMI, CVA, sciatica, chronic pain, and obesity.    PT Comments  Pt tolerated today's session well, able to progress ambulation. Pt continues to require modA for initial sit>stand from bed, but progresses to minA-minG with stands from the chair, demonstrating good recall of utilizing BUE on arm rests to power up. Pt ambulated in the hallway, continuing to require minA for balance and support, especially with fatigue or turns. Pt has plans to go to the OR with neurosurgery tomorrow, acute PT will follow up afterwards to continue to progress mobility and assess further needs.      Assistance Recommended at Discharge Frequent or constant Supervision/Assistance  If plan is discharge home, recommend the following:  Can travel by private vehicle    A lot of help with walking and/or transfers;A little help with bathing/dressing/bathroom;Assistance with cooking/housework;Assist for transportation;Help with stairs or ramp for entrance   No  Equipment Recommendations  Rolling walker (2 wheels) (continue to assess)    Recommendations for Other Services       Precautions / Restrictions Precautions Precautions: Fall Precaution Comments: pt has had ems come two times to assist with falls recently, also fell day of admission Restrictions Weight Bearing Restrictions: No     Mobility  Bed Mobility Overal bed mobility: Needs Assistance Bed Mobility: Rolling, Sidelying to Sit Rolling: Supervision Sidelying to sit: Min guard       General bed mobility comments: close guard for balance with increased time for pt to  complete sidelying to sit, use of bed rail    Transfers Overall transfer level: Needs assistance Equipment used: Rolling walker (2 wheels) Transfers: Sit to/from Stand Sit to Stand: Min assist, Mod assist, Min guard           General transfer comment: modA to stand from bed, progressing from minA from chair to [employer]    Ambulation/Gait Ambulation/Gait assistance: Min assist Gait Distance (Feet): 40 Feet Assistive device: Rolling walker (2 wheels) Gait Pattern/deviations: Step-to pattern, Decreased dorsiflexion - left, Decreased weight shift to left, Knee flexed in stance - left, Decreased dorsiflexion - right, Shuffle, Drifts right/left, Trunk flexed Gait velocity: decreased     General Gait Details: decreased gait speed with decreased B foot clearance and step length, mild imbalance requiring minA for support and cueing for pt to take his time with 90 and 180 degree turns. Cued for upright posture and forward gaze   Stairs             Wheelchair Mobility     Tilt Bed    Modified Rankin (Stroke Patients Only)       Balance Overall balance assessment: Needs assistance Sitting-balance support: Feet supported, Bilateral upper extremity supported Sitting balance-Leahy Scale: Good     Standing balance support: Bilateral upper extremity supported, Reliant on assistive device for balance, During functional activity Standing balance-Leahy Scale: Poor Standing balance comment: reliant on RW                            Cognition Arousal/Alertness: Awake/alert Behavior During Therapy: WFL for tasks assessed/performed Overall Cognitive Status: No  family/caregiver present to determine baseline cognitive functioning                                 General Comments: HOH but good recall and carryover of learned skills, pleasant throughout session        Exercises Other Exercises Other Exercises: sit<>stand x5 reps from chair    General  Comments General comments (skin integrity, edema, etc.): VSS on room air      Pertinent Vitals/Pain Pain Assessment Pain Assessment: Faces Faces Pain Scale: Hurts a little bit Pain Location: LLE Pain Descriptors / Indicators: Tingling, Numbness Pain Intervention(s): Limited activity within patient's tolerance, Monitored during session, Repositioned    Home Living                          Prior Function            PT Goals (current goals can now be found in the care plan section) Acute Rehab PT Goals Patient Stated Goal: return to independence and strength prior to return home PT Goal Formulation: With patient Time For Goal Achievement: 01/07/23 Potential to Achieve Goals: Good Progress towards PT goals: Progressing toward goals    Frequency    Min 4X/week      PT Plan Current plan remains appropriate    Co-evaluation              AM-PAC PT "6 Clicks" Mobility   Outcome Measure  Help needed turning from your back to your side while in a flat bed without using bedrails?: A Little Help needed moving from lying on your back to sitting on the side of a flat bed without using bedrails?: A Little Help needed moving to and from a bed to a chair (including a wheelchair)?: A Lot Help needed standing up from a chair using your arms (e.g., wheelchair or bedside chair)?: A Lot Help needed to walk in hospital room?: A Little Help needed climbing 3-5 steps with a railing? : Total 6 Click Score: 14    End of Session Equipment Utilized During Treatment: Gait belt Activity Tolerance: Patient tolerated treatment well Patient left: with call bell/phone within reach;in chair Nurse Communication: Mobility status PT Visit Diagnosis: Unsteadiness on feet (R26.81);Other abnormalities of gait and mobility (R26.89);Muscle weakness (generalized) (M62.81)     Time: 4098-1191 PT Time Calculation (min) (ACUTE ONLY): 17 min  Charges:    $Gait Training: 8-22 mins PT  General Charges $$ ACUTE PT VISIT: 1 Visit                     Lindalou Hose, PT DPT Acute Rehabilitation Services Office [phone]    [name] 01/01/2023, 4:46 PM

## 2023-01-02 ENCOUNTER — Encounter (HOSPITAL_COMMUNITY): Payer: Self-pay | Admitting: Internal Medicine

## 2023-01-02 DIAGNOSIS — M48061 Spinal stenosis, lumbar region without neurogenic claudication: Secondary | ICD-10-CM | POA: Diagnosis not present

## 2023-01-02 DIAGNOSIS — I482 Chronic atrial fibrillation, unspecified: Secondary | ICD-10-CM | POA: Diagnosis not present

## 2023-01-02 DIAGNOSIS — E785 Hyperlipidemia, unspecified: Secondary | ICD-10-CM

## 2023-01-02 LAB — BASIC METABOLIC PANEL
Anion gap: 8 (ref 5–15)
BUN: 15 mg/dL (ref 8–23)
CO2: 25 mmol/L (ref 22–32)
Calcium: 8.5 mg/dL — ABNORMAL LOW (ref 8.9–10.3)
Chloride: 101 mmol/L (ref 98–111)
Creatinine, Ser: 1.18 mg/dL (ref 0.61–1.24)
GFR, Estimated: >60 mL/min (ref >60)
Glucose, Bld: 130 mg/dL — ABNORMAL HIGH (ref 70–99)
Potassium: 4.6 mmol/L (ref 3.5–5.1)
Sodium: 134 mmol/L — ABNORMAL LOW (ref 135–145)

## 2023-01-02 LAB — CBC
HCT: 31.7 % — ABNORMAL LOW (ref 39.0–52.0)
Hemoglobin: 9.7 g/dL — ABNORMAL LOW (ref 13.0–17.0)
MCH: 25.1 pg — ABNORMAL LOW (ref 26.0–34.0)
MCHC: 30.6 g/dL (ref 30.0–36.0)
MCV: 82.1 fL (ref 80.0–100.0)
Platelets: 239 10*3/uL (ref 150–400)
RBC: 3.86 MIL/uL — ABNORMAL LOW (ref 4.22–5.81)
RDW: 21.3 % — ABNORMAL HIGH (ref 11.5–15.5)
WBC: 4.3 10*3/uL (ref 4.0–10.5)
nRBC: 0 % (ref 0.0–0.2)

## 2023-01-02 MED ORDER — CHLORHEXIDINE GLUCONATE CLOTH 2 % EX PADS
6.0000 | MEDICATED_PAD | Freq: Once | CUTANEOUS | Status: AC
  Administered 2023-01-02: 6 via TOPICAL

## 2023-01-02 MED ORDER — HYDROMORPHONE HCL 1 MG/ML IJ SOLN
0.5000 mg | Freq: Once | INTRAMUSCULAR | Status: AC
  Administered 2023-01-02: 0.5 mg via INTRAVENOUS
  Filled 2023-01-02: qty 0.5

## 2023-01-02 MED ORDER — OXYCODONE HCL 5 MG PO TABS
5.0000 mg | ORAL_TABLET | ORAL | Status: AC | PRN
  Administered 2023-01-04 (×3): 5 mg via ORAL
  Filled 2023-01-02 (×3): qty 1

## 2023-01-02 NOTE — Progress Notes (Signed)
Occupational Therapy Treatment Patient Details Name: Tristan Gardner MRN: [id number] DOB: 06-Feb-1935 Today's Date: 01/02/2023   History of present illness The pt is an 88 yo male presenting 6/22 with inability to bear weight in L or R LE, frequent falls, R sided facial droop, and intermittent ataxia. CT showed no acute abnormality, pt unable to get MRI. PMH includes: afib, CAD, HTN, NSTEMI, CVA, sciatica, chronic pain, and obesity.   OT comments  Pt doing well, motivated to participate and improve function. Pt has made great improvement, set-up/supervision for most ADLs, transfers, did not test bed mobilty today as Pt likes to sit up in recliner for as long as possible throughout the day. Pt further instructed on use of sock aide/reacher, and to complete LB dressing with set up/supervision, one verbal cue only for sock aide. Pt has surgery planned this week, will likely require continued skilled therapy to maximize function post surgery and further assess needs. Pt would benefit from continued acute OT to further assess needs post surgery, DC plan may need updating after reassessment.    Recommendations for follow up therapy are one component of a multi-disciplinary discharge planning process, led by the attending physician.  Recommendations may be updated based on patient status, additional functional criteria and insurance authorization.    Assistance Recommended at Discharge Frequent or constant Supervision/Assistance  Patient can return home with the following  Assistance with cooking/housework;Direct supervision/assist for medications management;Direct supervision/assist for financial management;Assist for transportation;A little help with walking and/or transfers;A little help with bathing/dressing/bathroom   Equipment Recommendations  Other (comment) (defer)    Recommendations for Other Services      Precautions / Restrictions Precautions Precautions: Fall Restrictions Weight Bearing  Restrictions: No       Mobility Bed Mobility Overal bed mobility: Needs Assistance             General bed mobility comments: arrived in chair    Transfers Overall transfer level: Needs assistance Equipment used: Rolling walker (2 wheels) Transfers: Sit to/from Stand Sit to Stand: Supervision     Step pivot transfers: Supervision     General transfer comment: supervision for transfers, displays good balance, activity tolerance     Balance Overall balance assessment: Needs assistance Sitting-balance support: Feet supported, Bilateral upper extremity supported Sitting balance-Leahy Scale: Good Sitting balance - Comments: sitting on recliner performing LB ADLs   Standing balance support: No upper extremity supported, During functional activity Standing balance-Leahy Scale: Good Standing balance comment: able to stand unsupported to don pants, wash hands/face                           ADL either performed or assessed with clinical judgement   ADL Overall ADL's : Needs assistance/impaired Eating/Feeding: Independent   Grooming: Supervision/safety;Standing   Upper Body Bathing: Set up;Sitting   Lower Body Bathing: Minimal assistance;Sitting/lateral leans   Upper Body Dressing : Set up;Sitting   Lower Body Dressing: Supervision/safety;Set up;With adaptive equipment;Sitting/lateral leans   Toilet Transfer: Supervision/safety;Rolling walker (2 wheels);Regular Toilet   Toileting- Clothing Manipulation and Hygiene: Minimal assistance;Sitting/lateral lean       Functional mobility during ADLs: Supervision/safety;Rolling walker (2 wheels) General ADL Comments: Pt displays great improvement with functional independence, set-up supervision for most ADLs, sock aide/reacher/dressing stick for LB dressing    Extremity/Trunk Assessment Upper Extremity Assessment Upper Extremity Assessment: Overall WFL for tasks assessed            Vision  Perception     Praxis      Cognition Arousal/Alertness: Awake/alert Behavior During Therapy: WFL for tasks assessed/performed Overall Cognitive Status: Within Functional Limits for tasks assessed                                 General Comments: HOH but good recall and carryover of learned skills, pleasant throughout session        Exercises      Shoulder Instructions       General Comments      Pertinent Vitals/ Pain       Pain Assessment Pain Assessment: No/denies pain  Home Living                                          Prior Functioning/Environment              Frequency  Min 2X/week        Progress Toward Goals  OT Goals(current goals can now be found in the care plan section)  Progress towards OT goals: Progressing toward goals  Acute Rehab OT Goals Patient Stated Goal: to return home OT Goal Formulation: With patient Time For Goal Achievement: 01/08/23 Potential to Achieve Goals: Good ADL Goals Pt Will Perform Grooming: with min assist;standing Pt Will Perform Lower Body Bathing: with min assist;sit to/from stand Pt Will Perform Lower Body Dressing: sit to/from stand;with mod assist Pt Will Transfer to Toilet: with min assist;bedside commode Pt Will Perform Toileting - Clothing Manipulation and hygiene: with min assist;sit to/from stand  Plan Discharge plan remains appropriate    Co-evaluation                 AM-PAC OT "6 Clicks" Daily Activity     Outcome Measure   Help from another person eating meals?: None Help from another person taking care of personal grooming?: A Little Help from another person toileting, which includes using toliet, bedpan, or urinal?: A Little Help from another person bathing (including washing, rinsing, drying)?: A Little Help from another person to put on and taking off regular upper body clothing?: A Little Help from another person to put on and taking off regular  lower body clothing?: A Little 6 Click Score: 19    End of Session Equipment Utilized During Treatment: Gait belt;Rolling walker (2 wheels)  OT Visit Diagnosis: Unsteadiness on feet (R26.81);Other abnormalities of gait and mobility (R26.89);Repeated falls (R29.6);Pain;Other symptoms and signs involving the nervous system (R29.898) Pain - Right/Left: Left Pain - part of body: Leg   Activity Tolerance Patient tolerated treatment well   Patient Left in chair;with call bell/phone within reach;with nursing/sitter in room   Nurse Communication Mobility status        Time: 0981-1914 OT Time Calculation (min): 33 min  Charges: OT General Charges $OT Visit: 1 Visit OT Treatments $Self Care/Home Management : 23-37 mins  Whitney, OTR/L   Alexis Goodell 01/02/2023, 4:57 PM

## 2023-01-02 NOTE — Progress Notes (Signed)
Physical Therapy Treatment Patient Details Name: Tristan Gardner MRN: [id number] DOB: 1935/01/13 Today's Date: 01/02/2023   History of Present Illness The pt is an 88 yo male presenting 6/22 with inability to bear weight in L or R LE, frequent falls, R sided facial droop, and intermittent ataxia. CT showed no acute abnormality, pt unable to get MRI. PMH includes: afib, CAD, HTN, NSTEMI, CVA, sciatica, chronic pain, and obesity.    PT Comments  Pt continues to tolerate sessions well and make good progress with therapy. Pt able to progress ambulation distance and perform standing activities to progress foot clearance with ambulation and single leg balance with BUE support, as well as standing balance with reaching for targets. Pt is pending OR with neurosurgery tomorrow, will follow up afterwards to assess pt's mobility and update discharge recommendations and needs as appropriate. Acute PT will continue to follow.      Assistance Recommended at Discharge Frequent or constant Supervision/Assistance  If plan is discharge home, recommend the following:  Can travel by private vehicle    A little help with bathing/dressing/bathroom;Assistance with cooking/housework;Assist for transportation;Help with stairs or ramp for entrance;A little help with walking and/or transfers   No  Equipment Recommendations  Rolling walker (2 wheels) (continue to assess)    Recommendations for Other Services       Precautions / Restrictions Precautions Precautions: Fall Precaution Comments: pt has had ems come two times to assist with falls recently, also fell day of admission Restrictions Weight Bearing Restrictions: No     Mobility  Bed Mobility Overal bed mobility: Needs Assistance             General bed mobility comments: pt in chair upon arrival, ended session with pt in chair    Transfers Overall transfer level: Needs assistance Equipment used: Rolling walker (2 wheels) Transfers: Sit  to/from Stand Sit to Stand: Min guard, Supervision           General transfer comment: pt standing from chair x3 reps with minG progressing to supervision, good recall of use of B arm rests    Ambulation/Gait Ambulation/Gait assistance: Min guard, Min assist Gait Distance (Feet): 60 Feet Assistive device: Rolling walker (2 wheels) Gait Pattern/deviations: Step-to pattern, Decreased dorsiflexion - left, Decreased weight shift to left, Knee flexed in stance - left, Decreased dorsiflexion - right, Shuffle, Drifts right/left, Trunk flexed Gait velocity: decreased     General Gait Details: minG for safety and balance with minA briefly with 180 degree turns for safety and cueing of proper technique, pt intermittently kicking RW when attempting to turn. Improved forward gaze noted but returns to trunk flexion with fatigue   Stairs             Wheelchair Mobility     Tilt Bed    Modified Rankin (Stroke Patients Only)       Balance Overall balance assessment: Needs assistance Sitting-balance support: Feet supported, No upper extremity supported Sitting balance-Leahy Scale: Good     Standing balance support: During functional activity, Bilateral upper extremity supported, Reliant on assistive device for balance, No upper extremity supported Standing balance-Leahy Scale: Fair Standing balance comment: reliant on RW for ambulation but able to stand and reach with only intermittent UE support                            Cognition Arousal/Alertness: Awake/alert Behavior During Therapy: WFL for tasks assessed/performed Overall Cognitive Status: Within Functional Limits for  tasks assessed                                 General Comments: pleasant throughout        Exercises Other Exercises Other Exercises: Standing toe taps onto box with BUE support, 10 reps per BLE Other Exercises: Standing reaches to target x15 reps per UE, intermittent use of  UE on RW to maintain balance with reaches    General Comments General comments (skin integrity, edema, etc.): VSS on room air      Pertinent Vitals/Pain Pain Assessment Pain Assessment: No/denies pain    Home Living                          Prior Function            PT Goals (current goals can now be found in the care plan section) Acute Rehab PT Goals Patient Stated Goal: return to independence and strength prior to return home PT Goal Formulation: With patient Time For Goal Achievement: 01/07/23 Potential to Achieve Goals: Good Progress towards PT goals: Progressing toward goals    Frequency    Min 4X/week      PT Plan Current plan remains appropriate    Co-evaluation              AM-PAC PT "6 Clicks" Mobility   Outcome Measure  Help needed turning from your back to your side while in a flat bed without using bedrails?: A Little Help needed moving from lying on your back to sitting on the side of a flat bed without using bedrails?: A Little Help needed moving to and from a bed to a chair (including a wheelchair)?: A Little Help needed standing up from a chair using your arms (e.g., wheelchair or bedside chair)?: A Little Help needed to walk in hospital room?: A Little Help needed climbing 3-5 steps with a railing? : Total 6 Click Score: 16    End of Session Equipment Utilized During Treatment: Gait belt Activity Tolerance: Patient tolerated treatment well Patient left: with call bell/phone within reach;in chair Nurse Communication: Mobility status PT Visit Diagnosis: Unsteadiness on feet (R26.81);Other abnormalities of gait and mobility (R26.89);Muscle weakness (generalized) (M62.81)     Time: 0865-7846 PT Time Calculation (min) (ACUTE ONLY): 17 min  Charges:    $Therapeutic Activity: 8-22 mins PT General Charges $$ ACUTE PT VISIT: 1 Visit                     Lindalou Hose, PT DPT Acute Rehabilitation Services Office  [phone]    [name] 01/02/2023, 5:22 PM

## 2023-01-02 NOTE — Progress Notes (Signed)
Neurosurgery Service Progress Note  Subjective: No acute events overnight, no new complaints, stable sx today   Objective: Vitals:   01/02/23 0010 01/02/23 0359 01/02/23 0400 01/02/23 0630  BP: 108/66 110/62 (!) 118/59 124/74  Pulse: 70  (!) 59 (!) 59  Resp: (!) 21 (!) 21    Temp: 98 F (36.7 C) 98.5 F (36.9 C)    TempSrc: Oral Oral    SpO2: 98%  96% 95%  Weight:      Height:        Physical Exam: Strength 5/5 except 3/5 strength in the left EHL and dorsiflexors, 4/5 in the left hip flexors, SILTx4 except left foot numbness, no hoffman's, no clonus   Assessment & Plan: 88 y.o. man with LLE weakness, CT myelogram with severe lumbar stenosis and no other clear cause of symptoms.   -OR tomorrow (7/3) for decompressive laminectomy -NPO after midnight  Salley Boxley A Syvilla Martin  01/02/23 9:00 AM

## 2023-01-02 NOTE — Anesthesia Postprocedure Evaluation (Signed)
Anesthesia Post Note  Patient: Tristan Gardner  Procedure(s) Performed: TOTAL SPINE MYELOGRAM     Patient location during evaluation: PACU Anesthesia Type: General Level of consciousness: awake and alert Pain management: pain level controlled Vital Signs Assessment: post-procedure vital signs reviewed and stable Respiratory status: spontaneous breathing, nonlabored ventilation, respiratory function stable and patient connected to nasal cannula oxygen Cardiovascular status: blood pressure returned to baseline and stable Postop Assessment: no apparent nausea or vomiting Anesthetic complications: no   No notable events documented.  Last Vitals:  Vitals:   01/02/23 1115 01/02/23 1120  BP: 90/68 90/68  Pulse: 61 61  Resp:    Temp: 36.6 C 36.8 C  SpO2: 98% 98%    Last Pain:  Vitals:   01/02/23 1115  TempSrc: Oral  PainSc:                  Beryl Hornberger

## 2023-01-02 NOTE — Progress Notes (Signed)
PROGRESS NOTE        PATIENT DETAILS Name: Tristan Gardner Age: 88 y.o. Sex: male Date of Birth: 12-21-1934 Admit Date: 12/23/2022 Admitting Physician Frankey Shown, DO ZOX:[email or web address], [place], [place]  Brief Summary: Patient is a 88 y.o.  male with history of A-fib on Xarelto, CVA on Plavix-who presented with fall/generalized weakness-upon further evaluation was found to have severe lumbar stenosis on CT myelogram.  Significant events: 6/22>> admit to [facility]  Significant studies: 6/22>> x-ray pelvis: No fracture 6/22 >>x-ray tibia/fibula: No fracture 6/22>> CT head: No acute intracranial abnormality 6/22>> CT C-spine: No fracture 6/23>> CT T/L-spine: Motion degraded study-no fracture/traumatic subluxation. 6/24>> echo: EF 50-55% 6/26>> CT myelography C-spine: No compressive central canal stenosis, degenerative disc disease. 6/26>> CT myelography T-spine: No cord compression 6/26>> CT myelography L-spine: Severe multifactorial spinal stenosis at L3-4 and L4-5.  Significant microbiology data: None  Procedures: 6/26>> CT myelogram  Consults: Neurology Neurosurgery  Subjective: No major issues overnight.  Lying comfortably in bed.  Objective: Vitals: Blood pressure 109/68, pulse (!) 59, temperature 97.9 F (36.6 C), temperature source Oral, resp. rate (!) 21, height 5\' 11"  (1.803 m), weight 111.1 kg, SpO2 96 %.   Exam: Gen Exam:Alert awake-not in any distress HEENT:atraumatic, normocephalic Chest: B/L clear to auscultation anteriorly CVS:S1S2 regular Abdomen:soft non tender, non distended Extremities:no edema Neurology: Non focal Skin: no rash  Pertinent Labs/Radiology:    Latest Ref Rng & Units 01/02/2023    3:25 AM 01/01/2023    4:56 AM 12/30/2022    3:35 AM  CBC  WBC 4.0 - 10.5 K/uL 4.3  3.2  3.9   Hemoglobin 13.0 - 17.0 g/dL 9.7  09.8  11.9   Hematocrit 39.0 - 52.0 % 31.7  32.4  32.4   Platelets 150 - 400 K/uL 239  228  231     Lab Results   Component Value Date   NA 134 (L) 01/02/2023   K 4.6 01/02/2023   CL 101 01/02/2023   CO2 25 01/02/2023      Assessment/Plan: Worsening back pain-LLE weakness Felt to be secondary to severe canal stenosis at L3-L4, and L4-L5 Neurosurgery planning decompressive laminectomy 7/3  Right-sided dental pain secondary to dental caries Better with Orajel Outpatient follow-up with primary dentist (unfortunately no dental or oral surgery coverage available at [facility]  Peripheral neuropathy Neurontin  Chronic atrial fibrillation Metoprolol for rate control Xarelto held for decompressive laminectomy 7/2  History of CVA Plavix on hold Continues to 10  HTN Stable Imdur/losartan/Toprol  HLD Lipitor  Prediabetes (A1c 6.1 on 6/24)  follow CBGs/fasting glucose  Bladder wall thickening Incidental finding on imaging studies No clinical symptomatology consistent with UTI  7 mm hypodense lesion in the inferior aspect of right hepatic lobe Incidental finding on imaging studies Stable for outpatient follow-up with PCP LFT stable  Obesity: Estimated body mass index is 34.17 kg/m as calculated from the following:   Height as of this encounter: 5\' 11"  (1.803 m).   Weight as of this encounter: 111.1 kg.   Code status:   Code Status: Full Code   DVT Prophylaxis: enoxaparin (LOVENOX) injection 40 mg Start: 12/31/22 1130 SCDs Start: 12/24/22 0856 SCDs Start: 12/24/22 0153   Family Communication: None at bedside   Disposition Plan: Status is: Inpatient Remains inpatient appropriate because: Severity of illness   Planned Discharge Destination:Home health versus SNF  Diet: Diet Order             Diet NPO time specified  Diet effective midnight           DIET DYS 3 Room service appropriate? Yes; Fluid consistency: Thin  Diet effective now                     Antimicrobial agents: Anti-infectives (From admission, onward)    None        MEDICATIONS: Scheduled  Meds:  atorvastatin  40 mg Oral QHS   baclofen  10 mg Oral TID   DULoxetine  30 mg Oral BID   enoxaparin (LOVENOX) injection  40 mg Subcutaneous Q24H   gabapentin  300 mg Oral TID   hydrALAZINE  50 mg Oral Q8H   isosorbide mononitrate  30 mg Oral Daily   losartan  25 mg Oral Daily   metoprolol succinate  50 mg Oral Daily   traZODone  50 mg Oral QHS   Continuous Infusions: PRN Meds:.acetaminophen **OR** acetaminophen, albuterol, benzocaine, hydrALAZINE, ondansetron **OR** ondansetron (ZOFRAN) IV, mouth rinse   I have personally reviewed following labs and imaging studies  LABORATORY DATA: CBC: Recent Labs  Lab 12/30/22 0335 01/01/23 0456 01/02/23 0325  WBC 3.9* 3.2* 4.3  NEUTROABS 2.0 1.6*  --   HGB 10.2* 10.1* 9.7*  HCT 32.4* 32.4* 31.7*  MCV 80.6 81.0 82.1  PLT 231 228 239     Basic Metabolic Panel: Recent Labs  Lab 12/30/22 0335 01/01/23 0456 01/02/23 0325  NA 136 133* 134*  K 3.9 4.2 4.6  CL 102 100 101  CO2 23 24 25   GLUCOSE 118* 141* 130*  BUN 12 12 15   CREATININE 1.01 0.97 1.18  CALCIUM 8.6* 8.4* 8.5*     GFR: Estimated Creatinine Clearance: 58 mL/min (by C-G formula based on SCr of 1.18 mg/dL).  Liver Function Tests: No results for input(s): "AST", "ALT", "ALKPHOS", "BILITOT", "PROT", "ALBUMIN" in the last 168 hours. No results for input(s): "LIPASE", "AMYLASE" in the last 168 hours. No results for input(s): "AMMONIA" in the last 168 hours.  Coagulation Profile: No results for input(s): "INR", "PROTIME" in the last 168 hours.  Cardiac Enzymes: No results for input(s): "CKTOTAL", "CKMB", "CKMBINDEX", "TROPONINI" in the last 168 hours.  BNP (last 3 results) No results for input(s): "PROBNP" in the last 8760 hours.  Lipid Profile: No results for input(s): "CHOL", "HDL", "LDLCALC", "TRIG", "CHOLHDL", "LDLDIRECT" in the last 72 hours.  Thyroid Function Tests: No results for input(s): "TSH", "T4TOTAL", "FREET4", "T3FREE", "THYROIDAB" in the  last 72 hours.  Anemia Panel: No results for input(s): "VITAMINB12", "FOLATE", "FERRITIN", "TIBC", "IRON", "RETICCTPCT" in the last 72 hours.  Urine analysis:    Component Value Date/Time   COLORURINE STRAW (A) 12/23/2022 2354   APPEARANCEUR CLEAR 12/23/2022 2354   LABSPEC 1.004 (L) 12/23/2022 2354   PHURINE 6.0 12/23/2022 2354   GLUCOSEU NEGATIVE 12/23/2022 2354   HGBUR NEGATIVE 12/23/2022 2354   BILIRUBINUR NEGATIVE 12/23/2022 2354   KETONESUR NEGATIVE 12/23/2022 2354   PROTEINUR NEGATIVE 12/23/2022 2354   NITRITE NEGATIVE 12/23/2022 2354   LEUKOCYTESUR NEGATIVE 12/23/2022 2354    Sepsis Labs: Lactic Acid, Venous    Component Value Date/Time   LATICACIDVEN 1.8 10/18/2022 0248    MICROBIOLOGY: No results found for this or any previous visit (from the past 240 hour(s)).  RADIOLOGY STUDIES/RESULTS: No results found.   LOS: 8 days   Jeoffrey Massed, MD  [facility]    To  contact the attending provider between 7A-7P or the covering provider during after hours 7P-7A, please log into the web site [email or web address] and access using universal [place] password for that web site. If you do not have the password, please call the hospital operator.  01/02/2023, 10:55 AM

## 2023-01-02 NOTE — Anesthesia Preprocedure Evaluation (Signed)
Anesthesia Evaluation  Patient identified by MRN, date of birth, ID band Patient awake    Reviewed: Allergy & Precautions, NPO status , Patient's Chart, lab work & pertinent test results, reviewed documented beta blocker date and time   History of Anesthesia Complications Negative for: history of anesthetic complications  Airway Mallampati: II  TM Distance: >3 FB Neck ROM: Full    Dental  (+) Dental Advisory Given, Missing, Poor Dentition, Chipped   Pulmonary neg shortness of breath, neg sleep apnea, neg COPD, neg recent URI, former smoker   Pulmonary exam normal breath sounds clear to auscultation       Cardiovascular hypertension, Pt. on medications and Pt. on home beta blockers + CAD and + Past MI  + dysrhythmias Atrial Fibrillation  Rhythm:Irregular Rate:Bradycardia  Hx/o NSTEMI Chronic atrial fibrillation/flutter  EKG 12/23/22 Atrial flutter rate in 50's, Low voltage, precordial leads Abnormal R-wave progression, early transition Minimal ST depression inferior leads  Echo 12/25/22 1. Left ventricular ejection fraction, by estimation, is 50 to 55%. The  left ventricle has low normal function. The left ventricle has no regional  wall motion abnormalities. There is moderate concentric left ventricular  hypertrophy. Left ventricular  diastolic function could not be evaluated.   2. Right ventricular systolic function was not well visualized. The right  ventricular size is not well visualized. There is normal pulmonary artery  systolic pressure.   3. Left atrial size was mild to moderately dilated.   4. The mitral valve is normal in structure. Mild mitral valve  regurgitation. No evidence of mitral stenosis.   5. The aortic valve is tricuspid. There is mild calcification of the  aortic valve. There is mild thickening of the aortic valve. Aortic valve  regurgitation is not visualized.   6. Aortic dilatation noted. There is  mild dilatation of the ascending  aorta, measuring 40 mm.   7. The inferior vena cava is normal in size with greater than 50%  respiratory variability, suggesting right atrial pressure of 3 mmHg.        Neuro/Psych LLE weakness attributed to lumbar spinal stenosis Unable to ambulate  Neuromuscular disease CVA, Residual Symptoms  negative psych ROS   GI/Hepatic Neg liver ROS, PUD,,,  Endo/Other  negative endocrine ROS  Obesity HLD Pre diabetes   Renal/GU negative Renal ROSLab Results      Component                Value               Date                      CREATININE               0.91                12/26/2022             negative genitourinary   Musculoskeletal negative musculoskeletal ROS (+)  Lumbar spinal stenosis L2-L5  DDD Cervical and Lumbar spine  Myelogram 7/2 1. Advanced chronic degenerative changes throughout the lumbar region as outlined above. Severe multifactorial spinal stenosis at L3-4 and L4-5. Moderate multifactorial spinal stenosis at L2-3. Lateral recess stenosis at L5-S1 left more than right. Foraminal stenosis at L1-2, L2-3, L3-4 and L4-5 and at L5-S1.    Abdominal  (+) + obese  Peds  Hematology  (+) Blood dyscrasia, anemia Plavix therapy- last dose 6/22 Xarelto therapy- last dose 6/22   Anesthesia  Other Findings   Reproductive/Obstetrics                             Anesthesia Physical Anesthesia Plan  ASA: 3  Anesthesia Plan: General   Post-op Pain Management: Dilaudid IV, Lidocaine infusion* and Ofirmev IV (intra-op)*   Induction: Intravenous  PONV Risk Score and Plan: 3 and Treatment may vary due to age or medical condition and Ondansetron  Airway Management Planned: Oral ETT  Additional Equipment: Arterial line  Intra-op Plan:   Post-operative Plan: Extubation in OR  Informed Consent: I have reviewed the patients History and Physical, chart, labs and discussed the procedure including the risks,  benefits and alternatives for the proposed anesthesia with the patient or authorized representative who has indicated his/her understanding and acceptance.     Dental advisory given  Plan Discussed with: CRNA, Anesthesiologist and Surgeon  Anesthesia Plan Comments:        Anesthesia Quick Evaluation

## 2023-01-03 ENCOUNTER — Encounter (HOSPITAL_COMMUNITY)
Admission: EM | Disposition: A | Discharge status: Skilled Nursing Facility | Payer: Self-pay | Source: Home / Self Care | Attending: Internal Medicine

## 2023-01-03 ENCOUNTER — Inpatient Hospital Stay (HOSPITAL_COMMUNITY): Payer: Medicare PPO

## 2023-01-03 ENCOUNTER — Inpatient Hospital Stay (HOSPITAL_COMMUNITY): Payer: Medicare PPO | Admitting: Anesthesiology

## 2023-01-03 ENCOUNTER — Other Ambulatory Visit: Payer: Self-pay

## 2023-01-03 ENCOUNTER — Encounter (HOSPITAL_COMMUNITY): Payer: Self-pay | Admitting: Internal Medicine

## 2023-01-03 DIAGNOSIS — I2510 Atherosclerotic heart disease of native coronary artery without angina pectoris: Secondary | ICD-10-CM | POA: Diagnosis not present

## 2023-01-03 DIAGNOSIS — M48061 Spinal stenosis, lumbar region without neurogenic claudication: Secondary | ICD-10-CM

## 2023-01-03 DIAGNOSIS — Z87891 Personal history of nicotine dependence: Secondary | ICD-10-CM | POA: Diagnosis not present

## 2023-01-03 DIAGNOSIS — I482 Chronic atrial fibrillation, unspecified: Secondary | ICD-10-CM | POA: Diagnosis not present

## 2023-01-03 DIAGNOSIS — I10 Essential (primary) hypertension: Secondary | ICD-10-CM

## 2023-01-03 DIAGNOSIS — E785 Hyperlipidemia, unspecified: Secondary | ICD-10-CM | POA: Diagnosis not present

## 2023-01-03 HISTORY — PX: LUMBAR LAMINECTOMY/DECOMPRESSION MICRODISCECTOMY: SHX5026

## 2023-01-03 LAB — SURGICAL PCR SCREEN
MRSA, PCR: NEGATIVE
Staphylococcus aureus: NEGATIVE

## 2023-01-03 SURGERY — LUMBAR LAMINECTOMY/DECOMPRESSION MICRODISCECTOMY 3 LEVELS
Anesthesia: General

## 2023-01-03 MED ORDER — HYDROMORPHONE HCL 1 MG/ML IJ SOLN
INTRAMUSCULAR | Status: DC | PRN
  Administered 2023-01-03 (×2): .5 mg via INTRAVENOUS

## 2023-01-03 MED ORDER — ORAL CARE MOUTH RINSE: 15.0000 mL | Freq: Once | OROMUCOSAL | Status: AC

## 2023-01-03 MED ORDER — ESMOLOL HCL 100 MG/10ML IV SOLN
INTRAVENOUS | Status: DC | PRN
  Administered 2023-01-03: 20 mg via INTRAVENOUS

## 2023-01-03 MED ORDER — 0.9 % SODIUM CHLORIDE (POUR BTL) OPTIME
TOPICAL | Status: DC | PRN
  Administered 2023-01-03: 1000 mL

## 2023-01-03 MED ORDER — LIDOCAINE-EPINEPHRINE 1 %-1:100000 IJ SOLN
INTRAMUSCULAR | Status: AC
  Filled 2023-01-03: qty 1

## 2023-01-03 MED ORDER — HYDROMORPHONE HCL 1 MG/ML IJ SOLN
INTRAMUSCULAR | Status: AC
  Filled 2023-01-03: qty 0.5

## 2023-01-03 MED ORDER — LACTATED RINGERS IV SOLN: INTRAVENOUS | Status: DC

## 2023-01-03 MED ORDER — EPHEDRINE SULFATE-NACL 50-0.9 MG/10ML-% IV SOSY
PREFILLED_SYRINGE | INTRAVENOUS | Status: DC | PRN
  Administered 2023-01-03: 15 mg via INTRAVENOUS
  Administered 2023-01-03: 10 mg via INTRAVENOUS

## 2023-01-03 MED ORDER — CHLORHEXIDINE GLUCONATE 0.12 % MT SOLN
OROMUCOSAL | Status: AC
  Administered 2023-01-03: 15 mL via OROMUCOSAL
  Filled 2023-01-03: qty 15

## 2023-01-03 MED ORDER — ONDANSETRON HCL 4 MG/2ML IJ SOLN
INTRAMUSCULAR | Status: DC | PRN
  Administered 2023-01-03: 4 mg via INTRAVENOUS

## 2023-01-03 MED ORDER — PHENYLEPHRINE HCL-NACL 20-0.9 MG/250ML-% IV SOLN
INTRAVENOUS | Status: DC | PRN
  Administered 2023-01-03: 50 ug/min via INTRAVENOUS

## 2023-01-03 MED ORDER — PHENYLEPHRINE 80 MCG/ML (10ML) SYRINGE FOR IV PUSH (FOR BLOOD PRESSURE SUPPORT)
PREFILLED_SYRINGE | INTRAVENOUS | Status: DC | PRN
  Administered 2023-01-03 (×3): 200 ug via INTRAVENOUS

## 2023-01-03 MED ORDER — KETOROLAC TROMETHAMINE 30 MG/ML IJ SOLN
INTRAMUSCULAR | Status: AC
  Filled 2023-01-03: qty 1

## 2023-01-03 MED ORDER — ONDANSETRON HCL 4 MG/2ML IJ SOLN: 4.0000 mg | Freq: Once | INTRAMUSCULAR | Status: DC | PRN

## 2023-01-03 MED ORDER — FENTANYL CITRATE (PF) 250 MCG/5ML IJ SOLN
INTRAMUSCULAR | Status: AC
  Filled 2023-01-03: qty 5

## 2023-01-03 MED ORDER — SUGAMMADEX SODIUM 200 MG/2ML IV SOLN
INTRAVENOUS | Status: DC | PRN
  Administered 2023-01-03: 300 mg via INTRAVENOUS

## 2023-01-03 MED ORDER — CEFAZOLIN SODIUM-DEXTROSE 2-4 GM/100ML-% IV SOLN
2.0000 g | INTRAVENOUS | Status: DC
Start: 2023-01-03 — End: 2023-01-03

## 2023-01-03 MED ORDER — ROCURONIUM BROMIDE 10 MG/ML (PF) SYRINGE
PREFILLED_SYRINGE | INTRAVENOUS | Status: DC | PRN
  Administered 2023-01-03: 70 mg via INTRAVENOUS
  Administered 2023-01-03: 30 mg via INTRAVENOUS

## 2023-01-03 MED ORDER — DEXAMETHASONE SODIUM PHOSPHATE 10 MG/ML IJ SOLN
INTRAMUSCULAR | Status: DC | PRN
  Administered 2023-01-03: 10 mg via INTRAVENOUS

## 2023-01-03 MED ORDER — CEFAZOLIN SODIUM-DEXTROSE 2-4 GM/100ML-% IV SOLN
INTRAVENOUS | Status: AC
  Filled 2023-01-03: qty 100

## 2023-01-03 MED ORDER — PROPOFOL 10 MG/ML IV BOLUS
INTRAVENOUS | Status: AC
  Filled 2023-01-03: qty 20

## 2023-01-03 MED ORDER — LIDOCAINE-EPINEPHRINE 1 %-1:100000 IJ SOLN
INTRAMUSCULAR | Status: DC | PRN
  Administered 2023-01-03: 10 mL

## 2023-01-03 MED ORDER — THROMBIN 5000 UNITS EX SOLR
OROMUCOSAL | Status: DC | PRN
  Administered 2023-01-03: 5 mL via TOPICAL

## 2023-01-03 MED ORDER — FENTANYL CITRATE (PF) 250 MCG/5ML IJ SOLN
INTRAMUSCULAR | Status: DC | PRN
  Administered 2023-01-03 (×2): 50 ug via INTRAVENOUS
  Administered 2023-01-03: 100 ug via INTRAVENOUS
  Administered 2023-01-03: 50 ug via INTRAVENOUS

## 2023-01-03 MED ORDER — CEFAZOLIN SODIUM-DEXTROSE 2-4 GM/100ML-% IV SOLN
2.0000 g | INTRAVENOUS | Status: AC
  Administered 2023-01-03: 2 g via INTRAVENOUS

## 2023-01-03 MED ORDER — CHLORHEXIDINE GLUCONATE 0.12 % MT SOLN: 15.0000 mL | Freq: Once | OROMUCOSAL | Status: AC

## 2023-01-03 MED ORDER — PROPOFOL 10 MG/ML IV BOLUS
INTRAVENOUS | Status: DC | PRN
  Administered 2023-01-03: 110 mg via INTRAVENOUS

## 2023-01-03 MED ORDER — LIDOCAINE IN D5W 4-5 MG/ML-% IV SOLN
INTRAVENOUS | Status: DC | PRN
  Administered 2023-01-03: 2 mg/min via INTRAVENOUS

## 2023-01-03 MED ORDER — THROMBIN 5000 UNITS EX SOLR
CUTANEOUS | Status: AC
  Filled 2023-01-03: qty 5000

## 2023-01-03 MED ORDER — ACETAMINOPHEN 10 MG/ML IV SOLN
INTRAVENOUS | Status: DC | PRN
  Administered 2023-01-03: 1000 mg via INTRAVENOUS

## 2023-01-03 MED ORDER — LIDOCAINE 2% (20 MG/ML) 5 ML SYRINGE
INTRAMUSCULAR | Status: DC | PRN
  Administered 2023-01-03: 60 mg via INTRAVENOUS

## 2023-01-03 MED ORDER — VASOPRESSIN 20 UNIT/ML IV SOLN
INTRAVENOUS | Status: AC
  Filled 2023-01-03: qty 1

## 2023-01-03 MED ORDER — HYDROMORPHONE HCL 1 MG/ML IJ SOLN: 0.2500 mg | INTRAMUSCULAR | Status: DC | PRN

## 2023-01-03 MED ORDER — VASOPRESSIN 20 UNIT/ML IV SOLN
INTRAVENOUS | Status: DC | PRN
  Administered 2023-01-03: 1 [IU] via INTRAVENOUS

## 2023-01-03 SURGICAL SUPPLY — 54 items
ADH SKN CLS APL DERMABOND .7 (GAUZE/BANDAGES/DRESSINGS) ×1
BAG COUNTER SPONGE SURGICOUNT (BAG) ×1 IMPLANT
BAG SPNG CNTER NS LX DISP (BAG) ×1
BLADE CLIPPER SURG (BLADE) IMPLANT
BLADE SURG 11 STRL SS (BLADE) ×1 IMPLANT
BUR MATCHSTICK NEURO 3.0 LAGG (BURR) IMPLANT
BUR PRECISION FLUTE 5.0 (BURR) IMPLANT
CANISTER SUCT 3000ML PPV (MISCELLANEOUS) ×1 IMPLANT
DERMABOND ADVANCED .7 DNX12 (GAUZE/BANDAGES/DRESSINGS) ×1 IMPLANT
DRAPE C-ARM 42X72 X-RAY (DRAPES) ×2 IMPLANT
DRAPE LAPAROTOMY 100X72X124 (DRAPES) ×1 IMPLANT
DRAPE MICROSCOPE SLANT 54X150 (MISCELLANEOUS) ×1 IMPLANT
DRAPE SURG 17X23 STRL (DRAPES) ×1 IMPLANT
DURAPREP 26ML APPLICATOR (WOUND CARE) ×1 IMPLANT
ELECT REM PT RETURN 9FT ADLT (ELECTROSURGICAL) ×1
ELECTRODE REM PT RTRN 9FT ADLT (ELECTROSURGICAL) ×1 IMPLANT
GAUZE 4X4 16PLY ~~LOC~~+RFID DBL (SPONGE) IMPLANT
GAUZE SPONGE 4X4 12PLY STRL (GAUZE/BANDAGES/DRESSINGS) IMPLANT
GLOVE BIO SURGEON STRL SZ7.5 (GLOVE) ×1 IMPLANT
GLOVE BIOGEL PI IND STRL 7.0 (GLOVE) ×1 IMPLANT
GLOVE BIOGEL PI IND STRL 7.5 (GLOVE) ×1 IMPLANT
GLOVE ECLIPSE 7.5 STRL STRAW (GLOVE) ×1 IMPLANT
GLOVE EXAM NITRILE LRG STRL (GLOVE) IMPLANT
GLOVE EXAM NITRILE XL STR (GLOVE) IMPLANT
GLOVE EXAM NITRILE XS STR PU (GLOVE) IMPLANT
GOWN STRL REUS W/ TWL LRG LVL3 (GOWN DISPOSABLE) ×2 IMPLANT
GOWN STRL REUS W/ TWL XL LVL3 (GOWN DISPOSABLE) IMPLANT
GOWN STRL REUS W/TWL 2XL LVL3 (GOWN DISPOSABLE) IMPLANT
GOWN STRL REUS W/TWL LRG LVL3 (GOWN DISPOSABLE) ×2
GOWN STRL REUS W/TWL XL LVL3 (GOWN DISPOSABLE)
HEMOSTAT POWDER KIT SURGIFOAM (HEMOSTASIS) ×1 IMPLANT
KIT BASIN OR (CUSTOM PROCEDURE TRAY) ×1 IMPLANT
KIT TURNOVER KIT B (KITS) ×1 IMPLANT
NDL HYPO 18GX1.5 BLUNT FILL (NEEDLE) IMPLANT
NDL HYPO 22X1.5 SAFETY MO (MISCELLANEOUS) ×1 IMPLANT
NDL SPNL 18GX3.5 QUINCKE PK (NEEDLE) ×1 IMPLANT
NEEDLE HYPO 18GX1.5 BLUNT FILL (NEEDLE) IMPLANT
NEEDLE HYPO 22X1.5 SAFETY MO (MISCELLANEOUS) ×1 IMPLANT
NEEDLE SPNL 18GX3.5 QUINCKE PK (NEEDLE) ×1 IMPLANT
NS IRRIG 1000ML POUR BTL (IV SOLUTION) ×1 IMPLANT
PACK LAMINECTOMY NEURO (CUSTOM PROCEDURE TRAY) ×1 IMPLANT
PAD ARMBOARD 7.5X6 YLW CONV (MISCELLANEOUS) ×3 IMPLANT
SOL ELECTROSURG ANTI STICK (MISCELLANEOUS) ×1
SOLUTION ELECTROSURG ANTI STCK (MISCELLANEOUS) ×1 IMPLANT
SPIKE FLUID TRANSFER (MISCELLANEOUS) ×1 IMPLANT
SPONGE T-LAP 4X18 ~~LOC~~+RFID (SPONGE) IMPLANT
SUT MNCRL AB 3-0 PS2 18 (SUTURE) ×1 IMPLANT
SUT VIC AB 0 CT1 18XCR BRD8 (SUTURE) IMPLANT
SUT VIC AB 0 CT1 8-18 (SUTURE) ×1
SUT VIC AB 2-0 CT2 18 VCP726D (SUTURE) ×1 IMPLANT
SYR 3ML LL SCALE MARK (SYRINGE) IMPLANT
TOWEL GREEN STERILE (TOWEL DISPOSABLE) ×1 IMPLANT
TOWEL GREEN STERILE FF (TOWEL DISPOSABLE) ×1 IMPLANT
WATER STERILE IRR 1000ML POUR (IV SOLUTION) ×1 IMPLANT

## 2023-01-03 NOTE — Progress Notes (Signed)
Neurosurgery Service Progress Note  Subjective: No acute events overnight, no new complaints, ready for surgery  Objective: Vitals:   01/02/23 1642 01/02/23 2020 01/02/23 2345 01/03/23 0409  BP: (!) 91/54 134/74 101/75 (!) 125/56  Pulse: 61 60 61 66  Resp:      Temp: 97.6 F (36.4 C) 97.9 F (36.6 C) 97.8 F (36.6 C) 98.1 F (36.7 C)  TempSrc: Oral Oral Oral Oral  SpO2: 98%  95% 98%  Weight:      Height:        Physical Exam: Strength 5/5 except 3/5 strength in the left EHL and dorsiflexors, 4/5 in the left hip flexors, SILTx4 except left foot numbness, no hoffman's, no clonus   Assessment & Plan: 88 y.o. man with LLE weakness, CT myelogram with severe lumbar stenosis and no other clear cause of symptoms.   -OR today for decompressive laminectomies -post-op can resume regular diet, activity as tolerated, no restrictions / no brace needed   [name]  01/03/23 7:51 AM

## 2023-01-03 NOTE — Anesthesia Postprocedure Evaluation (Signed)
Anesthesia Post Note  Patient: Courtney Reifsnyder  Procedure(s) Performed: Lumbar Two-Three, Lumbar Three-Four, Lumbar Four-Five Open laminectomies     Patient location during evaluation: PACU Anesthesia Type: General Level of consciousness: awake and alert Pain management: pain level controlled Vital Signs Assessment: post-procedure vital signs reviewed and stable Respiratory status: spontaneous breathing, nonlabored ventilation and respiratory function stable Cardiovascular status: blood pressure returned to baseline and stable Postop Assessment: no apparent nausea or vomiting Anesthetic complications: no   No notable events documented.  Last Vitals:  Vitals:   01/03/23 1200 01/03/23 1215  BP: 103/62 108/75  Pulse: (!) 57 76  Resp: 13 16  Temp:    SpO2: 93% 97%    Last Pain:  Vitals:   01/03/23 1215  TempSrc:   PainSc: 0-No pain                 Emrick Hensch A.

## 2023-01-03 NOTE — Progress Notes (Signed)
PROGRESS NOTE        PATIENT DETAILS Name: Tristan Gardner Age: 88 y.o. Sex: male Date of Birth: Jun 22, 1935 Admit Date: 12/23/2022 Admitting Physician Frankey Shown, DO NWG:NFAOZHY, [place], PA  Brief Summary: Patient is a 88 y.o.  male with history of A-fib on Xarelto, CVA on Plavix-who presented with fall/generalized weakness-upon further evaluation was found to have severe lumbar stenosis on CT myelogram.  Significant events: 6/22>> admit to [facility]  Significant studies: 6/22>> x-ray pelvis: No fracture 6/22 >>x-ray tibia/fibula: No fracture 6/22>> CT head: No acute intracranial abnormality 6/22>> CT C-spine: No fracture 6/23>> CT T/L-spine: Motion degraded study-no fracture/traumatic subluxation. 6/24>> echo: EF 50-55% 6/26>> CT myelography C-spine: No compressive central canal stenosis, degenerative disc disease. 6/26>> CT myelography T-spine: No cord compression 6/26>> CT myelography L-spine: Severe multifactorial spinal stenosis at L3-4 and L4-5.  Significant microbiology data: None  Procedures: 6/26>> CT myelogram  Consults: Neurology Neurosurgery  Subjective: No major issues overnight.  Lying comfortably in bed.  Objective: Vitals: Blood pressure 121/68, pulse 60, temperature 98.2 F (36.8 C), temperature source Oral, resp. rate 16, height 5\' 11"  (1.803 m), weight 113.4 kg, SpO2 98 %.   Exam: Gen Exam:Alert awake-not in any distress HEENT:atraumatic, normocephalic Chest: B/L clear to auscultation anteriorly CVS:S1S2 regular Abdomen:soft non tender, non distended Extremities:no edema Skin: no rash  Pertinent Labs/Radiology:    Latest Ref Rng & Units 01/02/2023    3:25 AM 01/01/2023    4:56 AM 12/30/2022    3:35 AM  CBC  WBC 4.0 - 10.5 K/uL 4.3  3.2  3.9   Hemoglobin 13.0 - 17.0 g/dL 9.7  86.5  78.4   Hematocrit 39.0 - 52.0 % 31.7  32.4  32.4   Platelets 150 - 400 K/uL 239  228  231     Lab Results  Component Value Date   NA  134 (L) 01/02/2023   K 4.6 01/02/2023   CL 101 01/02/2023   CO2 25 01/02/2023    Assessment/Plan: Worsening back pain-LLE weakness Felt to be secondary to severe canal stenosis at L3-L4, and L4-L5 Neurosurgery planning decompressive laminectomy 7/3  Right-sided dental pain secondary to dental caries Better with Orajel Outpatient follow-up with primary dentist (unfortunately no dental or oral surgery coverage available at [facility])  Peripheral neuropathy Neurontin  Chronic atrial fibrillation Metoprolol for rate control Xarelto held for decompressive laminectomy 7/2  History of CVA Plavix on hold Continues to 10  HTN Stable Imdur/losartan/Toprol  HLD Lipitor  Prediabetes (A1c 6.1 on 6/24)  follow CBGs/fasting glucose  Bladder wall thickening Incidental finding on imaging studies No clinical symptomatology consistent with UTI  7 mm hypodense lesion in the inferior aspect of right hepatic lobe Incidental finding on imaging studies Stable for outpatient follow-up with PCP LFT stable  Obesity: Estimated body mass index is 34.87 kg/m as calculated from the following:   Height as of this encounter: 5\' 11"  (1.803 m).   Weight as of this encounter: 113.4 kg.   Code status:   Code Status: Full Code   DVT Prophylaxis: SCD's Start: 01/03/23 0755 SCD's Start: 01/02/23 1805 enoxaparin (LOVENOX) injection 40 mg Start: 12/31/22 1130 SCDs Start: 12/24/22 0856 SCDs Start: 12/24/22 0153   Family Communication: None at bedside   Disposition Plan: Status is: Inpatient Remains inpatient appropriate because: Severity of illness   Planned Discharge Destination:Home health versus SNF  Diet: Diet Order             Diet NPO time specified  Diet effective midnight                     Antimicrobial agents: Anti-infectives (From admission, onward)    Start     Dose/Rate Route Frequency Ordered Stop   01/03/23 0808  ceFAZolin (ANCEF) 2-4 GM/100ML-% IVPB        Note to Pharmacy: Shanda Bumps M: cabinet override      01/03/23 0808 01/03/23 2014   01/03/23 0800  ceFAZolin (ANCEF) IVPB 2g/100 mL premix        2 g 200 mL/hr over 30 Minutes Intravenous On call to O.R. 01/03/23 0754 01/04/23 0559   01/03/23 0754  ceFAZolin (ANCEF) IVPB 2g/100 mL premix  Status:  Discontinued        2 g 200 mL/hr over 30 Minutes Intravenous 30 min pre-op 01/03/23 0754 01/03/23 0755        MEDICATIONS: Scheduled Meds:  [MAR Hold] atorvastatin  40 mg Oral QHS   [MAR Hold] baclofen  10 mg Oral TID   [MAR Hold] DULoxetine  30 mg Oral BID   [MAR Hold] enoxaparin (LOVENOX) injection  40 mg Subcutaneous Q24H   [MAR Hold] gabapentin  300 mg Oral TID   [MAR Hold] hydrALAZINE  50 mg Oral Q8H   [MAR Hold] isosorbide mononitrate  30 mg Oral Daily   [MAR Hold] losartan  25 mg Oral Daily   [MAR Hold] metoprolol succinate  50 mg Oral Daily   [MAR Hold] traZODone  50 mg Oral QHS   Continuous Infusions:  ceFAZolin      ceFAZolin (ANCEF) IV     lactated ringers 10 mL/hr at 01/03/23 0808   PRN Meds:.[MAR Hold] acetaminophen **OR** [MAR Hold] acetaminophen, [MAR Hold] albuterol, [MAR Hold] benzocaine, ceFAZolin, [MAR Hold] hydrALAZINE, [MAR Hold] ondansetron **OR** [MAR Hold] ondansetron (ZOFRAN) IV, [MAR Hold] mouth rinse, [MAR Hold] oxyCODONE   I have personally reviewed following labs and imaging studies  LABORATORY DATA: CBC: Recent Labs  Lab 12/30/22 0335 01/01/23 0456 01/02/23 0325  WBC 3.9* 3.2* 4.3  NEUTROABS 2.0 1.6*  --   HGB 10.2* 10.1* 9.7*  HCT 32.4* 32.4* 31.7*  MCV 80.6 81.0 82.1  PLT 231 228 239     Basic Metabolic Panel: Recent Labs  Lab 12/30/22 0335 01/01/23 0456 01/02/23 0325  NA 136 133* 134*  K 3.9 4.2 4.6  CL 102 100 101  CO2 23 24 25   GLUCOSE 118* 141* 130*  BUN 12 12 15   CREATININE 1.01 0.97 1.18  CALCIUM 8.6* 8.4* 8.5*     GFR: Estimated Creatinine Clearance: 58.6 mL/min (by C-G formula based on SCr of 1.18  mg/dL).  Liver Function Tests: No results for input(s): "AST", "ALT", "ALKPHOS", "BILITOT", "PROT", "ALBUMIN" in the last 168 hours. No results for input(s): "LIPASE", "AMYLASE" in the last 168 hours. No results for input(s): "AMMONIA" in the last 168 hours.  Coagulation Profile: No results for input(s): "INR", "PROTIME" in the last 168 hours.  Cardiac Enzymes: No results for input(s): "CKTOTAL", "CKMB", "CKMBINDEX", "TROPONINI" in the last 168 hours.  BNP (last 3 results) No results for input(s): "PROBNP" in the last 8760 hours.  Lipid Profile: No results for input(s): "CHOL", "HDL", "LDLCALC", "TRIG", "CHOLHDL", "LDLDIRECT" in the last 72 hours.  Thyroid Function Tests: No results for input(s): "TSH", "T4TOTAL", "FREET4", "T3FREE", "THYROIDAB" in the last 72 hours.  Anemia Panel: No  results for input(s): "VITAMINB12", "FOLATE", "FERRITIN", "TIBC", "IRON", "RETICCTPCT" in the last 72 hours.  Urine analysis:    Component Value Date/Time   COLORURINE STRAW (A) 12/23/2022 2354   APPEARANCEUR CLEAR 12/23/2022 2354   LABSPEC 1.004 (L) 12/23/2022 2354   PHURINE 6.0 12/23/2022 2354   GLUCOSEU NEGATIVE 12/23/2022 2354   HGBUR NEGATIVE 12/23/2022 2354   BILIRUBINUR NEGATIVE 12/23/2022 2354   KETONESUR NEGATIVE 12/23/2022 2354   PROTEINUR NEGATIVE 12/23/2022 2354   NITRITE NEGATIVE 12/23/2022 2354   LEUKOCYTESUR NEGATIVE 12/23/2022 2354    Sepsis Labs: Lactic Acid, Venous    Component Value Date/Time   LATICACIDVEN 1.8 10/18/2022 0248    MICROBIOLOGY: Recent Results (from the past 240 hour(s))  Surgical pcr screen     Status: None   Collection Time: 01/02/23  6:07 PM   Specimen: Nasal Mucosa; Nasal Swab  Result Value Ref Range Status   MRSA, PCR NEGATIVE NEGATIVE Final   Staphylococcus aureus NEGATIVE NEGATIVE Final    Comment: (NOTE) The Xpert SA Assay (FDA approved for NASAL specimens in patients 71 years of age and older), is one component of a  comprehensive surveillance program. It is not intended to diagnose infection nor to guide or monitor treatment. Performed at [facility], [address], [place], [place] [address]     RADIOLOGY STUDIES/RESULTS: No results found.   LOS: 9 days   Jeoffrey Massed, MD  [facility]    To contact the attending provider between 7A-7P or the covering provider during after hours 7P-7A, please log into the web site [email or web address] and access using universal [place] password for that web site. If you do not have the password, please call the hospital operator.  01/03/2023, 9:22 AM

## 2023-01-03 NOTE — Transfer of Care (Signed)
Immediate Anesthesia Transfer of Care Note  Patient: Tristan Gardner  Procedure(s) Performed: Lumbar Two-Three, Lumbar Three-Four, Lumbar Four-Five Open laminectomies  Patient Location: PACU  Anesthesia Type:General  Level of Consciousness: drowsy and patient cooperative  Airway & Oxygen Therapy: Patient Spontanous Breathing  Post-op Assessment: Report given to RN and Post -op Vital signs reviewed and stable  Post vital signs: Reviewed and stable  Last Vitals:  Vitals Value Taken Time  BP 99/54 01/03/23 1120  Temp    Pulse 59 01/03/23 1124  Resp 24 01/03/23 1124  SpO2 95 % 01/03/23 1124  Vitals shown include unvalidated device data.  Last Pain:  Vitals:   01/03/23 0756  TempSrc: Oral  PainSc: 0-No pain      Patients Stated Pain Goal: 0 (01/03/23 0756)  Complications: No notable events documented.

## 2023-01-03 NOTE — TOC Progression Note (Signed)
Transition of Care [facility]) - Progression Note    Patient Details  Name: Tristan Gardner MRN: [id number] Date of Birth: 1934/09/29  Transition of [facility] CM/SW Contact  Mearl Latin, LCSW Phone Number: 01/03/2023, 2:36 PM  Clinical Narrative:    CSW spoke with patient's daughter, Burna Mortimer, and confirmed plan to discharge to [facility] for rehab when stable. CSW asked her about patient's son stating he would take patient home with him and she clarified (she is patient's stepdaughter but he raised her) that patient's son is not involved in his life and she and her sister and daughter are his caregivers. CSW assured her that patient did not like the idea of going to stay with his son and that he requested CSW communicate with Burna Mortimer. [facility] able to accept patient on Friday if stable pending insurance authorization.    Expected Discharge Plan: Skilled Nursing Facility Barriers to Discharge: Continued Medical Work up, English as a second language teacher, SNF Pending bed offer  Expected Discharge Plan and Services In-house Referral: Clinical Social Work   Post Acute Care Choice: Skilled Nursing Facility Living arrangements for the past 2 months: Single Family Home                                       Social Determinants of Health (SDOH) Interventions SDOH Screenings   Tobacco Use: Medium Risk (01/03/2023)    Readmission Risk Interventions     No data to display

## 2023-01-03 NOTE — Op Note (Signed)
PATIENT: Tristan Gardner  DAY OF SURGERY: 01/03/23   PRE-OPERATIVE DIAGNOSIS:  Lumbar stenosis   POST-OPERATIVE DIAGNOSIS:  Same   PROCEDURE:  L2-3, L3-4, L4-5 open decompressive laminectomies   SURGEON:  Surgeon(s) and Role:    [name], MD   ANESTHESIA: ETGA   BRIEF HISTORY: This is an 88 year old man who presented with worsening back and low back pain with leg weakness that prevented him from ambulation. Workup suggested this was due to his lumbar stenosis, we discussed risks at length and he wished to proceed with a decompressive laminectomy.    OPERATIVE DETAIL: The patient was taken to the operating room and anesthesia was induced by the anesthesia team. They were placed on the OR table in the prone position with padding of all pressure points. A formal time out was performed with two patient identifiers and confirmed the operative site. The operative site was marked, hair was clipped with surgical clippers, the area was then prepped and draped in a sterile fashion. A midline incision was placed to expose from L2 to L5. Subperiosteal dissection was performed bilaterally and fluoroscopy was used to confirm the surgical level.   Decompression was performed, which consisted of decompressive laminectomies at L2-3, L3-4, and L4-5 using a combination of high speed drill and rongeurs. He had extensive lateral recess stenosis, as expected from the MRI, that was also decompressed.   Hemostasis was obtained and confirmed, all instrument and sponge counts were correct, the incision was then closed in layers. The patient was then returned to anesthesia for emergence. No apparent complications at the completion of the procedure.   EBL:    DRAINS: none   SPECIMENS: none   [name], MD

## 2023-01-03 NOTE — Anesthesia Procedure Notes (Signed)
Procedure Name: Intubation Date/Time: 01/03/2023 8:52 AM  Performed by: Rosiland Oz, CRNAPre-anesthesia Checklist: Patient identified, Emergency Drugs available, Suction available, Patient being monitored and Timeout performed Patient Re-evaluated:Patient Re-evaluated prior to induction Oxygen Delivery Method: Circle system utilized Preoxygenation: Pre-oxygenation with 100% oxygen Induction Type: IV induction Ventilation: Mask ventilation without difficulty Laryngoscope Size: Auld and 3 Grade View: Grade I Tube type: Oral Tube size: 7.5 mm Number of attempts: 1 Airway Equipment and Method: Stylet Placement Confirmation: ETT inserted through vocal cords under direct vision, positive ETCO2 and breath sounds checked- equal and bilateral Secured at: 22 cm Tube secured with: Tape Dental Injury: Teeth and Oropharynx as per pre-operative assessment

## 2023-01-03 NOTE — Progress Notes (Signed)
Neurosurgery Service Post-operative progress note  Assessment & Plan: 88 y.o. man s/p L2-5 decompression.  -no issues intra-op -okay to return to regular floor bed -medicine primary, I restarted his preop orders, okay to resume regular diet and activity as tolerated, PT/OT, no restrictions, no brace needed  [name]  01/03/23 11:09 AM

## 2023-01-04 ENCOUNTER — Encounter (HOSPITAL_COMMUNITY): Payer: Self-pay | Admitting: Neurological Surgery

## 2023-01-04 DIAGNOSIS — M48061 Spinal stenosis, lumbar region without neurogenic claudication: Secondary | ICD-10-CM | POA: Diagnosis not present

## 2023-01-04 DIAGNOSIS — Z8673 Personal history of transient ischemic attack (TIA), and cerebral infarction without residual deficits: Secondary | ICD-10-CM | POA: Diagnosis not present

## 2023-01-04 DIAGNOSIS — K769 Liver disease, unspecified: Secondary | ICD-10-CM | POA: Diagnosis not present

## 2023-01-04 DIAGNOSIS — I482 Chronic atrial fibrillation, unspecified: Secondary | ICD-10-CM | POA: Diagnosis not present

## 2023-01-04 LAB — CBC
HCT: 31.8 % — ABNORMAL LOW (ref 39.0–52.0)
Hemoglobin: 9.9 g/dL — ABNORMAL LOW (ref 13.0–17.0)
MCH: 25.8 pg — ABNORMAL LOW (ref 26.0–34.0)
MCHC: 31.1 g/dL (ref 30.0–36.0)
MCV: 83 fL (ref 80.0–100.0)
Platelets: 248 10*3/uL (ref 150–400)
RBC: 3.83 MIL/uL — ABNORMAL LOW (ref 4.22–5.81)
RDW: 20.9 % — ABNORMAL HIGH (ref 11.5–15.5)
WBC: 8.3 10*3/uL (ref 4.0–10.5)
nRBC: 0 % (ref 0.0–0.2)

## 2023-01-04 LAB — BASIC METABOLIC PANEL
Anion gap: 9 (ref 5–15)
BUN: 13 mg/dL (ref 8–23)
CO2: 26 mmol/L (ref 22–32)
Calcium: 8.9 mg/dL (ref 8.9–10.3)
Chloride: 96 mmol/L — ABNORMAL LOW (ref 98–111)
Creatinine, Ser: 0.99 mg/dL (ref 0.61–1.24)
GFR, Estimated: >60 mL/min (ref >60)
Glucose, Bld: 122 mg/dL — ABNORMAL HIGH (ref 70–99)
Potassium: 4.6 mmol/L (ref 3.5–5.1)
Sodium: 131 mmol/L — ABNORMAL LOW (ref 135–145)

## 2023-01-04 LAB — TROPONIN I (HIGH SENSITIVITY)
Troponin I (High Sensitivity): 70 ng/L — ABNORMAL HIGH (ref <18)
Troponin I (High Sensitivity): 74 ng/L — ABNORMAL HIGH (ref <18)

## 2023-01-04 MED ORDER — PANTOPRAZOLE SODIUM 40 MG PO TBEC
40.0000 mg | DELAYED_RELEASE_TABLET | Freq: Every day | ORAL | Status: DC
  Administered 2023-01-04 – 2023-01-05 (×2): 40 mg via ORAL
  Filled 2023-01-04 (×2): qty 1

## 2023-01-04 MED ORDER — MORPHINE SULFATE (PF) 2 MG/ML IV SOLN
0.5000 mg | INTRAVENOUS | Status: DC | PRN
  Administered 2023-01-04 (×2): 0.5 mg via INTRAVENOUS
  Filled 2023-01-04 (×2): qty 1

## 2023-01-04 MED ORDER — OXYCODONE HCL 5 MG PO TABS
5.0000 mg | ORAL_TABLET | ORAL | Status: DC | PRN
  Administered 2023-01-04 – 2023-01-06 (×6): 5 mg via ORAL
  Filled 2023-01-04 (×6): qty 1

## 2023-01-04 MED ORDER — ALUM & MAG HYDROXIDE-SIMETH 200-200-20 MG/5ML PO SUSP
30.0000 mL | Freq: Four times a day (QID) | ORAL | Status: DC | PRN
  Administered 2023-01-04: 30 mL via ORAL
  Filled 2023-01-04: qty 30

## 2023-01-04 NOTE — Plan of Care (Signed)
  Problem: Education: Goal: Knowledge of disease or condition will improve 01/04/2023 2025 by Bonnita Levan, RN Outcome: Progressing 01/04/2023 2025 by Bonnita Levan, RN Outcome: Progressing Goal: Knowledge of secondary prevention will improve (MUST DOCUMENT ALL) 01/04/2023 2025 by Bonnita Levan, RN Outcome: Progressing 01/04/2023 2025 by Bonnita Levan, RN Outcome: Progressing Goal: Knowledge of patient specific risk factors will improve Loraine Leriche N/A or DELETE if not current risk factor) 01/04/2023 2025 by Bonnita Levan, RN Outcome: Progressing 01/04/2023 2025 by Bonnita Levan, RN Outcome: Progressing   Problem: Ischemic Stroke/TIA Tissue Perfusion: Goal: Complications of ischemic stroke/TIA will be minimized 01/04/2023 2025 by Bonnita Levan, RN Outcome: Progressing 01/04/2023 2025 by Bonnita Levan, RN Outcome: Progressing   Problem: Coping: Goal: Will verbalize positive feelings about self Outcome: Progressing Goal: Will identify appropriate support needs Outcome: Progressing   Problem: Health Behavior/Discharge Planning: Goal: Ability to manage health-related needs will improve Outcome: Progressing Goal: Goals will be collaboratively established with patient/family Outcome: Progressing   Problem: Self-Care: Goal: Ability to participate in self-care as condition permits will improve Outcome: Progressing Goal: Verbalization of feelings and concerns over difficulty with self-care will improve Outcome: Progressing Goal: Ability to communicate needs accurately will improve Outcome: Progressing   Problem: Nutrition: Goal: Risk of aspiration will decrease Outcome: Progressing Goal: Dietary intake will improve Outcome: Progressing   Problem: Education: Goal: Knowledge of General Education information will improve Description: Including pain rating scale, medication(s)/side effects and non-pharmacologic comfort measures Outcome: Progressing   Problem: Health Behavior/Discharge  Planning: Goal: Ability to manage health-related needs will improve Outcome: Progressing   Problem: Clinical Measurements: Goal: Ability to maintain clinical measurements within normal limits will improve Outcome: Progressing Goal: Will remain free from infection Outcome: Progressing Goal: Diagnostic test results will improve Outcome: Progressing Goal: Respiratory complications will improve Outcome: Progressing Goal: Cardiovascular complication will be avoided Outcome: Progressing   Problem: Activity: Goal: Risk for activity intolerance will decrease Outcome: Progressing   Problem: Nutrition: Goal: Adequate nutrition will be maintained Outcome: Progressing   Problem: Coping: Goal: Level of anxiety will decrease Outcome: Progressing   Problem: Elimination: Goal: Will not experience complications related to bowel motility Outcome: Progressing Goal: Will not experience complications related to urinary retention Outcome: Progressing   Problem: Pain Managment: Goal: General experience of comfort will improve Outcome: Progressing   Problem: Safety: Goal: Ability to remain free from injury will improve Outcome: Progressing   Problem: Skin Integrity: Goal: Risk for impaired skin integrity will decrease Outcome: Progressing

## 2023-01-04 NOTE — TOC Progression Note (Signed)
Transition of Care [facility]) - Initial/Assessment Note    Patient Details  Name: Tristan Gardner MRN: [id number] Date of Birth: January 04, 1935  Transition [facility]) CM/SW Contact:    Ralene Bathe, LCSW Phone Number: 01/04/2023, 2:10 PM  Clinical Narrative:                 LCSW attempted to initiated insurance authorization.  The insurance company is closed for the holiday and LCSW is unable to complete request via online portal.  TOC following.    Expected Discharge Plan: Skilled Nursing Facility Barriers to Discharge: Continued Medical Work up, English as a second language teacher, SNF Pending bed offer   Patient Goals and CMS Choice Patient states their goals for this hospitalization and ongoing recovery are:: Rehab CMS Medicare.gov Compare Post Acute Care list provided to:: Patient Choice offered to / list presented to : Patient [place] ownership interest in [facility].provided to:: Patient    Expected Discharge Plan and Services In-house Referral: Clinical Social Work   Post Acute Care Choice: Skilled Nursing Facility Living arrangements for the past 2 months: Single Family Home                                      Prior Living Arrangements/Services Living arrangements for the past 2 months: Single Family Home Lives with:: Other (Comment) (Granddaughter) Patient language and need for interpreter reviewed:: Yes Do you feel safe going back to the place where you live?: Yes      Need for Family Participation in Patient Care: Yes (Comment) Care giver support system in place?: Yes (comment) Current home services: DME (rollator/ cane/ shower seat) Criminal Activity/Legal Involvement Pertinent to Current Situation/Hospitalization: No - Comment as needed  Activities of Daily Living      Permission Sought/Granted Permission sought to share information with : Facility Medical sales representative, Family Supports Permission granted to share information with : Yes, Verbal  Permission Granted     Permission granted to share info w AGENCY: SNFs  Permission granted to share info w Relationship: Daughter     Emotional Assessment Appearance:: Appears stated age Attitude/Demeanor/Rapport: Engaged, Gracious Affect (typically observed): Accepting, Appropriate, Pleasant Orientation: : Oriented to Self, Oriented to Place, Oriented to  Time, Oriented to Situation Alcohol / Substance Use: Not Applicable Psych Involvement: No (comment)  Admission diagnosis:  Weakness [R53.1] Near syncope [R55] Neurological deficit, transient [R29.818] Patient Active Problem List   Diagnosis Date Noted   Pain, dental 12/29/2022   Hepatic lesion 12/29/2022   Dyslipidemia 12/29/2022   Prediabetes 12/29/2022   Class 1 obesity due to excess calories with body mass index (BMI) of 34.0 to 34.9 in adult 12/29/2022   Lumbar spinal stenosis 12/28/2022   Near syncope 12/24/2022   Neurological deficit, transient 12/24/2022   Essential hypertension 12/24/2022   History of CVA (cerebrovascular accident) 12/24/2022   Peripheral neuropathy 12/24/2022   Acute gastric ulcer with hemorrhage 10/23/2022   Gastrointestinal hemorrhage 10/23/2022   Iron deficiency anemia 10/19/2022   Heme positive stool 10/19/2022   Symptomatic anemia 10/18/2022   Chest pain, rule out acute myocardial infarction 08/06/2020   Elevated blood pressure reading 08/06/2020   H/O iron deficiency anemia 08/06/2020   Atrial flutter (HCC) 05/18/2020   Viral URI with cough 05/18/2020   Atypical chest pain 05/17/2020   Chronic atrial fibrillation with RVR (HCC) 03/11/2020   PCP:  Porfirio Oar, PA Pharmacy:   [facility] [phone] -  [place] ([place]), [place] - [address] [place] [address] Phone: [phone] Fax: [phone]  [facility] [address] [place] [address] Phone: [phone] Fax: [phone]     Social Determinants  of Health (SDOH) Social History: SDOH Screenings   Food Insecurity: No Food Insecurity (01/04/2023)  Housing: Low Risk  (01/04/2023)  Transportation Needs: No Transportation Needs (01/04/2023)  Utilities: Not At Risk (01/04/2023)  Tobacco Use: Medium Risk (01/04/2023)   SDOH Interventions:     Readmission Risk Interventions     No data to display

## 2023-01-04 NOTE — Evaluation (Addendum)
Physical Therapy Re-Evaluation Patient Details Name: Tristan Gardner MRN: [id number] DOB: 1934/09/01 Today's Date: 01/04/2023  History of Present Illness  88 y.o. male presenting 6/22 with inability to bear weight in L or R LE, frequent falls, R sided facial droop, and intermittent ataxia. Currently s/p lumbar L2-L5 laminectomy decompression (01/03/2023). PMH significant for PMH includes: afib, CAD, HTN, NSTEMI, CVA, sciatica, chronic pain, and obesity.   Clinical Impression  Patient received seated upright in hospital chair. Educated and reviewed spinal precautions prior to mobility. Incision drainage with initial standing transfer; RN notified and bandage applied. Patient able to perform sit <> stand transfer x2 with modA. Verbal cue for pelvic extension with upright stance. Heavy reliance on RW for standing and dynamic activities. Session limited due to pain, recommending next session to progress transfer and gait training. Anticipating patient to benefit from skilled rehab < 3 hours in order to facilitate improvements with functional mobility.       Assistance Recommended at Discharge Frequent or constant Supervision/Assistance  If plan is discharge home, recommend the following:  Can travel by private vehicle  A little help with bathing/dressing/bathroom;Assistance with cooking/housework;Assist for transportation;Help with stairs or ramp for entrance;A little help with walking and/or transfers   No    Equipment Recommendations Rolling walker (2 wheels)  Recommendations for Other Services       Functional Status Assessment Patient has had a recent decline in their functional status and demonstrates the ability to make significant improvements in function in a reasonable and predictable amount of time.     Precautions / Restrictions Precautions Precautions: Fall;Back Precaution Booklet Issued: Yes (comment) Precaution Comments: Unable to recall spinal precautions from previous  session. Reviewed prior to mobility. Restrictions Weight Bearing Restrictions: No      Mobility  Bed Mobility               General bed mobility comments: Patient received sitting upright in chair, ended session in chair    Transfers Overall transfer level: Needs assistance Equipment used: Rolling walker (2 wheels) Transfers: Sit to/from Stand Sit to Stand: Mod assist           General transfer comment: Intial sit to stand with modA. Able to progress to minA with continued trials. Verbally cued for upright stance and pelvic extension. Patient unsteady when upright, increased pain in left hip upon standing.    Ambulation/Gait             Pre-gait activities: Performed 2 steps forward and backwards. Patient reported pain with LLE during weight shift; instance of minor knee buckle before return to chair. General Gait Details: Deferred this session due to pain and LE imbalance.  Stairs            Wheelchair Mobility     Tilt Bed    Modified Rankin (Stroke Patients Only)       Balance Overall balance assessment: Needs assistance Sitting-balance support: Feet supported, No upper extremity supported Sitting balance-Leahy Scale: Good Sitting balance - Comments: Able to set edge of recliner   Standing balance support: Bilateral upper extremity supported, Reliant on assistive device for balance Standing balance-Leahy Scale: Poor Standing balance comment: Heavy reliance on RW for static standing.                             Pertinent Vitals/Pain Pain Assessment Pain Assessment: 0-10 Pain Score: 8  Pain Location: Incision, LLE, Stomach, Right Arm Pain Descriptors / Indicators:  Grimacing, Guarding, Operative site guarding Pain Intervention(s): Limited activity within patient's tolerance, Monitored during session    Home Living Family/patient expects to be discharged to:: Skilled nursing facility Living Arrangements: Children  (Daughter) Available Help at Discharge: Family;Available PRN/intermittently Type of Home: House Home Access: Level entry       Home Layout: One level Home Equipment: Rollator (4 wheels);Shower seat Additional Comments: lives with granddaughter who works at day care so he is alone during day    Prior Function Prior Level of Function : Needs assist             Mobility Comments: Utilizes a rollator for mobility in his home and outside, utilizing scooters available in the community for community distances. Reports 2 recent falls ADLs Comments: pt reports daughter works at daycare and is not home during day, states he has difficulty in bathroom. pt sponge bathes     Hand Dominance   Dominant Hand: Right    Extremity/Trunk Assessment   Upper Extremity Assessment Upper Extremity Assessment: Defer to OT evaluation    Lower Extremity Assessment Lower Extremity Assessment: Generalized weakness LLE Deficits / Details: Patient reports LLE weaker when compared to RLE.    Cervical / Trunk Assessment Cervical / Trunk Assessment: Back Surgery;Kyphotic  Communication   Communication: HOH  Cognition Arousal/Alertness: Awake/alert Behavior During Therapy: WFL for tasks assessed/performed Overall Cognitive Status: Within Functional Limits for tasks assessed                                          General Comments General comments (skin integrity, edema, etc.): Incision site drainage, RN notified and bandage placed during session.    Exercises     Assessment/Plan    PT Assessment Patient needs continued PT services  PT Problem List Decreased strength;Decreased activity tolerance;Decreased balance;Decreased mobility;Decreased coordination       PT Treatment Interventions DME instruction;Gait training;Stair training;Functional mobility training;Therapeutic activities;Therapeutic exercise;Balance training;Neuromuscular re-education;Patient/family education     PT Goals (Current goals can be found in the Care Plan section)  Acute Rehab PT Goals Patient Stated Goal: Improve in walking and strength to return to independent status PT Goal Formulation: With patient Time For Goal Achievement: 01/18/23 Potential to Achieve Goals: Good    Frequency Min 4X/week     Co-evaluation               AM-PAC PT "6 Clicks" Mobility  Outcome Measure Help needed turning from your back to your side while in a flat bed without using bedrails?: A Little Help needed moving from lying on your back to sitting on the side of a flat bed without using bedrails?: A Little Help needed moving to and from a bed to a chair (including a wheelchair)?: A Lot Help needed standing up from a chair using your arms (e.g., wheelchair or bedside chair)?: A Lot Help needed to walk in hospital room?: A Lot Help needed climbing 3-5 steps with a railing? : A Lot 6 Click Score: 14    End of Session Equipment Utilized During Treatment: Gait belt Activity Tolerance: Patient limited by pain Patient left: in chair Nurse Communication: Mobility status PT Visit Diagnosis: Unsteadiness on feet (R26.81);Other abnormalities of gait and mobility (R26.89);Muscle weakness (generalized) (M62.81)    Time: 0454-0981 PT Time Calculation (min) (ACUTE ONLY): 25 min   Charges:   PT Evaluation $PT Re-evaluation: 1 Re-eval   PT  General Charges $$ ACUTE PT VISIT: 1 Visit         Christene Lye, SPT Acute Rehabilitation Services [phone] Secure chat preferred    Christene Lye 01/04/2023, 1:24 PM

## 2023-01-04 NOTE — Progress Notes (Signed)
  NEUROSURGERY PROGRESS NOTE   No issues overnight. Pt with minimal pain this am. Reports some "twitching" in his legs.  EXAM:  BP (!) 133/55 (BP Location: Right Arm)   Pulse 84   Temp 99.6 F (37.6 C) (Oral)   Resp 18   Ht 5\' 11"  (1.803 m)   Wt 113.4 kg   SpO2 95%   BMI 34.87 kg/m   Awake, alert, oriented  Speech fluent, appropriate  CN grossly intact  5/5 RLE 4/5 Left hip flexor, 4-/5 left DF Wound c/d/i  IMPRESSION:  88 y.o. male POD#1 L2-5 laminectomy for stenosis, at baseline  PLAN: - Cont supportive care - Cont therapy - Likely SNF placement   Lisbeth Renshaw, MD [facility]

## 2023-01-04 NOTE — Progress Notes (Signed)
PROGRESS NOTE        PATIENT DETAILS Name: Tristan Gardner Age: 88 y.o. Sex: male Date of Birth: 01/24/1935 Admit Date: 12/23/2022 Admitting Physician Frankey Shown, DO ZOX:[id number], [place], PA  Brief Summary: Patient is a 88 y.o.  male with history of A-fib on Xarelto, CVA on Plavix-who presented with fall/generalized weakness-upon further evaluation was found to have severe lumbar stenosis on CT myelogram.  Significant events: 6/22>> admit to [facility]  Significant studies: 6/22>> x-ray pelvis: No fracture 6/22 >>x-ray tibia/fibula: No fracture 6/22>> CT head: No acute intracranial abnormality 6/22>> CT C-spine: No fracture 6/23>> CT T/L-spine: Motion degraded study-no fracture/traumatic subluxation. 6/24>> echo: EF 50-55% 6/26>> CT myelography C-spine: No compressive central canal stenosis, degenerative disc disease. 6/26>> CT myelography T-spine: No cord compression 6/26>> CT myelography L-spine: Severe multifactorial spinal stenosis at L3-4 and L4-5.  Significant microbiology data: None  Procedures: 6/26>> CT myelogram 7/03>> L2-L5 laminectomy  Consults: Neurology Neurosurgery  Subjective: Some pain at the postoperative site-had epigastric discomfort earlier this morning..  Objective: Vitals: Blood pressure (!) 101/56, pulse 92, temperature 100.2 F (37.9 C), temperature source Oral, resp. rate 19, height 5\' 11"  (1.803 m), weight 113.4 kg, SpO2 96 %.   Exam: Gen Exam:Alert awake-not in any distress HEENT:atraumatic, normocephalic Chest: B/L clear to auscultation anteriorly CVS:S1S2 regular Abdomen:soft non tender, non distended Extremities:no edema Neurology: Mild LLE weakness persists. Skin: no rash  Pertinent Labs/Radiology:    Latest Ref Rng & Units 01/04/2023    1:49 AM 01/02/2023    3:25 AM 01/01/2023    4:56 AM  CBC  WBC 4.0 - 10.5 K/uL 8.3  4.3  3.2   Hemoglobin 13.0 - 17.0 g/dL 9.9  9.7  09.8   Hematocrit 39.0 - 52.0 % 31.8   31.7  32.4   Platelets 150 - 400 K/uL 248  239  228     Lab Results  Component Value Date   NA 131 (L) 01/04/2023   K 4.6 01/04/2023   CL 96 (L) 01/04/2023   CO2 26 01/04/2023    Assessment/Plan: Worsening back pain-LLE weakness Felt to be secondary to severe canal stenosis at L3-L4, and L4-L5 S/p decompressive laminectomy on 7/3 Mobilize with PT/OT Neurosurgery following.  Right-sided dental pain secondary to dental caries Better with Orajel Outpatient follow-up with primary dentist (unfortunately no dental or oral surgery coverage available at [facility])  Peripheral neuropathy Neurontin  Chronic atrial fibrillation Metoprolol for rate control Xarelto held for decompressive laminectomy 7/2-resume when okay with neurosurgery  History of CVA Plavix on hold-resume when okay with neurosurgery Continues to 10  HTN Stable Imdur/losartan/Toprol  HLD Lipitor  GERD Epigastric pain earlier this morning-twelve-lead EKG/troponin stable Better after Maalox Starting PPI  Prediabetes (A1c 6.1 on 6/24)  follow CBGs/fasting glucose  Bladder wall thickening Incidental finding on imaging studies No clinical symptomatology consistent with UTI  7 mm hypodense lesion in the inferior aspect of right hepatic lobe Incidental finding on imaging studies Stable for outpatient follow-up with PCP LFT stable  Obesity: Estimated body mass index is 34.87 kg/m as calculated from the following:   Height as of this encounter: 5\' 11"  (1.803 m).   Weight as of this encounter: 113.4 kg.   Code status:   Code Status: Full Code   DVT Prophylaxis: SCDs Start: 12/24/22 0856 SCDs Start: 12/24/22 0153   Family Communication: None at bedside  Disposition Plan: Status is: Inpatient Remains inpatient appropriate because: Severity of illness   Planned Discharge Destination:SNF   Diet: Diet Order             Diet regular Room service appropriate? Yes; Fluid consistency: Thin  Diet  effective now                     Antimicrobial agents: Anti-infectives (From admission, onward)    Start     Dose/Rate Route Frequency Ordered Stop   01/03/23 0808  ceFAZolin (ANCEF) 2-4 GM/100ML-% IVPB       Note to Pharmacy: Shanda Bumps M: cabinet override      01/03/23 0808 01/03/23 0934   01/03/23 0800  ceFAZolin (ANCEF) IVPB 2g/100 mL premix        2 g 200 mL/hr over 30 Minutes Intravenous On call to O.R. 01/03/23 0754 01/03/23 0901   01/03/23 0754  ceFAZolin (ANCEF) IVPB 2g/100 mL premix  Status:  Discontinued        2 g 200 mL/hr over 30 Minutes Intravenous 30 min pre-op 01/03/23 0754 01/03/23 0755        MEDICATIONS: Scheduled Meds:  atorvastatin  40 mg Oral QHS   baclofen  10 mg Oral TID   DULoxetine  30 mg Oral BID   gabapentin  300 mg Oral TID   hydrALAZINE  50 mg Oral Q8H   isosorbide mononitrate  30 mg Oral Daily   losartan  25 mg Oral Daily   metoprolol succinate  50 mg Oral Daily   traZODone  50 mg Oral QHS   Continuous Infusions:  PRN Meds:.acetaminophen **OR** acetaminophen, albuterol, alum & mag hydroxide-simeth, benzocaine, hydrALAZINE, ondansetron **OR** ondansetron (ZOFRAN) IV, mouth rinse   I have personally reviewed following labs and imaging studies  LABORATORY DATA: CBC: Recent Labs  Lab 12/30/22 0335 01/01/23 0456 01/02/23 0325 01/04/23 0149  WBC 3.9* 3.2* 4.3 8.3  NEUTROABS 2.0 1.6*  --   --   HGB 10.2* 10.1* 9.7* 9.9*  HCT 32.4* 32.4* 31.7* 31.8*  MCV 80.6 81.0 82.1 83.0  PLT 231 228 239 248     Basic Metabolic Panel: Recent Labs  Lab 12/30/22 0335 01/01/23 0456 01/02/23 0325 01/04/23 0149  NA 136 133* 134* 131*  K 3.9 4.2 4.6 4.6  CL 102 100 101 96*  CO2 23 24 25 26   GLUCOSE 118* 141* 130* 122*  BUN 12 12 15 13   CREATININE 1.01 0.97 1.18 0.99  CALCIUM 8.6* 8.4* 8.5* 8.9     GFR: Estimated Creatinine Clearance: 69.8 mL/min (by C-G formula based on SCr of 0.99 mg/dL).  Liver Function Tests: No  results for input(s): "AST", "ALT", "ALKPHOS", "BILITOT", "PROT", "ALBUMIN" in the last 168 hours. No results for input(s): "LIPASE", "AMYLASE" in the last 168 hours. No results for input(s): "AMMONIA" in the last 168 hours.  Coagulation Profile: No results for input(s): "INR", "PROTIME" in the last 168 hours.  Cardiac Enzymes: No results for input(s): "CKTOTAL", "CKMB", "CKMBINDEX", "TROPONINI" in the last 168 hours.  BNP (last 3 results) No results for input(s): "PROBNP" in the last 8760 hours.  Lipid Profile: No results for input(s): "CHOL", "HDL", "LDLCALC", "TRIG", "CHOLHDL", "LDLDIRECT" in the last 72 hours.  Thyroid Function Tests: No results for input(s): "TSH", "T4TOTAL", "FREET4", "T3FREE", "THYROIDAB" in the last 72 hours.  Anemia Panel: No results for input(s): "VITAMINB12", "FOLATE", "FERRITIN", "TIBC", "IRON", "RETICCTPCT" in the last 72 hours.  Urine analysis:    Component Value Date/Time  COLORURINE STRAW (A) 12/23/2022 2354   APPEARANCEUR CLEAR 12/23/2022 2354   LABSPEC 1.004 (L) 12/23/2022 2354   PHURINE 6.0 12/23/2022 2354   GLUCOSEU NEGATIVE 12/23/2022 2354   HGBUR NEGATIVE 12/23/2022 2354   BILIRUBINUR NEGATIVE 12/23/2022 2354   KETONESUR NEGATIVE 12/23/2022 2354   PROTEINUR NEGATIVE 12/23/2022 2354   NITRITE NEGATIVE 12/23/2022 2354   LEUKOCYTESUR NEGATIVE 12/23/2022 2354    Sepsis Labs: Lactic Acid, Venous    Component Value Date/Time   LATICACIDVEN 1.8 10/18/2022 0248    MICROBIOLOGY: Recent Results (from the past 240 hour(s))  Surgical pcr screen     Status: None   Collection Time: 01/02/23  6:07 PM   Specimen: Nasal Mucosa; Nasal Swab  Result Value Ref Range Status   MRSA, PCR NEGATIVE NEGATIVE Final   Staphylococcus aureus NEGATIVE NEGATIVE Final    Comment: (NOTE) The Xpert SA Assay (FDA approved for NASAL specimens in patients 67 years of age and older), is one component of a comprehensive surveillance program. It is not intended  to diagnose infection nor to guide or monitor treatment. Performed at [facility], [address], [place], [place] [address]     RADIOLOGY STUDIES/RESULTS: DG Lumbar Spine 1 View  Result Date: 01/03/2023 CLINICAL DATA:  Elective surgery. EXAM: LUMBAR SPINE - 1 VIEW COMPARISON:  CT 12/27/2022 FINDINGS: Tissue spreaders in place posteriorly with a probe directed at the L3-4 disc level. IMPRESSION: Surgical probe directed at the L3-4 disc level. Electronically Signed   By: Paulina Fusi M.D.   On: 01/03/2023 12:44   DG C-Arm 1-60 Min-No Report  Result Date: 01/03/2023 Fluoroscopy was utilized by the requesting physician.  No radiographic interpretation.     LOS: 10 days   Jeoffrey Massed, MD  [facility]    To contact the attending provider between 7A-7P or the covering provider during after hours 7P-7A, please log into the web site [email or web address] and access using universal [place] password for that web site. If you do not have the password, please call the hospital operator.  01/04/2023, 12:54 PM

## 2023-01-04 NOTE — Evaluation (Signed)
Occupational Therapy Re-Evaluation Patient Details Name: Tristan Gardner MRN: [id number] DOB: [birth year] Today's Date: 01/04/2023   History of Present Illness The pt is an 88 yo male presenting 6/22 with inability to bear weight in L or R LE, frequent falls, R sided facial droop, and intermittent ataxia. CT showed no acute abnormality, pt unable to get MRI. Underwent L 2-5 decompression.PMH includes: afib, CAD, HTN, NSTEMI, CVA, sciatica, chronic pain, and obesity.   Clinical Impression   Pt seen s/p lumbar decompression. Up in chair and needing mod assist and increased time to stand with RW. Completed grooming with set up. Needs min to total assist for bathing, dressing and toileting. Educated pt in benefits of pillows under knees to reduce pressure on back and importance of frequent position changes. Provided and reviewed back precaution handout.Patient will benefit from continued inpatient follow up therapy, <3 hours/day.     Recommendations for follow up therapy are one component of a multi-disciplinary discharge planning process, led by the attending physician.  Recommendations may be updated based on patient status, additional functional criteria and insurance authorization.   Assistance Recommended at Discharge Frequent or constant Supervision/Assistance  Patient can return home with the following Assistance with cooking/housework;Direct supervision/assist for medications management;Direct supervision/assist for financial management;Assist for transportation;Two people to help with walking and/or transfers;A lot of help with bathing/dressing/bathroom    Functional Status Assessment  Patient has had a recent decline in their functional status and demonstrates the ability to make significant improvements in function in a reasonable and predictable amount of time.  Equipment Recommendations  Other (comment) (defer)    Recommendations for Other Services       Precautions / Restrictions  Precautions Precautions: Fall;Back Precaution Booklet Issued: Yes (comment) Precaution Comments: educated in back precautions Restrictions Weight Bearing Restrictions: No      Mobility Bed Mobility               General bed mobility comments: pt in chair upon arrival, ended session with pt in chair    Transfers Overall transfer level: Needs assistance Equipment used: Rolling walker (2 wheels) Transfers: Sit to/from Stand Sit to Stand: Mod assist           General transfer comment: increased time, mod assist to rise and steady      Balance Overall balance assessment: Needs assistance   Sitting balance-Leahy Scale: Good     Standing balance support: During functional activity, Bilateral upper extremity supported, Reliant on assistive device for balance, No upper extremity supported Standing balance-Leahy Scale: Poor Standing balance comment: unable to release walker in static standing, heavy reliance on UEs                           ADL either performed or assessed with clinical judgement   ADL Overall ADL's : Needs assistance/impaired Eating/Feeding: Independent   Grooming: Wash/dry hands;Wash/dry face;Oral care;Sitting;Set up   Upper Body Bathing: Minimal assistance;Sitting   Lower Body Bathing: Total assistance;Sit to/from stand   Upper Body Dressing : Set up;Sitting   Lower Body Dressing: Total assistance;Sit to/from stand       Toileting- Architect and Hygiene: Total assistance;Sit to/from stand               Vision Baseline Vision/History: 1 Wears glasses Ability to See in Adequate Light: 0 Adequate       Perception     Praxis      Pertinent Vitals/Pain Pain Assessment Pain Assessment:  Faces Faces Pain Scale: Hurts even more Pain Location: back, LEs Pain Descriptors / Indicators: Grimacing, Guarding Pain Intervention(s): Monitored during session, Repositioned     Hand Dominance Right   Extremity/Trunk  Assessment Upper Extremity Assessment Upper Extremity Assessment: Overall WFL for tasks assessed   Lower Extremity Assessment Lower Extremity Assessment: Defer to PT evaluation   Cervical / Trunk Assessment Cervical / Trunk Assessment: Back Surgery;Kyphotic   Communication Communication Communication: HOH   Cognition Arousal/Alertness: Awake/alert Behavior During Therapy: WFL for tasks assessed/performed Overall Cognitive Status: Within Functional Limits for tasks assessed                                       General Comments       Exercises     Shoulder Instructions      Home Living Family/patient expects to be discharged to:: Skilled nursing facility                                 Additional Comments: lives with granddaughter who works at day care so he is alone during day      Prior Functioning/Environment Prior Level of Function : Needs assist             Mobility Comments: Utilizes a rollator for mobility in his home and outside, utilizing scooters available in the community for community distances. Reports 2 recent falls ADLs Comments: pt reports daughter works at daycare and is not home during day, states he has difficulty in bathroom. pt sponge bathes        OT Problem List: Decreased strength;Impaired balance (sitting and/or standing);Decreased knowledge of use of DME or AE;Decreased knowledge of precautions;Impaired sensation;Obesity;Pain      OT Treatment/Interventions: Self-care/ADL training;DME and/or AE instruction;Balance training;Therapeutic activities    OT Goals(Current goals can be found in the care plan section) Acute Rehab OT Goals OT Goal Formulation: With patient Time For Goal Achievement: 01/18/23 Potential to Achieve Goals: Good ADL Goals Pt Will Perform Lower Body Bathing: with min assist;sit to/from stand;with adaptive equipment Pt Will Perform Lower Body Dressing: with min assist;sit to/from  stand;with adaptive equipment  OT Frequency: Min 1X/week    Co-evaluation              AM-PAC OT "6 Clicks" Daily Activity     Outcome Measure Help from another person eating meals?: None Help from another person taking care of personal grooming?: A Little Help from another person toileting, which includes using toliet, bedpan, or urinal?: Total Help from another person bathing (including washing, rinsing, drying)?: A Lot Help from another person to put on and taking off regular upper body clothing?: A Little Help from another person to put on and taking off regular lower body clothing?: Total 6 Click Score: 14   End of Session Equipment Utilized During Treatment: Gait belt;Rolling walker (2 wheels)  Activity Tolerance: Patient tolerated treatment well Patient left: in chair;with call bell/phone within reach;with chair alarm set  OT Visit Diagnosis: Unsteadiness on feet (R26.81);Other abnormalities of gait and mobility (R26.89);Pain;Muscle weakness (generalized) (M62.81);History of falling (Z91.81)                Time: 5284-1324 OT Time Calculation (min): 32 min Charges:  OT General Charges $OT Visit: 1 Visit OT Evaluation $OT Re-eval: 1 Re-eval OT Treatments $Self Care/Home Management : 8-22 mins  Berna Spare, OTR/L Acute Rehabilitation Services Office: [phone]  Evern Bio 01/04/2023, 11:05 AM

## 2023-01-05 DIAGNOSIS — I483 Typical atrial flutter: Secondary | ICD-10-CM

## 2023-01-05 DIAGNOSIS — I10 Essential (primary) hypertension: Secondary | ICD-10-CM | POA: Diagnosis not present

## 2023-01-05 DIAGNOSIS — K769 Liver disease, unspecified: Secondary | ICD-10-CM | POA: Diagnosis not present

## 2023-01-05 DIAGNOSIS — R55 Syncope and collapse: Secondary | ICD-10-CM | POA: Diagnosis not present

## 2023-01-05 LAB — CBC
HCT: 28.4 % — ABNORMAL LOW (ref 39.0–52.0)
Hemoglobin: 9 g/dL — ABNORMAL LOW (ref 13.0–17.0)
MCH: 26.1 pg (ref 26.0–34.0)
MCHC: 31.7 g/dL (ref 30.0–36.0)
MCV: 82.3 fL (ref 80.0–100.0)
Platelets: 221 10*3/uL (ref 150–400)
RBC: 3.45 MIL/uL — ABNORMAL LOW (ref 4.22–5.81)
RDW: 21.4 % — ABNORMAL HIGH (ref 11.5–15.5)
WBC: 9 10*3/uL (ref 4.0–10.5)
nRBC: 0 % (ref 0.0–0.2)

## 2023-01-05 LAB — BASIC METABOLIC PANEL
Anion gap: 7 (ref 5–15)
BUN: 14 mg/dL (ref 8–23)
CO2: 26 mmol/L (ref 22–32)
Calcium: 8.4 mg/dL — ABNORMAL LOW (ref 8.9–10.3)
Chloride: 95 mmol/L — ABNORMAL LOW (ref 98–111)
Creatinine, Ser: 1.01 mg/dL (ref 0.61–1.24)
GFR, Estimated: >60 mL/min (ref >60)
Glucose, Bld: 140 mg/dL — ABNORMAL HIGH (ref 70–99)
Potassium: 4.4 mmol/L (ref 3.5–5.1)
Sodium: 128 mmol/L — ABNORMAL LOW (ref 135–145)

## 2023-01-05 MED ORDER — ENOXAPARIN SODIUM 40 MG/0.4ML IJ SOSY
40.0000 mg | PREFILLED_SYRINGE | INTRAMUSCULAR | Status: DC
  Administered 2023-01-05: 40 mg via SUBCUTANEOUS
  Filled 2023-01-05: qty 0.4

## 2023-01-05 NOTE — Plan of Care (Signed)

## 2023-01-05 NOTE — Progress Notes (Signed)
PROGRESS NOTE        PATIENT DETAILS Name: Tristan Gardner Age: 88 y.o. Sex: male Date of Birth: 1935/02/10 Admit Date: 12/23/2022 Admitting Physician Frankey Shown, DO AVW:[id number], [place], [place]  Brief Summary: Patient is a 88 y.o.  male with history of A-fib on Xarelto, CVA on Plavix-who presented with fall/generalized weakness-upon further evaluation was found to have severe lumbar stenosis on CT myelogram.  Significant events: 6/22>> admit to [facility]  Significant studies: 6/22>> x-ray pelvis: No fracture 6/22 >>x-ray tibia/fibula: No fracture 6/22>> CT head: No acute intracranial abnormality 6/22>> CT C-spine: No fracture 6/23>> CT T/L-spine: Motion degraded study-no fracture/traumatic subluxation. 6/24>> echo: EF 50-55% 6/26>> CT myelography C-spine: No compressive central canal stenosis, degenerative disc disease. 6/26>> CT myelography T-spine: No cord compression 6/26>> CT myelography L-spine: Severe multifactorial spinal stenosis at L3-4 and L4-5.  Significant microbiology data: None  Procedures: 6/26>> CT myelogram 7/03>> L2-L5 laminectomy  Consults: Neurology Neurosurgery  Subjective: No major issues overnight-lying comfortably in bed.  Epigastric pain that he had on 7/4 has essentially resolved.  Objective: Vitals: Blood pressure (!) 96/44, pulse 61, temperature 98.2 F (36.8 C), temperature source Oral, resp. rate 17, height 5\' 11"  (1.803 m), weight 113.4 kg, SpO2 97 %.   Exam: Gen Exam:Alert awake-not in any distress HEENT:atraumatic, normocephalic Chest: B/L clear to auscultation anteriorly CVS:S1S2 regular Abdomen:soft non tender, non distended Extremities: Mild left-sided weakness Neurology: Non focal Skin: no rash  Pertinent Labs/Radiology:    Latest Ref Rng & Units 01/05/2023    4:19 AM 01/04/2023    1:49 AM 01/02/2023    3:25 AM  CBC  WBC 4.0 - 10.5 K/uL 9.0  8.3  4.3   Hemoglobin 13.0 - 17.0 g/dL 9.0  9.9  9.7    Hematocrit 39.0 - 52.0 % 28.4  31.8  31.7   Platelets 150 - 400 K/uL 221  248  239     Lab Results  Component Value Date   NA 128 (L) 01/05/2023   K 4.4 01/05/2023   CL 95 (L) 01/05/2023   CO2 26 01/05/2023    Assessment/Plan: Worsening back pain-LLE weakness Felt to be secondary to severe canal stenosis at L3-L4, and L4-L5 S/p decompressive laminectomy on 7/3 Mobilize with PT/OT Neurosurgery following.  Right-sided dental pain secondary to dental caries Better with Orajel Outpatient follow-up with primary dentist (unfortunately no dental or oral surgery coverage available at [facility])  Hyponatremia Mild Euvolemic Acknowledges significant oral intake of water yesterday Fluid restrict Recheck electrolytes tomorrow  Peripheral neuropathy Neurontin  Chronic atrial fibrillation Metoprolol for rate control Xarelto on hold-discussed with neurosurgery-Dr. Loni Dolly in 1 week from 7/3-tentatively on 01/10/2023  History of CVA Plavix on hold-discussed with neurosurgery-Dr. Loni Dolly 1 week from 7/3-tentatively on 01/10/2023  HTN Stable Imdur/losartan/Toprol  HLD Lipitor  GERD Had epigastric gastric pain 7/4 Resolved with supportive care-now on PPI  Prediabetes (A1c 6.1 on 6/24)  follow CBGs/fasting glucose  Bladder wall thickening Incidental finding on imaging studies No clinical symptomatology consistent with UTI  7 mm hypodense lesion in the inferior aspect of right hepatic lobe Incidental finding on imaging studies Stable for outpatient follow-up with PCP LFT stable  Obesity: Estimated body mass index is 34.87 kg/m as calculated from the following:   Height as of this encounter: 5\' 11"  (1.803 m).   Weight as of this encounter: 113.4 kg.  Code status:   Code Status: Full Code   DVT Prophylaxis: enoxaparin (LOVENOX) injection 40 mg Start: 01/05/23 0945 SCDs Start: 12/24/22 0856 SCDs Start: 12/24/22 0153   Family Communication: None at  bedside   Disposition Plan: Status is: Inpatient Remains inpatient appropriate because: Severity of illness   Planned Discharge Destination:SNF   Diet: Diet Order             Diet Heart Room service appropriate? Yes; Fluid consistency: Thin; Fluid restriction: 1500 mL Fluid  Diet effective now                     Antimicrobial agents: Anti-infectives (From admission, onward)    Start     Dose/Rate Route Frequency Ordered Stop   01/03/23 0808  ceFAZolin (ANCEF) 2-4 GM/100ML-% IVPB       Note to Pharmacy: Shanda Bumps M: cabinet override      01/03/23 0808 01/03/23 0934   01/03/23 0800  ceFAZolin (ANCEF) IVPB 2g/100 mL premix        2 g 200 mL/hr over 30 Minutes Intravenous On call to O.R. 01/03/23 1610 01/03/23 0901   01/03/23 0754  ceFAZolin (ANCEF) IVPB 2g/100 mL premix  Status:  Discontinued        2 g 200 mL/hr over 30 Minutes Intravenous 30 min pre-op 01/03/23 0754 01/03/23 0755        MEDICATIONS: Scheduled Meds:  atorvastatin  40 mg Oral QHS   baclofen  10 mg Oral TID   DULoxetine  30 mg Oral BID   enoxaparin (LOVENOX) injection  40 mg Subcutaneous Q24H   gabapentin  300 mg Oral TID   hydrALAZINE  50 mg Oral Q8H   isosorbide mononitrate  30 mg Oral Daily   losartan  25 mg Oral Daily   metoprolol succinate  50 mg Oral Daily   pantoprazole  40 mg Oral Q1200   traZODone  50 mg Oral QHS   Continuous Infusions:  PRN Meds:.acetaminophen **OR** acetaminophen, albuterol, alum & mag hydroxide-simeth, benzocaine, hydrALAZINE, morphine injection, ondansetron **OR** ondansetron (ZOFRAN) IV, mouth rinse, oxyCODONE   I have personally reviewed following labs and imaging studies  LABORATORY DATA: CBC: Recent Labs  Lab 12/30/22 0335 01/01/23 0456 01/02/23 0325 01/04/23 0149 01/05/23 0419  WBC 3.9* 3.2* 4.3 8.3 9.0  NEUTROABS 2.0 1.6*  --   --   --   HGB 10.2* 10.1* 9.7* 9.9* 9.0*  HCT 32.4* 32.4* 31.7* 31.8* 28.4*  MCV 80.6 81.0 82.1 83.0 82.3   PLT 231 228 239 248 221     Basic Metabolic Panel: Recent Labs  Lab 12/30/22 0335 01/01/23 0456 01/02/23 0325 01/04/23 0149 01/05/23 0419  NA 136 133* 134* 131* 128*  K 3.9 4.2 4.6 4.6 4.4  CL 102 100 101 96* 95*  CO2 23 24 25 26 26   GLUCOSE 118* 141* 130* 122* 140*  BUN 12 12 15 13 14   CREATININE 1.01 0.97 1.18 0.99 1.01  CALCIUM 8.6* 8.4* 8.5* 8.9 8.4*     GFR: Estimated Creatinine Clearance: 68.4 mL/min (by C-G formula based on SCr of 1.01 mg/dL).  Liver Function Tests: No results for input(s): "AST", "ALT", "ALKPHOS", "BILITOT", "PROT", "ALBUMIN" in the last 168 hours. No results for input(s): "LIPASE", "AMYLASE" in the last 168 hours. No results for input(s): "AMMONIA" in the last 168 hours.  Coagulation Profile: No results for input(s): "INR", "PROTIME" in the last 168 hours.  Cardiac Enzymes: No results for input(s): "CKTOTAL", "CKMB", "CKMBINDEX", "TROPONINI" in  the last 168 hours.  BNP (last 3 results) No results for input(s): "PROBNP" in the last 8760 hours.  Lipid Profile: No results for input(s): "CHOL", "HDL", "LDLCALC", "TRIG", "CHOLHDL", "LDLDIRECT" in the last 72 hours.  Thyroid Function Tests: No results for input(s): "TSH", "T4TOTAL", "FREET4", "T3FREE", "THYROIDAB" in the last 72 hours.  Anemia Panel: No results for input(s): "VITAMINB12", "FOLATE", "FERRITIN", "TIBC", "IRON", "RETICCTPCT" in the last 72 hours.  Urine analysis:    Component Value Date/Time   COLORURINE STRAW (A) 12/23/2022 2354   APPEARANCEUR CLEAR 12/23/2022 2354   LABSPEC 1.004 (L) 12/23/2022 2354   PHURINE 6.0 12/23/2022 2354   GLUCOSEU NEGATIVE 12/23/2022 2354   HGBUR NEGATIVE 12/23/2022 2354   BILIRUBINUR NEGATIVE 12/23/2022 2354   KETONESUR NEGATIVE 12/23/2022 2354   PROTEINUR NEGATIVE 12/23/2022 2354   NITRITE NEGATIVE 12/23/2022 2354   LEUKOCYTESUR NEGATIVE 12/23/2022 2354    Sepsis Labs: Lactic Acid, Venous    Component Value Date/Time   LATICACIDVEN  1.8 10/18/2022 0248    MICROBIOLOGY: Recent Results (from the past 240 hour(s))  Surgical pcr screen     Status: None   Collection Time: 01/02/23  6:07 PM   Specimen: Nasal Mucosa; Nasal Swab  Result Value Ref Range Status   MRSA, PCR NEGATIVE NEGATIVE Final   Staphylococcus aureus NEGATIVE NEGATIVE Final    Comment: (NOTE) The Xpert SA Assay (FDA approved for NASAL specimens in patients 42 years of age and older), is one component of a comprehensive surveillance program. It is not intended to diagnose infection nor to guide or monitor treatment. Performed at [facility], [address] [place], [place] [address]     RADIOLOGY STUDIES/RESULTS: No results found.   LOS: 11 days   Jeoffrey Massed, MD  [facility]    To contact the attending provider between 7A-7P or the covering provider during after hours 7P-7A, please log into the web site [email or web address] and access using universal  password for that web site. If you do not have the password, please call the hospital operator.  01/05/2023, 11:43 AM

## 2023-01-05 NOTE — Progress Notes (Signed)
Physical Therapy Treatment Patient Details Name: Tristan Gardner MRN: [id number] DOB: [birth year] Today's Date: 01/05/2023   History of Present Illness 88 y.o. male presenting 6/22 with inability to bear weight in L or R LE, frequent falls, R sided facial droop, and intermittent ataxia. Currently s/p lumbar L2-L5 laminectomy decompression (01/03/2023). PMH significant for PMH includes: afib, CAD, HTN, NSTEMI, CVA, sciatica, chronic pain, and obesity.    PT Comments  Progressed patient towards goals per plan of care. Verbal cues provided for rolling in order to maintain spinal precautions during bed mobility. Able to stand with modA and verbal cues for scooting and forward lean. Ambulation limited due to fatigue in LE; presented with bilateral knee flexion throughout prolonged standing balance.  Performed 2 sets of standing marching within RW prior to being seated in recliner. Educated patient on LE exercises in order to improve LE endurance. Will continue to monitor. Based on today's performance and high fall risk, recommending patient would benefit from skilled rehab < 3 hours a day following acute stay.     Assistance Recommended at Discharge Frequent or constant Supervision/Assistance  If plan is discharge home, recommend the following:  Can travel by private vehicle    A little help with bathing/dressing/bathroom;Assistance with cooking/housework;Assist for transportation;Help with stairs or ramp for entrance;A little help with walking and/or transfers   No  Equipment Recommendations  Rolling walker (2 wheels)    Recommendations for Other Services       Precautions / Restrictions Precautions Precautions: Fall;Back Precaution Booklet Issued: Yes (comment) Precaution Comments: Able to recall spinal precautions 2/3; reminded patient of lifting precautions Restrictions Weight Bearing Restrictions: No     Mobility  Bed Mobility Overal bed mobility: Needs Assistance Bed Mobility:  Rolling, Sidelying to Sit Rolling: Min guard Sidelying to sit: Min guard       General bed mobility comments: Patient able to roll without physical assistance; vc provided for knees bent and LUE reach for roll. Able to sit EOB with bed rail support and HOB slightly elevated.    Transfers Overall transfer level: Needs assistance Equipment used: Rolling walker (2 wheels) Transfers: Sit to/from Stand Sit to Stand: Mod assist           General transfer comment: Cued to scoot to EOB for powerup; minA  from PT on initial sit to stand for cleanup. Able to remain upright with RW support.    Ambulation/Gait Ambulation/Gait assistance: Min assist Gait Distance (Feet): 3 Feet Assistive device: Rolling walker (2 wheels) Gait Pattern/deviations: Step-to pattern, Decreased dorsiflexion - left, Decreased dorsiflexion - right, Decreased weight shift to left, Trunk flexed Gait velocity: Decreased Gait velocity interpretation: <1.31 ft/sec, indicative of household ambulator Pre-gait activities: Marching within [employer] 10 reps x2. Pt presented with bilateral knee flexion, min guard assist from pT. General Gait Details: Presented with trunk flexed and step to pattern. Patient reliant on RW support with bilateral knees flexed throughout gait. Limited due to fatigue.   Stairs             Wheelchair Mobility     Tilt Bed    Modified Rankin (Stroke Patients Only)       Balance Overall balance assessment: Needs assistance Sitting-balance support: Feet supported, No upper extremity supported Sitting balance-Leahy Scale: Good Sitting balance - Comments: Able to set edge of recliner and EOB   Standing balance support: Bilateral upper extremity supported, Reliant on assistive device for balance Standing balance-Leahy Scale: Poor Standing balance comment: Heavy reliance on RW for static  standing.                            Cognition Arousal/Alertness: Awake/alert Behavior  During Therapy: WFL for tasks assessed/performed Overall Cognitive Status: Within Functional Limits for tasks assessed                                 General Comments: pleasant throughout        Exercises General Exercises - Lower Extremity Hip Flexion/Marching: AROM, 20 reps, Standing, Strengthening    General Comments        Pertinent Vitals/Pain Pain Assessment Pain Assessment: PAINAD Breathing: normal Negative Vocalization: occasional moan/groan, low speech, negative/disapproving quality Facial Expression: facial grimacing Body Language: relaxed Consolability: no need to console PAINAD Score: 3 Pain Location: Incision and Right hip (Burning) Pain Descriptors / Indicators: Grimacing, Guarding, Operative site guarding, Burning Pain Intervention(s): Limited activity within patient's tolerance, Monitored during session    Home Living Family/patient expects to be discharged to:: Skilled nursing facility Living Arrangements: Children Available Help at Discharge: Family;Available PRN/intermittently Type of Home: House Home Access: Level entry       Home Layout: One level        Prior Function            PT Goals (current goals can now be found in the care plan section) Acute Rehab PT Goals Patient Stated Goal: Improve in walking and strength to return to independent status PT Goal Formulation: With patient Time For Goal Achievement: 01/18/23 Potential to Achieve Goals: Fair Progress towards PT goals: Progressing toward goals    Frequency    Min 4X/week      PT Plan Current plan remains appropriate    Co-evaluation              AM-PAC PT "6 Clicks" Mobility   Outcome Measure  Help needed turning from your back to your side while in a flat bed without using bedrails?: A Little Help needed moving from lying on your back to sitting on the side of a flat bed without using bedrails?: A Little Help needed moving to and from a bed to a  chair (including a wheelchair)?: A Lot Help needed standing up from a chair using your arms (e.g., wheelchair or bedside chair)?: A Lot Help needed to walk in hospital room?: A Lot Help needed climbing 3-5 steps with a railing? : A Lot 6 Click Score: 14    End of Session Equipment Utilized During Treatment: Gait belt Activity Tolerance: Patient limited by fatigue Patient left: in chair;with call bell/phone within reach;with chair alarm set Nurse Communication: Mobility status PT Visit Diagnosis: Unsteadiness on feet (R26.81);Other abnormalities of gait and mobility (R26.89);Muscle weakness (generalized) (M62.81)     Time: 1610-9604 PT Time Calculation (min) (ACUTE ONLY): 25 min  Charges:    $Gait Training: 8-22 mins $Therapeutic Activity: 8-22 mins PT General Charges $$ ACUTE PT VISIT: 1 Visit                     Christene Lye, SPT Acute Rehabilitation Services [phone] Secure chat preferred     Christene Lye 01/05/2023, 11:07 AM

## 2023-01-05 NOTE — Progress Notes (Signed)
Neurosurgery Service Progress Note  Subjective: No acute events overnight, no new complaints  Objective: Vitals:   01/04/23 2200 01/04/23 2300 01/05/23 0300 01/05/23 0815  BP:  (!) 162/93 130/67 (!) 96/44  Pulse: 87  82 61  Resp:  18 15 17   Temp:  99.2 F (37.3 C) (!) 100.5 F (38.1 C) 98.2 F (36.8 C)  TempSrc:  Oral Oral Oral  SpO2: 98% 97% 96% 97%  Weight:      Height:        Physical Exam: Strength 5/5 except 3/5 strength in the left EHL and dorsiflexors, 4/5 in the left hip flexors, SILTx4 except left foot numbness, incision c/d/i  Assessment & Plan: 88 y.o. man with LLE weakness, CT myelogram with severe lumbar stenosis s/p open lumbar laminectomies.  -okay to resume clopidogrel and therapeutic-dose anticoagulation POD7 (7/10) -okay to start prophylactic dose anticoagulation today -will sign off, pt needs to follow up with me in 2-4 weeks for first post-op visit, please call with any concerns or questions  [name]  01/05/23 8:17 AM

## 2023-01-05 NOTE — TOC Progression Note (Addendum)
Transition of Care [facility]) - Progression Note    Patient Details  Name: Tristan Gardner MRN: [id number] Date of Birth: [birth year]  Transition of Care [facility]) CM/SW Contact  Tristan Latin, Tristan Gardner Phone Number: 01/05/2023, 9:59 AM  Clinical Narrative:    9:59am-CSW initiated insurance authorization for [facility], Ref# [id number]. Per [facility], they can accept patient tomorrow pending insurance approval.   4:30pm-Insurance approval received for Tristan Gardner ID# [id number], effective 01/06/2023-01/09/2023.   Expected Discharge Plan: Skilled Nursing Facility Barriers to Discharge: Continued Medical Work up, English as a second language teacher, SNF Pending bed offer  Expected Discharge Plan and Services In-house Referral: Clinical Social Work   Post Acute Care Choice: Skilled Nursing Facility Living arrangements for the past 2 months: Single Family Home                                       Social Determinants of Health (SDOH) Interventions SDOH Screenings   Food Insecurity: No Food Insecurity (01/04/2023)  Housing: Low Risk  (01/04/2023)  Transportation Needs: No Transportation Needs (01/04/2023)  Utilities: Not At Risk (01/04/2023)  Tobacco Use: Medium Risk (01/04/2023)    Readmission Risk Interventions     No data to display

## 2023-01-06 DIAGNOSIS — I10 Essential (primary) hypertension: Secondary | ICD-10-CM | POA: Diagnosis not present

## 2023-01-06 DIAGNOSIS — I482 Chronic atrial fibrillation, unspecified: Secondary | ICD-10-CM | POA: Diagnosis not present

## 2023-01-06 DIAGNOSIS — E785 Hyperlipidemia, unspecified: Secondary | ICD-10-CM | POA: Diagnosis not present

## 2023-01-06 DIAGNOSIS — M48061 Spinal stenosis, lumbar region without neurogenic claudication: Secondary | ICD-10-CM | POA: Diagnosis not present

## 2023-01-06 LAB — BASIC METABOLIC PANEL
Anion gap: 10 (ref 5–15)
BUN: 12 mg/dL (ref 8–23)
CO2: 25 mmol/L (ref 22–32)
Calcium: 8.6 mg/dL — ABNORMAL LOW (ref 8.9–10.3)
Chloride: 94 mmol/L — ABNORMAL LOW (ref 98–111)
Creatinine, Ser: 0.98 mg/dL (ref 0.61–1.24)
GFR, Estimated: >60 mL/min (ref >60)
Glucose, Bld: 113 mg/dL — ABNORMAL HIGH (ref 70–99)
Potassium: 4 mmol/L (ref 3.5–5.1)
Sodium: 129 mmol/L — ABNORMAL LOW (ref 135–145)

## 2023-01-06 MED ORDER — HYDRALAZINE HCL 50 MG PO TABS: 50.0000 mg | ORAL_TABLET | Freq: Three times a day (TID) | ORAL | Status: DC

## 2023-01-06 MED ORDER — BENZOCAINE 10 % MT GEL: Freq: Four times a day (QID) | OROMUCOSAL | 0 refills | Status: DC | PRN

## 2023-01-06 MED ORDER — XARELTO 20 MG PO TABS: 20.0000 mg | ORAL_TABLET | Freq: Every morning | ORAL | Status: AC

## 2023-01-06 MED ORDER — OXYCODONE HCL 5 MG PO TABS: 5.0000 mg | ORAL_TABLET | Freq: Four times a day (QID) | ORAL | 0 refills | Status: DC | PRN

## 2023-01-06 MED ORDER — CLOPIDOGREL BISULFATE 75 MG PO TABS: 75.0000 mg | ORAL_TABLET | Freq: Every day | ORAL | Status: DC

## 2023-01-06 NOTE — TOC Transition Note (Signed)
Transition of Care [facility]) - CM/SW Discharge Note   Patient Details  Name: Tristan Gardner MRN: [id number] Date of Birth: 1935/01/28  Transition of Care [facility]) CM/SW Contact:  Ralene Bathe, LCSW Phone Number: 01/06/2023, 9:27 AM   Clinical Narrative:    Patient will DC to: [facility] Anticipated DC date: 01/06/2023 Family notified:  Yes Transport by: [facility]   Per MD patient ready for DC to SNF. RN to call report prior to discharge [phone] room 610B). RN, patient's family, and facility notified of DC. Discharge Summary sent to facility. DC packet on chart. Ambulance transport requested for patient.   CSW will sign off for now as social work intervention is no longer needed. Please consult [place] again if new needs arise.    Final next level of care: Skilled Nursing Facility Barriers to Discharge: Barriers Resolved   Patient Goals and CMS Choice CMS Medicare.gov Compare Post Acute Care list provided to:: Patient Choice offered to / list presented to : Patient  Discharge Placement                Patient chooses bed at:  [facility]) Patient to be transferred to facility by: [facility] Name of family member notified: Junious Dresser (Daughter) [phone] Patient and family notified of of transfer: 01/06/23  Discharge Plan and Services Additional resources added to the After Visit Summary for   In-house Referral: Clinical Social Work   Post Acute Care Choice: Skilled Nursing Facility                               Social Determinants of Health (SDOH) Interventions SDOH Screenings   Food Insecurity: No Food Insecurity (01/04/2023)  Housing: Low Risk  (01/04/2023)  Transportation Needs: No Transportation Needs (01/04/2023)  Utilities: Not At Risk (01/04/2023)  Tobacco Use: Medium Risk (01/04/2023)     Readmission Risk Interventions     No data to display

## 2023-01-06 NOTE — Discharge Summary (Signed)
PATIENT DETAILS Name: Tristan Gardner Age: 88 y.o. Sex: male Date of Birth: 07/11/1934 MRN: [id number]. Admitting Physician: Frankey Shown, DO WUJ:[id number], [place], PA  Admit Date: 12/23/2022 Discharge date: 01/06/2023  Recommendations for Outpatient Follow-up:  Follow up with PCP in 1-2 weeks Please obtain CMP/CBC in one week Please ensure follow-up with neurosurgery and primary dentist Resume Plavix/Xarelto-tentatively on 7/10 if he continues to recover well from surgery.  Admitted From:  Home  Disposition: Skilled nursing facility   Discharge Condition: good  CODE STATUS:   Code Status: Full Code   Diet recommendation:  Diet Order             Diet - low sodium heart healthy           Diet Heart Room service appropriate? Yes; Fluid consistency: Thin; Fluid restriction: 1500 mL Fluid  Diet effective now                    Brief Summary: Patient is a 88 y.o.  male with history of A-fib on Xarelto, CVA on Plavix-who presented with fall/generalized weakness-upon further evaluation was found to have severe lumbar stenosis on CT myelogram.   Significant events: 6/22>> admit to [facility]   Significant studies: 6/22>> x-ray pelvis: No fracture 6/22 >>x-ray tibia/fibula: No fracture 6/22>> CT head: No acute intracranial abnormality 6/22>> CT C-spine: No fracture 6/23>> CT T/L-spine: Motion degraded study-no fracture/traumatic subluxation. 6/24>> echo: EF 50-55% 6/26>> CT myelography C-spine: No compressive central canal stenosis, degenerative disc disease. 6/26>> CT myelography T-spine: No cord compression 6/26>> CT myelography L-spine: Severe multifactorial spinal stenosis at L3-4 and L4-5.   Significant microbiology data: None   Procedures: 6/26>> CT myelogram 7/03>> L2-L5 laminectomy   Consults: Neurology Neurosurgery  Brief Hospital Course: Worsening back pain-LLE weakness Felt to be secondary to severe canal stenosis at L3-L4, and L4-L5 S/p  decompressive laminectomy on 7/3 Mobilize with PT/OT-SNF recommended Neurosurgery followed closely-please ensure follow-up with Dr. Jetta Lout.   Right-sided dental pain secondary to dental caries Better with Orajel Outpatient follow-up with primary dentist (unfortunately no dental or oral surgery coverage available at [facility])   Hyponatremia Mild Euvolemic Secondary to excessive free water intake-placed on fluid restriction-sodium level slowly improving. Recheck electrolytes in 1 week Continue to encourage fluid restriction-1200-1500 cc/daily.   Peripheral neuropathy Neurontin   Chronic atrial fibrillation Metoprolol for rate control Xarelto on hold-discussed with neurosurgery-Dr. Loni Dolly in 1 week from 7/3-tentatively on 01/10/2023   History of CVA Plavix on hold-discussed with neurosurgery-Dr. Loni Dolly 1 week from 7/3-tentatively on 01/10/2023   HTN Stable Imdur/losartan/Toprol/hydralazine   HLD Lipitor   GERD Had epigastric gastric pain 7/4 Resolved with supportive care-now on PPI   Prediabetes (A1c 6.1 on 6/24)  follow CBGs/fasting glucose   Bladder wall thickening Incidental finding on imaging studies No clinical symptomatology consistent with UTI   7 mm hypodense lesion in the inferior aspect of right hepatic lobe Incidental finding on imaging studies Stable for outpatient follow-up with PCP LFT stable   Obesity: Estimated body mass index is 34.87 kg/m as calculated from the following:   Height as of this encounter: 5\' 11"  (1.803 m).   Weight as of this encounter: 113.4 kg.    Discharge Diagnoses:  Principal Problem:   Lumbar spinal stenosis Active Problems:   Chronic atrial fibrillation with RVR (HCC)   Atrial flutter (HCC)   Neurological deficit, transient   Essential hypertension   History of CVA (cerebrovascular accident)   Peripheral neuropathy   Pain, dental  Hepatic lesion   Dyslipidemia   Prediabetes    Class 1 obesity due to excess calories with body mass index (BMI) of 34.0 to 34.9 in adult   Discharge Instructions:  Activity:  As tolerated with Full fall precautions use walker/cane & assistance as needed  Discharge Instructions     Call MD for:  persistant nausea and vomiting   Complete by: As directed    Call MD for:  severe uncontrolled pain   Complete by: As directed    Diet - low sodium heart healthy   Complete by: As directed    1200-1500 cc fluid restriction.   Discharge instructions   Complete by: As directed    Follow with Primary MD  Porfirio Oar, PA in 1-2 weeks  Please follow-up with neurosurgeon-Dr. Johnsie Cancel in the next 1-2 weeks.  Resume Plavix/Xarelto on 7/10-if no longer have any major complications from your recent surgery  Please get a complete blood count and chemistry panel checked by your Primary MD at your next visit, and again as instructed by your Primary MD.  Get Medicines reviewed and adjusted: Please take all your medications with you for your next visit with your Primary MD  Laboratory/radiological data: Please request your Primary MD to go over all hospital tests and procedure/radiological results at the follow up, please ask your Primary MD to get all Hospital records sent to his/her office.  In some cases, they will be blood work, cultures and biopsy results pending at the time of your discharge. Please request that your primary care M.D. follows up on these results.  Also Note the following: If you experience worsening of your admission symptoms, develop shortness of breath, life threatening emergency, suicidal or homicidal thoughts you must seek medical attention immediately by calling 911 or calling your MD immediately  if symptoms less severe.  You must read complete instructions/literature along with all the possible adverse reactions/side effects for all the Medicines you take and that have been prescribed to you. Take any new  Medicines after you have completely understood and accpet all the possible adverse reactions/side effects.   Do not drive when taking Pain medications or sleeping medications (Benzodaizepines)  Do not take more than prescribed Pain, Sleep and Anxiety Medications. It is not advisable to combine anxiety,sleep and pain medications without talking with your primary care practitioner  Special Instructions: If you have smoked or chewed Tobacco  in the last 2 yrs please stop smoking, stop any regular Alcohol  and or any Recreational drug use.  Wear Seat belts while driving.  Please note: You were cared for by a hospitalist during your hospital stay. Once you are discharged, your primary care physician will handle any further medical issues. Please note that NO REFILLS for any discharge medications will be authorized once you are discharged, as it is imperative that you return to your primary care physician (or establish a relationship with a primary care physician if you do not have one) for your post hospital discharge needs so that they can reassess your need for medications and monitor your lab values.   Increase activity slowly   Complete by: As directed       Allergies as of 01/06/2023       Reactions   Iodine Hives, Itching, Rash      Shellfish Allergy Hives        Medication List     TAKE these medications    acetaminophen 325 MG tablet Commonly known as: TYLENOL Take 2  tablets (650 mg total) by mouth every 6 (six) hours as needed for mild pain (or Fever >/= 101).   albuterol 108 (90 Base) MCG/ACT inhaler Commonly known as: VENTOLIN HFA Inhale 2 puffs into the lungs every 4 (four) hours as needed for wheezing or shortness of breath.   atorvastatin 40 MG tablet Commonly known as: LIPITOR Take 40 mg by mouth at bedtime.   baclofen 10 MG tablet Commonly known as: LIORESAL Take 10 mg by mouth 3 (three) times daily.   benzocaine 10 % mucosal gel Commonly known as:  ORAJEL Use as directed in the mouth or throat 4 (four) times daily as needed for mouth pain.   clopidogrel 75 MG tablet Commonly known as: PLAVIX Take 1 tablet (75 mg total) by mouth daily. START ON 7/10 Start taking on: January 10, 2023 What changed:  additional instructions These instructions start on January 10, 2023. If you are unsure what to do until then, ask your doctor or other care provider.   DULoxetine 30 MG capsule Commonly known as: CYMBALTA Take 30 mg by mouth 2 (two) times daily.   gabapentin 300 MG capsule Commonly known as: NEURONTIN Take 300 mg by mouth 3 (three) times daily.   hydrALAZINE 50 MG tablet Commonly known as: APRESOLINE Take 1 tablet (50 mg total) by mouth every 8 (eight) hours.   IRON PO Take 1 tablet by mouth daily.   isosorbide mononitrate 30 MG 24 hr tablet Commonly known as: IMDUR Take 30 mg by mouth daily.   losartan 25 MG tablet Commonly known as: COZAAR Take 1 tablet (25 mg total) by mouth daily.   metoprolol succinate 50 MG 24 hr tablet Commonly known as: TOPROL-XL Take 50 mg by mouth daily.   oxyCODONE 5 MG immediate release tablet Commonly known as: Oxy IR/ROXICODONE Take 1 tablet (5 mg total) by mouth every 6 (six) hours as needed for moderate pain or severe pain.   pantoprazole 40 MG tablet Commonly known as: Protonix Take 1 tablet (40 mg total) by mouth 2 (two) times daily before a meal.   traZODone 50 MG tablet Commonly known as: DESYREL Take 50 mg by mouth at bedtime.   Xarelto 20 MG Tabs tablet Generic drug: rivaroxaban Take 1 tablet (20 mg total) by mouth every morning. START ON 7/10 Start taking on: January 10, 2023 What changed:  additional instructions These instructions start on January 10, 2023. If you are unsure what to do until then, ask your doctor or other care provider.        Contact information for follow-up providers     Porfirio Oar, [place]. Schedule an appointment as soon as possible for a visit in 1  week(s).   Specialty: Family Medicine Contact information: [address] [place] [address] [phone]         [name], MD. Schedule an appointment as soon as possible for a visit in 1 week(s).   Specialty: Neurosurgery Contact information: [address] [place] [address] [place] [address] [phone]              Contact information for after-discharge care     Destination     [facility] .   Service: Skilled Nursing Contact information: [address] [facility] [address] Address [place] [address] [phone]                    Allergies  Allergen Reactions   Iodine Hives, Itching and  Rash         Shellfish Allergy Hives     Other Procedures/Studies: DG Lumbar Spine 1 View  Result Date: 01/03/2023 CLINICAL DATA:  Elective surgery. EXAM: LUMBAR SPINE - 1 VIEW COMPARISON:  CT 12/27/2022 FINDINGS: Tissue spreaders in place posteriorly with a probe directed at the L3-4 disc level. IMPRESSION: Surgical probe directed at the L3-4 disc level. Electronically Signed   By: Paulina Fusi M.D.   On: 01/03/2023 12:44   DG C-Arm 1-60 Min-No Report  Result Date: 01/03/2023 Fluoroscopy was utilized by the requesting physician.  No radiographic interpretation.   IR INJECT DIAG/THERA/INC NEEDLE/CATH/PLC EPI/LUMB/SAC W/IMG  Result Date: 12/27/2022 CLINICAL DATA:  Acute myelopathy EXAM: CERVICAL AND LUMBAR MYELOGRAM FLUOROSCOPY: Radiation Exposure Index (as provided by the fluoroscopic device): 8 mGy air Kerma TECHNIQUE: After patient anesthetized by department of anesthesia and placed prone on the procedure table, an appropriate entry site was determined under fluoroscopy. Operator donned sterile gloves and mask. Skin site was marked, prepped with Betadine, and draped in usual sterile fashion, and infiltrated locally with 1% lidocaine. A 20 gauge spinal needle was advanced into the thecal sac at L2 from a  left parasagittal approach. Clear colorless CSF returned. 10 mL Omnipaque 300 were administered intrathecally for myelography. Representative postcontrast lumbar and cervical images were obtained. COMPARISON:  None Available. FINDINGS: Lumbar imaging confirms intrathecal administration of the contrast. Cervical imaging demonstrates contrast up to the midthoracic cervical spine. See subsequent CT dictated separately for detailed evaluation of cervical, thoracic, and lumbar regions. IMPRESSION: Technically successful  myelogram. Electronically Signed   By: Corlis Leak M.D.   On: 12/27/2022 14:37   CT CERVICAL SPINE W CONTRAST  Result Date: 12/27/2022 CLINICAL DATA:  Myelopathy, acute common cervical spine EXAM: CT MYELOGRAPHY CERVICAL SPINE TECHNIQUE: CT imaging of the cervical spine was performed after Isovue 300M contrast administration. Multiplanar CT image reconstructions were also generated. RADIATION DOSE REDUCTION: This exam was performed according to the departmental dose-optimization program which includes automated exposure control, adjustment of the mA and/or kV according to patient size and/or use of iterative reconstruction technique. COMPARISON:  CT 12/23/2022 FINDINGS: Injection procedure dictated separately. Alignment: Straightening of the normal cervical lordosis. Vertebrae: No fracture or focal bone lesion. Some motion degradation in the mid cervical region. Cord: See description of each level below. Posterior Fossa and paraspinal tissues: Negative Disc levels: Foramen magnum is widely patent. C1-2 shows ordinary osteoarthritis but no encroachment upon the neural structures. C2-3: Facet osteoarthritis on the left. Uncovertebral hypertrophy left more than right. No central canal stenosis. Moderate bony foraminal narrowing on the left. C3-4: Endplate and uncovertebral osteophytes. Facet arthropathy right worse than left. Narrowing of the subarachnoid space of the central canal but no gross cord  compression. Severe bilateral bony foraminal stenosis. C4-5: Endplate osteophytes but no compressive central canal stenosis. Facet osteoarthritis right worse than left. Bilateral bony foraminal narrowing, mild on the left and severe on the right. C5-6: Motion degraded. Endplate osteophytes efface the ventral subarachnoid space. There appears to be subarachnoid space present dorsal to the cord and I would doubt significant cervical cord compression. There is moderate bilateral bony foraminal stenosis. C6-7: This level also suffers from motion degradation. There appears to be chronic fusion of the vertebral bodies. Sufficient patency of the canal and foramina. C7-T1: Significant motion degradation. Endplate osteophytes and uncovertebral osteophytes. Bilateral facet degeneration. No likely compressive central canal stenosis. Moderate bilateral foraminal narrowing. IMPRESSION: 1. Motion degraded study. 2. Advanced chronic degenerative changes throughout the  cervical region as outlined above. No likely compressive central canal stenosis. There is some narrowing of the subarachnoid space of the central canal at C3-4 and C5-6, but no gross cord compression. 3. Bony foraminal stenosis that could be significant on the left at C2-3, bilaterally at C3-4, on the right at C4-5 and bilaterally at C5-6 and C7-T1. 4. Chronic fusion of the C6 and C7 vertebral bodies. Sufficient patency of the canal and foramina. Electronically Signed   By: Paulina Fusi M.D.   On: 12/27/2022 13:02   CT LUMBAR SPINE W CONTRAST  Result Date: 12/27/2022 CLINICAL DATA:  Acute myelopathy. EXAM: CT MYELOGRAPHY LUMBAR SPINE TECHNIQUE: CT imaging of the lumbar spine was performed after Isovue 200M contrast administration. Multiplanar CT image reconstructions were also generated. RADIATION DOSE REDUCTION: This exam was performed according to the departmental dose-optimization program which includes automated exposure control, adjustment of the mA and/or  kV according to patient size and/or use of iterative reconstruction technique. COMPARISON:  12/24/2022 FINDINGS: Contrast injection described separately. Segmentation:  5 lumbar type vertebral bodies assumed. Alignment: No significant malalignment. Vertebrae: No fracture or focal bone lesion. Conus medullaris: Extends to the L1-2 level and appears normal. Paraspinal and other soft tissues: Aortic atherosclerosis. Disc levels: L1-2: Bulging of the disc. Facet and ligamentous hypertrophy. Mild canal stenosis but no likely central compression. Foraminal narrowing left more than right could affect the exiting L1 nerves. L2-3: Endplate osteophytes and bulging of the disc. Facet and ligamentous hypertrophy. Moderate multifactorial spinal stenosis. Bilateral foraminal stenosis. L3-4: Endplate osteophytes and shallow protrusion of the disc. Facet and ligamentous hypertrophy. Severe multifactorial canal and foraminal stenosis. L4-5: Endplate osteophytes and protrusion of the disc. Pronounced facet and ligamentous hypertrophy. Severe multifactorial spinal stenosis and foraminal stenosis. L5-S1: Endplate osteophytes. Facet and ligamentous hypertrophy. Narrowing of the subarticular lateral recesses left more than right. Bilateral foraminal stenosis that could affect the exiting L5 nerves, worse on the left. IMPRESSION: 1. Advanced chronic degenerative changes throughout the lumbar region as outlined above. Severe multifactorial spinal stenosis at L3-4 and L4-5. Moderate multifactorial spinal stenosis at L2-3. Lateral recess stenosis at L5-S1 left more than right. Foraminal stenosis at L1-2, L2-3, L3-4 and L4-5 and at L5-S1. Aortic Atherosclerosis (ICD10-I70.0). Electronically Signed   By: Paulina Fusi M.D.   On: 12/27/2022 12:03   CT THORACIC SPINE W CONTRAST  Result Date: 12/27/2022 CLINICAL DATA:  Myelopathy EXAM: CT MYELOGRAPHY THORACIC SPINE TECHNIQUE: CT imaging of the thoracic spine was performed after intrathecal  contrast administration. Multiplanar CT image reconstructions were also generated. RADIATION DOSE REDUCTION: This exam was performed according to the departmental dose-optimization program which includes automated exposure control, adjustment of the mA and/or kV according to patient size and/or use of iterative reconstruction technique. COMPARISON:  CT 3 days ago FINDINGS: Contrast injection described separately. Alignment:  Normal Vertebrae: No fracture or focal bone lesion. Cord: No spinal cord compression or visible cord lesion by myelography. Normal cord diameter. Paraspinal and other soft tissues:  Dependent pulmonary atelectasis. Disc levels: No significant disc level pathology. No disc herniation or compressive stenosis of the canal or foramina. Ordinary facet osteoarthritis, typical for age, arguably less advanced than often seen. IMPRESSION: 1. No spinal cord compression or visible cord lesion by myelography. 2. Ordinary facet osteoarthritis, typical for age, arguably less advanced than often seen. No disc herniation or compressive stenosis of the canal or foramina. Electronically Signed   By: Paulina Fusi M.D.   On: 12/27/2022 11:58   ECHOCARDIOGRAM COMPLETE  Result  Date: 12/25/2022    ECHOCARDIOGRAM REPORT   Patient Name:   JACOBUS PRENGER Date of Exam: 12/25/2022 Medical Rec #:  [id number]     Height:       71.0 in Accession #:    [id number]    Weight:       245.0 lb Date of Birth:  03-22-1935    BSA:          2.298 m Patient Age:    87 years      BP:           170/89 mmHg Patient Gender: M             HR:           58 bpm. Exam Location:  Inpatient Procedure: 2D Echo, Cardiac Doppler and Color Doppler Indications:    Syncope R55  History:        Patient has prior history of Echocardiogram examinations, most                 recent 03/12/2020. Stroke, Arrythmias:Atrial Fibrillation and                 Atrial Flutter, Signs/Symptoms:Syncope and Chest Pain; Risk                 Factors:Hypertension.   Sonographer:    Lucendia Herrlich Referring Phys: [id number] OLADAPO ADEFESO IMPRESSIONS  1. Left ventricular ejection fraction, by estimation, is 50 to 55%. The left ventricle has low normal function. The left ventricle has no regional wall motion abnormalities. There is moderate concentric left ventricular hypertrophy. Left ventricular diastolic function could not be evaluated.  2. Right ventricular systolic function was not well visualized. The right ventricular size is not well visualized. There is normal pulmonary artery systolic pressure.  3. Left atrial size was mild to moderately dilated.  4. The mitral valve is normal in structure. Mild mitral valve regurgitation. No evidence of mitral stenosis.  5. The aortic valve is tricuspid. There is mild calcification of the aortic valve. There is mild thickening of the aortic valve. Aortic valve regurgitation is not visualized.  6. Aortic dilatation noted. There is mild dilatation of the ascending aorta, measuring 40 mm.  7. The inferior vena cava is normal in size with greater than 50% respiratory variability, suggesting right atrial pressure of 3 mmHg. Comparison(s): No significant change from prior study. FINDINGS  Left Ventricle: Left ventricular ejection fraction, by estimation, is 50 to 55%. The left ventricle has low normal function. The left ventricle has no regional wall motion abnormalities. The left ventricular internal cavity size was normal in size. There is moderate concentric left ventricular hypertrophy. Left ventricular diastolic function could not be evaluated due to atrial fibrillation. Left ventricular diastolic function could not be evaluated. Right Ventricle: The right ventricular size is not well visualized. Right vetricular wall thickness was not well visualized. Right ventricular systolic function was not well visualized. There is normal pulmonary artery systolic pressure. The tricuspid regurgitant velocity is 2.82 m/s, and with an assumed right  atrial pressure of 3 mmHg, the estimated right ventricular systolic pressure is 34.8 mmHg. Left Atrium: Left atrial size was mild to moderately dilated. Right Atrium: Right atrial size was not well visualized. Pericardium: There is no evidence of pericardial effusion. Mitral Valve: The mitral valve is normal in structure. Mild mitral valve regurgitation. No evidence of mitral valve stenosis. Tricuspid Valve: The tricuspid valve is normal in structure. Tricuspid valve regurgitation is mild . No evidence  of tricuspid stenosis. Aortic Valve: The aortic valve is tricuspid. There is mild calcification of the aortic valve. There is mild thickening of the aortic valve. Aortic valve regurgitation is not visualized. Aortic valve peak gradient measures 6.6 mmHg. Pulmonic Valve: The pulmonic valve was not well visualized. Pulmonic valve regurgitation is mild. No evidence of pulmonic stenosis. Aorta: Aortic dilatation noted. There is mild dilatation of the ascending aorta, measuring 40 mm. Venous: The inferior vena cava is normal in size with greater than 50% respiratory variability, suggesting right atrial pressure of 3 mmHg. IAS/Shunts: The interatrial septum was not well visualized.  LEFT VENTRICLE PLAX 2D LVIDd:         4.00 cm   Diastology LVIDs:         2.90 cm   LV e' medial:    7.53 cm/s LV PW:         1.50 cm   LV E/e' medial:  10.5 LV IVS:        1.80 cm   LV e' lateral:   9.55 cm/s LVOT diam:     2.20 cm   LV E/e' lateral: 8.3 LV SV:         56 LV SV Index:   24 LVOT Area:     3.80 cm  RIGHT VENTRICLE             IVC RV S prime:     10.90 cm/s  IVC diam: 1.70 cm TAPSE (M-mode): 1.6 cm LEFT ATRIUM             Index LA diam:        4.90 cm 2.13 cm/m LA Vol (A2C):   69.2 ml 30.11 ml/m LA Vol (A4C):   73.9 ml 32.15 ml/m LA Biplane Vol: 73.4 ml 31.93 ml/m  AORTIC VALVE                 PULMONIC VALVE AV Area (Vmax): 2.14 cm     PR End Diast Vel: 5.29 msec AV Vmax:        128.00 cm/s AV Peak Grad:   6.6 mmHg LVOT  Vmax:      71.92 cm/s LVOT Vmean:     44.750 cm/s LVOT VTI:       0.146 m  AORTA Ao Root diam: 3.70 cm Ao Asc diam:  4.00 cm MITRAL VALVE               TRICUSPID VALVE MV Area (PHT): 5.02 cm    TR Peak grad:   31.8 mmHg MV Decel Time: 151 msec    TR Vmax:        282.00 cm/s MR Peak grad: 109.0 mmHg MR Vmax:      522.00 cm/s  SHUNTS MV E velocity: 79.30 cm/s  Systemic VTI:  0.15 m MV A velocity: 54.00 cm/s  Systemic Diam: 2.20 cm MV E/A ratio:  1.47 Jodelle Red MD Electronically signed by Jodelle Red MD Signature Date/Time: 12/25/2022/4:49:14 PM    Final    EEG adult  Result Date: 12/24/2022 Charlsie Quest, MD     12/25/2022  8:52 AM Patient Name: Tristan Gardner MRN: [id number] Epilepsy Attending: Charlsie Quest Referring Physician/Provider: Marjorie Smolder, NP Date: 12/24/2022 Duration: 22.28 mins Patient history: 88 year old patient with history of A-fib on Xarelto, CAD, sciatica, MI and stroke presents after 2 falls with left lower extremity weakness, spasms of left greater than right lower extremity and a transient episode of garbled speech and disorientation.  Patient is unable to obtain MRI due to retained metal in head. EEG to evaluate for seizure. Level of alertness: Awake AEDs during EEG study: GBP Technical aspects: This EEG study was done with scalp electrodes positioned according to the 10-20 International system of electrode placement. Electrical activity was reviewed with band pass filter of 1-70Hz , sensitivity of 7 uV/mm, display speed of 76mm/sec with a 60Hz  notched filter applied as appropriate. EEG data were recorded continuously and digitally stored.  Video monitoring was available and reviewed as appropriate. Description: The posterior dominant rhythm consists of 9 Hz activity of moderate voltage (25-35 uV) seen predominantly in posterior head regions, symmetric and reactive to eye opening and eye closing. Physiologic photic driving was not seen during photic  stimulation.  Hyperventilation was not performed.   During photic stimulation patient was noted to have subtle twitching in bilateral lower extremities. Concomitant eeg before, during and after the event didn't show any eeg change to suggest seizure. IMPRESSION: This study is within normal limits. No seizures or epileptiform discharges were seen throughout the recording. During photic stimulation patient was noted to have subtle twitching in bilateral lower extremities without concomitant eeg change, This was not an epileptic event. A normal interictal EEG does not exclude the diagnosis of epilepsy. Priyanka Annabelle Harman   CT LUMBAR SPINE WO CONTRAST  Result Date: 12/24/2022 CLINICAL DATA:  Spinal cord injury, follow up EXAM: CT  THORACIC AND LUMBAR SPINE WITHOUT CONTRAST TECHNIQUE: Multidetector CT imaging of the thoracic and lumbar spine was performed without intravenous contrast. Multiplanar CT image reconstructions were also generated. RADIATION DOSE REDUCTION: This exam was performed according to the departmental dose-optimization program which includes automated exposure control, adjustment of the mA and/or kV according to patient size and/or use of iterative reconstruction technique. COMPARISON:  Lumbar spine radiograph 12/23/22 FINDINGS: CT THORACIC SPINE FINDINGS Alignment: Normal. Vertebrae: No acute fracture or focal pathologic process. Paraspinal and other soft tissues: Trace right pleural effusion. Moderate coronary artery calcifications. Hypodense lesion of the upper pole of the right kidney favored to represent a simple renal cyst. Aortic atherosclerotic calcifications. 7 mm hypodense lesion of the inferior aspect of the right hepatic lobe is assessed due to the absence of IV contrast too small to accurately characterize. Disc levels: Evidence of high-grade spinal canal stenosis. CT LUMBAR SPINE FINDINGS Limitations: Assessment lumbar spine is slightly limited due to motion artifact, particularly in the  lower lumbar spine within this limitation Segmentation:  5 lumbar type vertebral bodies. Alignment: Normal. Vertebrae: Within the limitations of a motion degraded exam, no evidence of an acute fracture. Vertebral body heights are preserved. Paraspinal and other soft tissues: Incidentally noted circumferential bladder wall thickening, which is nonspecific, but can be seen in the setting of cystitis or chronic bladder outlet obstruction. Correlate with urinalysis. Prostate is enlarged. Disc levels: There may be moderate spinal canal narrowing at L2-L3 and moderate to severe spinal canal narrowing at L4-L5. IMPRESSION: 1. Within the limitations of a motion degraded exam, no evidence of acute fracture or traumatic subluxation of the thoracic or lumbar spine. 2. Incidentally noted circumferential bladder wall thickening, which is nonspecific, but can be seen in the setting of cystitis or chronic bladder outlet obstruction. Correlate with urinalysis. 3. Trace right pleural effusion. 4. Aortic atherosclerosis. 5. 7 mm hypodense lesion of the inferior aspect of the right hepatic lobe is assessed due to the absence of IV contrast too small to accurately characterize. Recommend further evaluation with a liver ultrasound. Aortic Atherosclerosis (ICD10-I70.0). Electronically  Signed   By: Lorenza Cambridge M.D.   On: 12/24/2022 11:45   CT THORACIC SPINE WO CONTRAST  Result Date: 12/24/2022 CLINICAL DATA:  Spinal cord injury, follow up EXAM: CT  THORACIC AND LUMBAR SPINE WITHOUT CONTRAST TECHNIQUE: Multidetector CT imaging of the thoracic and lumbar spine was performed without intravenous contrast. Multiplanar CT image reconstructions were also generated. RADIATION DOSE REDUCTION: This exam was performed according to the departmental dose-optimization program which includes automated exposure control, adjustment of the mA and/or kV according to patient size and/or use of iterative reconstruction technique. COMPARISON:  Lumbar  spine radiograph 12/23/22 FINDINGS: CT THORACIC SPINE FINDINGS Alignment: Normal. Vertebrae: No acute fracture or focal pathologic process. Paraspinal and other soft tissues: Trace right pleural effusion. Moderate coronary artery calcifications. Hypodense lesion of the upper pole of the right kidney favored to represent a simple renal cyst. Aortic atherosclerotic calcifications. 7 mm hypodense lesion of the inferior aspect of the right hepatic lobe is assessed due to the absence of IV contrast too small to accurately characterize. Disc levels: Evidence of high-grade spinal canal stenosis. CT LUMBAR SPINE FINDINGS Limitations: Assessment lumbar spine is slightly limited due to motion artifact, particularly in the lower lumbar spine within this limitation Segmentation:  5 lumbar type vertebral bodies. Alignment: Normal. Vertebrae: Within the limitations of a motion degraded exam, no evidence of an acute fracture. Vertebral body heights are preserved. Paraspinal and other soft tissues: Incidentally noted circumferential bladder wall thickening, which is nonspecific, but can be seen in the setting of cystitis or chronic bladder outlet obstruction. Correlate with urinalysis. Prostate is enlarged. Disc levels: There may be moderate spinal canal narrowing at L2-L3 and moderate to severe spinal canal narrowing at L4-L5. IMPRESSION: 1. Within the limitations of a motion degraded exam, no evidence of acute fracture or traumatic subluxation of the thoracic or lumbar spine. 2. Incidentally noted circumferential bladder wall thickening, which is nonspecific, but can be seen in the setting of cystitis or chronic bladder outlet obstruction. Correlate with urinalysis. 3. Trace right pleural effusion. 4. Aortic atherosclerosis. 5. 7 mm hypodense lesion of the inferior aspect of the right hepatic lobe is assessed due to the absence of IV contrast too small to accurately characterize. Recommend further evaluation with a liver  ultrasound. Aortic Atherosclerosis (ICD10-I70.0). Electronically Signed   By: Lorenza Cambridge M.D.   On: 12/24/2022 11:45   CT HEAD WO CONTRAST  Result Date: 12/23/2022 CLINICAL DATA:  Neuro deficit, acute, stroke suspected; Neck trauma (Age >= 65y) EXAM: CT HEAD WITHOUT CONTRAST CT CERVICAL SPINE WITHOUT CONTRAST TECHNIQUE: Multidetector CT imaging of the head and cervical spine was performed following the standard protocol without intravenous contrast. Multiplanar CT image reconstructions of the cervical spine were also generated. RADIATION DOSE REDUCTION: This exam was performed according to the departmental dose-optimization program which includes automated exposure control, adjustment of the mA and/or kV according to patient size and/or use of iterative reconstruction technique. COMPARISON:  CT head 10/21/2022 FINDINGS: CT HEAD FINDINGS Brain: Cerebral ventricle sizes are concordant with the degree of cerebral volume loss. Patchy and confluent areas of decreased attenuation are noted throughout the deep and periventricular white matter of the cerebral hemispheres bilaterally, compatible with chronic microvascular ischemic disease. No evidence of large-territorial acute infarction. No parenchymal hemorrhage. No mass lesion. No extra-axial collection. No mass effect or midline shift. No hydrocephalus. Basilar cisterns are patent. Vascular: No hyperdense vessel. Skull: No acute fracture or focal lesion. Sinuses/Orbits: Paranasal sinuses and mastoid air cells are clear. Redemonstration  of right orbit surgical changes. Otherwise the orbits are unremarkable. Other: Similar retained metallic densities along the scalp. CT CERVICAL SPINE FINDINGS Alignment: Normal. Skull base and vertebrae: Markedly limited evaluation due to motion artifact. Multilevel degenerative changes. Question osseous neural foraminal stenosis. No severe osseous central canal stenosis. Soft tissues and spinal canal: No prevertebral fluid or  swelling. No visible canal hematoma. Upper chest: Unremarkable. Other: None. IMPRESSION: 1. No acute intracranial abnormality. 2. Markedly limited evaluation of the spine for fracture due to motion artifact. Recommend repeat Electronically Signed   By: Tish Frederickson M.D.   On: 12/23/2022 21:40   CT Cervical Spine Wo Contrast  Result Date: 12/23/2022 CLINICAL DATA:  Neuro deficit, acute, stroke suspected; Neck trauma (Age >= 65y) EXAM: CT HEAD WITHOUT CONTRAST CT CERVICAL SPINE WITHOUT CONTRAST TECHNIQUE: Multidetector CT imaging of the head and cervical spine was performed following the standard protocol without intravenous contrast. Multiplanar CT image reconstructions of the cervical spine were also generated. RADIATION DOSE REDUCTION: This exam was performed according to the departmental dose-optimization program which includes automated exposure control, adjustment of the mA and/or kV according to patient size and/or use of iterative reconstruction technique. COMPARISON:  CT head 10/21/2022 FINDINGS: CT HEAD FINDINGS Brain: Cerebral ventricle sizes are concordant with the degree of cerebral volume loss. Patchy and confluent areas of decreased attenuation are noted throughout the deep and periventricular white matter of the cerebral hemispheres bilaterally, compatible with chronic microvascular ischemic disease. No evidence of large-territorial acute infarction. No parenchymal hemorrhage. No mass lesion. No extra-axial collection. No mass effect or midline shift. No hydrocephalus. Basilar cisterns are patent. Vascular: No hyperdense vessel. Skull: No acute fracture or focal lesion. Sinuses/Orbits: Paranasal sinuses and mastoid air cells are clear. Redemonstration of right orbit surgical changes. Otherwise the orbits are unremarkable. Other: Similar retained metallic densities along the scalp. CT CERVICAL SPINE FINDINGS Alignment: Normal. Skull base and vertebrae: Markedly limited evaluation due to motion  artifact. Multilevel degenerative changes. Question osseous neural foraminal stenosis. No severe osseous central canal stenosis. Soft tissues and spinal canal: No prevertebral fluid or swelling. No visible canal hematoma. Upper chest: Unremarkable. Other: None. IMPRESSION: 1. No acute intracranial abnormality. 2. Markedly limited evaluation of the spine for fracture due to motion artifact. Recommend repeat Electronically Signed   By: Tish Frederickson M.D.   On: 12/23/2022 21:40   DG Chest 1 View  Result Date: 12/23/2022 CLINICAL DATA:  Weakness, fell EXAM: CHEST  1 VIEW COMPARISON:  10/18/2022 FINDINGS: Single frontal view of the chest demonstrates a stable enlarged cardiac silhouette. No acute airspace disease, effusion, or pneumothorax. No acute displaced fracture. IMPRESSION: 1. Stable chest, no acute process. Electronically Signed   By: Sharlet Salina M.D.   On: 12/23/2022 20:41   DG Lumbar Spine Complete  Result Date: 12/23/2022 CLINICAL DATA:  Weakness, fell EXAM: LUMBAR SPINE - COMPLETE 4+ VIEW COMPARISON:  None Available. FINDINGS: Frontal, bilateral oblique, lateral views of the lumbar spine are obtained. There are 5 non-rib-bearing lumbar type vertebral bodies in grossly normal anatomic alignment. No acute displaced fracture. Multilevel lumbar spondylosis and facet hypertrophy greatest at L4-5 and L5-S1. Sacroiliac joints are normal. IMPRESSION: 1. Lower lumbar spondylosis and facet hypertrophy. No acute fracture. Electronically Signed   By: Sharlet Salina M.D.   On: 12/23/2022 20:40   DG Thoracic Spine 2 View  Result Date: 12/23/2022 CLINICAL DATA:  Weakness, fell EXAM: THORACIC SPINE 2 VIEWS COMPARISON:  None Available. FINDINGS: Frontal and lateral views of the thoracic spine  are obtained. Alignment is anatomic. No acute fractures. Mild lower thoracic spondylosis. Paraspinal soft tissues are unremarkable. IMPRESSION: 1. No acute bony abnormality. Electronically Signed   By: Sharlet Salina  M.D.   On: 12/23/2022 20:40   DG Tibia/Fibula Left  Result Date: 12/23/2022 CLINICAL DATA:  Weakness, fell, inability to bear weight EXAM: LEFT TIBIA AND FIBULA - 2 VIEW COMPARISON:  None Available. FINDINGS: Frontal and lateral views of the left tibia and fibula are obtained. Evidence of prior healed distal left tibial diaphyseal fracture. No acute fracture, subluxation, or dislocation. Moderate 3 compartmental left knee osteoarthritis. Left ankle is unremarkable. Soft tissues are normal. IMPRESSION: 1. No acute displaced fracture. 2. Moderate left knee osteoarthritis. Electronically Signed   By: Sharlet Salina M.D.   On: 12/23/2022 20:39   DG Pelvis 1-2 Views  Result Date: 12/23/2022 CLINICAL DATA:  Weakness, fell, inability to bear weight on left side EXAM: PELVIS - 1-2 VIEW COMPARISON:  None Available. FINDINGS: Supine frontal view of the pelvis was obtained. No evidence of fracture, subluxation, or dislocation within either hip. Mild symmetrical bilateral hip osteoarthritis. The remainder of the bony pelvis is unremarkable. Sacroiliac joints are normal. IMPRESSION: 1. No acute displaced fracture. 2. Symmetrical bilateral hip osteoarthritis. Electronically Signed   By: Sharlet Salina M.D.   On: 12/23/2022 20:38     TODAY-DAY OF DISCHARGE:  Subjective:   Tristan Gardner today has no headache,no chest abdominal pain,no new weakness tingling or numbness, feels much better wants to go home today.   Objective:   Blood pressure 138/85, pulse 81, temperature 99.1 F (37.3 C), temperature source Oral, resp. rate 18, height 5\' 11"  (1.803 m), weight 113.4 kg, SpO2 98 %.  Intake/Output Summary (Last 24 hours) at 01/06/2023 0826 Last data filed at 01/06/2023 0600 Gross per 24 hour  Intake --  Output 2750 ml  Net -2750 ml   Filed Weights   12/23/22 2233 12/27/22 0811 01/03/23 0756  Weight: 111.1 kg 111.1 kg 113.4 kg    Exam: Awake Alert, Oriented *3, No new F.N deficits, Normal  affect [place].AT,PERRAL Supple Neck,No JVD, No cervical lymphadenopathy appriciated.  Symmetrical Chest wall movement, Good air movement bilaterally, CTAB RRR,No Gallops,Rubs or new Murmurs, No Parasternal Heave +ve B.Sounds, Abd Soft, Non tender, No organomegaly appriciated, No rebound -guarding or rigidity. No Cyanosis, Clubbing or edema, No new Rash or bruise   PERTINENT RADIOLOGIC STUDIES: No results found.   PERTINENT LAB RESULTS: CBC: Recent Labs    01/04/23 0149 01/05/23 0419  WBC 8.3 9.0  HGB 9.9* 9.0*  HCT 31.8* 28.4*  PLT 248 221   CMET CMP     Component Value Date/Time   NA 129 (L) 01/06/2023 0309   K 4.0 01/06/2023 0309   CL 94 (L) 01/06/2023 0309   CO2 25 01/06/2023 0309   GLUCOSE 113 (H) 01/06/2023 0309   BUN 12 01/06/2023 0309   CREATININE 0.98 01/06/2023 0309   CALCIUM 8.6 (L) 01/06/2023 0309   PROT 7.4 12/25/2022 0452   ALBUMIN 2.8 (L) 12/25/2022 0452   AST 25 12/25/2022 0452   ALT 24 12/25/2022 0452   ALKPHOS 77 12/25/2022 0452   BILITOT 0.7 12/25/2022 0452   GFRNONAA >60 01/06/2023 0309    GFR Estimated Creatinine Clearance: 70.5 mL/min (by C-G formula based on SCr of 0.98 mg/dL). No results for input(s): "LIPASE", "AMYLASE" in the last 72 hours. No results for input(s): "CKTOTAL", "CKMB", "CKMBINDEX", "TROPONINI" in the last 72 hours. Invalid input(s): "POCBNP" No results for  input(s): "DDIMER" in the last 72 hours. No results for input(s): "HGBA1C" in the last 72 hours. No results for input(s): "CHOL", "HDL", "LDLCALC", "TRIG", "CHOLHDL", "LDLDIRECT" in the last 72 hours. No results for input(s): "TSH", "T4TOTAL", "T3FREE", "THYROIDAB" in the last 72 hours.  Invalid input(s): "FREET3" No results for input(s): "VITAMINB12", "FOLATE", "FERRITIN", "TIBC", "IRON", "RETICCTPCT" in the last 72 hours. Coags: No results for input(s): "INR" in the last 72 hours.  Invalid input(s): "PT" Microbiology: Recent Results (from the past 240 hour(s))   Surgical pcr screen     Status: None   Collection Time: 01/02/23  6:07 PM   Specimen: Nasal Mucosa; Nasal Swab  Result Value Ref Range Status   MRSA, PCR NEGATIVE NEGATIVE Final   Staphylococcus aureus NEGATIVE NEGATIVE Final    Comment: (NOTE) The Xpert SA Assay (FDA approved for NASAL specimens in patients 87 years of age and older), is one component of a comprehensive surveillance program. It is not intended to diagnose infection nor to guide or monitor treatment. Performed at [facility], [address], [place], [place] [address]     FURTHER DISCHARGE INSTRUCTIONS:  Get Medicines reviewed and adjusted: Please take all your medications with you for your next visit with your Primary MD  Laboratory/radiological data: Please request your Primary MD to go over all hospital tests and procedure/radiological results at the follow up, please ask your Primary MD to get all Hospital records sent to his/her office.  In some cases, they will be blood work, cultures and biopsy results pending at the time of your discharge. Please request that your primary care M.D. goes through all the records of your hospital data and follows up on these results.  Also Note the following: If you experience worsening of your admission symptoms, develop shortness of breath, life threatening emergency, suicidal or homicidal thoughts you must seek medical attention immediately by calling 911 or calling your MD immediately  if symptoms less severe.  You must read complete instructions/literature along with all the possible adverse reactions/side effects for all the Medicines you take and that have been prescribed to you. Take any new Medicines after you have completely understood and accpet all the possible adverse reactions/side effects.   Do not drive when taking Pain medications or sleeping medications (Benzodaizepines)  Do not take more than prescribed Pain, Sleep and Anxiety Medications. It is not  advisable to combine anxiety,sleep and pain medications without talking with your primary care practitioner  Special Instructions: If you have smoked or chewed Tobacco  in the last 2 yrs please stop smoking, stop any regular Alcohol  and or any Recreational drug use.  Wear Seat belts while driving.  Please note: You were cared for by a hospitalist during your hospital stay. Once you are discharged, your primary care physician will handle any further medical issues. Please note that NO REFILLS for any discharge medications will be authorized once you are discharged, as it is imperative that you return to your primary care physician (or establish a relationship with a primary care physician if you do not have one) for your post hospital discharge needs so that they can reassess your need for medications and monitor your lab values.  Total Time spent coordinating discharge including counseling, education and face to face time equals greater than 30 minutes.  SignedJeoffrey Massed 01/06/2023 8:26 AM

## 2023-01-09 DIAGNOSIS — K769 Liver disease, unspecified: Secondary | ICD-10-CM

## 2023-01-09 DIAGNOSIS — R2681 Unsteadiness on feet: Secondary | ICD-10-CM

## 2023-01-09 DIAGNOSIS — I10 Essential (primary) hypertension: Secondary | ICD-10-CM

## 2023-01-09 DIAGNOSIS — M48061 Spinal stenosis, lumbar region without neurogenic claudication: Secondary | ICD-10-CM

## 2023-01-09 DIAGNOSIS — R29818 Other symptoms and signs involving the nervous system: Secondary | ICD-10-CM

## 2023-01-09 DIAGNOSIS — R7303 Prediabetes: Secondary | ICD-10-CM

## 2023-01-09 DIAGNOSIS — G629 Polyneuropathy, unspecified: Secondary | ICD-10-CM

## 2023-01-09 DIAGNOSIS — R55 Syncope and collapse: Secondary | ICD-10-CM

## 2023-01-09 DIAGNOSIS — R2689 Other abnormalities of gait and mobility: Secondary | ICD-10-CM

## 2023-01-11 DIAGNOSIS — R2681 Unsteadiness on feet: Secondary | ICD-10-CM

## 2023-01-11 DIAGNOSIS — R109 Unspecified abdominal pain: Secondary | ICD-10-CM

## 2023-01-11 DIAGNOSIS — R2689 Other abnormalities of gait and mobility: Secondary | ICD-10-CM

## 2023-01-11 DIAGNOSIS — R195 Other fecal abnormalities: Secondary | ICD-10-CM

## 2023-01-11 DIAGNOSIS — R7303 Prediabetes: Secondary | ICD-10-CM

## 2023-01-11 DIAGNOSIS — I4892 Unspecified atrial flutter: Secondary | ICD-10-CM

## 2023-01-11 DIAGNOSIS — K5900 Constipation, unspecified: Secondary | ICD-10-CM

## 2023-01-11 DIAGNOSIS — K250 Acute gastric ulcer with hemorrhage: Secondary | ICD-10-CM

## 2023-01-11 DIAGNOSIS — M6281 Muscle weakness (generalized): Secondary | ICD-10-CM

## 2023-01-16 DIAGNOSIS — D509 Iron deficiency anemia, unspecified: Secondary | ICD-10-CM

## 2023-01-16 DIAGNOSIS — R195 Other fecal abnormalities: Secondary | ICD-10-CM

## 2023-01-16 DIAGNOSIS — I4892 Unspecified atrial flutter: Secondary | ICD-10-CM

## 2023-01-16 DIAGNOSIS — R7303 Prediabetes: Secondary | ICD-10-CM

## 2023-01-16 DIAGNOSIS — R2681 Unsteadiness on feet: Secondary | ICD-10-CM

## 2023-01-16 DIAGNOSIS — K5900 Constipation, unspecified: Secondary | ICD-10-CM

## 2023-01-16 DIAGNOSIS — R109 Unspecified abdominal pain: Secondary | ICD-10-CM

## 2023-01-16 DIAGNOSIS — K250 Acute gastric ulcer with hemorrhage: Secondary | ICD-10-CM

## 2023-01-16 DIAGNOSIS — M6281 Muscle weakness (generalized): Secondary | ICD-10-CM

## 2023-01-16 DIAGNOSIS — Z7689 Persons encountering health services in other specified circumstances: Secondary | ICD-10-CM

## 2023-01-16 DIAGNOSIS — R2689 Other abnormalities of gait and mobility: Secondary | ICD-10-CM

## 2023-01-17 ENCOUNTER — Emergency Department (HOSPITAL_COMMUNITY)
Admission: EM | Admit: 2023-01-17 | Discharge: 2023-01-17 | Disposition: A | Discharge status: Home / Self Care | Payer: Medicare PPO | Attending: Emergency Medicine | Admitting: Emergency Medicine

## 2023-01-17 ENCOUNTER — Emergency Department (HOSPITAL_COMMUNITY): Payer: Medicare PPO

## 2023-01-17 DIAGNOSIS — I950 Idiopathic hypotension: Secondary | ICD-10-CM | POA: Diagnosis not present

## 2023-01-17 DIAGNOSIS — R531 Weakness: Secondary | ICD-10-CM | POA: Diagnosis not present

## 2023-01-17 DIAGNOSIS — E860 Dehydration: Secondary | ICD-10-CM

## 2023-01-17 DIAGNOSIS — Z7902 Long term (current) use of antithrombotics/antiplatelets: Secondary | ICD-10-CM | POA: Insufficient documentation

## 2023-01-17 LAB — BASIC METABOLIC PANEL
Anion gap: 10 (ref 5–15)
BUN: 13 mg/dL (ref 8–23)
CO2: 22 mmol/L (ref 22–32)
Calcium: 8.3 mg/dL — ABNORMAL LOW (ref 8.9–10.3)
Chloride: 100 mmol/L (ref 98–111)
Creatinine, Ser: 1.14 mg/dL (ref 0.61–1.24)
GFR, Estimated: >60 mL/min (ref >60)
Glucose, Bld: 135 mg/dL — ABNORMAL HIGH (ref 70–99)
Potassium: 4 mmol/L (ref 3.5–5.1)
Sodium: 132 mmol/L — ABNORMAL LOW (ref 135–145)

## 2023-01-17 LAB — CBC
HCT: 27.1 % — ABNORMAL LOW (ref 39.0–52.0)
Hemoglobin: 8.5 g/dL — ABNORMAL LOW (ref 13.0–17.0)
MCH: 25.8 pg — ABNORMAL LOW (ref 26.0–34.0)
MCHC: 31.4 g/dL (ref 30.0–36.0)
MCV: 82.4 fL (ref 80.0–100.0)
Platelets: 309 10*3/uL (ref 150–400)
RBC: 3.29 MIL/uL — ABNORMAL LOW (ref 4.22–5.81)
RDW: 19.7 % — ABNORMAL HIGH (ref 11.5–15.5)
WBC: 4.4 10*3/uL (ref 4.0–10.5)
nRBC: 0 % (ref 0.0–0.2)

## 2023-01-17 LAB — URINALYSIS, ROUTINE W REFLEX MICROSCOPIC
Bilirubin Urine: NEGATIVE
Glucose, UA: NEGATIVE mg/dL
Hgb urine dipstick: NEGATIVE
Ketones, ur: NEGATIVE mg/dL
Leukocytes,Ua: NEGATIVE
Nitrite: NEGATIVE
Protein, ur: NEGATIVE mg/dL
Specific Gravity, Urine: 1.011 (ref 1.005–1.030)
pH: 6 (ref 5.0–8.0)

## 2023-01-17 LAB — CBG MONITORING, ED: Glucose-Capillary: 117 mg/dL — ABNORMAL HIGH (ref 70–99)

## 2023-01-17 LAB — LACTIC ACID, PLASMA: Lactic Acid, Venous: 1.6 mmol/L (ref 0.5–1.9)

## 2023-01-17 MED ORDER — SODIUM CHLORIDE 0.9 % IV BOLUS
1000.0000 mL | Freq: Once | INTRAVENOUS | Status: AC
  Administered 2023-01-17: 1000 mL via INTRAVENOUS

## 2023-01-17 NOTE — ED Triage Notes (Signed)
PT bib [facility] from [facility] where he is for rehab after a spinal surgery a week ago. Pt has been doing well and took all of his medications this am including blood pressure medication. EMS called out for hypotension and weakness. Pt found to have bp 70/40.Pt given NS en route.

## 2023-01-17 NOTE — ED Notes (Signed)
Patient unable to complete orthostatic vital signs. Patient reports weakness in legs when standing for more than 30 seconds. Denies lightheadedness.

## 2023-01-17 NOTE — Discharge Instructions (Signed)
Thank you for coming to [facility] Emergency Department. You were seen for generalized weakness, low blood pressure. We did an exam, labs, and these showed that you respond well to fluids.  You are likely dehydrated.  Please stay well-hydrated at home and follow-up with your primary care physician or your postop appointment in the next 1 to 2 weeks.  Do not hesitate to return to the ED or call 911 if you experience: -Worsening symptoms -Lightheadedness, passing out -Fevers/chills -Anything else that concerns you

## 2023-01-17 NOTE — ED Notes (Signed)
Per EDP, no need for second lactic

## 2023-01-17 NOTE — ED Provider Notes (Signed)
[place] [facility] AT [facility] Provider Note   CSN: [id number] Arrival date & time: 01/17/23  1326     History  No chief complaint on file.   Kule Hagedorn is a 88 y.o. male.  Patient is an 88 year old gentleman s/p lumbar laminectomy, presenting to the ED with weakness and hypotension. Patient reports for the last couple days feeling generally weak and fatigued. Patient has weakness and numbness in his left leg, which is a chronic problem and was the indication for his laminectomy. Patient indicates back pain and is tender to palpation of the lumbar spine, adjacent to well-healing laminectomy incision. He denies fever, chills, n/v, poor oral intake, only feeling unwell. Denies saddle anesthesia, urinary/bowel incontinence.         Home Medications Prior to Admission medications   Medication Sig Start Date End Date Taking? Authorizing Provider  acetaminophen (TYLENOL) 325 MG tablet Take 2 tablets (650 mg total) by mouth every 6 (six) hours as needed for mild pain (or Fever >/= 101). 10/24/22   Elgergawy, [name], MD  albuterol (VENTOLIN HFA) 108 (90 Base) MCG/ACT inhaler Inhale 2 puffs into the lungs every 4 (four) hours as needed for wheezing or shortness of breath. 02/24/20   [provider]  atorvastatin (LIPITOR) 40 MG tablet Take 40 mg by mouth at bedtime. 08/24/22   [provider]  baclofen (LIORESAL) 10 MG tablet Take 10 mg by mouth 3 (three) times daily.    [provider]  benzocaine (ORAJEL) 10 % mucosal gel Use as directed in the mouth or throat 4 (four) times daily as needed for mouth pain. 01/06/23   Ghimire, Werner Lean, MD  clopidogrel (PLAVIX) 75 MG tablet Take 1 tablet (75 mg total) by mouth daily. START ON 7/10 01/10/23   Ghimire, Werner Lean, MD  DULoxetine (CYMBALTA) 30 MG capsule Take 30 mg by mouth 2 (two) times daily.    [provider]  Ferrous Sulfate (IRON PO) Take 1 tablet by mouth daily.    [provider]  gabapentin (NEURONTIN) 300 MG capsule Take 300 mg by mouth 3 (three) times daily.    [provider]  hydrALAZINE (APRESOLINE) 50 MG tablet Take 1 tablet (50 mg total) by mouth every 8 (eight) hours. 01/06/23   Ghimire, Werner Lean, MD  isosorbide mononitrate (IMDUR) 30 MG 24 hr tablet Take 30 mg by mouth daily. 08/24/22   [provider]  losartan (COZAAR) 25 MG tablet Take 1 tablet (25 mg total) by mouth daily. 10/26/22   Elgergawy, [name], MD  metoprolol succinate (TOPROL-XL) 50 MG 24 hr tablet Take 50 mg by mouth daily. 08/24/22   [provider]  oxyCODONE (OXY IR/ROXICODONE) 5 MG immediate release tablet Take 1 tablet (5 mg total) by mouth every 6 (six) hours as needed for moderate pain or severe pain. 01/06/23   Ghimire, Werner Lean, MD  pantoprazole (PROTONIX) 40 MG tablet Take 1 tablet (40 mg total) by mouth 2 (two) times daily before a meal. Patient not taking: Reported on 12/24/2022 10/24/22 12/23/22  Elgergawy, [name], MD  traZODone (DESYREL) 50 MG tablet Take 50 mg by mouth at bedtime.    [provider]  XARELTO 20 MG TABS tablet Take 1 tablet (20 mg total) by mouth every morning. START ON 7/10 01/10/23   Ghimire, Werner Lean, MD      Allergies    Iodine and Shellfish allergy    Review of Systems  Review of Systems  Constitutional:  Positive for fatigue. Negative for appetite change, chills and fever.  Respiratory:  Negative for cough and shortness of breath.   Cardiovascular:  Negative for chest pain.  Gastrointestinal:  Negative for abdominal pain, constipation, diarrhea, nausea and vomiting.  Genitourinary:  Negative for difficulty urinating and dysuria.  Musculoskeletal:  Positive for back pain.    Physical Exam Updated Vital Signs BP 131/68   Pulse (!) 58   Temp 98.9 F (37.2 C) (Oral)   Resp (!) 7   SpO2 100%  Physical Exam Constitutional:      Appearance: He is ill-appearing.  Cardiovascular:     Rate and Rhythm:  Normal rate and regular rhythm.     Heart sounds: Normal heart sounds.  Pulmonary:     Effort: Pulmonary effort is normal. No respiratory distress.     Breath sounds: Normal breath sounds.  Abdominal:     General: There is no distension.     Palpations: Abdomen is soft.     Tenderness: There is no abdominal tenderness.  Musculoskeletal:     Comments: Tender to palpation of the lumbar spine. Well-healing incision with no surrounding erythema  Neurological:     General: No focal deficit present.     Mental Status: He is alert.     Comments: Weakness and numbness of left leg. Patient states this is a chronic problem, and was the reason for the laminectomy      ED Results / Procedures / Treatments   Labs (all labs ordered are listed, but only abnormal results are displayed) Labs Reviewed  BASIC METABOLIC PANEL - Abnormal; Notable for the following components:      Result Value   Sodium 132 (*)    Glucose, Bld 135 (*)    Calcium 8.3 (*)    All other components within normal limits  CBC - Abnormal; Notable for the following components:   RBC 3.29 (*)    Hemoglobin 8.5 (*)    HCT 27.1 (*)    MCH 25.8 (*)    RDW 19.7 (*)    All other components within normal limits  CBG MONITORING, ED - Abnormal; Notable for the following components:   Glucose-Capillary 117 (*)    All other components within normal limits  CULTURE, BLOOD (ROUTINE X 2)  URINALYSIS, ROUTINE W REFLEX MICROSCOPIC  LACTIC ACID, PLASMA  LACTIC ACID, PLASMA    EKG EKG Interpretation Date/Time:  Wednesday January 17 2023 13:37:01 EDT Ventricular Rate:  61 PR Interval:    QRS Duration:  88 QT Interval:  406 QTC Calculation: 408 R Axis:   -9  Text Interpretation: Atrial flutter with 4:1 A-V conduction Nonspecific ST abnormality Abnormal ECG When compared with ECG of 04-Jan-2023 11:32, PREVIOUS ECG IS PRESENT when compard to prior, similar aflutter. No STEMI Confirmed by Theda Belfast ([id number]) on 01/17/2023 2:25:40  PM  Radiology DG Chest Portable 1 View  Result Date: 01/17/2023 CLINICAL DATA:  fatigue, shortness of breath EXAM: PORTABLE CHEST 1 VIEW COMPARISON:  CXR 12/23/22 FINDINGS: No pleural effusion. No pneumothorax. Unchanged cardiac and mediastinal contours. There are prominent bilateral interstitial opacities that could represent pulmonary venous congestion or atypical infection. No radiographically apparent displaced rib fractures. Visualized upper abdomen unremarkable. IMPRESSION: Prominent bilateral interstitial opacities could represent pulmonary venous congestion or atypical infection. Electronically Signed   By: Lorenza Cambridge M.D.   On: 01/17/2023 15:40    Procedures Procedures    Medications Ordered in ED Medications  sodium chloride  0.9 % bolus 1,000 mL (1,000 mLs Intravenous New Bag/Given 01/17/23 1522)    ED Course/ Medical Decision Making/ A&P                             Medical Decision Making Amount and/or Complexity of Data Reviewed Labs: ordered. Radiology: ordered.  Medical Decision Making:   Yashar Hemme is a 88 y.o. male who presented to the ED today with fatigue detailed above.    Complete initial physical exam performed, notably the patient  was tired.    Reviewed and confirmed nursing documentation for past medical history, family history, social history.    Initial Plan:    Screening labs including CBC and Metabolic panel to evaluate for infectious or metabolic etiology of disease.  Urinalysis with reflex culture ordered to evaluate for UTI or relevant urologic/nephrologic pathology.  CXR to evaluate for structural/infectious intrathoracic pathology.  EKG to evaluate for cardiac pathology Objective evaluation as below reviewed   EKG EKG was reviewed independently. Rate, rhythm, axis, intervals all examined and without medically relevant abnormality. ST segments without concerns for elevations.    Radiology:  All images reviewed independently. Agree with  radiology report at this time.   DG Chest Portable 1 View  Result Date: 01/17/2023 CLINICAL DATA:  fatigue, shortness of breath EXAM: PORTABLE CHEST 1 VIEW COMPARISON:  CXR 12/23/22 FINDINGS: No pleural effusion. No pneumothorax. Unchanged cardiac and mediastinal contours. There are prominent bilateral interstitial opacities that could represent pulmonary venous congestion or atypical infection. No radiographically apparent displaced rib fractures. Visualized upper abdomen unremarkable. IMPRESSION: Prominent bilateral interstitial opacities could represent pulmonary venous congestion or atypical infection. Electronically Signed   By: Lorenza Cambridge M.D.   On: 01/17/2023 15:40   DG Lumbar Spine 1 View  Result Date: 01/03/2023 CLINICAL DATA:  Elective surgery. EXAM: LUMBAR SPINE - 1 VIEW COMPARISON:  CT 12/27/2022 FINDINGS: Tissue spreaders in place posteriorly with a probe directed at the L3-4 disc level. IMPRESSION: Surgical probe directed at the L3-4 disc level. Electronically Signed   By: Paulina Fusi M.D.   On: 01/03/2023 12:44   DG C-Arm 1-60 Min-No Report  Result Date: 01/03/2023 Fluoroscopy was utilized by the requesting physician.  No radiographic interpretation.   IR INJECT DIAG/THERA/INC NEEDLE/CATH/PLC EPI/LUMB/SAC W/IMG  Result Date: 12/27/2022 CLINICAL DATA:  Acute myelopathy EXAM: CERVICAL AND LUMBAR MYELOGRAM FLUOROSCOPY: Radiation Exposure Index (as provided by the fluoroscopic device): 8 mGy air Kerma TECHNIQUE: After patient anesthetized by department of anesthesia and placed prone on the procedure table, an appropriate entry site was determined under fluoroscopy. Operator donned sterile gloves and mask. Skin site was marked, prepped with Betadine, and draped in usual sterile fashion, and infiltrated locally with 1% lidocaine. A 20 gauge spinal needle was advanced into the thecal sac at L2 from a left parasagittal approach. Clear colorless CSF returned. 10 mL Omnipaque 300 were  administered intrathecally for myelography. Representative postcontrast lumbar and cervical images were obtained. COMPARISON:  None Available. FINDINGS: Lumbar imaging confirms intrathecal administration of the contrast. Cervical imaging demonstrates contrast up to the midthoracic cervical spine. See subsequent CT dictated separately for detailed evaluation of cervical, thoracic, and lumbar regions. IMPRESSION: Technically successful  myelogram. Electronically Signed   By: Corlis Leak M.D.   On: 12/27/2022 14:37   CT CERVICAL SPINE W CONTRAST  Result Date: 12/27/2022 CLINICAL DATA:  Myelopathy, acute common cervical spine EXAM: CT MYELOGRAPHY CERVICAL SPINE TECHNIQUE: CT imaging of the cervical  spine was performed after Isovue 300M contrast administration. Multiplanar CT image reconstructions were also generated. RADIATION DOSE REDUCTION: This exam was performed according to the departmental dose-optimization program which includes automated exposure control, adjustment of the mA and/or kV according to patient size and/or use of iterative reconstruction technique. COMPARISON:  CT 12/23/2022 FINDINGS: Injection procedure dictated separately. Alignment: Straightening of the normal cervical lordosis. Vertebrae: No fracture or focal bone lesion. Some motion degradation in the mid cervical region. Cord: See description of each level below. Posterior Fossa and paraspinal tissues: Negative Disc levels: Foramen magnum is widely patent. C1-2 shows ordinary osteoarthritis but no encroachment upon the neural structures. C2-3: Facet osteoarthritis on the left. Uncovertebral hypertrophy left more than right. No central canal stenosis. Moderate bony foraminal narrowing on the left. C3-4: Endplate and uncovertebral osteophytes. Facet arthropathy right worse than left. Narrowing of the subarachnoid space of the central canal but no gross cord compression. Severe bilateral bony foraminal stenosis. C4-5: Endplate osteophytes but  no compressive central canal stenosis. Facet osteoarthritis right worse than left. Bilateral bony foraminal narrowing, mild on the left and severe on the right. C5-6: Motion degraded. Endplate osteophytes efface the ventral subarachnoid space. There appears to be subarachnoid space present dorsal to the cord and I would doubt significant cervical cord compression. There is moderate bilateral bony foraminal stenosis. C6-7: This level also suffers from motion degradation. There appears to be chronic fusion of the vertebral bodies. Sufficient patency of the canal and foramina. C7-T1: Significant motion degradation. Endplate osteophytes and uncovertebral osteophytes. Bilateral facet degeneration. No likely compressive central canal stenosis. Moderate bilateral foraminal narrowing. IMPRESSION: 1. Motion degraded study. 2. Advanced chronic degenerative changes throughout the cervical region as outlined above. No likely compressive central canal stenosis. There is some narrowing of the subarachnoid space of the central canal at C3-4 and C5-6, but no gross cord compression. 3. Bony foraminal stenosis that could be significant on the left at C2-3, bilaterally at C3-4, on the right at C4-5 and bilaterally at C5-6 and C7-T1. 4. Chronic fusion of the C6 and C7 vertebral bodies. Sufficient patency of the canal and foramina. Electronically Signed   By: Paulina Fusi M.D.   On: 12/27/2022 13:02   CT LUMBAR SPINE W CONTRAST  Result Date: 12/27/2022 CLINICAL DATA:  Acute myelopathy. EXAM: CT MYELOGRAPHY LUMBAR SPINE TECHNIQUE: CT imaging of the lumbar spine was performed after Isovue 200M contrast administration. Multiplanar CT image reconstructions were also generated. RADIATION DOSE REDUCTION: This exam was performed according to the departmental dose-optimization program which includes automated exposure control, adjustment of the mA and/or kV according to patient size and/or use of iterative reconstruction technique.  COMPARISON:  12/24/2022 FINDINGS: Contrast injection described separately. Segmentation:  5 lumbar type vertebral bodies assumed. Alignment: No significant malalignment. Vertebrae: No fracture or focal bone lesion. Conus medullaris: Extends to the L1-2 level and appears normal. Paraspinal and other soft tissues: Aortic atherosclerosis. Disc levels: L1-2: Bulging of the disc. Facet and ligamentous hypertrophy. Mild canal stenosis but no likely central compression. Foraminal narrowing left more than right could affect the exiting L1 nerves. L2-3: Endplate osteophytes and bulging of the disc. Facet and ligamentous hypertrophy. Moderate multifactorial spinal stenosis. Bilateral foraminal stenosis. L3-4: Endplate osteophytes and shallow protrusion of the disc. Facet and ligamentous hypertrophy. Severe multifactorial canal and foraminal stenosis. L4-5: Endplate osteophytes and protrusion of the disc. Pronounced facet and ligamentous hypertrophy. Severe multifactorial spinal stenosis and foraminal stenosis. L5-S1: Endplate osteophytes. Facet and ligamentous hypertrophy. Narrowing of the subarticular lateral  recesses left more than right. Bilateral foraminal stenosis that could affect the exiting L5 nerves, worse on the left. IMPRESSION: 1. Advanced chronic degenerative changes throughout the lumbar region as outlined above. Severe multifactorial spinal stenosis at L3-4 and L4-5. Moderate multifactorial spinal stenosis at L2-3. Lateral recess stenosis at L5-S1 left more than right. Foraminal stenosis at L1-2, L2-3, L3-4 and L4-5 and at L5-S1. Aortic Atherosclerosis (ICD10-I70.0). Electronically Signed   By: Paulina Fusi M.D.   On: 12/27/2022 12:03   CT THORACIC SPINE W CONTRAST  Result Date: 12/27/2022 CLINICAL DATA:  Myelopathy EXAM: CT MYELOGRAPHY THORACIC SPINE TECHNIQUE: CT imaging of the thoracic spine was performed after intrathecal contrast administration. Multiplanar CT image reconstructions were also generated.  RADIATION DOSE REDUCTION: This exam was performed according to the departmental dose-optimization program which includes automated exposure control, adjustment of the mA and/or kV according to patient size and/or use of iterative reconstruction technique. COMPARISON:  CT 3 days ago FINDINGS: Contrast injection described separately. Alignment:  Normal Vertebrae: No fracture or focal bone lesion. Cord: No spinal cord compression or visible cord lesion by myelography. Normal cord diameter. Paraspinal and other soft tissues:  Dependent pulmonary atelectasis. Disc levels: No significant disc level pathology. No disc herniation or compressive stenosis of the canal or foramina. Ordinary facet osteoarthritis, typical for age, arguably less advanced than often seen. IMPRESSION: 1. No spinal cord compression or visible cord lesion by myelography. 2. Ordinary facet osteoarthritis, typical for age, arguably less advanced than often seen. No disc herniation or compressive stenosis of the canal or foramina. Electronically Signed   By: Paulina Fusi M.D.   On: 12/27/2022 11:58   ECHOCARDIOGRAM COMPLETE  Result Date: 12/25/2022    ECHOCARDIOGRAM REPORT   Patient Name:   CLEARENCE TWOREK Date of Exam: 12/25/2022 Medical Rec #:  [id number]     Height:       71.0 in Accession #:    [id number]    Weight:       245.0 lb Date of Birth:  [birth year]    BSA:          2.298 m Patient Age:    87 years      BP:           170/89 mmHg Patient Gender: M             HR:           58 bpm. Exam Location:  Inpatient Procedure: 2D Echo, Cardiac Doppler and Color Doppler Indications:    Syncope R55  History:        Patient has prior history of Echocardiogram examinations, most                 recent 03/12/2020. Stroke, Arrythmias:Atrial Fibrillation and                 Atrial Flutter, Signs/Symptoms:Syncope and Chest Pain; Risk                 Factors:Hypertension.  Sonographer:    Lucendia Herrlich Referring Phys: [id number] OLADAPO ADEFESO IMPRESSIONS  1.  Left ventricular ejection fraction, by estimation, is 50 to 55%. The left ventricle has low normal function. The left ventricle has no regional wall motion abnormalities. There is moderate concentric left ventricular hypertrophy. Left ventricular diastolic function could not be evaluated.  2. Right ventricular systolic function was not well visualized. The right ventricular size is not well visualized. There is normal pulmonary artery systolic pressure.  3. Left atrial size  was mild to moderately dilated.  4. The mitral valve is normal in structure. Mild mitral valve regurgitation. No evidence of mitral stenosis.  5. The aortic valve is tricuspid. There is mild calcification of the aortic valve. There is mild thickening of the aortic valve. Aortic valve regurgitation is not visualized.  6. Aortic dilatation noted. There is mild dilatation of the ascending aorta, measuring 40 mm.  7. The inferior vena cava is normal in size with greater than 50% respiratory variability, suggesting right atrial pressure of 3 mmHg. Comparison(s): No significant change from prior study. FINDINGS  Left Ventricle: Left ventricular ejection fraction, by estimation, is 50 to 55%. The left ventricle has low normal function. The left ventricle has no regional wall motion abnormalities. The left ventricular internal cavity size was normal in size. There is moderate concentric left ventricular hypertrophy. Left ventricular diastolic function could not be evaluated due to atrial fibrillation. Left ventricular diastolic function could not be evaluated. Right Ventricle: The right ventricular size is not well visualized. Right vetricular wall thickness was not well visualized. Right ventricular systolic function was not well visualized. There is normal pulmonary artery systolic pressure. The tricuspid regurgitant velocity is 2.82 m/s, and with an assumed right atrial pressure of 3 mmHg, the estimated right ventricular systolic pressure is 34.8  mmHg. Left Atrium: Left atrial size was mild to moderately dilated. Right Atrium: Right atrial size was not well visualized. Pericardium: There is no evidence of pericardial effusion. Mitral Valve: The mitral valve is normal in structure. Mild mitral valve regurgitation. No evidence of mitral valve stenosis. Tricuspid Valve: The tricuspid valve is normal in structure. Tricuspid valve regurgitation is mild . No evidence of tricuspid stenosis. Aortic Valve: The aortic valve is tricuspid. There is mild calcification of the aortic valve. There is mild thickening of the aortic valve. Aortic valve regurgitation is not visualized. Aortic valve peak gradient measures 6.6 mmHg. Pulmonic Valve: The pulmonic valve was not well visualized. Pulmonic valve regurgitation is mild. No evidence of pulmonic stenosis. Aorta: Aortic dilatation noted. There is mild dilatation of the ascending aorta, measuring 40 mm. Venous: The inferior vena cava is normal in size with greater than 50% respiratory variability, suggesting right atrial pressure of 3 mmHg. IAS/Shunts: The interatrial septum was not well visualized.  LEFT VENTRICLE PLAX 2D LVIDd:         4.00 cm   Diastology LVIDs:         2.90 cm   LV e' medial:    7.53 cm/s LV PW:         1.50 cm   LV E/e' medial:  10.5 LV IVS:        1.80 cm   LV e' lateral:   9.55 cm/s LVOT diam:     2.20 cm   LV E/e' lateral: 8.3 LV SV:         56 LV SV Index:   24 LVOT Area:     3.80 cm  RIGHT VENTRICLE             IVC RV S prime:     10.90 cm/s  IVC diam: 1.70 cm TAPSE (M-mode): 1.6 cm LEFT ATRIUM             Index LA diam:        4.90 cm 2.13 cm/m LA Vol (A2C):   69.2 ml 30.11 ml/m LA Vol (A4C):   73.9 ml 32.15 ml/m LA Biplane Vol: 73.4 ml 31.93 ml/m  AORTIC VALVE  PULMONIC VALVE AV Area (Vmax): 2.14 cm     PR End Diast Vel: 5.29 msec AV Vmax:        128.00 cm/s AV Peak Grad:   6.6 mmHg LVOT Vmax:      71.92 cm/s LVOT Vmean:     44.750 cm/s LVOT VTI:       0.146 m  AORTA Ao Root  diam: 3.70 cm Ao Asc diam:  4.00 cm MITRAL VALVE               TRICUSPID VALVE MV Area (PHT): 5.02 cm    TR Peak grad:   31.8 mmHg MV Decel Time: 151 msec    TR Vmax:        282.00 cm/s MR Peak grad: 109.0 mmHg MR Vmax:      522.00 cm/s  SHUNTS MV E velocity: 79.30 cm/s  Systemic VTI:  0.15 m MV A velocity: 54.00 cm/s  Systemic Diam: 2.20 cm MV E/A ratio:  1.47 Jodelle Red MD Electronically signed by Jodelle Red MD Signature Date/Time: 12/25/2022/4:49:14 PM    Final    EEG adult  Result Date: 12/24/2022 Charlsie Quest, MD     12/25/2022  8:52 AM Patient Name: Kharee Honor MRN: [id number] Epilepsy Attending: Charlsie Quest Referring Physician/Provider: Marjorie Smolder, NP Date: 12/24/2022 Duration: 22.28 mins Patient history: 88 year old patient with history of A-fib on Xarelto, CAD, sciatica, MI and stroke presents after 2 falls with left lower extremity weakness, spasms of left greater than right lower extremity and a transient episode of garbled speech and disorientation. Patient is unable to obtain MRI due to retained metal in head. EEG to evaluate for seizure. Level of alertness: Awake AEDs during EEG study: GBP Technical aspects: This EEG study was done with scalp electrodes positioned according to the 10-20 International system of electrode placement. Electrical activity was reviewed with band pass filter of 1-70Hz , sensitivity of 7 uV/mm, display speed of 86mm/sec with a 60Hz  notched filter applied as appropriate. EEG data were recorded continuously and digitally stored.  Video monitoring was available and reviewed as appropriate. Description: The posterior dominant rhythm consists of 9 Hz activity of moderate voltage (25-35 uV) seen predominantly in posterior head regions, symmetric and reactive to eye opening and eye closing. Physiologic photic driving was not seen during photic stimulation.  Hyperventilation was not performed.   During photic stimulation patient was noted  to have subtle twitching in bilateral lower extremities. Concomitant eeg before, during and after the event didn't show any eeg change to suggest seizure. IMPRESSION: This study is within normal limits. No seizures or epileptiform discharges were seen throughout the recording. During photic stimulation patient was noted to have subtle twitching in bilateral lower extremities without concomitant eeg change, This was not an epileptic event. A normal interictal EEG does not exclude the diagnosis of epilepsy. Priyanka Annabelle Harman   CT LUMBAR SPINE WO CONTRAST  Result Date: 12/24/2022 CLINICAL DATA:  Spinal cord injury, follow up EXAM: CT  THORACIC AND LUMBAR SPINE WITHOUT CONTRAST TECHNIQUE: Multidetector CT imaging of the thoracic and lumbar spine was performed without intravenous contrast. Multiplanar CT image reconstructions were also generated. RADIATION DOSE REDUCTION: This exam was performed according to the departmental dose-optimization program which includes automated exposure control, adjustment of the mA and/or kV according to patient size and/or use of iterative reconstruction technique. COMPARISON:  Lumbar spine radiograph 12/23/22 FINDINGS: CT THORACIC SPINE FINDINGS Alignment: Normal. Vertebrae: No acute fracture or focal pathologic process. Paraspinal  and other soft tissues: Trace right pleural effusion. Moderate coronary artery calcifications. Hypodense lesion of the upper pole of the right kidney favored to represent a simple renal cyst. Aortic atherosclerotic calcifications. 7 mm hypodense lesion of the inferior aspect of the right hepatic lobe is assessed due to the absence of IV contrast too small to accurately characterize. Disc levels: Evidence of high-grade spinal canal stenosis. CT LUMBAR SPINE FINDINGS Limitations: Assessment lumbar spine is slightly limited due to motion artifact, particularly in the lower lumbar spine within this limitation Segmentation:  5 lumbar type vertebral bodies.  Alignment: Normal. Vertebrae: Within the limitations of a motion degraded exam, no evidence of an acute fracture. Vertebral body heights are preserved. Paraspinal and other soft tissues: Incidentally noted circumferential bladder wall thickening, which is nonspecific, but can be seen in the setting of cystitis or chronic bladder outlet obstruction. Correlate with urinalysis. Prostate is enlarged. Disc levels: There may be moderate spinal canal narrowing at L2-L3 and moderate to severe spinal canal narrowing at L4-L5. IMPRESSION: 1. Within the limitations of a motion degraded exam, no evidence of acute fracture or traumatic subluxation of the thoracic or lumbar spine. 2. Incidentally noted circumferential bladder wall thickening, which is nonspecific, but can be seen in the setting of cystitis or chronic bladder outlet obstruction. Correlate with urinalysis. 3. Trace right pleural effusion. 4. Aortic atherosclerosis. 5. 7 mm hypodense lesion of the inferior aspect of the right hepatic lobe is assessed due to the absence of IV contrast too small to accurately characterize. Recommend further evaluation with a liver ultrasound. Aortic Atherosclerosis (ICD10-I70.0). Electronically Signed   By: Lorenza Cambridge M.D.   On: 12/24/2022 11:45   CT THORACIC SPINE WO CONTRAST  Result Date: 12/24/2022 CLINICAL DATA:  Spinal cord injury, follow up EXAM: CT  THORACIC AND LUMBAR SPINE WITHOUT CONTRAST TECHNIQUE: Multidetector CT imaging of the thoracic and lumbar spine was performed without intravenous contrast. Multiplanar CT image reconstructions were also generated. RADIATION DOSE REDUCTION: This exam was performed according to the departmental dose-optimization program which includes automated exposure control, adjustment of the mA and/or kV according to patient size and/or use of iterative reconstruction technique. COMPARISON:  Lumbar spine radiograph 12/23/22 FINDINGS: CT THORACIC SPINE FINDINGS Alignment: Normal. Vertebrae:  No acute fracture or focal pathologic process. Paraspinal and other soft tissues: Trace right pleural effusion. Moderate coronary artery calcifications. Hypodense lesion of the upper pole of the right kidney favored to represent a simple renal cyst. Aortic atherosclerotic calcifications. 7 mm hypodense lesion of the inferior aspect of the right hepatic lobe is assessed due to the absence of IV contrast too small to accurately characterize. Disc levels: Evidence of high-grade spinal canal stenosis. CT LUMBAR SPINE FINDINGS Limitations: Assessment lumbar spine is slightly limited due to motion artifact, particularly in the lower lumbar spine within this limitation Segmentation:  5 lumbar type vertebral bodies. Alignment: Normal. Vertebrae: Within the limitations of a motion degraded exam, no evidence of an acute fracture. Vertebral body heights are preserved. Paraspinal and other soft tissues: Incidentally noted circumferential bladder wall thickening, which is nonspecific, but can be seen in the setting of cystitis or chronic bladder outlet obstruction. Correlate with urinalysis. Prostate is enlarged. Disc levels: There may be moderate spinal canal narrowing at L2-L3 and moderate to severe spinal canal narrowing at L4-L5. IMPRESSION: 1. Within the limitations of a motion degraded exam, no evidence of acute fracture or traumatic subluxation of the thoracic or lumbar spine. 2. Incidentally noted circumferential bladder wall thickening, which is  nonspecific, but can be seen in the setting of cystitis or chronic bladder outlet obstruction. Correlate with urinalysis. 3. Trace right pleural effusion. 4. Aortic atherosclerosis. 5. 7 mm hypodense lesion of the inferior aspect of the right hepatic lobe is assessed due to the absence of IV contrast too small to accurately characterize. Recommend further evaluation with a liver ultrasound. Aortic Atherosclerosis (ICD10-I70.0). Electronically Signed   By: Lorenza Cambridge M.D.    On: 12/24/2022 11:45   CT HEAD WO CONTRAST  Result Date: 12/23/2022 CLINICAL DATA:  Neuro deficit, acute, stroke suspected; Neck trauma (Age >= 65y) EXAM: CT HEAD WITHOUT CONTRAST CT CERVICAL SPINE WITHOUT CONTRAST TECHNIQUE: Multidetector CT imaging of the head and cervical spine was performed following the standard protocol without intravenous contrast. Multiplanar CT image reconstructions of the cervical spine were also generated. RADIATION DOSE REDUCTION: This exam was performed according to the departmental dose-optimization program which includes automated exposure control, adjustment of the mA and/or kV according to patient size and/or use of iterative reconstruction technique. COMPARISON:  CT head 10/21/2022 FINDINGS: CT HEAD FINDINGS Brain: Cerebral ventricle sizes are concordant with the degree of cerebral volume loss. Patchy and confluent areas of decreased attenuation are noted throughout the deep and periventricular white matter of the cerebral hemispheres bilaterally, compatible with chronic microvascular ischemic disease. No evidence of large-territorial acute infarction. No parenchymal hemorrhage. No mass lesion. No extra-axial collection. No mass effect or midline shift. No hydrocephalus. Basilar cisterns are patent. Vascular: No hyperdense vessel. Skull: No acute fracture or focal lesion. Sinuses/Orbits: Paranasal sinuses and mastoid air cells are clear. Redemonstration of right orbit surgical changes. Otherwise the orbits are unremarkable. Other: Similar retained metallic densities along the scalp. CT CERVICAL SPINE FINDINGS Alignment: Normal. Skull base and vertebrae: Markedly limited evaluation due to motion artifact. Multilevel degenerative changes. Question osseous neural foraminal stenosis. No severe osseous central canal stenosis. Soft tissues and spinal canal: No prevertebral fluid or swelling. No visible canal hematoma. Upper chest: Unremarkable. Other: None. IMPRESSION: 1. No acute  intracranial abnormality. 2. Markedly limited evaluation of the spine for fracture due to motion artifact. Recommend repeat Electronically Signed   By: Tish Frederickson M.D.   On: 12/23/2022 21:40   CT Cervical Spine Wo Contrast  Result Date: 12/23/2022 CLINICAL DATA:  Neuro deficit, acute, stroke suspected; Neck trauma (Age >= 65y) EXAM: CT HEAD WITHOUT CONTRAST CT CERVICAL SPINE WITHOUT CONTRAST TECHNIQUE: Multidetector CT imaging of the head and cervical spine was performed following the standard protocol without intravenous contrast. Multiplanar CT image reconstructions of the cervical spine were also generated. RADIATION DOSE REDUCTION: This exam was performed according to the departmental dose-optimization program which includes automated exposure control, adjustment of the mA and/or kV according to patient size and/or use of iterative reconstruction technique. COMPARISON:  CT head 10/21/2022 FINDINGS: CT HEAD FINDINGS Brain: Cerebral ventricle sizes are concordant with the degree of cerebral volume loss. Patchy and confluent areas of decreased attenuation are noted throughout the deep and periventricular white matter of the cerebral hemispheres bilaterally, compatible with chronic microvascular ischemic disease. No evidence of large-territorial acute infarction. No parenchymal hemorrhage. No mass lesion. No extra-axial collection. No mass effect or midline shift. No hydrocephalus. Basilar cisterns are patent. Vascular: No hyperdense vessel. Skull: No acute fracture or focal lesion. Sinuses/Orbits: Paranasal sinuses and mastoid air cells are clear. Redemonstration of right orbit surgical changes. Otherwise the orbits are unremarkable. Other: Similar retained metallic densities along the scalp. CT CERVICAL SPINE FINDINGS Alignment: Normal. Skull base and vertebrae:  Markedly limited evaluation due to motion artifact. Multilevel degenerative changes. Question osseous neural foraminal stenosis. No severe  osseous central canal stenosis. Soft tissues and spinal canal: No prevertebral fluid or swelling. No visible canal hematoma. Upper chest: Unremarkable. Other: None. IMPRESSION: 1. No acute intracranial abnormality. 2. Markedly limited evaluation of the spine for fracture due to motion artifact. Recommend repeat Electronically Signed   By: Tish Frederickson M.D.   On: 12/23/2022 21:40   DG Chest 1 View  Result Date: 12/23/2022 CLINICAL DATA:  Weakness, fell EXAM: CHEST  1 VIEW COMPARISON:  10/18/2022 FINDINGS: Single frontal view of the chest demonstrates a stable enlarged cardiac silhouette. No acute airspace disease, effusion, or pneumothorax. No acute displaced fracture. IMPRESSION: 1. Stable chest, no acute process. Electronically Signed   By: Sharlet Salina M.D.   On: 12/23/2022 20:41   DG Lumbar Spine Complete  Result Date: 12/23/2022 CLINICAL DATA:  Weakness, fell EXAM: LUMBAR SPINE - COMPLETE 4+ VIEW COMPARISON:  None Available. FINDINGS: Frontal, bilateral oblique, lateral views of the lumbar spine are obtained. There are 5 non-rib-bearing lumbar type vertebral bodies in grossly normal anatomic alignment. No acute displaced fracture. Multilevel lumbar spondylosis and facet hypertrophy greatest at L4-5 and L5-S1. Sacroiliac joints are normal. IMPRESSION: 1. Lower lumbar spondylosis and facet hypertrophy. No acute fracture. Electronically Signed   By: Sharlet Salina M.D.   On: 12/23/2022 20:40   DG Thoracic Spine 2 View  Result Date: 12/23/2022 CLINICAL DATA:  Weakness, fell EXAM: THORACIC SPINE 2 VIEWS COMPARISON:  None Available. FINDINGS: Frontal and lateral views of the thoracic spine are obtained. Alignment is anatomic. No acute fractures. Mild lower thoracic spondylosis. Paraspinal soft tissues are unremarkable. IMPRESSION: 1. No acute bony abnormality. Electronically Signed   By: Sharlet Salina M.D.   On: 12/23/2022 20:40   DG Tibia/Fibula Left  Result Date: 12/23/2022 CLINICAL DATA:   Weakness, fell, inability to bear weight EXAM: LEFT TIBIA AND FIBULA - 2 VIEW COMPARISON:  None Available. FINDINGS: Frontal and lateral views of the left tibia and fibula are obtained. Evidence of prior healed distal left tibial diaphyseal fracture. No acute fracture, subluxation, or dislocation. Moderate 3 compartmental left knee osteoarthritis. Left ankle is unremarkable. Soft tissues are normal. IMPRESSION: 1. No acute displaced fracture. 2. Moderate left knee osteoarthritis. Electronically Signed   By: Sharlet Salina M.D.   On: 12/23/2022 20:39   DG Pelvis 1-2 Views  Result Date: 12/23/2022 CLINICAL DATA:  Weakness, fell, inability to bear weight on left side EXAM: PELVIS - 1-2 VIEW COMPARISON:  None Available. FINDINGS: Supine frontal view of the pelvis was obtained. No evidence of fracture, subluxation, or dislocation within either hip. Mild symmetrical bilateral hip osteoarthritis. The remainder of the bony pelvis is unremarkable. Sacroiliac joints are normal. IMPRESSION: 1. No acute displaced fracture. 2. Symmetrical bilateral hip osteoarthritis. Electronically Signed   By: Sharlet Salina M.D.   On: 12/23/2022 20:38     Reassessment and Plan:   Patient is a 88 year old gentleman s/p lumbar laminectomy, presenting with fatigue. Patient denies any other systemic symptoms or red flag symptoms for post-surgical complications. Patient was hypotensive on arrival to the ED, which normalized with fluid infusion.   Patient was signed out to Dr Jearld Fenton at the end of shift.           Final Clinical Impression(s) / ED Diagnoses Final diagnoses:  Weakness  Idiopathic hypotension    Rx / DC Orders ED Discharge Orders     None  Monna Fam, MD 01/17/23 1610    Tegeler, Canary Brim, MD 01/18/23 1319

## 2023-01-17 NOTE — ED Provider Notes (Signed)
3:39 PM Assumed care of patient from off-going team. For more details, please see note from same day.  In brief, this is a 88 y.o. male with back surgery a few weeks ago. P/w hypotension, weakness.  Hgb 8.5, similar to baseline 9. No focal back tenderness, no signs of infection on surgical site, he states not worse than before.  Plan/Dispo at time of sign-out & ED Course since sign-out: [ ]  UTI, CXR, more fluids  BP 131/68   Pulse (!) 58   Temp 98.9 F (37.2 C) (Oral)   Resp (!) 7   SpO2 100%    ED Course:   Clinical Course as of 01/17/23 2016  Wed Jan 17, 2023  1741 Urinalysis, Routine w reflex microscopic -Urine, Clean Catch No UTI [HN]  2013 Patient reevaluated.  He states he feels well.  He again denies any symptoms of infection.  His blood pressure has been stable for the last several hours and I encourage patient to stay well-hydrated at home and follow-up with his PCP or postop appointment in the next 1 to 2 weeks.  Blood cultures are drawn and are pending.  Patient reports understanding.  Given discharge instructions and return precautions, all questions answered to patient satisfaction. [HN]    Clinical Course User Index [HN] Loetta Rough, MD   ------------------------------- Vivi Barrack, MD Emergency Medicine  This note was created using dictation software, which may contain spelling or grammatical errors.   Loetta Rough, MD 01/17/23 2016

## 2023-01-19 LAB — CULTURE, BLOOD (ROUTINE X 2): Culture: NO GROWTH

## 2023-01-20 LAB — CULTURE, BLOOD (ROUTINE X 2)

## 2023-02-26 ENCOUNTER — Other Ambulatory Visit: Payer: Self-pay

## 2023-02-26 ENCOUNTER — Inpatient Hospital Stay (HOSPITAL_COMMUNITY)
Admission: EM | Admit: 2023-02-26 | Discharge: 2023-03-02 | DRG: 378 | Disposition: A | Discharge status: Home / Self Care | Payer: Medicare PPO | Attending: Internal Medicine | Admitting: Internal Medicine

## 2023-02-26 ENCOUNTER — Encounter (HOSPITAL_COMMUNITY): Payer: Self-pay | Admitting: Emergency Medicine

## 2023-02-26 DIAGNOSIS — Z8673 Personal history of transient ischemic attack (TIA), and cerebral infarction without residual deficits: Secondary | ICD-10-CM | POA: Diagnosis not present

## 2023-02-26 DIAGNOSIS — G8929 Other chronic pain: Secondary | ICD-10-CM | POA: Diagnosis present

## 2023-02-26 DIAGNOSIS — E669 Obesity, unspecified: Secondary | ICD-10-CM | POA: Diagnosis present

## 2023-02-26 DIAGNOSIS — Z7901 Long term (current) use of anticoagulants: Secondary | ICD-10-CM

## 2023-02-26 DIAGNOSIS — Z7902 Long term (current) use of antithrombotics/antiplatelets: Secondary | ICD-10-CM

## 2023-02-26 DIAGNOSIS — I482 Chronic atrial fibrillation, unspecified: Secondary | ICD-10-CM

## 2023-02-26 DIAGNOSIS — Z8249 Family history of ischemic heart disease and other diseases of the circulatory system: Secondary | ICD-10-CM | POA: Diagnosis not present

## 2023-02-26 DIAGNOSIS — M7989 Other specified soft tissue disorders: Secondary | ICD-10-CM | POA: Diagnosis present

## 2023-02-26 DIAGNOSIS — I160 Hypertensive urgency: Secondary | ICD-10-CM | POA: Diagnosis present

## 2023-02-26 DIAGNOSIS — D6832 Hemorrhagic disorder due to extrinsic circulating anticoagulants: Secondary | ICD-10-CM | POA: Diagnosis present

## 2023-02-26 DIAGNOSIS — Z91013 Allergy to seafood: Secondary | ICD-10-CM

## 2023-02-26 DIAGNOSIS — I2510 Atherosclerotic heart disease of native coronary artery without angina pectoris: Secondary | ICD-10-CM | POA: Diagnosis present

## 2023-02-26 DIAGNOSIS — Z87891 Personal history of nicotine dependence: Secondary | ICD-10-CM | POA: Diagnosis not present

## 2023-02-26 DIAGNOSIS — K922 Gastrointestinal hemorrhage, unspecified: Secondary | ICD-10-CM

## 2023-02-26 DIAGNOSIS — M79606 Pain in leg, unspecified: Secondary | ICD-10-CM | POA: Diagnosis present

## 2023-02-26 DIAGNOSIS — T45515A Adverse effect of anticoagulants, initial encounter: Secondary | ICD-10-CM | POA: Diagnosis present

## 2023-02-26 DIAGNOSIS — I959 Hypotension, unspecified: Secondary | ICD-10-CM | POA: Diagnosis present

## 2023-02-26 DIAGNOSIS — D509 Iron deficiency anemia, unspecified: Secondary | ICD-10-CM | POA: Diagnosis present

## 2023-02-26 DIAGNOSIS — T45525A Adverse effect of antithrombotic drugs, initial encounter: Secondary | ICD-10-CM | POA: Diagnosis present

## 2023-02-26 DIAGNOSIS — K31811 Angiodysplasia of stomach and duodenum with bleeding: Secondary | ICD-10-CM | POA: Diagnosis present

## 2023-02-26 DIAGNOSIS — E785 Hyperlipidemia, unspecified: Secondary | ICD-10-CM | POA: Diagnosis present

## 2023-02-26 DIAGNOSIS — G9520 Unspecified cord compression: Secondary | ICD-10-CM | POA: Diagnosis present

## 2023-02-26 DIAGNOSIS — K921 Melena: Secondary | ICD-10-CM

## 2023-02-26 DIAGNOSIS — Z6836 Body mass index (BMI) 36.0-36.9, adult: Secondary | ICD-10-CM | POA: Diagnosis not present

## 2023-02-26 DIAGNOSIS — G629 Polyneuropathy, unspecified: Secondary | ICD-10-CM | POA: Diagnosis present

## 2023-02-26 DIAGNOSIS — K31819 Angiodysplasia of stomach and duodenum without bleeding: Secondary | ICD-10-CM | POA: Diagnosis not present

## 2023-02-26 DIAGNOSIS — Z883 Allergy status to other anti-infective agents status: Secondary | ICD-10-CM

## 2023-02-26 DIAGNOSIS — Z79899 Other long term (current) drug therapy: Secondary | ICD-10-CM

## 2023-02-26 DIAGNOSIS — I252 Old myocardial infarction: Secondary | ICD-10-CM | POA: Diagnosis not present

## 2023-02-26 DIAGNOSIS — D649 Anemia, unspecified: Secondary | ICD-10-CM | POA: Diagnosis present

## 2023-02-26 DIAGNOSIS — D62 Acute posthemorrhagic anemia: Secondary | ICD-10-CM | POA: Diagnosis present

## 2023-02-26 DIAGNOSIS — I10 Essential (primary) hypertension: Secondary | ICD-10-CM | POA: Diagnosis present

## 2023-02-26 DIAGNOSIS — K254 Chronic or unspecified gastric ulcer with hemorrhage: Secondary | ICD-10-CM | POA: Diagnosis present

## 2023-02-26 DIAGNOSIS — R55 Syncope and collapse: Principal | ICD-10-CM

## 2023-02-26 DIAGNOSIS — Z791 Long term (current) use of non-steroidal anti-inflammatories (NSAID): Secondary | ICD-10-CM

## 2023-02-26 LAB — CBC WITH DIFFERENTIAL/PLATELET
Abs Immature Granulocytes: 0.02 10*3/uL (ref 0.00–0.07)
Basophils Absolute: 0 10*3/uL (ref 0.0–0.1)
Basophils Relative: 0 %
Eosinophils Absolute: 0.2 10*3/uL (ref 0.0–0.5)
Eosinophils Relative: 5 %
HCT: 21.7 % — ABNORMAL LOW (ref 39.0–52.0)
Hemoglobin: 6.4 g/dL — CL (ref 13.0–17.0)
Immature Granulocytes: 0 %
Lymphocytes Relative: 21 %
Lymphs Abs: 1 10*3/uL (ref 0.7–4.0)
MCH: 23 pg — ABNORMAL LOW (ref 26.0–34.0)
MCHC: 29.5 g/dL — ABNORMAL LOW (ref 30.0–36.0)
MCV: 78.1 fL — ABNORMAL LOW (ref 80.0–100.0)
Monocytes Absolute: 0.4 10*3/uL (ref 0.1–1.0)
Monocytes Relative: 9 %
Neutro Abs: 2.9 10*3/uL (ref 1.7–7.7)
Neutrophils Relative %: 65 %
Platelets: 302 10*3/uL (ref 150–400)
RBC: 2.78 MIL/uL — ABNORMAL LOW (ref 4.22–5.81)
RDW: 20.2 % — ABNORMAL HIGH (ref 11.5–15.5)
WBC: 4.5 10*3/uL (ref 4.0–10.5)
nRBC: 0.7 % — ABNORMAL HIGH (ref 0.0–0.2)

## 2023-02-26 LAB — BASIC METABOLIC PANEL
Anion gap: 10 (ref 5–15)
BUN: 10 mg/dL (ref 8–23)
CO2: 23 mmol/L (ref 22–32)
Calcium: 8.7 mg/dL — ABNORMAL LOW (ref 8.9–10.3)
Chloride: 102 mmol/L (ref 98–111)
Creatinine, Ser: 1.07 mg/dL (ref 0.61–1.24)
GFR, Estimated: >60 mL/min (ref >60)
Glucose, Bld: 149 mg/dL — ABNORMAL HIGH (ref 70–99)
Potassium: 3.9 mmol/L (ref 3.5–5.1)
Sodium: 135 mmol/L (ref 135–145)

## 2023-02-26 LAB — URINALYSIS, ROUTINE W REFLEX MICROSCOPIC
Bilirubin Urine: NEGATIVE
Glucose, UA: NEGATIVE mg/dL
Hgb urine dipstick: NEGATIVE
Ketones, ur: NEGATIVE mg/dL
Leukocytes,Ua: NEGATIVE
Nitrite: NEGATIVE
Protein, ur: NEGATIVE mg/dL
Specific Gravity, Urine: 1.004 — ABNORMAL LOW (ref 1.005–1.030)
pH: 6 (ref 5.0–8.0)

## 2023-02-26 LAB — HEMOGLOBIN AND HEMATOCRIT, BLOOD
HCT: 20.2 % — ABNORMAL LOW (ref 39.0–52.0)
Hemoglobin: 6 g/dL — CL (ref 13.0–17.0)

## 2023-02-26 LAB — PREPARE RBC (CROSSMATCH)

## 2023-02-26 LAB — POC OCCULT BLOOD, ED: Fecal Occult Bld: POSITIVE — AB

## 2023-02-26 MED ORDER — LACTATED RINGERS IV SOLN: INTRAVENOUS | Status: DC

## 2023-02-26 MED ORDER — ONDANSETRON HCL 4 MG/2ML IJ SOLN
4.0000 mg | Freq: Four times a day (QID) | INTRAMUSCULAR | Status: DC | PRN
  Administered 2023-03-01: 4 mg via INTRAVENOUS
  Filled 2023-02-26: qty 2

## 2023-02-26 MED ORDER — OXYCODONE HCL 5 MG PO TABS
5.0000 mg | ORAL_TABLET | Freq: Once | ORAL | Status: AC
  Administered 2023-02-26: 5 mg via ORAL
  Filled 2023-02-26: qty 1

## 2023-02-26 MED ORDER — ONDANSETRON HCL 4 MG PO TABS: 4.0000 mg | ORAL_TABLET | Freq: Four times a day (QID) | ORAL | Status: DC | PRN

## 2023-02-26 MED ORDER — ACETAMINOPHEN 325 MG PO TABS
650.0000 mg | ORAL_TABLET | Freq: Four times a day (QID) | ORAL | Status: DC | PRN
  Administered 2023-02-26 – 2023-03-01 (×5): 650 mg via ORAL
  Filled 2023-02-26 (×5): qty 2

## 2023-02-26 MED ORDER — ACETAMINOPHEN 650 MG RE SUPP: 650.0000 mg | Freq: Four times a day (QID) | RECTAL | Status: DC | PRN

## 2023-02-26 MED ORDER — LACTATED RINGERS IV BOLUS
1000.0000 mL | Freq: Once | INTRAVENOUS | Status: AC
  Administered 2023-02-26: 1000 mL via INTRAVENOUS

## 2023-02-26 MED ORDER — SODIUM CHLORIDE 0.9% IV SOLUTION: Freq: Once | INTRAVENOUS | Status: AC

## 2023-02-26 MED ORDER — PANTOPRAZOLE SODIUM 40 MG IV SOLR
40.0000 mg | Freq: Two times a day (BID) | INTRAVENOUS | Status: DC
  Administered 2023-02-26 – 2023-03-02 (×8): 40 mg via INTRAVENOUS
  Filled 2023-02-26 (×8): qty 10

## 2023-02-26 NOTE — ED Triage Notes (Signed)
Pt here form home with c/o syncopal episode while sitting in the chair , pt awake and alert on arrival , cbg 124

## 2023-02-26 NOTE — ED Notes (Signed)
ED TO INPATIENT HANDOFF REPORT  ED Nurse Name and Phone #:  Leatrice Jewels [phone]  S Name/Age/Gender Tristan Gardner 88 y.o. male Room/Bed: WA25/WA25  Code Status   Code Status: Full Code  Home/SNF/Other Home Patient oriented to: self, place, time, and situation Is this baseline? Yes   Triage Complete: Triage complete  Chief Complaint Symptomatic anemia [D64.9]  Triage Note Pt here form home with c/o syncopal episode while sitting in the chair , pt awake and alert on arrival , cbg 124    Allergies Allergies  Allergen Reactions   Iodine Hives, Itching and Rash         Shellfish Allergy Hives    Level of Care/Admitting Diagnosis ED Disposition     ED Disposition  Admit   Condition  --   Comment  Hospital Area: [facility] [[id number]]  Level of Care: Telemetry [5]  Admit to tele based on following criteria: Eval of Syncope  May admit patient to [facility] or [facility] if equivalent level of care is available:: Yes  Covid Evaluation: Asymptomatic - no recent exposure (last 10 days) testing not required  Diagnosis: Symptomatic anemia [3086578]  Admitting Physician: [name] [[id number]]  Attending Physician: [name] [[id number]]  Certification:: I certify this patient will need inpatient services for at least 2 midnights  Expected Medical Readiness: 02/28/2023          B Medical/Surgery History Past Medical History:  Diagnosis Date   Atrial fibrillation (HCC)    Coronary artery disease    Hypertension    NSTEMI (non-ST elevated myocardial infarction) (HCC)    Stroke [facility])    Past Surgical History:  Procedure Laterality Date   BIOPSY  10/23/2022   Procedure: BIOPSY;  Surgeon: [name], MD;  Location: [facility] ENDOSCOPY;  Service: Gastroenterology;;   CARDIAC SURGERY     COLONOSCOPY WITH PROPOFOL N/A 10/23/2022   Procedure: COLONOSCOPY WITH PROPOFOL;  Surgeon: [name], MD;  Location: [facility] ENDOSCOPY;  Service:  Gastroenterology;  Laterality: N/A;   ESOPHAGOGASTRODUODENOSCOPY (EGD) WITH PROPOFOL N/A 10/23/2022   Procedure: ESOPHAGOGASTRODUODENOSCOPY (EGD) WITH PROPOFOL;  Surgeon: [name], MD;  Location: [facility] ENDOSCOPY;  Service: Gastroenterology;  Laterality: N/A;   IR INJECT/THERA/INC NEEDLE/CATH/PLC EPI/LUMB/SAC W/IMG  12/27/2022   LUMBAR LAMINECTOMY/DECOMPRESSION MICRODISCECTOMY N/A 01/03/2023   Procedure: Lumbar Two-Three, Lumbar Three-Four, Lumbar Four-Five Open laminectomies;  Surgeon: [name], MD;  Location: [facility] OR;  Service: Neurosurgery;  Laterality: N/A;   RADIOLOGY WITH ANESTHESIA N/A 12/27/2022   Procedure: TOTAL SPINE MYELOGRAM;  Surgeon: Radiologist, Medication, MD;  Location: [facility] OR;  Service: Radiology;  Laterality: N/A;   ROTATOR CUFF REPAIR       A IV Location/Drains/Wounds Patient Lines/Drains/Airways Status     Active Line/Drains/Airways     Name Placement date Placement time Site Days   Peripheral IV 02/26/23 20 G Right Antecubital 02/26/23  1743  Antecubital  less than 1            Intake/Output Last 24 hours No intake or output data in the 24 hours ending 02/26/23 2024  Labs/Imaging Results for orders placed or performed during the hospital encounter of 02/26/23 (from the past 48 hour(s))  CBC with Differential/Platelet     Status: Abnormal   Collection Time: 02/26/23  5:43 PM  Result Value Ref Range   WBC 4.5 4.0 - 10.5 K/uL   RBC 2.78 (L) 4.22 - 5.81 MIL/uL   Hemoglobin 6.4 (LL) 13.0 - 17.0 g/dL    Comment: This critical  result has verified and been called to [facility] DOSS,RN by Pattricia Boss on 08 26 2024 at 1832, and has been read back.    HCT 21.7 (L) 39.0 - 52.0 %   MCV 78.1 (L) 80.0 - 100.0 fL   MCH 23.0 (L) 26.0 - 34.0 pg   MCHC 29.5 (L) 30.0 - 36.0 g/dL   RDW 16.1 (H) 09.6 - 04.5 %   Platelets 302 150 - 400 K/uL   nRBC 0.7 (H) 0.0 - 0.2 %   Neutrophils Relative % 65 %   Neutro Abs 2.9 1.7 - 7.7 K/uL   Lymphocytes Relative 21 %    Lymphs Abs 1.0 0.7 - 4.0 K/uL   Monocytes Relative 9 %   Monocytes Absolute 0.4 0.1 - 1.0 K/uL   Eosinophils Relative 5 %   Eosinophils Absolute 0.2 0.0 - 0.5 K/uL   Basophils Relative 0 %   Basophils Absolute 0.0 0.0 - 0.1 K/uL   Immature Granulocytes 0 %   Abs Immature Granulocytes 0.02 0.00 - 0.07 K/uL    Comment: Performed at [facility], [address], [place], [place] [address]  Basic metabolic panel     Status: Abnormal   Collection Time: 02/26/23  5:43 PM  Result Value Ref Range   Sodium 135 135 - 145 mmol/L   Potassium 3.9 3.5 - 5.1 mmol/L   Chloride 102 98 - 111 mmol/L   CO2 23 22 - 32 mmol/L   Glucose, Bld 149 (H) 70 - 99 mg/dL    Comment: Glucose reference range applies only to samples taken after fasting for at least 8 hours.   BUN 10 8 - 23 mg/dL   Creatinine, Ser 1.91 0.61 - 1.24 mg/dL   Calcium 8.7 (L) 8.9 - 10.3 mg/dL   GFR, Estimated >47 >82 mL/min    Comment: (NOTE) Calculated using the CKD-EPI Creatinine Equation (2021)    Anion gap 10 5 - 15    Comment: Performed at [facility], [address], [place], [place] [address]  Type and screen [facility]     Status: None (Preliminary result)   Collection Time: 02/26/23  6:56 PM  Result Value Ref Range   ABO/RH(D) O POS    Antibody Screen NEG    Sample Expiration      03/01/2023,2359 Performed at [facility], [address], [place], [place] [address]    Unit Number [id number]    Blood Component Type RED CELLS,LR    Unit division 00    Status of Unit ALLOCATED    Transfusion Status OK TO TRANSFUSE    Crossmatch Result Compatible    Unit Number [id number]    Blood Component Type RED CELLS,LR    Unit division 00    Status of Unit ALLOCATED    Transfusion Status OK TO TRANSFUSE    Crossmatch Result Compatible   Prepare RBC (crossmatch)     Status: None   Collection Time: 02/26/23  6:56 PM  Result Value Ref Range    Order Confirmation      ORDER PROCESSED BY BLOOD BANK Performed at [facility], [address], [place], [place] [address]   POC occult blood, ED RN will collect     Status: Abnormal   Collection Time: 02/26/23  7:26 PM  Result Value Ref Range   Fecal Occult Bld POSITIVE (A) NEGATIVE   No results found.  Pending Labs [employer] (From admission, onward)  Start     Ordered   02/27/23 0500  Basic metabolic panel  Tomorrow morning,   R        02/26/23 2008   02/27/23 0500  Protime-INR  Tomorrow morning,   R        02/26/23 2008   02/26/23 2009  Hemoglobin and hematocrit, blood  Now then every 8 hours,   R      02/26/23 2008   02/26/23 1749  Urinalysis, Routine w reflex microscopic -Urine, Clean Catch  Once,   URGENT       Question:  Specimen Source  Answer:  Urine, Clean Catch   02/26/23 1748            Vitals/Pain Today's Vitals   02/26/23 1925 02/26/23 1930 02/26/23 1945 02/26/23 2000  BP: 105/70 115/63 108/66 119/71  Pulse: (!) 57 (!) 57 (!) 58 (!) 59  Resp: 19 18 13  (!) 22  Temp:      SpO2: 100% 100% 98% 96%  PainSc:        Isolation Precautions No active isolations  Medications Medications  lactated ringers infusion ( Intravenous New Bag/Given 02/26/23 1927)  0.9 %  sodium chloride infusion (Manually program via Guardrails IV Fluids) (has no administration in time range)  acetaminophen (TYLENOL) tablet 650 mg (has no administration in time range)    Or  acetaminophen (TYLENOL) suppository 650 mg (has no administration in time range)  ondansetron (ZOFRAN) tablet 4 mg (has no administration in time range)    Or  ondansetron (ZOFRAN) injection 4 mg (has no administration in time range)  pantoprazole (PROTONIX) injection 40 mg (has no administration in time range)  lactated ringers bolus 1,000 mL (1,000 mLs Intravenous New Bag/Given 02/26/23 1926)    Mobility walks with device     Focused Assessments     R Recommendations: See  Admitting Provider Note  Report given to:   Additional Notes:  Pt is hard of hearing

## 2023-02-26 NOTE — H&P (Addendum)
PCP:   [name], MD   Chief Complaint:  Syncope  HPI: This is a 88 year old male with past medical history of atrial fibrillation on chronic anticoagulation, CAD, history of CVA maintained on Plavix.  She additionally has HTN, HLD, CAD, history of GI bleed.  Patient recently admitted 4/16 to 4/23 with symptomatic anemia.  Hemoglobin 5.1.  Patient again admitted 6/22 to 7/6 with severe lumbar stenosis.  Laminectomy done.  Today patient at home sitting on the couch.  He laid back and started snoring.  At baseline patient does not snore.  Family attempted but were unsuccessful trying to wake him.  EMS was called.  Prior to EMS arrival patient regained consciousness.  He was not confused.  There is no evidence of seizure activity.  Patient denies lightheadedness, dizziness, fatigue, weakness, chest pains.  Patient has chronic right shoulder pain.  He takes ibuprofen every other or every third day as 2 tablets each time.  Patient's last endoscopy was/2 3.  EGD showed gastropathy and gastric erosions.  Nonbleeding ulcers with no stigmata of bleeding.  Colonoscopy with poor prep.  However, single nonbleeding colonic angiodysplastic lesion noted.  Follow-up colonoscopy and repeat EGD in 10 weeks recommended.  Negative endoscopies have been repeated.  Review of Systems:  Per HPI  Past Medical History: Past Medical History:  Diagnosis Date   Atrial fibrillation (HCC)    Coronary artery disease    Hypertension    NSTEMI (non-ST elevated myocardial infarction) (HCC)    Stroke [facility])    Past Surgical History:  Procedure Laterality Date   BIOPSY  10/23/2022   Procedure: BIOPSY;  Surgeon: [name], MD;  Location: [facility] ENDOSCOPY;  Service: Gastroenterology;;   CARDIAC SURGERY     COLONOSCOPY WITH PROPOFOL N/A 10/23/2022   Procedure: COLONOSCOPY WITH PROPOFOL;  Surgeon: [name], MD;  Location: [facility] ENDOSCOPY;  Service: Gastroenterology;  Laterality: N/A;   ESOPHAGOGASTRODUODENOSCOPY (EGD)  WITH PROPOFOL N/A 10/23/2022   Procedure: ESOPHAGOGASTRODUODENOSCOPY (EGD) WITH PROPOFOL;  Surgeon: [name], MD;  Location: [facility] ENDOSCOPY;  Service: Gastroenterology;  Laterality: N/A;   IR INJECT/THERA/INC NEEDLE/CATH/PLC EPI/LUMB/SAC W/IMG  12/27/2022   LUMBAR LAMINECTOMY/DECOMPRESSION MICRODISCECTOMY N/A 01/03/2023   Procedure: Lumbar Two-Three, Lumbar Three-Four, Lumbar Four-Five Open laminectomies;  Surgeon: [name], MD;  Location: [facility] OR;  Service: Neurosurgery;  Laterality: N/A;   RADIOLOGY WITH ANESTHESIA N/A 12/27/2022   Procedure: TOTAL SPINE MYELOGRAM;  Surgeon: Radiologist, Medication, MD;  Location: [facility] OR;  Service: Radiology;  Laterality: N/A;   ROTATOR CUFF REPAIR      Medications: Prior to Admission medications   Medication Sig Start Date End Date Taking? Authorizing Provider  acetaminophen (TYLENOL) 325 MG tablet Take 2 tablets (650 mg total) by mouth every 6 (six) hours as needed for mild pain (or Fever >/= 101). 10/24/22   Elgergawy, [name], MD  albuterol (VENTOLIN HFA) 108 (90 Base) MCG/ACT inhaler Inhale 2 puffs into the lungs every 4 (four) hours as needed for wheezing or shortness of breath. 02/24/20   [provider]  atorvastatin (LIPITOR) 40 MG tablet Take 40 mg by mouth at bedtime. 08/24/22   [provider]  baclofen (LIORESAL) 10 MG tablet Take 10 mg by mouth 3 (three) times daily.    [provider]  benzocaine (ORAJEL) 10 % mucosal gel Use as directed in the mouth or throat 4 (four) times daily as needed for mouth pain. 01/06/23   Ghimire, Werner Lean, MD  clopidogrel (PLAVIX) 75 MG tablet Take 1 tablet (75  mg total) by mouth daily. START ON 7/10 01/10/23   Ghimire, Werner Lean, MD  DULoxetine (CYMBALTA) 30 MG capsule Take 30 mg by mouth 2 (two) times daily.    [provider]  Ferrous Sulfate (IRON PO) Take 1 tablet by mouth daily.    [provider]  gabapentin (NEURONTIN) 300 MG capsule Take 300 mg by mouth 3  (three) times daily.    [provider]  hydrALAZINE (APRESOLINE) 50 MG tablet Take 1 tablet (50 mg total) by mouth every 8 (eight) hours. 01/06/23   Ghimire, Werner Lean, MD  isosorbide mononitrate (IMDUR) 30 MG 24 hr tablet Take 30 mg by mouth daily. 08/24/22   [provider]  losartan (COZAAR) 25 MG tablet Take 1 tablet (25 mg total) by mouth daily. 10/26/22   Elgergawy, [name], MD  metoprolol succinate (TOPROL-XL) 50 MG 24 hr tablet Take 50 mg by mouth daily. 08/24/22   [provider]  oxyCODONE (OXY IR/ROXICODONE) 5 MG immediate release tablet Take 1 tablet (5 mg total) by mouth every 6 (six) hours as needed for moderate pain or severe pain. 01/06/23   Ghimire, Werner Lean, MD  pantoprazole (PROTONIX) 40 MG tablet Take 1 tablet (40 mg total) by mouth 2 (two) times daily before a meal. Patient not taking: Reported on 12/24/2022 10/24/22 12/23/22  Elgergawy, [name], MD  traZODone (DESYREL) 50 MG tablet Take 50 mg by mouth at bedtime.    [provider]  XARELTO 20 MG TABS tablet Take 1 tablet (20 mg total) by mouth every morning. START ON 7/10 01/10/23   Ghimire, Werner Lean, MD    Allergies:   Allergies  Allergen Reactions   Iodine Hives, Itching and Rash         Shellfish Allergy Hives    Social History:  reports that he has quit smoking. He has never used smokeless tobacco. He reports that he does not drink alcohol and does not use drugs.  Family History: Family History  Problem Relation Age of Onset   Diabetes Mother    Heart attack Father 15   Hypertension Brother    Heart disease Brother    Diabetes Brother     Physical Exam: Vitals:   02/26/23 1925 02/26/23 1930 02/26/23 1945 02/26/23 2000  BP: 105/70 115/63 108/66 119/71  Pulse: (!) 57 (!) 57 (!) 58 (!) 59  Resp: 19 18 13  (!) 22  Temp:      SpO2: 100% 100% 98% 96%    General: A and O x 3, well developed and nourished, no acute distress Eyes: Pale conjunctiva, no scleral icterus ENT:  Moist oral mucosa, neck supple, no thyromegaly Lungs: clear to ascultation, no wheeze, no crackles, no use of accessory muscles Cardiovascular: regular rate and rhythm, no regurgitation, no gallops, no murmurs. No carotid bruits, no JVD Abdomen: soft, positive BS, non-tender, non-distended, no organomegaly, not an acute abdomen GU: not examined Neuro: CN II - XII grossly intact, sensation intact Musculoskeletal: strength 4/5 all extremities, RLE jerks. RLE larger LLE Skin: no rash, no subcutaneous crepitation, no decubitus Psych: appropriate patient  Labs on Admission:  Recent Labs    02/26/23 1743  NA 135  K 3.9  CL 102  CO2 23  GLUCOSE 149*  BUN 10  CREATININE 1.07  CALCIUM 8.7*    Recent Labs    02/26/23 1743  WBC 4.5  NEUTROABS 2.9  HGB 6.4*  HCT 21.7*  MCV 78.1*  PLT 302  Radiological Exams on Admission: No results found.  Assessment/Plan Present on Admission:  Symptomatic anemia //  GI bleed -2 units PRBC ordered by EDP.  Posttransfusion H&H -N.p.o., IV fluid hydration -Protonix 40 mg IV every 12 -Serial H&H every 8 hours -[facility] to see patient in a.m. -Patient counseled on harm of NSAIDs. -Patient is on both Xarelto and Plavix.  These have been held   Syncope and collapse -Likely due to above, monitor   Chronic anticoagulation //  atrial fibrillation //  CAD -Xarelto on hold -Blood pressure medication hydralazine, Imdur, metoprolol will be resumed and titrated accordingly to blood BP tolerance   Essential hypertension -BP meds resumed, titrate blood pressure,   Dyslipidemia -Lipitor on hold   History of CVA -Plavix on hold  RLE larger than left -Doppler ordered   Chronic leg pain with involuntary spasms //  Peripheral neuropathy -Currently on gabapentin baclofen trazodone and Cymbalta.  Continued -Per patient present since last admission.  Likely related to lumbar cord compression sp laminectomy.  PT outpatient    Heavenlee Maiorana,  Grizel Vesely 02/26/2023, 8:12 PM

## 2023-02-26 NOTE — ED Provider Notes (Signed)
[place] EMERGENCY DEPARTMENT AT [facility] Provider Note   CSN: [id number] Arrival date & time: 02/26/23  1727     History  Chief Complaint  Patient presents with   Loss of Consciousness    Tristan Gardner is a 88 y.o. male.  88 year old patient presents after having syncopal event while sitting in a chair today.  Patient denies being lightheaded or dizzy prior to the event.  States he has had some weakness.  Denies any recent history of volume loss.  No black or bloody stools.  No abdominal chest pain.  Is not been short of breath.  No headaches noted.  No seizure activity reported.  Patient does have history of hypertension but was found to be hypotensive here.       Home Medications Prior to Admission medications   Medication Sig Start Date End Date Taking? Authorizing Provider  acetaminophen (TYLENOL) 325 MG tablet Take 2 tablets (650 mg total) by mouth every 6 (six) hours as needed for mild pain (or Fever >/= 101). 10/24/22   Elgergawy, [name], MD  albuterol (VENTOLIN HFA) 108 (90 Base) MCG/ACT inhaler Inhale 2 puffs into the lungs every 4 (four) hours as needed for wheezing or shortness of breath. 02/24/20   [provider]  atorvastatin (LIPITOR) 40 MG tablet Take 40 mg by mouth at bedtime. 08/24/22   [provider]  baclofen (LIORESAL) 10 MG tablet Take 10 mg by mouth 3 (three) times daily.    [provider]  benzocaine (ORAJEL) 10 % mucosal gel Use as directed in the mouth or throat 4 (four) times daily as needed for mouth pain. 01/06/23   Ghimire, Werner Lean, MD  clopidogrel (PLAVIX) 75 MG tablet Take 1 tablet (75 mg total) by mouth daily. START ON 7/10 01/10/23   Ghimire, Werner Lean, MD  DULoxetine (CYMBALTA) 30 MG capsule Take 30 mg by mouth 2 (two) times daily.    [provider]  Ferrous Sulfate (IRON PO) Take 1 tablet by mouth daily.    [provider]  gabapentin (NEURONTIN) 300 MG capsule Take 300 mg by mouth 3  (three) times daily.    [provider]  hydrALAZINE (APRESOLINE) 50 MG tablet Take 1 tablet (50 mg total) by mouth every 8 (eight) hours. 01/06/23   Ghimire, Werner Lean, MD  isosorbide mononitrate (IMDUR) 30 MG 24 hr tablet Take 30 mg by mouth daily. 08/24/22   [provider]  losartan (COZAAR) 25 MG tablet Take 1 tablet (25 mg total) by mouth daily. 10/26/22   Elgergawy, [name], MD  metoprolol succinate (TOPROL-XL) 50 MG 24 hr tablet Take 50 mg by mouth daily. 08/24/22   [provider]  oxyCODONE (OXY IR/ROXICODONE) 5 MG immediate release tablet Take 1 tablet (5 mg total) by mouth every 6 (six) hours as needed for moderate pain or severe pain. 01/06/23   Ghimire, Werner Lean, MD  pantoprazole (PROTONIX) 40 MG tablet Take 1 tablet (40 mg total) by mouth 2 (two) times daily before a meal. Patient not taking: Reported on 12/24/2022 10/24/22 12/23/22  Elgergawy, [name], MD  traZODone (DESYREL) 50 MG tablet Take 50 mg by mouth at bedtime.    [provider]  XARELTO 20 MG TABS tablet Take 1 tablet (20 mg total) by mouth every morning. START ON 7/10 01/10/23   Ghimire, Werner Lean, MD      Allergies    Iodine and Shellfish allergy    Review of Systems  Review of Systems  All other systems reviewed and are negative.   Physical Exam Updated Vital Signs BP (!) 97/55   Pulse (!) 57   Temp 98.2 F (36.8 C)   Resp (!) 21   SpO2 92%  Physical Exam Vitals and nursing note reviewed.  Constitutional:      General: He is not in acute distress.    Appearance: Normal appearance. He is well-developed. He is not toxic-appearing.  HENT:     Head: Normocephalic and atraumatic.  Eyes:     General: Lids are normal.     Conjunctiva/sclera: Conjunctivae normal.     Pupils: Pupils are equal, round, and reactive to light.  Neck:     Thyroid: No thyroid mass.     Trachea: No tracheal deviation.  Cardiovascular:     Rate and Rhythm: Normal rate and regular rhythm.     Heart  sounds: Normal heart sounds. No murmur heard.    No gallop.  Pulmonary:     Effort: Pulmonary effort is normal. No respiratory distress.     Breath sounds: Normal breath sounds. No stridor. No decreased breath sounds, wheezing, rhonchi or rales.  Abdominal:     General: There is no distension.     Palpations: Abdomen is soft.     Tenderness: There is no abdominal tenderness. There is no rebound.  Musculoskeletal:        General: No tenderness. Normal range of motion.     Cervical back: Normal range of motion and neck supple.  Skin:    General: Skin is warm and dry.     Findings: No abrasion or rash.  Neurological:     Mental Status: He is alert and oriented to person, place, and time. Mental status is at baseline.     GCS: GCS eye subscore is 4. GCS verbal subscore is 5. GCS motor subscore is 6.     Cranial Nerves: Cranial nerves are intact. No cranial nerve deficit.     Sensory: No sensory deficit.     Motor: Motor function is intact.  Psychiatric:        Attention and Perception: Attention normal.        Speech: Speech normal.        Behavior: Behavior normal.     ED Results / Procedures / Treatments   Labs (all labs ordered are listed, but only abnormal results are displayed) Labs Reviewed  CBC WITH DIFFERENTIAL/PLATELET  BASIC METABOLIC PANEL  URINALYSIS, ROUTINE W REFLEX MICROSCOPIC    EKG EKG Interpretation Date/Time:  Monday February 26 2023 17:36:25 EDT Ventricular Rate:  58 PR Interval:  197 QRS Duration:  111 QT Interval:  421 QTC Calculation: 414 R Axis:   2  Text Interpretation: Sinus or ectopic atrial rhythm Low voltage, precordial leads Abnormal R-wave progression, early transition Minimal ST depression, inferior leads No significant change since last tracing Confirmed by [name] ([id number]) on 02/26/2023 5:47:17 PM  Radiology No results found.  Procedures Procedures    Medications Ordered in ED Medications  lactated ringers bolus 1,000 mL (has  no administration in time range)  lactated ringers infusion (has no administration in time range)    ED Course/ Medical Decision Making/ A&P                                 Medical Decision Making Amount and/or Complexity of Data Reviewed Labs: ordered. ECG/medicine tests: ordered.  Risk Prescription drug management.   Patient is EKG per interpretation shows sinus rhythm.  He does have guaiac positive stools here.  Hemoglobin is 6.4.  2 units of packed red blood cells ordered.  Given IV fluids here.  Blood pressure has stabilized.  Secure chat message sent to Dr. Adela Lank, on-call for [facility], and will admit to the hospital service        Final Clinical Impression(s) / ED Diagnoses Final diagnoses:  None    Rx / DC Orders ED Discharge Orders     None         [name], MD 02/26/23 1950

## 2023-02-26 NOTE — ED Notes (Signed)
Date and time results received: 02/26/23 6:32 PM  (use smartphrase ".now" to insert current time)  Test: Hgb Critical Value: 6.4  Name of Provider Notified: Dr. Freida Busman  Orders Received? Or Actions Taken?:

## 2023-02-27 ENCOUNTER — Encounter (HOSPITAL_COMMUNITY)
Admission: EM | Disposition: A | Discharge status: Home / Self Care | Payer: Self-pay | Source: Home / Self Care | Attending: Internal Medicine

## 2023-02-27 ENCOUNTER — Encounter (HOSPITAL_COMMUNITY): Payer: Self-pay | Admitting: Family Medicine

## 2023-02-27 ENCOUNTER — Inpatient Hospital Stay (HOSPITAL_COMMUNITY): Payer: Medicare PPO

## 2023-02-27 ENCOUNTER — Inpatient Hospital Stay (HOSPITAL_COMMUNITY): Payer: Medicare PPO | Admitting: Certified Registered Nurse Anesthetist

## 2023-02-27 DIAGNOSIS — Z87891 Personal history of nicotine dependence: Secondary | ICD-10-CM | POA: Diagnosis not present

## 2023-02-27 DIAGNOSIS — K31819 Angiodysplasia of stomach and duodenum without bleeding: Secondary | ICD-10-CM | POA: Diagnosis not present

## 2023-02-27 DIAGNOSIS — I10 Essential (primary) hypertension: Secondary | ICD-10-CM

## 2023-02-27 DIAGNOSIS — M7989 Other specified soft tissue disorders: Secondary | ICD-10-CM

## 2023-02-27 DIAGNOSIS — D649 Anemia, unspecified: Secondary | ICD-10-CM | POA: Diagnosis not present

## 2023-02-27 DIAGNOSIS — I482 Chronic atrial fibrillation, unspecified: Secondary | ICD-10-CM

## 2023-02-27 HISTORY — PX: ENTEROSCOPY: SHX5533

## 2023-02-27 HISTORY — PX: HEMOSTASIS CLIP PLACEMENT: SHX6857

## 2023-02-27 LAB — BASIC METABOLIC PANEL
Anion gap: 11 (ref 5–15)
BUN: 9 mg/dL (ref 8–23)
CO2: 21 mmol/L — ABNORMAL LOW (ref 22–32)
Calcium: 8.6 mg/dL — ABNORMAL LOW (ref 8.9–10.3)
Chloride: 100 mmol/L (ref 98–111)
Creatinine, Ser: 0.92 mg/dL (ref 0.61–1.24)
GFR, Estimated: >60 mL/min (ref >60)
Glucose, Bld: 85 mg/dL (ref 70–99)
Potassium: 3.8 mmol/L (ref 3.5–5.1)
Sodium: 132 mmol/L — ABNORMAL LOW (ref 135–145)

## 2023-02-27 LAB — HEMOGLOBIN AND HEMATOCRIT, BLOOD
HCT: 26.6 % — ABNORMAL LOW (ref 39.0–52.0)
HCT: 24.2 % — ABNORMAL LOW (ref 39.0–52.0)
HCT: 23.6 % — ABNORMAL LOW (ref 39.0–52.0)
HCT: 24.9 % — ABNORMAL LOW (ref 39.0–52.0)
Hemoglobin: 7.8 g/dL — ABNORMAL LOW (ref 13.0–17.0)
Hemoglobin: 7.5 g/dL — ABNORMAL LOW (ref 13.0–17.0)
Hemoglobin: 7.4 g/dL — ABNORMAL LOW (ref 13.0–17.0)
Hemoglobin: 7.8 g/dL — ABNORMAL LOW (ref 13.0–17.0)

## 2023-02-27 LAB — IRON AND TIBC
Iron: 23 ug/dL — ABNORMAL LOW (ref 45–182)
Saturation Ratios: 6 % — ABNORMAL LOW (ref 17.9–39.5)
TIBC: 357 ug/dL (ref 250–450)
UIBC: 334 ug/dL

## 2023-02-27 LAB — VITAMIN B12: Vitamin B-12: 227 pg/mL (ref 180–914)

## 2023-02-27 LAB — PROTIME-INR
INR: 1.5 — ABNORMAL HIGH (ref 0.8–1.2)
Prothrombin Time: 17.9 s — ABNORMAL HIGH (ref 11.4–15.2)

## 2023-02-27 LAB — FERRITIN: Ferritin: 8 ng/mL — ABNORMAL LOW (ref 24–336)

## 2023-02-27 LAB — FOLATE: Folate: 5.6 ng/mL — ABNORMAL LOW (ref >5.9)

## 2023-02-27 SURGERY — ENTEROSCOPY
Anesthesia: Monitor Anesthesia Care

## 2023-02-27 MED ORDER — LIDOCAINE HCL (PF) 2 % IJ SOLN
INTRAMUSCULAR | Status: DC | PRN
Start: 2023-02-27 — End: 2023-02-27
  Administered 2023-02-27: 100 mg via INTRADERMAL

## 2023-02-27 MED ORDER — PROPOFOL 500 MG/50ML IV EMUL
INTRAVENOUS | Status: DC | PRN
  Administered 2023-02-27: 125 ug/kg/min via INTRAVENOUS

## 2023-02-27 MED ORDER — LACTATED RINGERS IV SOLN: INTRAVENOUS | Status: DC

## 2023-02-27 MED ORDER — PROPOFOL 10 MG/ML IV BOLUS
INTRAVENOUS | Status: DC | PRN
  Administered 2023-02-27: 40 mg via INTRAVENOUS

## 2023-02-27 MED ORDER — PHENYLEPHRINE 80 MCG/ML (10ML) SYRINGE FOR IV PUSH (FOR BLOOD PRESSURE SUPPORT)
PREFILLED_SYRINGE | INTRAVENOUS | Status: DC | PRN
  Administered 2023-02-27: 160 ug via INTRAVENOUS

## 2023-02-27 NOTE — Consult Note (Signed)
[name] Gastroenterology Consult  Referring Provider: No ref. provider found Primary Care Physician:  [name], MD Primary Gastroenterologist: [name]  Reason for Consultation: Anemia and fecal occult positive stool  SUBJECTIVE:   HPI: Tristan Gardner is a 88 y.o. male with past medical history significant for atrial fibrillation on Xarelto, coronary artery disease on Plavix and history of CVA.  Presented to hospital on 02/26/2023 with chief complaint of altered mental status.  Family noted that he had fallen asleep and possibly lost consciousness.  On presentation to emergency department hemoglobin 6.0, now 7.5 status post 2 units PRBC, baseline hemoglobin appears to be 8.5 on 01/17/2023, platelet 302, WBC 4.5, INR 1.5.  No abdominal imaging completed.  Patient noted that he last took Xarelto and Plavix 02/26/2023 in AM.  Patient currently denied any chest pain or shortness of breath.  No abdominal pain.  No nausea or vomiting.  Noted that he has been having melenic stools for the past 1 month.  No NSAID use.  EGD and colonoscopy completed 10/23/2022 (Dr. 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  Past Medical History:  Diagnosis Date   Atrial fibrillation [facility])    Coronary artery disease    Hypertension    NSTEMI (non-ST elevated myocardial infarction) (HCC)    Stroke [facility])    Past Surgical History:  Procedure Laterality Date   BIOPSY  10/23/2022   Procedure: BIOPSY;  Surgeon: [name], MD;  Location: [facility] ENDOSCOPY;  Service: Gastroenterology;;   CARDIAC SURGERY     COLONOSCOPY WITH PROPOFOL N/A 10/23/2022   Procedure: COLONOSCOPY WITH PROPOFOL;  Surgeon: [name], MD;  Location: [facility] ENDOSCOPY;  Service: Gastroenterology;  Laterality: N/A;   ESOPHAGOGASTRODUODENOSCOPY (EGD)  WITH PROPOFOL N/A 10/23/2022   Procedure: ESOPHAGOGASTRODUODENOSCOPY (EGD) WITH PROPOFOL;  Surgeon: [name], MD;  Location: [facility] ENDOSCOPY;  Service: Gastroenterology;  Laterality: N/A;   IR INJECT/THERA/INC NEEDLE/CATH/PLC EPI/LUMB/SAC W/IMG  12/27/2022   LUMBAR LAMINECTOMY/DECOMPRESSION MICRODISCECTOMY N/A 01/03/2023   Procedure: Lumbar Two-Three, Lumbar Three-Four, Lumbar Four-Five Open laminectomies;  Surgeon: [name], MD;  Location: [facility] OR;  Service: Neurosurgery;  Laterality: N/A;   RADIOLOGY WITH ANESTHESIA N/A 12/27/2022   Procedure: TOTAL SPINE MYELOGRAM;  Surgeon: Radiologist, Medication, MD;  Location: [facility] OR;  Service: Radiology;  Laterality: N/A;   ROTATOR CUFF REPAIR     Prior to Admission medications   Medication Sig Start Date End Date Taking? Authorizing Provider  acetaminophen (TYLENOL) 325 MG tablet Take 2 tablets (650 mg total) by mouth every 6 (six) hours as needed for mild pain (or Fever >/= 101). 10/24/22   Elgergawy, [name], MD  albuterol (VENTOLIN HFA) 108 (90 Base) MCG/ACT inhaler Inhale 2 puffs into the lungs every 4 (four) hours as needed for wheezing or shortness of breath. 02/24/20   [provider]  atorvastatin (LIPITOR) 40 MG tablet Take 40 mg by mouth at bedtime. 08/24/22   [provider]  baclofen (LIORESAL) 10 MG tablet Take 10 mg by mouth 3 (three) times daily.    [provider]  benzocaine (ORAJEL) 10 % mucosal gel Use as directed in the mouth or throat 4 (four) times daily as needed for mouth pain. 01/06/23   Ghimire, Werner Lean, MD  clopidogrel (PLAVIX) 75 MG tablet Take 1 tablet (75 mg total) by mouth daily. START ON 7/10 01/10/23  Ghimire, Werner Lean, MD  DULoxetine (CYMBALTA) 30 MG capsule Take 30 mg by mouth 2 (two) times daily.    [provider]  Ferrous Sulfate (IRON PO) Take 1 tablet by mouth daily.    [provider]  gabapentin (NEURONTIN) 300 MG capsule Take 300 mg by mouth 3 (three) times  daily.    [provider]  hydrALAZINE (APRESOLINE) 50 MG tablet Take 1 tablet (50 mg total) by mouth every 8 (eight) hours. 01/06/23   Ghimire, Werner Lean, MD  isosorbide mononitrate (IMDUR) 30 MG 24 hr tablet Take 30 mg by mouth daily. 08/24/22   [provider]  losartan (COZAAR) 25 MG tablet Take 1 tablet (25 mg total) by mouth daily. 10/26/22   Elgergawy, [name], MD  metoprolol succinate (TOPROL-XL) 50 MG 24 hr tablet Take 50 mg by mouth daily. 08/24/22   [provider]  oxyCODONE (OXY IR/ROXICODONE) 5 MG immediate release tablet Take 1 tablet (5 mg total) by mouth every 6 (six) hours as needed for moderate pain or severe pain. 01/06/23   Ghimire, Werner Lean, MD  pantoprazole (PROTONIX) 40 MG tablet Take 1 tablet (40 mg total) by mouth 2 (two) times daily before a meal. Patient not taking: Reported on 12/24/2022 10/24/22 12/23/22  Elgergawy, [name], MD  traZODone (DESYREL) 50 MG tablet Take 50 mg by mouth at bedtime.    [provider]  XARELTO 20 MG TABS tablet Take 1 tablet (20 mg total) by mouth every morning. START ON 7/10 01/10/23   Ghimire, Werner Lean, MD   Current Facility-Administered Medications  Medication Dose Route Frequency Provider Last Rate Last Admin   acetaminophen (TYLENOL) tablet 650 mg  650 mg Oral Q6H PRN [name], MD   650 mg at 02/26/23 2302   Or   acetaminophen (TYLENOL) suppository 650 mg  650 mg Rectal Q6H PRN [name], MD       lactated ringers infusion   Intravenous Continuous [name], MD   Stopped at 02/26/23 2026   ondansetron (ZOFRAN) tablet 4 mg  4 mg Oral Q6H PRN [name], MD       Or   ondansetron (ZOFRAN) injection 4 mg  4 mg Intravenous Q6H PRN Crosley, Debby, MD       pantoprazole (PROTONIX) injection 40 mg  40 mg Intravenous Q12H Crosley, Debby, MD   40 mg at 02/27/23 1150   Allergies as of 02/26/2023 - Review Complete 02/26/2023  Allergen Reaction Noted   Iodine Hives, Itching, and Rash 02/24/2011    Shellfish allergy Hives 05/17/2020   Family History  Problem Relation Age of Onset   Diabetes Mother    Heart attack Father 53   Hypertension Brother    Heart disease Brother    Diabetes Brother    Social History   Socioeconomic History   Marital status: Married    Spouse name: Not on file   Number of children: Not on file   Years of education: Not on file   Highest education level: Not on file  Occupational History   Not on file  Tobacco Use   Smoking status: Former   Smokeless tobacco: Never  Vaping Use   Vaping status: Never Used  Substance and Sexual Activity   Alcohol use: No   Drug use: Never   Sexual activity: Not Currently  Other Topics Concern   Not on file  Social History Narrative   Not on file   Social Determinants of Health  Financial Resource Strain: Low Risk  (02/13/2023)   Received from [facility]   Overall Financial Resource Strain (CARDIA)    Difficulty of Paying Living Expenses: Not very hard  Food Insecurity: No Food Insecurity (02/26/2023)   Hunger Vital Sign    Worried About Running Out of Food in the Last Year: Never true    Ran Out of Food in the Last Year: Never true  Transportation Needs: No Transportation Needs (02/26/2023)   PRAPARE - Administrator, Civil Service (Medical): No    Lack of Transportation (Non-Medical): No  Physical Activity: Insufficiently Active (02/24/2020)   Received from [facility], [facility]   Exercise Vital Sign    Days of Exercise per Week: 2 days    Minutes of Exercise per Session: 10 min  Stress: Stress Concern Present (02/24/2020)   Received from [facility], [facility] - Occupational Stress Questionnaire    Feeling of Stress : Very much  Social Connections: Unknown (11/03/2021)   Received from [facility], [facility]   Social Network    Social Network: Not on file  Intimate Partner Violence: Not At Risk (02/26/2023)   Humiliation,  Afraid, Rape, and Kick questionnaire    Fear of Current or Ex-Partner: No    Emotionally Abused: No    Physically Abused: No    Sexually Abused: No   Review of Systems:  Review of Systems  Respiratory:  Negative for shortness of breath.   Cardiovascular:  Negative for chest pain.  Gastrointestinal:  Positive for melena. Negative for abdominal pain, nausea and vomiting.    OBJECTIVE:   Temp:  [97.6 F (36.4 C)-98.3 F (36.8 C)] 98.3 F (36.8 C) (08/27 1020) Pulse Rate:  [57-90] 90 (08/27 1020) Resp:  [13-24] 16 (08/27 1020) BP: (97-141)/(55-92) 141/71 (08/27 1020) SpO2:  [92 %-100 %] 96 % (08/27 1020) Last BM Date : 02/26/23 Physical Exam Constitutional:      General: He is not in acute distress.    Appearance: He is not ill-appearing, toxic-appearing or diaphoretic.  Cardiovascular:     Rate and Rhythm: Normal rate. Rhythm irregular.  Pulmonary:     Effort: No respiratory distress.     Breath sounds: Normal breath sounds.  Abdominal:     General: Bowel sounds are normal. There is no distension.     Palpations: Abdomen is soft.     Tenderness: There is no abdominal tenderness. There is no guarding.  Musculoskeletal:     Right lower leg: No edema.     Left lower leg: No edema.  Skin:    General: Skin is warm and dry.  Neurological:     Mental Status: He is alert.     Labs: Recent Labs    02/26/23 1743 02/26/23 2033 02/27/23 0527 02/27/23 0632  WBC 4.5  --   --   --   HGB 6.4* 6.0* 7.8* 7.5*  HCT 21.7* 20.2* 26.6* 24.2*  PLT 302  --   --   --    BMET Recent Labs    02/26/23 1743 02/27/23 0527  NA 135 132*  K 3.9 3.8  CL 102 100  CO2 23 21*  GLUCOSE 149* 85  BUN 10 9  CREATININE 1.07 0.92  CALCIUM 8.7* 8.6*   LFT No results for input(s): "PROT", "ALBUMIN", "AST", "ALT", "ALKPHOS", "BILITOT", "BILIDIR", "IBILI" in the last 72 hours. PT/INR Recent Labs    02/27/23 0527  LABPROT 17.9*  INR  1.5*    Diagnostic imaging: VAS [place] LOWER EXTREMITY  VENOUS (DVT)  Result Date: 02/27/2023  Lower Venous DVT Study Patient Name:  APRIL TOMAC  Date of Exam:   02/27/2023 Medical Rec #: [id number]      Accession #:    [id number] Date of Birth: 1934/11/27     Patient Gender: M Patient Age:   55 years Exam Location:  [facility] Procedure:      VAS [place] LOWER EXTREMITY VENOUS (DVT) Referring Phys: [name] --------------------------------------------------------------------------------  Indications: Swelling.  Comparison Study: No previous exams Performing Technologist: Jody Hill RVT, RDMS  Examination Guidelines: A complete evaluation includes B-mode imaging, spectral Doppler, color Doppler, and power Doppler as needed of all accessible portions of each vessel. Bilateral testing is considered an integral part of a complete examination. Limited examinations for reoccurring indications may be performed as noted. The reflux portion of the exam is performed with the patient in reverse Trendelenburg.  +---------+---------------+---------+-----------+----------+-------------------+ RIGHT    CompressibilityPhasicitySpontaneityPropertiesThrombus Aging      +---------+---------------+---------+-----------+----------+-------------------+ CFV      Full           Yes      Yes                                      +---------+---------------+---------+-----------+----------+-------------------+ SFJ      Full                                                             +---------+---------------+---------+-----------+----------+-------------------+ FV Prox  Full           Yes      Yes                                      +---------+---------------+---------+-----------+----------+-------------------+ FV Mid   Full           Yes      Yes                                      +---------+---------------+---------+-----------+----------+-------------------+ FV DistalFull           Yes      Yes                                       +---------+---------------+---------+-----------+----------+-------------------+ PFV      Full                                                             +---------+---------------+---------+-----------+----------+-------------------+ POP      Full           Yes      Yes                                      +---------+---------------+---------+-----------+----------+-------------------+  PTV                                                   Not well visualized +---------+---------------+---------+-----------+----------+-------------------+ PERO     Full                                                             +---------+---------------+---------+-----------+----------+-------------------+   +----+---------------+---------+-----------+----------+--------------+ LEFTCompressibilityPhasicitySpontaneityPropertiesThrombus Aging +----+---------------+---------+-----------+----------+--------------+ CFV Full           Yes      Yes                                 +----+---------------+---------+-----------+----------+--------------+    Summary: RIGHT: - There is no evidence of deep vein thrombosis in the lower extremity.  - No cystic structure found in the popliteal fossa.  LEFT: - No evidence of common femoral vein obstruction.   *See table(s) above for measurements and observations.    Preliminary     IMPRESSION: Melena  Iron deficiency anemia Fecal occult positive stool History gastric ulceration History colonic AVM Atrial fibrillation on Xarelto, last dose 02/26/23 AM Coronary artery disease on clopidogrel, last dose 02/26/23 AM History CVA  PLAN: -Recommend small bowel enteroscopy to further evaluate melena, no risk factors for ulcer disease, has history of AVM in colon which may also be present in small intestine -Discussed procedure with patient and his grand daughter at bedside, discussed benefits, alternatives and risks of procedure including  bleeding/infection/perforation/missed lesion/anesthesia, he verbalized understanding and elected to porceed -Has been NPO, attempt to add case to endoscopy schedule this afternoon, if no availability will add for 02/28/23 -Continue IV PPI Q12Hr -Continue to hold Xarelto and Plavix -Trend H/H, transfuse for Hgb < 7 -If unremarkable findings on small bowel enteroscopy, would recommend colonoscopy for treatment of AVM, discussed this with patient and he is understanding -Further recommendations to follow pending procedure   LOS: 1 day   Liliane Shi, DO [facility] Gastroenterology

## 2023-02-27 NOTE — Brief Op Note (Signed)
02/27/2023  3:10 PM  PATIENT:  Tristan Gardner  88 y.o. 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 A small non-bleeding AVM was noted in 3rd portion of duodenum and hemoclip placed x 1. Scope advanced to 140 cm and no other bleeding source identified. -Trend H/H, may require colonoscopy to treat known AVM of ascending colon if hemoglobin does not improve and melena persists -Ok for regular diet this evening, pending blood count tomorrow and clinical picture, may need bowel preparation for colonoscopy -Discussed with IM Deboraha Sprang GI will follow  Liliane Shi, DO [facility] Gastroenterology

## 2023-02-27 NOTE — Op Note (Signed)
[facility] Patient Name: Tristan Gardner Procedure Date: 02/27/2023 MRN: [id number] Attending MD: Liliane Shi DO, DO, [id number] Date of Birth: 02/05/1935 CSN: [id number] Age: 88 Admit Type: Inpatient Procedure:                Small bowel enteroscopy Indications:              Iron deficiency anemia due to suspected upper                            gastrointestinal bleeding, Melena Providers:                Liliane Shi DO, DO, Lorenza Evangelist, RN,                            Jacquelyn "Jaci" Clelia Croft, RN, Marja Kays,                            Technician Referring MD:              Medicines:                See the Anesthesia note for documentation of the                            administered medications Complications:            No immediate complications. Estimated Blood Loss:     Estimated blood loss was minimal. Procedure:                Pre-Anesthesia Assessment:                           - ASA Grade Assessment: III - A patient with severe                            systemic disease.                           - The risks and benefits of the procedure and the                            sedation options and risks were discussed with the                            patient. All questions were answered and informed                            consent was obtained.                           After obtaining informed consent, the endoscope was                            passed under direct vision. Throughout the                            procedure, the patient's blood pressure, pulse, and  oxygen saturations were monitored continuously. The                            [id number]-HQ190L ([id number]) Olympus colonoscope was                            introduced through the mouth and advanced to the                            jejunum, to the 140 cm mark (from the incisors).                            The small bowel enteroscopy was somewhat difficult                             due to significant looping. Successful completion                            of the procedure was aided by straightening and                            shortening the scope to obtain bowel loop                            reduction. The patient tolerated the procedure well. Scope In: Scope Out: Findings:      Red blood was found in the cardia.      The entire examined stomach was normal.      A single angiodysplastic lesion with no bleeding was found in the third       portion of the duodenum. For hemostasis, one hemostatic clip was       successfully placed. There was no bleeding at the end of the procedure.      There was no evidence of significant pathology at 140 cm (from the       incisors). Impression:               - Red blood in the cardia.                           - Normal stomach.                           - A single non-bleeding angiodysplastic lesion in                            the duodenum. Clip was placed.                           - The examined portion of the jejunum was normal.                           - No specimens collected. Recommendation:           - Return patient to hospital ward for ongoing care.                           -  Check hemoglobin q 12 hours until stable.                           - Resume regular diet.                           - May require colonoscopy for treatment of known                            ascending colon AVM pending H/H trend and if                            further melena were to persist.                           - Continue to hold Xarelto and clopidogrel for now. Procedure Code(s):        --- Professional ---                           [phone], Small intestinal endoscopy, enteroscopy                            beyond second portion of duodenum, not including                            ileum; with control of bleeding (eg, injection,                            bipolar cautery, unipolar cautery, laser,  heater                            probe, stapler, plasma coagulator) Diagnosis Code(s):        --- Professional ---                           K92.2, Gastrointestinal hemorrhage, unspecified                           K31.819, Angiodysplasia of stomach and duodenum                            without bleeding                           D50.9, Iron deficiency anemia, unspecified                           K92.1, Melena (includes Hematochezia) CPT copyright 2022 American Medical Association. All rights reserved. The codes documented in this report are preliminary and upon coder review may  be revised to meet current compliance requirements. Dr Liliane Shi, DO Liliane Shi DO, DO 02/27/2023 3:19:44 PM Number of Addenda: 0

## 2023-02-27 NOTE — Progress Notes (Signed)
RLE venous duplex has been completed.   Results can be found under chart review under CV PROC. 02/27/2023 10:52 AM Enma Maeda RVT, RDMS

## 2023-02-27 NOTE — Plan of Care (Signed)

## 2023-02-27 NOTE — Anesthesia Procedure Notes (Signed)
Procedure Name: MAC Date/Time: 02/27/2023 2:38 PM  Performed by: Ludwig Lean, CRNAPre-anesthesia Checklist: Patient identified, Emergency Drugs available, Suction available and Patient being monitored Patient Re-evaluated:Patient Re-evaluated prior to induction Oxygen Delivery Method: Simple face mask Placement Confirmation: positive ETCO2 and breath sounds checked- equal and bilateral

## 2023-02-27 NOTE — Progress Notes (Signed)
2 units of PRBC was completed for this patient. Patient tolerating well Vital signs WDL Will continue to monitor patient

## 2023-02-27 NOTE — Plan of Care (Signed)
  Problem: Education: Goal: Knowledge of General Education information will improve Description: Including pain rating scale, medication(s)/side effects and non-pharmacologic comfort measures Outcome: Progressing   Problem: Pain Managment: Goal: General experience of comfort will improve Outcome: Progressing   Problem: Safety: Goal: Ability to remain free from injury will improve Outcome: Progressing   

## 2023-02-27 NOTE — Progress Notes (Signed)
PROGRESS NOTE    Tristan Gardner  NFA:[id number] DOB: 09-14-1934 DOA: 02/26/2023 PCP: [name], MD  Chief Complaint  Patient presents with   Loss of Consciousness    Brief Narrative:   88 year old male with past medical history of atrial fibrillation on chronic anticoagulation, CAD, history of CVA maintained on Plavix, history of GI bleed and multiple other medical issues who is presenting after episode of loss of consciousness, found to have symptomatic anemia.  Admits to chronic NSAID use.   GI has been consulted.    Assessment & Plan:   Principal Problem:   Symptomatic anemia Active Problems:   Essential hypertension   H/O: CVA (cerebrovascular accident)   Dyslipidemia   Syncope and collapse   GI bleed   Atrial fibrillation, chronic (HCC)   CAD (coronary artery disease)  Acute Blood Loss Anemia Symptomatic anemia  GI bleed Presented with Hb 6.4 Improved after 2 units of pRBC to 7.5 today Will continue to trend PPI BID Appreciate GI assistance Xarelto and plavix on hold  Avoid NSAIDs   Syncope and collapse -Likely due to above, monitor   Chronic anticoagulation  Atrial fibrillation  -Xarelto on hold   History of CVA -xarelto on hold -Plavix on hold   Antiplatelet therapy and anticoagulation On plavix and xarelto prior to admission Indication for xarelto is atrial fibrillation.  It's not clear to me indication for plavix.  On speaking to daughter, she notes patient was treated for NSTEMI at [facility] and is on plavix due to this.  I don't see this on care everywhere.  Also documented as being indicated for stroke, but I don't see neurology notes explaining rationale for both xarelto and antiplatelets.  His daughter denies any history of stents.  Indication for xarelto is clear, but indication for plavix on top of that is less so (maybe hx NSTEMI or stroke?) -> would be reasonable to consider d/c plavix, but would be helpful to have more records.  Essential  hypertension BP meds currently on hold due to ABLA   Dyslipidemia -Lipitor on hold   RLE larger than left -[place] without DVT   Chronic leg pain with involuntary spasms Peripheral neuropathy -Currently on gabapentin baclofen trazodone and Cymbalta.  Continued -Per patient present since last admission.  Likely related to lumbar cord compression sp laminectomy.  PT outpatient    DVT prophylaxis: SCD Code Status: SCD Family Communication: daughter over phone Disposition:   Status is: Inpatient Remains inpatient appropriate because: need for continued inpatient care   Consultants:  GI  Procedures:  none  Antimicrobials:  Anti-infectives (From admission, onward)    None       Subjective: No complaints  Objective: Vitals:   02/27/23 0358 02/27/23 0522 02/27/23 1020 02/27/23 1325  BP: 105/61 (!) 117/57 (!) 141/71 (!) 157/70  Pulse: (!) 59 (!) 59 90 (!) 59  Resp:  18 16 20   Temp: 97.6 F (36.4 C) 98.2 F (36.8 C) 98.3 F (36.8 C) 98.1 F (36.7 C)  TempSrc: Oral Oral Oral Oral  SpO2: 99% 97% 96% 99%    Intake/Output Summary (Last 24 hours) at 02/27/2023 1331 Last data filed at 02/27/2023 0827 Gross per 24 hour  Intake 695.64 ml  Output 800 ml  Net -104.36 ml   There were no vitals filed for this visit.  Examination:  General exam: Appears calm and comfortable  Respiratory system: unlabored Cardiovascular system: RRR Gastrointestinal system: Abdomen is nondistended, soft and nontender.  Central nervous system: Alert and oriented. No  focal neurological deficits. Extremities: no LEE   Data Reviewed: I have personally reviewed following labs and imaging studies  CBC: Recent Labs  Lab 02/26/23 1743 02/26/23 2033 02/27/23 0527 02/27/23 0632  WBC 4.5  --   --   --   NEUTROABS 2.9  --   --   --   HGB 6.4* 6.0* 7.8* 7.5*  HCT 21.7* 20.2* 26.6* 24.2*  MCV 78.1*  --   --   --   PLT 302  --   --   --     Basic Metabolic Panel: Recent Labs  Lab  02/26/23 1743 02/27/23 0527  NA 135 132*  K 3.9 3.8  CL 102 100  CO2 23 21*  GLUCOSE 149* 85  BUN 10 9  CREATININE 1.07 0.92  CALCIUM 8.7* 8.6*    GFR: CrCl cannot be calculated (Unknown ideal weight.).  Liver Function Tests: No results for input(s): "AST", "ALT", "ALKPHOS", "BILITOT", "PROT", "ALBUMIN" in the last 168 hours.  CBG: No results for input(s): "GLUCAP" in the last 168 hours.   No results found for this or any previous visit (from the past 240 hour(s)).       Radiology Studies: VAS [place] LOWER EXTREMITY VENOUS (DVT)  Result Date: 02/27/2023  Lower Venous DVT Study Patient Name:  KADAR MERKERSON  Date of Exam:   02/27/2023 Medical Rec #: [id number]      Accession #:    [id number] Date of Birth: [birth year]     Patient Gender: M Patient Age:   73 years Exam Location:  [facility] Procedure:      VAS [place] LOWER EXTREMITY VENOUS (DVT) Referring Phys: [name] --------------------------------------------------------------------------------  Indications: Swelling.  Comparison Study: No previous exams Performing Technologist: Jody Hill RVT, RDMS  Examination Guidelines: Amariah Kierstead complete evaluation includes B-mode imaging, spectral Doppler, color Doppler, and power Doppler as needed of all accessible portions of each vessel. Bilateral testing is considered an integral part of Maydelin Deming complete examination. Limited examinations for reoccurring indications may be performed as noted. The reflux portion of the exam is performed with the patient in reverse Trendelenburg.  +---------+---------------+---------+-----------+----------+-------------------+ RIGHT    CompressibilityPhasicitySpontaneityPropertiesThrombus Aging      +---------+---------------+---------+-----------+----------+-------------------+ CFV      Full           Yes      Yes                                      +---------+---------------+---------+-----------+----------+-------------------+ SFJ      Full                                                              +---------+---------------+---------+-----------+----------+-------------------+ FV Prox  Full           Yes      Yes                                      +---------+---------------+---------+-----------+----------+-------------------+ FV Mid   Full           Yes      Yes                                      +---------+---------------+---------+-----------+----------+-------------------+  FV DistalFull           Yes      Yes                                      +---------+---------------+---------+-----------+----------+-------------------+ PFV      Full                                                             +---------+---------------+---------+-----------+----------+-------------------+ POP      Full           Yes      Yes                                      +---------+---------------+---------+-----------+----------+-------------------+ PTV                                                   Not well visualized +---------+---------------+---------+-----------+----------+-------------------+ PERO     Full                                                             +---------+---------------+---------+-----------+----------+-------------------+   +----+---------------+---------+-----------+----------+--------------+ LEFTCompressibilityPhasicitySpontaneityPropertiesThrombus Aging +----+---------------+---------+-----------+----------+--------------+ CFV Full           Yes      Yes                                 +----+---------------+---------+-----------+----------+--------------+    Summary: RIGHT: - There is no evidence of deep vein thrombosis in the lower extremity.  - No cystic structure found in the popliteal fossa.  LEFT: - No evidence of common femoral vein obstruction.   *See table(s) above for measurements and observations.    Preliminary         Scheduled Meds:  pantoprazole  (PROTONIX) IV  40 mg Intravenous Q12H   Continuous Infusions:  lactated ringers Stopped (02/26/23 2026)     LOS: 1 day    Time spent: over 30 min    Lacretia Nicks, MD [facility]   To contact the attending provider between 7A-7P or the covering provider during after hours 7P-7A, please log into the web site [email or web address] and access using universal [place] password for that web site. If you do not have the password, please call the hospital operator.  02/27/2023, 1:31 PM

## 2023-02-27 NOTE — Anesthesia Preprocedure Evaluation (Signed)
Anesthesia Evaluation  Patient identified by MRN, date of birth, ID band Patient awake    Reviewed: Allergy & Precautions, H&P , NPO status , Patient's Chart, lab work & pertinent test results  Airway Mallampati: II   Neck ROM: full    Dental   Pulmonary former smoker   breath sounds clear to auscultation       Cardiovascular hypertension, + CAD and + Past MI  + dysrhythmias Atrial Fibrillation  Rhythm:regular Rate:Normal     Neuro/Psych CVA    GI/Hepatic PUD,,,  Endo/Other    Renal/GU      Musculoskeletal   Abdominal   Peds  Hematology  (+) Blood dyscrasia, anemia 2 units PBRC given this morning   Anesthesia Other Findings   Reproductive/Obstetrics                             Anesthesia Physical Anesthesia Plan  ASA: 3  Anesthesia Plan: MAC   Post-op Pain Management:    Induction: Intravenous  PONV Risk Score and Plan: 1 and Propofol infusion and Treatment may vary due to age or medical condition  Airway Management Planned: Nasal Cannula  Additional Equipment:   Intra-op Plan:   Post-operative Plan:   Informed Consent: I have reviewed the patients History and Physical, chart, labs and discussed the procedure including the risks, benefits and alternatives for the proposed anesthesia with the patient or authorized representative who has indicated his/her understanding and acceptance.     Dental advisory given  Plan Discussed with: CRNA, Anesthesiologist and Surgeon  Anesthesia Plan Comments:        Anesthesia Quick Evaluation

## 2023-02-27 NOTE — Transfer of Care (Signed)
Immediate Anesthesia Transfer of Care Note  Patient: Tristan Gardner  Procedure(s) Performed: Procedure(s): ENTEROSCOPY (N/A) HEMOSTASIS CLIP PLACEMENT  Patient Location: PACU  Anesthesia Type:MAC  Level of Consciousness:  sedated, patient cooperative and responds to stimulation  Airway & Oxygen Therapy:Patient Spontanous Breathing and Patient connected to face mask oxgen  Post-op Assessment:  Report given to PACU RN and Post -op Vital signs reviewed and stable  Post vital signs:  Reviewed and stable  Last Vitals:  Vitals:   02/27/23 1325 02/27/23 1422  BP: (!) 157/70 137/69  Pulse: (!) 59 (!) 59  Resp: 20 (!) 23  Temp: 36.7 C 36.7 C  SpO2: 99% 94%    Complications: No apparent anesthesia complications

## 2023-02-27 NOTE — H&P (View-Only) (Signed)
[name] Gastroenterology Consult  Referring Provider: No ref. provider found Primary Care Physician:  [name], MD Primary Gastroenterologist: [name]  Reason for Consultation: Anemia and fecal occult positive stool  SUBJECTIVE:   HPI: Tristan Gardner is a 88 y.o. male with past medical history significant for atrial fibrillation on Xarelto, coronary artery disease on Plavix and history of CVA.  Presented to hospital on 02/26/2023 with chief complaint of altered mental status.  Family noted that he had fallen asleep and possibly lost consciousness.  On presentation to emergency department hemoglobin 6.0, now 7.5 status post 2 units PRBC, baseline hemoglobin appears to be 8.5 on 01/17/2023, platelet 302, WBC 4.5, INR 1.5.  No abdominal imaging completed.  Patient noted that he last took Xarelto and Plavix 02/26/2023 in AM.  Patient currently denied any chest pain or shortness of breath.  No abdominal pain.  No nausea or vomiting.  Noted that he has been having melenic stools for the past 1 month.  No NSAID use.  EGD and colonoscopy completed 10/23/2022 (Dr. 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  Past Medical History:  Diagnosis Date   Atrial fibrillation [facility])    Coronary artery disease    Hypertension    NSTEMI (non-ST elevated myocardial infarction) (HCC)    Stroke [facility])    Past Surgical History:  Procedure Laterality Date   BIOPSY  10/23/2022   Procedure: BIOPSY;  Surgeon: [name], MD;  Location: [facility] ENDOSCOPY;  Service: Gastroenterology;;   CARDIAC SURGERY     COLONOSCOPY WITH PROPOFOL N/A 10/23/2022   Procedure: COLONOSCOPY WITH PROPOFOL;  Surgeon: [name], MD;  Location: [facility] ENDOSCOPY;  Service: Gastroenterology;  Laterality: N/A;   ESOPHAGOGASTRODUODENOSCOPY (EGD)  WITH PROPOFOL N/A 10/23/2022   Procedure: ESOPHAGOGASTRODUODENOSCOPY (EGD) WITH PROPOFOL;  Surgeon: [name], MD;  Location: [facility] ENDOSCOPY;  Service: Gastroenterology;  Laterality: N/A;   IR INJECT/THERA/INC NEEDLE/CATH/PLC EPI/LUMB/SAC W/IMG  12/27/2022   LUMBAR LAMINECTOMY/DECOMPRESSION MICRODISCECTOMY N/A 01/03/2023   Procedure: Lumbar Two-Three, Lumbar Three-Four, Lumbar Four-Five Open laminectomies;  Surgeon: [name], MD;  Location: [facility] OR;  Service: Neurosurgery;  Laterality: N/A;   RADIOLOGY WITH ANESTHESIA N/A 12/27/2022   Procedure: TOTAL SPINE MYELOGRAM;  Surgeon: Radiologist, Medication, MD;  Location: [facility] OR;  Service: Radiology;  Laterality: N/A;   ROTATOR CUFF REPAIR     Prior to Admission medications   Medication Sig Start Date End Date Taking? Authorizing Provider  acetaminophen (TYLENOL) 325 MG tablet Take 2 tablets (650 mg total) by mouth every 6 (six) hours as needed for mild pain (or Fever >/= 101). 10/24/22   Elgergawy, [name], MD  albuterol (VENTOLIN HFA) 108 (90 Base) MCG/ACT inhaler Inhale 2 puffs into the lungs every 4 (four) hours as needed for wheezing or shortness of breath. 02/24/20   [provider]  atorvastatin (LIPITOR) 40 MG tablet Take 40 mg by mouth at bedtime. 08/24/22   [provider]  baclofen (LIORESAL) 10 MG tablet Take 10 mg by mouth 3 (three) times daily.    [provider]  benzocaine (ORAJEL) 10 % mucosal gel Use as directed in the mouth or throat 4 (four) times daily as needed for mouth pain. 01/06/23   Ghimire, Werner Lean, MD  clopidogrel (PLAVIX) 75 MG tablet Take 1 tablet (75 mg total) by mouth daily. START ON 7/10 01/10/23  Ghimire, Werner Lean, MD  DULoxetine (CYMBALTA) 30 MG capsule Take 30 mg by mouth 2 (two) times daily.    [provider]  Ferrous Sulfate (IRON PO) Take 1 tablet by mouth daily.    [provider]  gabapentin (NEURONTIN) 300 MG capsule Take 300 mg by mouth 3 (three) times  daily.    [provider]  hydrALAZINE (APRESOLINE) 50 MG tablet Take 1 tablet (50 mg total) by mouth every 8 (eight) hours. 01/06/23   Ghimire, Werner Lean, MD  isosorbide mononitrate (IMDUR) 30 MG 24 hr tablet Take 30 mg by mouth daily. 08/24/22   [provider]  losartan (COZAAR) 25 MG tablet Take 1 tablet (25 mg total) by mouth daily. 10/26/22   Elgergawy, [name], MD  metoprolol succinate (TOPROL-XL) 50 MG 24 hr tablet Take 50 mg by mouth daily. 08/24/22   [provider]  oxyCODONE (OXY IR/ROXICODONE) 5 MG immediate release tablet Take 1 tablet (5 mg total) by mouth every 6 (six) hours as needed for moderate pain or severe pain. 01/06/23   Ghimire, Werner Lean, MD  pantoprazole (PROTONIX) 40 MG tablet Take 1 tablet (40 mg total) by mouth 2 (two) times daily before a meal. Patient not taking: Reported on 12/24/2022 10/24/22 12/23/22  Elgergawy, [name], MD  traZODone (DESYREL) 50 MG tablet Take 50 mg by mouth at bedtime.    [provider]  XARELTO 20 MG TABS tablet Take 1 tablet (20 mg total) by mouth every morning. START ON 7/10 01/10/23   Ghimire, Werner Lean, MD   Current Facility-Administered Medications  Medication Dose Route Frequency Provider Last Rate Last Admin   acetaminophen (TYLENOL) tablet 650 mg  650 mg Oral Q6H PRN [name], MD   650 mg at 02/26/23 2302   Or   acetaminophen (TYLENOL) suppository 650 mg  650 mg Rectal Q6H PRN [name], MD       lactated ringers infusion   Intravenous Continuous [name], MD   Stopped at 02/26/23 2026   ondansetron (ZOFRAN) tablet 4 mg  4 mg Oral Q6H PRN [name], MD       Or   ondansetron (ZOFRAN) injection 4 mg  4 mg Intravenous Q6H PRN Crosley, Debby, MD       pantoprazole (PROTONIX) injection 40 mg  40 mg Intravenous Q12H Crosley, Debby, MD   40 mg at 02/27/23 1150   Allergies as of 02/26/2023 - Review Complete 02/26/2023  Allergen Reaction Noted   Iodine Hives, Itching, and Rash 02/24/2011    Shellfish allergy Hives 05/17/2020   Family History  Problem Relation Age of Onset   Diabetes Mother    Heart attack Father 53   Hypertension Brother    Heart disease Brother    Diabetes Brother    Social History   Socioeconomic History   Marital status: Married    Spouse name: Not on file   Number of children: Not on file   Years of education: Not on file   Highest education level: Not on file  Occupational History   Not on file  Tobacco Use   Smoking status: Former   Smokeless tobacco: Never  Vaping Use   Vaping status: Never Used  Substance and Sexual Activity   Alcohol use: No   Drug use: Never   Sexual activity: Not Currently  Other Topics Concern   Not on file  Social History Narrative   Not on file   Social Determinants of Health  Financial Resource Strain: Low Risk  (02/13/2023)   Received from [facility]   Overall Financial Resource Strain (CARDIA)    Difficulty of Paying Living Expenses: Not very hard  Food Insecurity: No Food Insecurity (02/26/2023)   Hunger Vital Sign    Worried About Running Out of Food in the Last Year: Never true    Ran Out of Food in the Last Year: Never true  Transportation Needs: No Transportation Needs (02/26/2023)   PRAPARE - Administrator, Civil Service (Medical): No    Lack of Transportation (Non-Medical): No  Physical Activity: Insufficiently Active (02/24/2020)   Received from [facility], [facility]   Exercise Vital Sign    Days of Exercise per Week: 2 days    Minutes of Exercise per Session: 10 min  Stress: Stress Concern Present (02/24/2020)   Received from [facility], [facility] - Occupational Stress Questionnaire    Feeling of Stress : Very much  Social Connections: Unknown (11/03/2021)   Received from [facility], [facility]   Social Network    Social Network: Not on file  Intimate Partner Violence: Not At Risk (02/26/2023)   Humiliation,  Afraid, Rape, and Kick questionnaire    Fear of Current or Ex-Partner: No    Emotionally Abused: No    Physically Abused: No    Sexually Abused: No   Review of Systems:  Review of Systems  Respiratory:  Negative for shortness of breath.   Cardiovascular:  Negative for chest pain.  Gastrointestinal:  Positive for melena. Negative for abdominal pain, nausea and vomiting.    OBJECTIVE:   Temp:  [97.6 F (36.4 C)-98.3 F (36.8 C)] 98.3 F (36.8 C) (08/27 1020) Pulse Rate:  [57-90] 90 (08/27 1020) Resp:  [13-24] 16 (08/27 1020) BP: (97-141)/(55-92) 141/71 (08/27 1020) SpO2:  [92 %-100 %] 96 % (08/27 1020) Last BM Date : 02/26/23 Physical Exam Constitutional:      General: He is not in acute distress.    Appearance: He is not ill-appearing, toxic-appearing or diaphoretic.  Cardiovascular:     Rate and Rhythm: Normal rate. Rhythm irregular.  Pulmonary:     Effort: No respiratory distress.     Breath sounds: Normal breath sounds.  Abdominal:     General: Bowel sounds are normal. There is no distension.     Palpations: Abdomen is soft.     Tenderness: There is no abdominal tenderness. There is no guarding.  Musculoskeletal:     Right lower leg: No edema.     Left lower leg: No edema.  Skin:    General: Skin is warm and dry.  Neurological:     Mental Status: He is alert.     Labs: Recent Labs    02/26/23 1743 02/26/23 2033 02/27/23 0527 02/27/23 0632  WBC 4.5  --   --   --   HGB 6.4* 6.0* 7.8* 7.5*  HCT 21.7* 20.2* 26.6* 24.2*  PLT 302  --   --   --    BMET Recent Labs    02/26/23 1743 02/27/23 0527  NA 135 132*  K 3.9 3.8  CL 102 100  CO2 23 21*  GLUCOSE 149* 85  BUN 10 9  CREATININE 1.07 0.92  CALCIUM 8.7* 8.6*   LFT No results for input(s): "PROT", "ALBUMIN", "AST", "ALT", "ALKPHOS", "BILITOT", "BILIDIR", "IBILI" in the last 72 hours. PT/INR Recent Labs    02/27/23 0527  LABPROT 17.9*  INR  1.5*    Diagnostic imaging: VAS [place] LOWER EXTREMITY  VENOUS (DVT)  Result Date: 02/27/2023  Lower Venous DVT Study Patient Name:  APRIL TOMAC  Date of Exam:   02/27/2023 Medical Rec #: [id number]      Accession #:    [id number] Date of Birth: 1934/11/27     Patient Gender: M Patient Age:   55 years Exam Location:  [facility] Procedure:      VAS [place] LOWER EXTREMITY VENOUS (DVT) Referring Phys: [name] --------------------------------------------------------------------------------  Indications: Swelling.  Comparison Study: No previous exams Performing Technologist: Jody Hill RVT, RDMS  Examination Guidelines: A complete evaluation includes B-mode imaging, spectral Doppler, color Doppler, and power Doppler as needed of all accessible portions of each vessel. Bilateral testing is considered an integral part of a complete examination. Limited examinations for reoccurring indications may be performed as noted. The reflux portion of the exam is performed with the patient in reverse Trendelenburg.  +---------+---------------+---------+-----------+----------+-------------------+ RIGHT    CompressibilityPhasicitySpontaneityPropertiesThrombus Aging      +---------+---------------+---------+-----------+----------+-------------------+ CFV      Full           Yes      Yes                                      +---------+---------------+---------+-----------+----------+-------------------+ SFJ      Full                                                             +---------+---------------+---------+-----------+----------+-------------------+ FV Prox  Full           Yes      Yes                                      +---------+---------------+---------+-----------+----------+-------------------+ FV Mid   Full           Yes      Yes                                      +---------+---------------+---------+-----------+----------+-------------------+ FV DistalFull           Yes      Yes                                       +---------+---------------+---------+-----------+----------+-------------------+ PFV      Full                                                             +---------+---------------+---------+-----------+----------+-------------------+ POP      Full           Yes      Yes                                      +---------+---------------+---------+-----------+----------+-------------------+  PTV                                                   Not well visualized +---------+---------------+---------+-----------+----------+-------------------+ PERO     Full                                                             +---------+---------------+---------+-----------+----------+-------------------+   +----+---------------+---------+-----------+----------+--------------+ LEFTCompressibilityPhasicitySpontaneityPropertiesThrombus Aging +----+---------------+---------+-----------+----------+--------------+ CFV Full           Yes      Yes                                 +----+---------------+---------+-----------+----------+--------------+    Summary: RIGHT: - There is no evidence of deep vein thrombosis in the lower extremity.  - No cystic structure found in the popliteal fossa.  LEFT: - No evidence of common femoral vein obstruction.   *See table(s) above for measurements and observations.    Preliminary     IMPRESSION: Melena  Iron deficiency anemia Fecal occult positive stool History gastric ulceration History colonic AVM Atrial fibrillation on Xarelto, last dose 02/26/23 AM Coronary artery disease on clopidogrel, last dose 02/26/23 AM History CVA  PLAN: -Recommend small bowel enteroscopy to further evaluate melena, no risk factors for ulcer disease, has history of AVM in colon which may also be present in small intestine -Discussed procedure with patient and his grand daughter at bedside, discussed benefits, alternatives and risks of procedure including  bleeding/infection/perforation/missed lesion/anesthesia, he verbalized understanding and elected to porceed -Has been NPO, attempt to add case to endoscopy schedule this afternoon, if no availability will add for 02/28/23 -Continue IV PPI Q12Hr -Continue to hold Xarelto and Plavix -Trend H/H, transfuse for Hgb < 7 -If unremarkable findings on small bowel enteroscopy, would recommend colonoscopy for treatment of AVM, discussed this with patient and he is understanding -Further recommendations to follow pending procedure   LOS: 1 day   Liliane Shi, DO [facility] Gastroenterology

## 2023-02-27 NOTE — Interval H&P Note (Signed)
History and Physical Interval Note:  02/27/2023 2:29 PM  Tristan Gardner  has presented today for surgery, with the diagnosis of Anemia, melena, history AVM.  The various methods of treatment have been discussed with the patient and family. After consideration of risks, benefits and other options for treatment, the patient has consented to  Procedure(s): ENTEROSCOPY (N/A) as a surgical intervention.  The patient's history has been reviewed, patient examined, no change in status, stable for surgery.  I have reviewed the patient's chart and labs.  Questions were answered to the patient's satisfaction.     Lynann Bologna

## 2023-02-28 ENCOUNTER — Encounter (HOSPITAL_COMMUNITY): Payer: Self-pay | Admitting: Internal Medicine

## 2023-02-28 DIAGNOSIS — D649 Anemia, unspecified: Secondary | ICD-10-CM | POA: Diagnosis not present

## 2023-02-28 DIAGNOSIS — K921 Melena: Secondary | ICD-10-CM | POA: Diagnosis not present

## 2023-02-28 LAB — CBC WITH DIFFERENTIAL/PLATELET
Abs Immature Granulocytes: 0.01 10*3/uL (ref 0.00–0.07)
Basophils Absolute: 0 10*3/uL (ref 0.0–0.1)
Basophils Relative: 0 %
Eosinophils Absolute: 0.2 10*3/uL (ref 0.0–0.5)
Eosinophils Relative: 4 %
HCT: 24.8 % — ABNORMAL LOW (ref 39.0–52.0)
Hemoglobin: 7.6 g/dL — ABNORMAL LOW (ref 13.0–17.0)
Immature Granulocytes: 0 %
Lymphocytes Relative: 22 %
Lymphs Abs: 1 10*3/uL (ref 0.7–4.0)
MCH: 24.6 pg — ABNORMAL LOW (ref 26.0–34.0)
MCHC: 30.6 g/dL (ref 30.0–36.0)
MCV: 80.3 fL (ref 80.0–100.0)
Monocytes Absolute: 0.5 10*3/uL (ref 0.1–1.0)
Monocytes Relative: 11 %
Neutro Abs: 2.9 10*3/uL (ref 1.7–7.7)
Neutrophils Relative %: 63 %
Platelets: 223 10*3/uL (ref 150–400)
RBC: 3.09 MIL/uL — ABNORMAL LOW (ref 4.22–5.81)
RDW: 20.2 % — ABNORMAL HIGH (ref 11.5–15.5)
WBC: 4.6 10*3/uL (ref 4.0–10.5)
nRBC: 0 % (ref 0.0–0.2)

## 2023-02-28 LAB — COMPREHENSIVE METABOLIC PANEL
ALT: 22 U/L (ref 0–44)
AST: 28 U/L (ref 15–41)
Albumin: 2.8 g/dL — ABNORMAL LOW (ref 3.5–5.0)
Alkaline Phosphatase: 70 U/L (ref 38–126)
Anion gap: 12 (ref 5–15)
BUN: 12 mg/dL (ref 8–23)
CO2: 20 mmol/L — ABNORMAL LOW (ref 22–32)
Calcium: 8.4 mg/dL — ABNORMAL LOW (ref 8.9–10.3)
Chloride: 103 mmol/L (ref 98–111)
Creatinine, Ser: 1.01 mg/dL (ref 0.61–1.24)
GFR, Estimated: >60 mL/min (ref >60)
Glucose, Bld: 110 mg/dL — ABNORMAL HIGH (ref 70–99)
Potassium: 4.7 mmol/L (ref 3.5–5.1)
Sodium: 135 mmol/L (ref 135–145)
Total Bilirubin: 0.7 mg/dL (ref 0.3–1.2)
Total Protein: 6.8 g/dL (ref 6.5–8.1)

## 2023-02-28 LAB — TYPE AND SCREEN
ABO/RH(D): O POS
Antibody Screen: NEGATIVE
Unit division: 0
Unit division: 0

## 2023-02-28 LAB — BPAM RBC
Blood Product Expiration Date: 202409232359
Blood Product Expiration Date: 202409232359
ISSUE DATE / TIME: 202408262058
ISSUE DATE / TIME: 202408270100
Unit Type and Rh: 5100
Unit Type and Rh: 5100

## 2023-02-28 LAB — MAGNESIUM: Magnesium: 2 mg/dL (ref 1.7–2.4)

## 2023-02-28 LAB — PHOSPHORUS: Phosphorus: 3.6 mg/dL (ref 2.5–4.6)

## 2023-02-28 MED ORDER — METHOCARBAMOL 500 MG PO TABS
500.0000 mg | ORAL_TABLET | Freq: Three times a day (TID) | ORAL | Status: DC | PRN
  Administered 2023-02-28 – 2023-03-01 (×3): 500 mg via ORAL
  Filled 2023-02-28 (×3): qty 1

## 2023-02-28 NOTE — Hospital Course (Signed)
Tristan Gardner is an 88 year old male with past medical history of atrial fibrillation on chronic anticoagulation, CAD, history of GI bleed and multiple other medical issues who presented after syncope and was subsequently found to have significant anemia. He underwent blood transfusions on admission and GI was consulted for further evaluation.

## 2023-02-28 NOTE — Anesthesia Postprocedure Evaluation (Signed)
Anesthesia Post Note  Patient: Tristan Gardner  Procedure(s) Performed: ENTEROSCOPY HEMOSTASIS CLIP PLACEMENT     Patient location during evaluation: Endoscopy Anesthesia Type: MAC Level of consciousness: awake Pain management: pain level controlled Vital Signs Assessment: post-procedure vital signs reviewed and stable Respiratory status: spontaneous breathing Cardiovascular status: stable Postop Assessment: no apparent nausea or vomiting Anesthetic complications: no  No notable events documented.  Last Vitals:  Vitals:   02/27/23 2024 02/28/23 0358  BP: 101/65 (!) 153/68  Pulse: (!) 59 (!) 59  Resp: 18 16  Temp: 36.5 C 36.8 C  SpO2: 100% 100%    Last Pain:  Vitals:   02/28/23 0537  TempSrc:   PainSc: 6                  John F Scharlene Corn

## 2023-02-28 NOTE — TOC CM/SW Note (Signed)
Transition of Care [facility]) - Inpatient Brief Assessment   Patient Details  Name: Tristan Gardner MRN: [id number] Date of Birth: 1935/06/23  [facility]) CM/SW Contact:    Otelia Santee, LCSW Phone Number: 02/28/2023, 11:03 AM   Clinical Narrative: Pt active with [facility] for HHPT/OT. ROC orders will need to be placed prior to discharge.    Transition of Care Asessment: Insurance and Status: Insurance coverage has been reviewed Patient has primary care physician: Yes Home environment has been reviewed: Home w/ family Prior level of function:: Modified independent Prior/Current Home Services: Current home services (Home Health PT/OT w/ [facility]) Social Determinants of Health Reivew: SDOH reviewed no interventions necessary Readmission risk has been reviewed: Yes Transition of care needs: no transition of care needs at this time

## 2023-02-28 NOTE — Progress Notes (Signed)
Progress Note    Tristan Gardner   ZOX:[id number]  DOB: 12-23-1934  DOA: 02/26/2023     2 PCP: [name], MD  Initial CC: syncope  Hospital Course: Tristan Gardner is an 88 year old male with past medical history of atrial fibrillation on chronic anticoagulation, CAD, history of GI bleed and multiple other medical issues who presented after syncope and was subsequently found to have significant anemia. He underwent blood transfusions on admission and GI was consulted for further evaluation.  Interval History:  No events overnight.  Tolerated small bowel enteroscopy yesterday with GI.  Updated his daughter on the phone in the room this morning.  Assessment and Plan:  Acute Blood Loss Anemia Symptomatic anemia  GI bleed Presented with Hb 6.4 g/dL; s/p 2 units PRBC - s/p small bowel enteroscopy 8/27; red blood noted in stomach but no obvious source yet - xarelto and plavix on hold - Hgb 7.6 g/dL this am - GI following; possibly plans for colonoscopy - continue PPI    Syncope and collapse -Likely due to above, monitor   Atrial fibrillation  -Xarelto on hold   History of CVA -xarelto on hold -Plavix on hold   Antiplatelet therapy and anticoagulation On plavix and xarelto prior to admission - xarelto for afib - plavix per daughter is for recent NSTEMI; underwent cath showing nonobstructive CAD and he was transitioned from asa to plavix per daughter - will further discuss with Dr. Malachi Bonds (cardiology prior to d/c as well if needed)   Essential hypertension BP meds currently on hold due to ABLA   Dyslipidemia -Lipitor on hold   RLE swelling -[place] without DVT   Chronic leg pain with involuntary spasms Peripheral neuropathy -Currently on gabapentin, baclofen, trazodone, and Cymbalta.  Continued -Per patient present since last admission.  Likely related to lumbar cord compression sp laminectomy.  PT outpatient   Old records reviewed in assessment of this  patient  Antimicrobials:   DVT prophylaxis:  SCDs Start: 02/26/23 2008   Code Status:   Code Status: Full Code  Mobility Assessment (Last 72 Hours)     Mobility Assessment     Row Name 02/28/23 1035 02/27/23 2031 02/27/23 0846 02/26/23 2200     Does patient have an order for bedrest or is patient medically unstable No - Continue assessment No - Continue assessment No - Continue assessment No - Continue assessment    What is the highest level of mobility based on the progressive mobility assessment? Level 5 (Walks with assist in room/hall) - Balance while stepping forward/back and can walk in room with assist - Complete Level 5 (Walks with assist in room/hall) - Balance while stepping forward/back and can walk in room with assist - Complete Level 5 (Walks with assist in room/hall) - Balance while stepping forward/back and can walk in room with assist - Complete Level 5 (Walks with assist in room/hall) - Balance while stepping forward/back and can walk in room with assist - Complete             Barriers to discharge: none Disposition Plan:  Home  Status is: Inpt  Objective: Blood pressure (!) 153/68, pulse (!) 59, temperature 98.2 F (36.8 C), temperature source Oral, resp. rate 16, height 5\' 11"  (1.803 m), weight 120.2 kg, SpO2 100%.  Examination:  Physical Exam Constitutional:      General: He is not in acute distress.    Appearance: Normal appearance.     Comments: Hard of hearing  HENT:  Head: Normocephalic and atraumatic.     Mouth/Throat:     Mouth: Mucous membranes are moist.  Eyes:     Extraocular Movements: Extraocular movements intact.  Cardiovascular:     Rate and Rhythm: Normal rate and regular rhythm.  Pulmonary:     Effort: Pulmonary effort is normal. No respiratory distress.     Breath sounds: Normal breath sounds. No wheezing.  Abdominal:     General: Bowel sounds are normal. There is no distension.     Palpations: Abdomen is soft.     Tenderness:  There is no abdominal tenderness.  Musculoskeletal:        General: Normal range of motion.     Cervical back: Normal range of motion and neck supple.  Skin:    General: Skin is warm and dry.  Neurological:     General: No focal deficit present.     Mental Status: He is alert.  Psychiatric:        Mood and Affect: Mood normal.        Behavior: Behavior normal.      Consultants:  GI  Procedures:  8/27: Small bowel enteroscopy  Data Reviewed: Results for orders placed or performed during the hospital encounter of 02/26/23 (from the past 24 hour(s))  Hemoglobin and hematocrit, blood     Status: Abnormal   Collection Time: 02/27/23  8:22 PM  Result Value Ref Range   Hemoglobin 7.8 (L) 13.0 - 17.0 g/dL   HCT 25.9 (L) 56.3 - 87.5 %  Comprehensive metabolic panel     Status: Abnormal   Collection Time: 02/28/23  5:21 AM  Result Value Ref Range   Sodium 135 135 - 145 mmol/L   Potassium 4.7 3.5 - 5.1 mmol/L   Chloride 103 98 - 111 mmol/L   CO2 20 (L) 22 - 32 mmol/L   Glucose, Bld 110 (H) 70 - 99 mg/dL   BUN 12 8 - 23 mg/dL   Creatinine, Ser 6.43 0.61 - 1.24 mg/dL   Calcium 8.4 (L) 8.9 - 10.3 mg/dL   Total Protein 6.8 6.5 - 8.1 g/dL   Albumin 2.8 (L) 3.5 - 5.0 g/dL   AST 28 15 - 41 U/L   ALT 22 0 - 44 U/L   Alkaline Phosphatase 70 38 - 126 U/L   Total Bilirubin 0.7 0.3 - 1.2 mg/dL   GFR, Estimated >32 >95 mL/min   Anion gap 12 5 - 15  Magnesium     Status: None   Collection Time: 02/28/23  5:21 AM  Result Value Ref Range   Magnesium 2.0 1.7 - 2.4 mg/dL  Phosphorus     Status: None   Collection Time: 02/28/23  5:21 AM  Result Value Ref Range   Phosphorus 3.6 2.5 - 4.6 mg/dL  CBC with Differential/Platelet     Status: Abnormal   Collection Time: 02/28/23  5:21 AM  Result Value Ref Range   WBC 4.6 4.0 - 10.5 K/uL   RBC 3.09 (L) 4.22 - 5.81 MIL/uL   Hemoglobin 7.6 (L) 13.0 - 17.0 g/dL   HCT 18.8 (L) 41.6 - 60.6 %   MCV 80.3 80.0 - 100.0 fL   MCH 24.6 (L) 26.0 - 34.0  pg   MCHC 30.6 30.0 - 36.0 g/dL   RDW 30.1 (H) 60.1 - 09.3 %   Platelets 223 150 - 400 K/uL   nRBC 0.0 0.0 - 0.2 %   Neutrophils Relative % 63 %   Neutro Abs 2.9 1.7 -  7.7 K/uL   Lymphocytes Relative 22 %   Lymphs Abs 1.0 0.7 - 4.0 K/uL   Monocytes Relative 11 %   Monocytes Absolute 0.5 0.1 - 1.0 K/uL   Eosinophils Relative 4 %   Eosinophils Absolute 0.2 0.0 - 0.5 K/uL   Basophils Relative 0 %   Basophils Absolute 0.0 0.0 - 0.1 K/uL   Immature Granulocytes 0 %   Abs Immature Granulocytes 0.01 0.00 - 0.07 K/uL    I have reviewed pertinent nursing notes, vitals, labs, and images as necessary. I have ordered labwork to follow up on as indicated.  I have reviewed the last notes from staff over past 24 hours. I have discussed patient's care plan and test results with nursing staff, CM/SW, and other staff as appropriate.  Time spent: Greater than 50% of the 55 minute visit was spent in counseling/coordination of care for the patient as laid out in the A&P.   LOS: 2 days   Lewie Chamber, MD [facility] 02/28/2023, 2:58 PM

## 2023-02-28 NOTE — Progress Notes (Signed)
[facility] Progress Note  SUBJECTIVE:   Interval history: Tristan Gardner was seen and evaluated today at bedside. Resting comfortably in bed. Noted that he ate today. No abdominal pain. No chest pain or shortness of breath. No nausea or vomiting. Has some lower extremity pain. Per nursing documentation, had large brown bowel movement this AM.   Past Medical History:  Diagnosis Date   Atrial fibrillation (HCC)    Coronary artery disease    Hypertension    NSTEMI (non-ST elevated myocardial infarction) (HCC)    Stroke [facility])    Past Surgical History:  Procedure Laterality Date   BIOPSY  10/23/2022   Procedure: BIOPSY;  Surgeon: [name], MD;  Location: [facility] ENDOSCOPY;  Service: Gastroenterology;;   CARDIAC SURGERY     COLONOSCOPY WITH PROPOFOL N/A 10/23/2022   Procedure: COLONOSCOPY WITH PROPOFOL;  Surgeon: [name], MD;  Location: [facility] ENDOSCOPY;  Service: Gastroenterology;  Laterality: N/A;   ESOPHAGOGASTRODUODENOSCOPY (EGD) WITH PROPOFOL N/A 10/23/2022   Procedure: ESOPHAGOGASTRODUODENOSCOPY (EGD) WITH PROPOFOL;  Surgeon: [name], MD;  Location: [facility] ENDOSCOPY;  Service: Gastroenterology;  Laterality: N/A;   IR INJECT/THERA/INC NEEDLE/CATH/PLC EPI/LUMB/SAC W/IMG  12/27/2022   LUMBAR LAMINECTOMY/DECOMPRESSION MICRODISCECTOMY N/A 01/03/2023   Procedure: Lumbar Two-Three, Lumbar Three-Four, Lumbar Four-Five Open laminectomies;  Surgeon: [name], MD;  Location: [facility] OR;  Service: Neurosurgery;  Laterality: N/A;   RADIOLOGY WITH ANESTHESIA N/A 12/27/2022   Procedure: TOTAL SPINE MYELOGRAM;  Surgeon: Radiologist, Medication, MD;  Location: [facility] OR;  Service: Radiology;  Laterality: N/A;   ROTATOR CUFF REPAIR     Current Facility-Administered Medications  Medication Dose Route Frequency Provider Last Rate Last Admin   acetaminophen (TYLENOL) tablet 650 mg  650 mg Oral Q6H PRN Liliane Shi H, DO   650 mg at 02/28/23 1610   Or   acetaminophen (TYLENOL)  suppository 650 mg  650 mg Rectal Q6H PRN Lynann Bologna, DO       lactated ringers infusion   Intravenous Continuous Lynann Bologna, DO 125 mL/hr at 02/28/23 0225 New Bag at 02/28/23 0225   methocarbamol (ROBAXIN) tablet 500 mg  500 mg Oral Q8H PRN Lewie Chamber, MD   500 mg at 02/28/23 1228   ondansetron (ZOFRAN) tablet 4 mg  4 mg Oral Q6H PRN Lynann Bologna, DO       Or   ondansetron [facility] injection 4 mg  4 mg Intravenous Q6H PRN Liliane Shi H, DO       pantoprazole (PROTONIX) injection 40 mg  40 mg Intravenous Q12H Liliane Shi H, DO   40 mg at 02/28/23 1002   Allergies as of 02/26/2023 - Review Complete 02/26/2023  Allergen Reaction Noted   Iodine Hives, Itching, and Rash 02/24/2011   Shellfish allergy Hives 05/17/2020   Review of Systems:  Review of Systems  Respiratory:  Negative for shortness of breath.   Cardiovascular:  Negative for chest pain.  Gastrointestinal:  Negative for abdominal pain, nausea and vomiting.    OBJECTIVE:   Temp:  [97.7 F (36.5 C)-98.2 F (36.8 C)] 98.2 F (36.8 C) (08/28 0358) Pulse Rate:  [59] 59 (08/28 0358) Resp:  [16-18] 16 (08/28 0358) BP: (101-153)/(65-68) 153/68 (08/28 0358) SpO2:  [100 %] 100 % (08/28 0358) Last BM Date : 02/27/23 Physical Exam Constitutional:      General: He is not in acute distress.    Appearance: He is not ill-appearing, toxic-appearing or diaphoretic.  Cardiovascular:     Rate and Rhythm: Normal rate and regular rhythm.  Pulmonary:     Effort: No respiratory distress.     Breath sounds: Normal breath sounds.  Abdominal:     General: Bowel sounds are normal. There is no distension.     Palpations: Abdomen is soft.     Tenderness: There is no abdominal tenderness. There is no guarding.  Neurological:     Mental Status: He is alert.     Labs: Recent Labs    02/26/23 1743 02/26/23 2033 02/27/23 1351 02/27/23 2022 02/28/23 0521  WBC 4.5  --   --   --  4.6  HGB 6.4*   < > 7.4*  7.8* 7.6*  HCT 21.7*   < > 23.6* 24.9* 24.8*  PLT 302  --   --   --  223   < > = values in this interval not displayed.   BMET Recent Labs    02/26/23 1743 02/27/23 0527 02/28/23 0521  NA 135 132* 135  K 3.9 3.8 4.7  CL 102 100 103  CO2 23 21* 20*  GLUCOSE 149* 85 110*  BUN 10 9 12   CREATININE 1.07 0.92 1.01  CALCIUM 8.7* 8.6* 8.4*   LFT Recent Labs    02/28/23 0521  PROT 6.8  ALBUMIN 2.8*  AST 28  ALT 22  ALKPHOS 70  BILITOT 0.7   PT/INR Recent Labs    02/27/23 0527  LABPROT 17.9*  INR 1.5*   Diagnostic imaging: VAS [place] LOWER EXTREMITY VENOUS (DVT)  Result Date: 02/27/2023  Lower Venous DVT Study Patient Name:  Tristan Gardner  Date of Exam:   02/27/2023 Medical Rec #: [id number]      Accession #:    [id number] Date of Birth: August 25, 1934     Patient Gender: M Patient Age:   88 years Exam Location:  [facility] Procedure:      VAS [place] LOWER EXTREMITY VENOUS (DVT) Referring Phys: [name] --------------------------------------------------------------------------------  Indications: Swelling.  Comparison Study: No previous exams Performing Technologist: Jody Hill RVT, RDMS  Examination Guidelines: A complete evaluation includes B-mode imaging, spectral Doppler, color Doppler, and power Doppler as needed of all accessible portions of each vessel. Bilateral testing is considered an integral part of a complete examination. Limited examinations for reoccurring indications may be performed as noted. The reflux portion of the exam is performed with the patient in reverse Trendelenburg.  +---------+---------------+---------+-----------+----------+-------------------+ RIGHT    CompressibilityPhasicitySpontaneityPropertiesThrombus Aging      +---------+---------------+---------+-----------+----------+-------------------+ CFV      Full           Yes      Yes                                       +---------+---------------+---------+-----------+----------+-------------------+ SFJ      Full                                                             +---------+---------------+---------+-----------+----------+-------------------+ FV Prox  Full           Yes      Yes                                      +---------+---------------+---------+-----------+----------+-------------------+  FV Mid   Full           Yes      Yes                                      +---------+---------------+---------+-----------+----------+-------------------+ FV DistalFull           Yes      Yes                                      +---------+---------------+---------+-----------+----------+-------------------+ PFV      Full                                                             +---------+---------------+---------+-----------+----------+-------------------+ POP      Full           Yes      Yes                                      +---------+---------------+---------+-----------+----------+-------------------+ PTV                                                   Not well visualized +---------+---------------+---------+-----------+----------+-------------------+ PERO     Full                                                             +---------+---------------+---------+-----------+----------+-------------------+   +----+---------------+---------+-----------+----------+--------------+ LEFTCompressibilityPhasicitySpontaneityPropertiesThrombus Aging +----+---------------+---------+-----------+----------+--------------+ CFV Full           Yes      Yes                                 +----+---------------+---------+-----------+----------+--------------+     Summary: RIGHT: - There is no evidence of deep vein thrombosis in the lower extremity.  - No cystic structure found in the popliteal fossa.  LEFT: - No evidence of common femoral vein obstruction.   *See  table(s) above for measurements and observations. Electronically signed by Waverly Ferrari MD on 02/27/2023 at 3:04:57 PM.    Final     IMPRESSION: Melena, resolved   -EGD 02/27/23 with AVM noted in 3rd portion of duodenum s/p hemoclip placement Iron deficiency anemia Fecal occult positive stool History gastric ulceration History colonic AVM Atrial fibrillation on Xarelto, last dose 02/26/23 AM Coronary artery disease on clopidogrel, last dose 02/26/23 AM History CVA  PLAN: -Trend H/H, transfuse for Hgb < 7  -Pending Hgb trend, may require colonoscopy  -Xarelto and Plavix on hold pending above -Ok for diet as tolerated  -[facility] will follow   LOS: 2 days   Liliane Shi, [facility]

## 2023-03-01 DIAGNOSIS — D649 Anemia, unspecified: Secondary | ICD-10-CM | POA: Diagnosis not present

## 2023-03-01 DIAGNOSIS — K921 Melena: Secondary | ICD-10-CM | POA: Diagnosis not present

## 2023-03-01 LAB — MAGNESIUM: Magnesium: 1.8 mg/dL (ref 1.7–2.4)

## 2023-03-01 LAB — BASIC METABOLIC PANEL
Anion gap: 10 (ref 5–15)
BUN: 8 mg/dL (ref 8–23)
CO2: 23 mmol/L (ref 22–32)
Calcium: 8.5 mg/dL — ABNORMAL LOW (ref 8.9–10.3)
Chloride: 101 mmol/L (ref 98–111)
Creatinine, Ser: 0.84 mg/dL (ref 0.61–1.24)
GFR, Estimated: >60 mL/min (ref >60)
Glucose, Bld: 110 mg/dL — ABNORMAL HIGH (ref 70–99)
Potassium: 3.4 mmol/L — ABNORMAL LOW (ref 3.5–5.1)
Sodium: 134 mmol/L — ABNORMAL LOW (ref 135–145)

## 2023-03-01 LAB — CBC
HCT: 24.9 % — ABNORMAL LOW (ref 39.0–52.0)
Hemoglobin: 7.7 g/dL — ABNORMAL LOW (ref 13.0–17.0)
MCH: 24.1 pg — ABNORMAL LOW (ref 26.0–34.0)
MCHC: 30.9 g/dL (ref 30.0–36.0)
MCV: 77.8 fL — ABNORMAL LOW (ref 80.0–100.0)
Platelets: 266 10*3/uL (ref 150–400)
RBC: 3.2 MIL/uL — ABNORMAL LOW (ref 4.22–5.81)
RDW: 20.3 % — ABNORMAL HIGH (ref 11.5–15.5)
WBC: 4.6 10*3/uL (ref 4.0–10.5)
nRBC: 0 % (ref 0.0–0.2)

## 2023-03-01 MED ORDER — CLOPIDOGREL BISULFATE 75 MG PO TABS
75.0000 mg | ORAL_TABLET | Freq: Every day | ORAL | Status: DC
  Administered 2023-03-01 – 2023-03-02 (×2): 75 mg via ORAL
  Filled 2023-03-01 (×2): qty 1

## 2023-03-01 MED ORDER — POTASSIUM CHLORIDE CRYS ER 20 MEQ PO TBCR
40.0000 meq | EXTENDED_RELEASE_TABLET | Freq: Once | ORAL | Status: AC
  Administered 2023-03-01: 40 meq via ORAL
  Filled 2023-03-01: qty 2

## 2023-03-01 MED ORDER — RIVAROXABAN 20 MG PO TABS
20.0000 mg | ORAL_TABLET | Freq: Every day | ORAL | Status: DC
  Administered 2023-03-01 – 2023-03-02 (×2): 20 mg via ORAL
  Filled 2023-03-01 (×2): qty 1

## 2023-03-01 NOTE — Progress Notes (Signed)
Progress Note    Tristan Gardner   VFI:[id number]  DOB: April 15, 1935  DOA: 02/26/2023     3 PCP: [name], MD  Initial CC: syncope  Hospital Course: [name] is an 88 year old male with past medical history of atrial fibrillation on chronic anticoagulation, CAD, history of GI bleed and multiple other medical issues who presented after syncope and was subsequently found to have significant anemia. He underwent blood transfusions on admission and GI was consulted for further evaluation.  Interval History:  No events overnight.  Sitting up in recliner this morning.  Hemoglobin remaining stable.  No reported signs of bleeding. Discussed with GI, resuming blood thinners today and will trend H/H response. Also updated daughter Burna Mortimer) on the phone while in the room.  Assessment and Plan:  Acute Blood Loss Anemia Symptomatic anemia  GI bleed Presented with Hb 6.4 g/dL; s/p 2 units PRBC - s/p small bowel enteroscopy 8/27; red blood noted in stomach but no obvious source yet - xarelto and plavix on hold; will resume on 8/29 and watch response - Hgb 7.7 g/dL this am - GI following - continue PPI    Syncope and collapse -Likely due to above, monitor   Atrial fibrillation  -Xarelto resumed 8/29   History of CVA -xarelto resumed 8/29 -Plavix resumed 8/29   Antiplatelet therapy and anticoagulation On plavix and xarelto prior to admission - xarelto for afib - plavix per daughter is for recent NSTEMI; underwent cath showing nonobstructive CAD and he was transitioned from asa to plavix per daughter - follows with Dr. Malachi Bonds (cardiology)    Essential hypertension BP meds currently on hold due to ABLA   Dyslipidemia -Lipitor on hold   RLE swelling -[place] without DVT   Chronic leg pain with involuntary spasms Peripheral neuropathy -Currently on gabapentin, baclofen, trazodone, and Cymbalta.  Continued -Per patient present since last admission.  Likely related to lumbar cord  compression sp laminectomy.  PT outpatient   Old records reviewed in assessment of this patient  Antimicrobials:   DVT prophylaxis:  SCDs Start: 02/26/23 2008 rivaroxaban (XARELTO) tablet 20 mg   Code Status:   Code Status: Full Code  Mobility Assessment (Last 72 Hours)     Mobility Assessment     Row Name 03/01/23 1059 02/28/23 2200 02/28/23 1035 02/27/23 2031 02/27/23 0846   Does patient have an order for bedrest or is patient medically unstable No - Continue assessment No - Continue assessment No - Continue assessment No - Continue assessment No - Continue assessment   What is the highest level of mobility based on the progressive mobility assessment? Level 5 (Walks with assist in room/hall) - Balance while stepping forward/back and can walk in room with assist - Complete Level 5 (Walks with assist in room/hall) - Balance while stepping forward/back and can walk in room with assist - Complete Level 5 (Walks with assist in room/hall) - Balance while stepping forward/back and can walk in room with assist - Complete Level 5 (Walks with assist in room/hall) - Balance while stepping forward/back and can walk in room with assist - Complete Level 5 (Walks with assist in room/hall) - Balance while stepping forward/back and can walk in room with assist - Complete    Row Name 02/26/23 2200           Does patient have an order for bedrest or is patient medically unstable No - Continue assessment       What is the highest level of mobility based on  the progressive mobility assessment? Level 5 (Walks with assist in room/hall) - Balance while stepping forward/back and can walk in room with assist - Complete                Barriers to discharge: none Disposition Plan:  Home  Status is: Inpt  Objective: Blood pressure 139/86, pulse (!) 101, temperature 98.7 F (37.1 C), temperature source Oral, resp. rate 16, height 5\' 11"  (1.803 m), weight 120.2 kg, SpO2 99%.  Examination:  Physical  Exam Constitutional:      General: He is not in acute distress.    Appearance: Normal appearance.     Comments: Hard of hearing  HENT:     Head: Normocephalic and atraumatic.     Mouth/Throat:     Mouth: Mucous membranes are moist.  Eyes:     Extraocular Movements: Extraocular movements intact.  Cardiovascular:     Rate and Rhythm: Normal rate and regular rhythm.  Pulmonary:     Effort: Pulmonary effort is normal. No respiratory distress.     Breath sounds: Normal breath sounds. No wheezing.  Abdominal:     General: Bowel sounds are normal. There is no distension.     Palpations: Abdomen is soft.     Tenderness: There is no abdominal tenderness.  Musculoskeletal:        General: Normal range of motion.     Cervical back: Normal range of motion and neck supple.  Skin:    General: Skin is warm and dry.  Neurological:     General: No focal deficit present.     Mental Status: He is alert.  Psychiatric:        Mood and Affect: Mood normal.        Behavior: Behavior normal.      Consultants:  GI  Procedures:  8/27: Small bowel enteroscopy  Data Reviewed: Results for orders placed or performed during the hospital encounter of 02/26/23 (from the past 24 hour(s))  CBC     Status: Abnormal   Collection Time: 03/01/23  5:19 AM  Result Value Ref Range   WBC 4.6 4.0 - 10.5 K/uL   RBC 3.20 (L) 4.22 - 5.81 MIL/uL   Hemoglobin 7.7 (L) 13.0 - 17.0 g/dL   HCT 82.9 (L) 56.2 - 13.0 %   MCV 77.8 (L) 80.0 - 100.0 fL   MCH 24.1 (L) 26.0 - 34.0 pg   MCHC 30.9 30.0 - 36.0 g/dL   RDW 86.5 (H) 78.4 - 69.6 %   Platelets 266 150 - 400 K/uL   nRBC 0.0 0.0 - 0.2 %  Basic metabolic panel     Status: Abnormal   Collection Time: 03/01/23  5:20 AM  Result Value Ref Range   Sodium 134 (L) 135 - 145 mmol/L   Potassium 3.4 (L) 3.5 - 5.1 mmol/L   Chloride 101 98 - 111 mmol/L   CO2 23 22 - 32 mmol/L   Glucose, Bld 110 (H) 70 - 99 mg/dL   BUN 8 8 - 23 mg/dL   Creatinine, Ser 2.95 0.61 - 1.24  mg/dL   Calcium 8.5 (L) 8.9 - 10.3 mg/dL   GFR, Estimated >28 >41 mL/min   Anion gap 10 5 - 15  Magnesium     Status: None   Collection Time: 03/01/23  5:20 AM  Result Value Ref Range   Magnesium 1.8 1.7 - 2.4 mg/dL    I have reviewed pertinent nursing notes, vitals, labs, and images as necessary. I have  ordered labwork to follow up on as indicated.  I have reviewed the last notes from staff over past 24 hours. I have discussed patient's care plan and test results with nursing staff, CM/SW, and other staff as appropriate.  Time spent: Greater than 50% of the 55 minute visit was spent in counseling/coordination of care for the patient as laid out in the A&P.   LOS: 3 days   Lewie Chamber, MD [facility] 03/01/2023, 4:04 PM

## 2023-03-01 NOTE — Progress Notes (Signed)
[facility] Progress Note  SUBJECTIVE:   Interval history: Tristan Gardner was seen and evaluated today at bedside. Resting in chair at bedside. Noted having some right shoulder and right forearm pain. No abdominal pain. No chest pain, no shortness of breath. Had BM yesterday, no BM today. Tolerating diet.   Past Medical History:  Diagnosis Date   Atrial fibrillation [facility])    Coronary artery disease    Hypertension    NSTEMI (non-ST elevated myocardial infarction) (HCC)    Stroke [facility])    Past Surgical History:  Procedure Laterality Date   BIOPSY  10/23/2022   Procedure: BIOPSY;  Surgeon: [name], MD;  Location: [facility] ENDOSCOPY;  Service: Gastroenterology;;   CARDIAC SURGERY     COLONOSCOPY WITH PROPOFOL N/A 10/23/2022   Procedure: COLONOSCOPY WITH PROPOFOL;  Surgeon: [name], MD;  Location: [facility] ENDOSCOPY;  Service: Gastroenterology;  Laterality: N/A;   ENTEROSCOPY N/A 02/27/2023   Procedure: ENTEROSCOPY;  Surgeon: Lynann Bologna, DO;  Location: [facility] ENDOSCOPY;  Service: Gastroenterology;  Laterality: N/A;   ESOPHAGOGASTRODUODENOSCOPY (EGD) WITH PROPOFOL N/A 10/23/2022   Procedure: ESOPHAGOGASTRODUODENOSCOPY (EGD) WITH PROPOFOL;  Surgeon: [name], MD;  Location: [facility] ENDOSCOPY;  Service: Gastroenterology;  Laterality: N/A;   HEMOSTASIS CLIP PLACEMENT  02/27/2023   Procedure: HEMOSTASIS CLIP PLACEMENT;  Surgeon: Lynann Bologna, DO;  Location: [facility] ENDOSCOPY;  Service: Gastroenterology;;   IR INJECT/THERA/INC NEEDLE/CATH/PLC EPI/LUMB/SAC W/IMG  12/27/2022   LUMBAR LAMINECTOMY/DECOMPRESSION MICRODISCECTOMY N/A 01/03/2023   Procedure: Lumbar Two-Three, Lumbar Three-Four, Lumbar Four-Five Open laminectomies;  Surgeon: [name], MD;  Location: [facility] OR;  Service: Neurosurgery;  Laterality: N/A;   RADIOLOGY WITH ANESTHESIA N/A 12/27/2022   Procedure: TOTAL SPINE MYELOGRAM;  Surgeon: Radiologist, Medication, MD;  Location: [facility] OR;  Service: Radiology;   Laterality: N/A;   ROTATOR CUFF REPAIR     Current Facility-Administered Medications  Medication Dose Route Frequency Provider Last Rate Last Admin   acetaminophen (TYLENOL) tablet 650 mg  650 mg Oral Q6H PRN Liliane Shi H, DO   650 mg at 02/28/23 1062   Or   acetaminophen (TYLENOL) suppository 650 mg  650 mg Rectal Q6H PRN Lynann Bologna, DO       clopidogrel (PLAVIX) tablet 75 mg  75 mg Oral Daily Lewie Chamber, MD       methocarbamol (ROBAXIN) tablet 500 mg  500 mg Oral Q8H PRN Lewie Chamber, MD   500 mg at 02/28/23 1228   ondansetron (ZOFRAN) tablet 4 mg  4 mg Oral Q6H PRN Lynann Bologna, DO       Or   ondansetron [facility]) injection 4 mg  4 mg Intravenous Q6H PRN Liliane Shi H, DO       pantoprazole (PROTONIX) injection 40 mg  40 mg Intravenous Q12H Liliane Shi H, DO   40 mg at 02/28/23 2107   rivaroxaban (XARELTO) tablet 20 mg  20 mg Oral Q lunch Lewie Chamber, MD       Allergies as of 02/26/2023 - Review Complete 02/26/2023  Allergen Reaction Noted   Iodine Hives, Itching, and Rash 02/24/2011   Shellfish allergy Hives 05/17/2020   Review of Systems:  Review of Systems  Respiratory:  Negative for shortness of breath.   Cardiovascular:  Negative for chest pain.  Gastrointestinal:  Negative for abdominal pain, nausea and vomiting.  Musculoskeletal:  Positive for joint pain.    OBJECTIVE:   Temp:  [97.5 F (36.4 C)-98.5 F (36.9 C)] 97.5 F (36.4 C) (08/29 0408) Pulse Rate:  [  85] 85 (08/29 0408) Resp:  [18-20] 20 (08/29 0408) BP: (150-166)/(70-88) 166/88 (08/29 0408) SpO2:  [97 %-100 %] 97 % (08/29 0408) Last BM Date : 02/27/23 Physical Exam Constitutional:      General: He is not in acute distress.    Appearance: He is not ill-appearing, toxic-appearing or diaphoretic.  Cardiovascular:     Rate and Rhythm: Normal rate and regular rhythm.  Pulmonary:     Effort: No respiratory distress.     Breath sounds: Normal breath sounds.  Abdominal:      General: Bowel sounds are normal. There is no distension.     Palpations: Abdomen is soft.     Tenderness: There is no abdominal tenderness. There is no guarding.  Neurological:     Mental Status: He is alert.     Labs: Recent Labs    02/26/23 1743 02/26/23 2033 02/27/23 2022 02/28/23 0521 03/01/23 0519  WBC 4.5  --   --  4.6 4.6  HGB 6.4*   < > 7.8* 7.6* 7.7*  HCT 21.7*   < > 24.9* 24.8* 24.9*  PLT 302  --   --  223 266   < > = values in this interval not displayed.   BMET Recent Labs    02/27/23 0527 02/28/23 0521 03/01/23 0520  NA 132* 135 134*  K 3.8 4.7 3.4*  CL 100 103 101  CO2 21* 20* 23  GLUCOSE 85 110* 110*  BUN 9 12 8   CREATININE 0.92 1.01 0.84  CALCIUM 8.6* 8.4* 8.5*   LFT Recent Labs    02/28/23 0521  PROT 6.8  ALBUMIN 2.8*  AST 28  ALT 22  ALKPHOS 70  BILITOT 0.7   PT/INR Recent Labs    02/27/23 0527  LABPROT 17.9*  INR 1.5*   Diagnostic imaging: VAS [place] LOWER EXTREMITY VENOUS (DVT)  Result Date: 02/27/2023  Lower Venous DVT Study Patient Name:  RANDY WHITUS  Date of Exam:   02/27/2023 Medical Rec #: [id number]      Accession #:    [id number] Date of Birth: [birth year]     Patient Gender: M Patient Age:   88 years Exam Location:  [facility] Procedure:      VAS [place] LOWER EXTREMITY VENOUS (DVT) Referring Phys: [name] --------------------------------------------------------------------------------  Indications: Swelling.  Comparison Study: No previous exams Performing Technologist: Jody Hill RVT, RDMS  Examination Guidelines: A complete evaluation includes B-mode imaging, spectral Doppler, color Doppler, and power Doppler as needed of all accessible portions of each vessel. Bilateral testing is considered an integral part of a complete examination. Limited examinations for reoccurring indications may be performed as noted. The reflux portion of the exam is performed with the patient in reverse Trendelenburg.   +---------+---------------+---------+-----------+----------+-------------------+ RIGHT    CompressibilityPhasicitySpontaneityPropertiesThrombus Aging      +---------+---------------+---------+-----------+----------+-------------------+ CFV      Full           Yes      Yes                                      +---------+---------------+---------+-----------+----------+-------------------+ SFJ      Full                                                             +---------+---------------+---------+-----------+----------+-------------------+  FV Prox  Full           Yes      Yes                                      +---------+---------------+---------+-----------+----------+-------------------+ FV Mid   Full           Yes      Yes                                      +---------+---------------+---------+-----------+----------+-------------------+ FV DistalFull           Yes      Yes                                      +---------+---------------+---------+-----------+----------+-------------------+ PFV      Full                                                             +---------+---------------+---------+-----------+----------+-------------------+ POP      Full           Yes      Yes                                      +---------+---------------+---------+-----------+----------+-------------------+ PTV                                                   Not well visualized +---------+---------------+---------+-----------+----------+-------------------+ PERO     Full                                                             +---------+---------------+---------+-----------+----------+-------------------+   +----+---------------+---------+-----------+----------+--------------+ LEFTCompressibilityPhasicitySpontaneityPropertiesThrombus Aging +----+---------------+---------+-----------+----------+--------------+ CFV Full           Yes       Yes                                 +----+---------------+---------+-----------+----------+--------------+     Summary: RIGHT: - There is no evidence of deep vein thrombosis in the lower extremity.  - No cystic structure found in the popliteal fossa.  LEFT: - No evidence of common femoral vein obstruction.   *See table(s) above for measurements and observations. Electronically signed by Waverly Ferrari MD on 02/27/2023 at 3:04:57 PM.    Final     IMPRESSION: Melena, resolved              -EGD 02/27/23 with AVM noted in 3rd portion of duodenum s/p hemoclip placement Iron deficiency anemia Fecal occult positive stool History gastric ulceration History colonic AVM Atrial fibrillation on Xarelto,  last dose 02/26/23 AM Coronary artery disease on clopidogrel, last dose 02/26/23 AM History CVA  PLAN: -Talked to IM physician this AM, plan to resume anticoagulant therapy and trend H/H, if melena recurs or Hgb drops significantly will consider colonoscopy, discussed this plan with patient as well  -Trend H/H, transfuse for Hgb < 7 -Ok for diet today [facility] will follow   LOS: 3 days   Liliane Shi, [facility]

## 2023-03-01 NOTE — Plan of Care (Signed)
  Problem: Education: Goal: Knowledge of General Education information will improve Description: Including pain rating scale, medication(s)/side effects and non-pharmacologic comfort measures Outcome: Progressing   Problem: Health Behavior/Discharge Planning: Goal: Ability to manage health-related needs will improve Outcome: Progressing   Problem: Clinical Measurements: Goal: Diagnostic test results will improve Outcome: Progressing   Problem: Activity: Goal: Risk for activity intolerance will decrease Outcome: Progressing   Problem: Nutrition: Goal: Adequate nutrition will be maintained Outcome: Progressing   Problem: Safety: Goal: Ability to remain free from injury will improve Outcome: Progressing   

## 2023-03-01 NOTE — Plan of Care (Signed)
  Problem: Education: Goal: Knowledge of General Education information will improve Description: Including pain rating scale, medication(s)/side effects and non-pharmacologic comfort measures Outcome: Progressing   Problem: Health Behavior/Discharge Planning: Goal: Ability to manage health-related needs will improve Outcome: Progressing   Problem: Clinical Measurements: Goal: Will remain free from infection Outcome: Progressing Goal: Diagnostic test results will improve Outcome: Progressing   Problem: Nutrition: Goal: Adequate nutrition will be maintained Outcome: Progressing   Problem: Coping: Goal: Level of anxiety will decrease Outcome: Progressing   Problem: Safety: Goal: Ability to remain free from injury will improve Outcome: Progressing   Problem: Skin Integrity: Goal: Risk for impaired skin integrity will decrease Outcome: Progressing

## 2023-03-01 NOTE — Care Management Important Message (Signed)
Important Message  Patient Details IM Letter given. Name: Tristan Gardner MRN: [id number] Date of Birth: 03-26-1935   Medicare Important Message Given:  Yes     Caren Macadam 03/01/2023, 11:59 AM

## 2023-03-02 ENCOUNTER — Inpatient Hospital Stay (HOSPITAL_COMMUNITY): Payer: Medicare PPO

## 2023-03-02 DIAGNOSIS — K921 Melena: Secondary | ICD-10-CM | POA: Diagnosis not present

## 2023-03-02 DIAGNOSIS — D649 Anemia, unspecified: Secondary | ICD-10-CM | POA: Diagnosis not present

## 2023-03-02 LAB — CBC WITH DIFFERENTIAL/PLATELET
Abs Immature Granulocytes: 0.01 10*3/uL (ref 0.00–0.07)
Basophils Absolute: 0 10*3/uL (ref 0.0–0.1)
Basophils Relative: 1 %
Eosinophils Absolute: 0.1 10*3/uL (ref 0.0–0.5)
Eosinophils Relative: 4 %
HCT: 25.4 % — ABNORMAL LOW (ref 39.0–52.0)
Hemoglobin: 7.8 g/dL — ABNORMAL LOW (ref 13.0–17.0)
Immature Granulocytes: 0 %
Lymphocytes Relative: 27 %
Lymphs Abs: 0.9 10*3/uL (ref 0.7–4.0)
MCH: 24.2 pg — ABNORMAL LOW (ref 26.0–34.0)
MCHC: 30.7 g/dL (ref 30.0–36.0)
MCV: 78.9 fL — ABNORMAL LOW (ref 80.0–100.0)
Monocytes Absolute: 0.5 10*3/uL (ref 0.1–1.0)
Monocytes Relative: 14 %
Neutro Abs: 1.9 10*3/uL (ref 1.7–7.7)
Neutrophils Relative %: 54 %
Platelets: 254 10*3/uL (ref 150–400)
RBC: 3.22 MIL/uL — ABNORMAL LOW (ref 4.22–5.81)
RDW: 20.4 % — ABNORMAL HIGH (ref 11.5–15.5)
WBC: 3.4 10*3/uL — ABNORMAL LOW (ref 4.0–10.5)
nRBC: 0 % (ref 0.0–0.2)

## 2023-03-02 LAB — BASIC METABOLIC PANEL
Anion gap: 10 (ref 5–15)
BUN: 7 mg/dL — ABNORMAL LOW (ref 8–23)
CO2: 24 mmol/L (ref 22–32)
Calcium: 8.9 mg/dL (ref 8.9–10.3)
Chloride: 104 mmol/L (ref 98–111)
Creatinine, Ser: 0.92 mg/dL (ref 0.61–1.24)
GFR, Estimated: >60 mL/min (ref >60)
Glucose, Bld: 97 mg/dL (ref 70–99)
Potassium: 4 mmol/L (ref 3.5–5.1)
Sodium: 138 mmol/L (ref 135–145)

## 2023-03-02 LAB — MAGNESIUM: Magnesium: 1.8 mg/dL (ref 1.7–2.4)

## 2023-03-02 MED ORDER — PANTOPRAZOLE SODIUM 40 MG PO TBEC: 40.0000 mg | DELAYED_RELEASE_TABLET | Freq: Two times a day (BID) | ORAL | 0 refills | Status: AC

## 2023-03-02 NOTE — Plan of Care (Signed)
  Problem: Education: Goal: Knowledge of General Education information will improve Description: Including pain rating scale, medication(s)/side effects and non-pharmacologic comfort measures Outcome: Progressing   Problem: Clinical Measurements: Goal: Ability to maintain clinical measurements within normal limits will improve Outcome: Progressing   Problem: Clinical Measurements: Goal: Diagnostic test results will improve Outcome: Progressing   

## 2023-03-02 NOTE — Discharge Summary (Signed)
Physician Discharge Summary   Tristan Gardner ZOX:[id number] DOB: [birth year] DOA: 02/26/2023  PCP: [name], MD  Admit date: 02/26/2023 Discharge date: 03/02/2023   Admitted From: Home Disposition:  Home Discharging physician: Lewie Chamber, MD Barriers to discharge: none  Recommendations at discharge: If recurrent concern for GIB and/or worsened anemia, he is recommended to have colonoscopy 2/2 hx colonic AVM Repeat Hgb at follow up   Discharge Condition: stable CODE STATUS: Full Diet recommendation:  Diet Orders (From admission, onward)     Start     Ordered   03/02/23 0000  Diet general        03/02/23 1222   02/27/23 1613  Diet regular Fluid consistency: Thin  Diet effective now       Question:  Fluid consistency:  Answer:  Thin   02/27/23 1612            Hospital Course: Tristan Gardner is an 88 year old male with past medical history of atrial fibrillation on chronic anticoagulation, CAD, history of GI bleed and multiple other medical issues who presented after syncope and was subsequently found to have significant anemia. He underwent blood transfusions on admission and GI was consulted for further evaluation.  Assessment and Plan:  Acute Blood Loss Anemia Symptomatic anemia  GI bleed Presented with Hb 6.4 g/dL; s/p 2 units PRBC - s/p small bowel enteroscopy 8/27; red blood noted in stomach but no obvious source -Xarelto and Plavix resumed on 03/01/2023 with no further drop in hemoglobin and no reports of rectal bleeding -Hemoglobin stable, 7.8 g/dL at discharge -Repeat hemoglobin at follow-up - GI recommends colonoscopy if recurrent development of GI bleeding or drop in hemoglobin   Syncope and collapse - resolved  -Likely due to above   Atrial fibrillation  -Xarelto resumed 8/29   History of CVA -xarelto resumed 8/29 -Plavix resumed 8/29   Antiplatelet therapy and anticoagulation On plavix and xarelto prior to admission - xarelto for afib - plavix  per daughter is for recent NSTEMI; underwent cath showing nonobstructive CAD and he was transitioned from asa to plavix per daughter - follows with Dr. Malachi Bonds (cardiology)    Essential hypertension BP meds currently on hold due to ABLA   Dyslipidemia -Lipitor   RLE swelling -[place] without DVT   Chronic leg pain with involuntary spasms Peripheral neuropathy -Currently on gabapentin, baclofen, trazodone, and Cymbalta.  Continued -Per patient present since last admission.  Likely related to lumbar cord compression sp laminectomy.  PT outpatient    The patient's acute and chronic medical conditions were treated accordingly. On day of discharge, patient was felt deemed stable for discharge. Patient/family member advised to call PCP or come back to ER if needed.   Principal Diagnosis: Symptomatic anemia  Discharge Diagnoses: Active Hospital Problems   Diagnosis Date Noted   Symptomatic anemia 10/18/2022    Priority: 1.   GI bleed 02/26/2023    Priority: 1.   CAD (coronary artery disease) 02/26/2023    Priority: 2.   H/O: CVA (cerebrovascular accident) 12/24/2022    Priority: 2.   Atrial fibrillation, chronic (HCC) 02/26/2023   Dyslipidemia 12/29/2022   Essential hypertension 12/24/2022    Resolved Hospital Problems   Diagnosis Date Noted Date Resolved   Syncope and collapse 02/26/2023 02/28/2023    Priority: 3.     Discharge Instructions     Diet general   Complete by: As directed    Increase activity slowly   Complete by: As directed  Allergies as of 03/02/2023       Reactions   Iodine Hives, Itching, Rash      Shellfish Allergy Hives        Medication List     TAKE these medications    acetaminophen 325 MG tablet Commonly known as: TYLENOL Take 2 tablets (650 mg total) by mouth every 6 (six) hours as needed for mild pain (or Fever >/= 101).   albuterol 108 (90 Base) MCG/ACT inhaler Commonly known as: VENTOLIN HFA Inhale 2 puffs into the lungs  every 4 (four) hours as needed for wheezing or shortness of breath.   atorvastatin 40 MG tablet Commonly known as: LIPITOR Take 40 mg by mouth at bedtime.   baclofen 10 MG tablet Commonly known as: LIORESAL Take 10 mg by mouth 3 (three) times daily.   benzocaine 10 % mucosal gel Commonly known as: ORAJEL Use as directed in the mouth or throat 4 (four) times daily as needed for mouth pain.   clopidogrel 75 MG tablet Commonly known as: PLAVIX Take 1 tablet (75 mg total) by mouth daily. START ON 7/10   DULoxetine 30 MG capsule Commonly known as: CYMBALTA Take 30 mg by mouth 2 (two) times daily.   gabapentin 300 MG capsule Commonly known as: NEURONTIN Take 300 mg by mouth 3 (three) times daily.   hydrALAZINE 50 MG tablet Commonly known as: APRESOLINE Take 1 tablet (50 mg total) by mouth every 8 (eight) hours.   IRON PO Take 1 tablet by mouth daily.   isosorbide mononitrate 30 MG 24 hr tablet Commonly known as: IMDUR Take 30 mg by mouth daily.   losartan 25 MG tablet Commonly known as: COZAAR Take 1 tablet (25 mg total) by mouth daily.   metoprolol succinate 50 MG 24 hr tablet Commonly known as: TOPROL-XL Take 50 mg by mouth daily.   oxyCODONE 5 MG immediate release tablet Commonly known as: Oxy IR/ROXICODONE Take 1 tablet (5 mg total) by mouth every 6 (six) hours as needed for moderate pain or severe pain.   pantoprazole 40 MG tablet Commonly known as: Protonix Take 1 tablet (40 mg total) by mouth 2 (two) times daily before a meal.   traZODone 50 MG tablet Commonly known as: DESYREL Take 50 mg by mouth at bedtime.   Xarelto 20 MG Tabs tablet Generic drug: rivaroxaban Take 1 tablet (20 mg total) by mouth every morning. START ON 7/10        Allergies  Allergen Reactions   Iodine Hives, Itching and Rash         Shellfish Allergy Hives    Consultations: GI  Procedures: 02/27/2023: EGD with AVM noted in third portion of duodenum s/p Hemoclip  placement  Discharge Exam: BP 136/82 (BP Location: Left Arm)   Pulse 83   Temp 98 F (36.7 C) (Oral)   Resp 16   Ht 5\' 11"  (1.803 m)   Wt 120.2 kg   SpO2 99%   BMI 36.96 kg/m  Physical Exam Constitutional:      General: He is not in acute distress.    Appearance: Normal appearance.     Comments: Hard of hearing  HENT:     Head: Normocephalic and atraumatic.     Mouth/Throat:     Mouth: Mucous membranes are moist.  Eyes:     Extraocular Movements: Extraocular movements intact.  Cardiovascular:     Rate and Rhythm: Normal rate and regular rhythm.  Pulmonary:     Effort: Pulmonary effort  is normal. No respiratory distress.     Breath sounds: Normal breath sounds. No wheezing.  Abdominal:     General: Bowel sounds are normal. There is no distension.     Palpations: Abdomen is soft.     Tenderness: There is no abdominal tenderness.  Musculoskeletal:        General: Normal range of motion.     Cervical back: Normal range of motion and neck supple.  Skin:    General: Skin is warm and dry.  Neurological:     General: No focal deficit present.     Mental Status: He is alert.  Psychiatric:        Mood and Affect: Mood normal.        Behavior: Behavior normal.      The results of significant diagnostics from this hospitalization (including imaging, microbiology, ancillary and laboratory) are listed below for reference.   Microbiology: No results found for this or any previous visit (from the past 240 hour(s)).   Labs: BNP (last 3 results) No results for input(s): "BNP" in the last 8760 hours. Basic Metabolic Panel: Recent Labs  Lab 02/26/23 1743 02/27/23 0527 02/28/23 0521 03/01/23 0520 03/02/23 0535  NA 135 132* 135 134* 138  K 3.9 3.8 4.7 3.4* 4.0  CL 102 100 103 101 104  CO2 23 21* 20* 23 24  GLUCOSE 149* 85 110* 110* 97  BUN 10 9 12 8  7*  CREATININE 1.07 0.92 1.01 0.84 0.92  CALCIUM 8.7* 8.6* 8.4* 8.5* 8.9  MG  --   --  2.0 1.8 1.8  PHOS  --   --  3.6   --   --    Liver Function Tests: Recent Labs  Lab 02/28/23 0521  AST 28  ALT 22  ALKPHOS 70  BILITOT 0.7  PROT 6.8  ALBUMIN 2.8*   No results for input(s): "LIPASE", "AMYLASE" in the last 168 hours. No results for input(s): "AMMONIA" in the last 168 hours. CBC: Recent Labs  Lab 02/26/23 1743 02/26/23 2033 02/27/23 1351 02/27/23 2022 02/28/23 0521 03/01/23 0519 03/02/23 0535  WBC 4.5  --   --   --  4.6 4.6 3.4*  NEUTROABS 2.9  --   --   --  2.9  --  1.9  HGB 6.4*   < > 7.4* 7.8* 7.6* 7.7* 7.8*  HCT 21.7*   < > 23.6* 24.9* 24.8* 24.9* 25.4*  MCV 78.1*  --   --   --  80.3 77.8* 78.9*  PLT 302  --   --   --  223 266 254   < > = values in this interval not displayed.   Cardiac Enzymes: No results for input(s): "CKTOTAL", "CKMB", "CKMBINDEX", "TROPONINI" in the last 168 hours. BNP: Invalid input(s): "POCBNP" CBG: No results for input(s): "GLUCAP" in the last 168 hours. D-Dimer No results for input(s): "DDIMER" in the last 72 hours. Hgb A1c No results for input(s): "HGBA1C" in the last 72 hours. Lipid Profile No results for input(s): "CHOL", "HDL", "LDLCALC", "TRIG", "CHOLHDL", "LDLDIRECT" in the last 72 hours. Thyroid function studies No results for input(s): "TSH", "T4TOTAL", "T3FREE", "THYROIDAB" in the last 72 hours.  Invalid input(s): "FREET3" Anemia work up No results for input(s): "VITAMINB12", "FOLATE", "FERRITIN", "TIBC", "IRON", "RETICCTPCT" in the last 72 hours. Urinalysis    Component Value Date/Time   COLORURINE STRAW (A) 02/26/2023 2029   APPEARANCEUR CLEAR 02/26/2023 2029   LABSPEC 1.004 (L) 02/26/2023 2029   PHURINE 6.0 02/26/2023 2029  GLUCOSEU NEGATIVE 02/26/2023 2029   HGBUR NEGATIVE 02/26/2023 2029   BILIRUBINUR NEGATIVE 02/26/2023 2029   KETONESUR NEGATIVE 02/26/2023 2029   PROTEINUR NEGATIVE 02/26/2023 2029   NITRITE NEGATIVE 02/26/2023 2029   LEUKOCYTESUR NEGATIVE 02/26/2023 2029   Sepsis Labs Recent Labs  Lab 02/26/23 1743  02/28/23 0521 03/01/23 0519 03/02/23 0535  WBC 4.5 4.6 4.6 3.4*   Microbiology No results found for this or any previous visit (from the past 240 hour(s)).  Procedures/Studies: DG Shoulder Right  Result Date: 03/02/2023 CLINICAL DATA:  Fall prior to ER admission 02/26/2023. Right shoulder pain. EXAM: RIGHT SHOULDER - 2+ VIEW COMPARISON:  None Available. FINDINGS: Frontal and transscapular Y-views of the right shoulder. Moderate acromioclavicular joint space narrowing and peripheral osteophytosis. Moderate inferior glenoid and mild to moderate inferior humeral head-neck junction degenerative osteophytosis. Moderate distal lateral subacromial spurring suggested on frontal view. No acute fracture or dislocation. The visualized portion of the right lung is unremarkable. IMPRESSION: 1. 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 2. Moderate distal lateral subacromial spurring suggested on frontal view. Electronically Signed   By: Neita Garnet M.D.   On: 03/02/2023 12:29   VAS [place] LOWER EXTREMITY VENOUS (DVT)  Result Date: 02/27/2023  Lower Venous DVT Study Patient Name:  Tristan Gardner  Date of Exam:   02/27/2023 Medical Rec #: [id number]      Accession #:    [id number] Date of Birth: October 18, 1934     Patient Gender: M Patient Age:   43 years Exam Location[facility] Procedure:      VAS [place] LOWER EXTREMITY VENOUS (DVT) Referring Phys: [name] --------------------------------------------------------------------------------  Indications: Swelling.  Comparison Study: No previous exams Performing Technologist: Jody Hill RVT, RDMS  Examination Guidelines: A complete evaluation includes B-mode imaging, spectral Doppler, color Doppler, and power Doppler as needed of all accessible portions of each vessel. Bilateral testing is considered an integral part of a complete examination. Limited examinations for reoccurring indications may be performed as noted. The reflux  portion of the exam is performed with the patient in reverse Trendelenburg.  +---------+---------------+---------+-----------+----------+-------------------+ RIGHT    CompressibilityPhasicitySpontaneityPropertiesThrombus Aging      +---------+---------------+---------+-----------+----------+-------------------+ CFV      Full           Yes      Yes                                      +---------+---------------+---------+-----------+----------+-------------------+ SFJ      Full                                                             +---------+---------------+---------+-----------+----------+-------------------+ FV Prox  Full           Yes      Yes                                      +---------+---------------+---------+-----------+----------+-------------------+ FV Mid   Full           Yes      Yes                                      +---------+---------------+---------+-----------+----------+-------------------+  FV DistalFull           Yes      Yes                                      +---------+---------------+---------+-----------+----------+-------------------+ PFV      Full                                                             +---------+---------------+---------+-----------+----------+-------------------+ POP      Full           Yes      Yes                                      +---------+---------------+---------+-----------+----------+-------------------+ PTV                                                   Not well visualized +---------+---------------+---------+-----------+----------+-------------------+ PERO     Full                                                             +---------+---------------+---------+-----------+----------+-------------------+   +----+---------------+---------+-----------+----------+--------------+ LEFTCompressibilityPhasicitySpontaneityPropertiesThrombus Aging  +----+---------------+---------+-----------+----------+--------------+ CFV Full           Yes      Yes                                 +----+---------------+---------+-----------+----------+--------------+     Summary: RIGHT: - There is no evidence of deep vein thrombosis in the lower extremity.  - No cystic structure found in the popliteal fossa.  LEFT: - No evidence of common femoral vein obstruction.   *See table(s) above for measurements and observations. Electronically signed by Waverly Ferrari MD on 02/27/2023 at 3:04:57 PM.    Final      Time coordinating discharge: Over 30 minutes    Lewie Chamber, MD  [facility] 03/02/2023, 3:02 PM

## 2023-03-02 NOTE — TOC Transition Note (Signed)
Transition of Care [facility]) - CM/SW Discharge Note   Patient Details  Name: Tristan Gardner MRN: [id number] Date of Birth: 03/04/1935  [facility] CM/SW Contact:  Adrian Prows, RN Phone Number: 03/02/2023, 12:30 PM   Clinical Narrative:    D/C orders received; pt has HHPT/OT w/ [facility]; notified Kelly at agency of pt's d/c; no TOC needs.    Final next level of care: Home w Home Health Services Barriers to Discharge: No Barriers Identified   Patient Goals and CMS Choice      Discharge Placement                         Discharge Plan and Services Additional resources added to the After Visit Summary for                      Date DME Agency Contacted: 03/02/23 Time DME Agency Contacted: 1229 Representative spoke with at DME Agency: Tresa Endo HH Arranged: PT, OT (resumption of HHPT/OT)          Social Determinants of Health (SDOH) Interventions SDOH Screenings   Food Insecurity: No Food Insecurity (02/26/2023)  Housing: Low Risk  (02/26/2023)  Transportation Needs: No Transportation Needs (02/26/2023)  Utilities: Not At Risk (02/26/2023)  Financial Resource Strain: Low Risk  (02/13/2023)   Received from [facility]  Physical Activity: Insufficiently Active (02/24/2020)   Received from [facility], [facility]  Social Connections: Unknown (11/03/2021)   [facility] from [facility], [facility]  Stress: Stress Concern Present (02/24/2020)   Received from [facility], [facility]  Tobacco Use: Medium Risk (02/27/2023)     Readmission Risk Interventions    02/28/2023   11:02 AM  Readmission Risk Prevention Plan  Transportation Screening Complete  PCP or Specialist Appt within 3-5 Days Complete  HRI or Home Care Consult Complete  Social Work Consult for Recovery Care Planning/Counseling Complete  Palliative Care Screening Not Applicable  Medication Review [employer]) Complete

## 2023-03-02 NOTE — Progress Notes (Signed)
[facility] Progress Note  SUBJECTIVE:   Interval history: Tristan Gardner was seen and evaluated today at bedside. Resting in chair at bedside. Denied chest pain, shortness of breath, abdominal pain. Having right shoulder pain. Had brown BM yesterday evening. Hgb stable. Anticoagulant and anti-platelet has been resumed.   Past Medical History:  Diagnosis Date   Atrial fibrillation [facility])    Coronary artery disease    Hypertension    NSTEMI (non-ST elevated myocardial infarction) (HCC)    Stroke [facility])    Past Surgical History:  Procedure Laterality Date   BIOPSY  10/23/2022   Procedure: BIOPSY;  Surgeon: [name], MD;  Location: [facility] ENDOSCOPY;  Service: Gastroenterology;;   CARDIAC SURGERY     COLONOSCOPY WITH PROPOFOL N/A 10/23/2022   Procedure: COLONOSCOPY WITH PROPOFOL;  Surgeon: [name], MD;  Location: [facility] ENDOSCOPY;  Service: Gastroenterology;  Laterality: N/A;   ENTEROSCOPY N/A 02/27/2023   Procedure: ENTEROSCOPY;  Surgeon: Lynann Bologna, DO;  Location: [facility] ENDOSCOPY;  Service: Gastroenterology;  Laterality: N/A;   ESOPHAGOGASTRODUODENOSCOPY (EGD) WITH PROPOFOL N/A 10/23/2022   Procedure: ESOPHAGOGASTRODUODENOSCOPY (EGD) WITH PROPOFOL;  Surgeon: [name], MD;  Location: [facility] ENDOSCOPY;  Service: Gastroenterology;  Laterality: N/A;   HEMOSTASIS CLIP PLACEMENT  02/27/2023   Procedure: HEMOSTASIS CLIP PLACEMENT;  Surgeon: Lynann Bologna, DO;  Location: [facility] ENDOSCOPY;  Service: Gastroenterology;;   IR INJECT/THERA/INC NEEDLE/CATH/PLC EPI/LUMB/SAC W/IMG  12/27/2022   LUMBAR LAMINECTOMY/DECOMPRESSION MICRODISCECTOMY N/A 01/03/2023   Procedure: Lumbar Two-Three, Lumbar Three-Four, Lumbar Four-Five Open laminectomies;  Surgeon: [name], MD;  Location: [facility] OR;  Service: Neurosurgery;  Laterality: N/A;   RADIOLOGY WITH ANESTHESIA N/A 12/27/2022   Procedure: TOTAL SPINE MYELOGRAM;  Surgeon: Radiologist, Medication, MD;  Location: [facility] OR;  Service:  Radiology;  Laterality: N/A;   ROTATOR CUFF REPAIR     Current Facility-Administered Medications  Medication Dose Route Frequency Provider Last Rate Last Admin   acetaminophen (TYLENOL) tablet 650 mg  650 mg Oral Q6H PRN Lynann Bologna, DO   650 mg at 03/01/23 2212   Or   acetaminophen (TYLENOL) suppository 650 mg  650 mg Rectal Q6H PRN Lynann Bologna, DO       clopidogrel (PLAVIX) tablet 75 mg  75 mg Oral Daily Lewie Chamber, MD   75 mg at 03/02/23 1020   methocarbamol (ROBAXIN) tablet 500 mg  500 mg Oral Q8H PRN Lewie Chamber, MD   500 mg at 03/01/23 2212   ondansetron (ZOFRAN) tablet 4 mg  4 mg Oral Q6H PRN Lynann Bologna, DO       Or   ondansetron [facility]) injection 4 mg  4 mg Intravenous Q6H PRN Liliane Shi H, DO   4 mg at 03/01/23 1150   pantoprazole (PROTONIX) injection 40 mg  40 mg Intravenous Q12H Liliane Shi H, DO   40 mg at 03/02/23 1020   rivaroxaban (XARELTO) tablet 20 mg  20 mg Oral Q lunch Lewie Chamber, MD   20 mg at 03/01/23 1210   Allergies as of 02/26/2023 - Review Complete 02/26/2023  Allergen Reaction Noted   Iodine Hives, Itching, and Rash 02/24/2011   Shellfish allergy Hives 05/17/2020   Review of Systems:  Review of Systems  Respiratory:  Negative for shortness of breath.   Cardiovascular:  Negative for chest pain.  Gastrointestinal:  Negative for abdominal pain.    OBJECTIVE:   Temp:  [98 F (36.7 C)-98.8 F (37.1 C)] 98 F (36.7 C) (08/30 0424) Pulse Rate:  [83-101] 83 (08/30  0424) Resp:  [16-20] 16 (08/30 0424) BP: (127-139)/(78-86) 136/82 (08/30 0424) SpO2:  [96 %-99 %] 99 % (08/30 0424) Last BM Date : 03/01/23 Physical Exam Constitutional:      General: He is not in acute distress.    Appearance: He is not ill-appearing, toxic-appearing or diaphoretic.  Cardiovascular:     Rate and Rhythm: Normal rate. Rhythm irregular.  Pulmonary:     Effort: No respiratory distress.     Breath sounds: Normal breath sounds.   Abdominal:     General: Bowel sounds are normal. There is no distension.     Palpations: Abdomen is soft.     Tenderness: There is no abdominal tenderness. There is no guarding.  Neurological:     Mental Status: He is alert.     Labs: Recent Labs    02/28/23 0521 03/01/23 0519 03/02/23 0535  WBC 4.6 4.6 3.4*  HGB 7.6* 7.7* 7.8*  HCT 24.8* 24.9* 25.4*  PLT 223 266 254   BMET Recent Labs    02/28/23 0521 03/01/23 0520 03/02/23 0535  NA 135 134* 138  K 4.7 3.4* 4.0  CL 103 101 104  CO2 20* 23 24  GLUCOSE 110* 110* 97  BUN 12 8 7*  CREATININE 1.01 0.84 0.92  CALCIUM 8.4* 8.5* 8.9   LFT Recent Labs    02/28/23 0521  PROT 6.8  ALBUMIN 2.8*  AST 28  ALT 22  ALKPHOS 70  BILITOT 0.7   PT/INR No results for input(s): "LABPROT", "INR" in the last 72 hours. Diagnostic imaging: No results found.  IMPRESSION: Melena, resolved              -EGD 02/27/23 with AVM noted in 3rd portion of duodenum s/p hemoclip placement Iron deficiency anemia, stable Fecal occult positive stool History gastric ulceration History colonic AVM Atrial fibrillation on Xarelto, resumed with no sign of recurrent GI bleeding  Coronary artery disease on clopidogrel, resumed with no sign of recurrent GI bleeding  History CVA  PLAN: -No further signs of GI bleeding, having brown bowel movements, anticoagulation and antiplatelet medications have been resumed, Hgb stable  -Ok for diet as tolerated -Trend H/H, recommend trend H/H in outpatient setting this next week -If anemia persists, recommend colonoscopy given history of colonic AVM  -Discussed with IM, patient appears eager for discharge  -[facility] will sign off and be available for any questions or concerns   LOS: 4 days   Liliane Shi, DO [facility]

## 2023-03-16 ENCOUNTER — Other Ambulatory Visit: Payer: Medicare PPO

## 2023-03-20 DIAGNOSIS — E538 Deficiency of other specified B group vitamins: Secondary | ICD-10-CM | POA: Insufficient documentation

## 2023-03-20 NOTE — Progress Notes (Deleted)
[facility] CONSULT NOTE  Patient Care Team: [name], MD as PCP - General (Internal Medicine)  ASSESSMENT & PLAN:  Iron deficiency anemia Venofer weekly x 4 Repeat labs with CBC and ferritin in Dec.  Low serum vitamin B12 Start b12 500 mcg daily Repeat labs with CBC and B12 in Dec.  Folate deficiency Repeat CBC and folate with other labs in Dec.  No orders of the defined types were placed in this encounter. 87 y.o.m with history of atrial fibrillation on chronic anticoagulation, CAD, history of GI bleed, HTN referred to hematology oncology clinic for anemia  Cbc with blood smear. Iron, TIBC, ferritin, iron saturation, B12, folate Reticulocyte count Haptoglobin, CMP for renal function and bilirubin, LDH SPEP, serum free light chain, immunoglobulins Referral for colonoscopy  MCV greater than 100 CBC with blood smear B12, folate, copper Reticulocytes  LFT, TSH  MCV less than 80 CBC with blood smear Iron, TIBC, ferritin, iron saturation  All questions were answered. The patient knows to call the clinic with any problems, questions or concerns.  The total time spent in the appointment was {CHL ONC TIME VISIT - UVOZD:[id number]} encounter with patients including review of chart and various tests results, discussions about plan of care and coordination of care plan  [name], MD 9/17/202411:01 AM   CHIEF COMPLAINTS/PURPOSE OF CONSULTATION:  Anemia  HISTORY OF PRESENTING ILLNESS:  Tristan Gardner 88 y.o. male is here because of anemia.  He presented to hospital in August with hemoglobin of 6.4 after a syncopal event.  History reportedly he has atrial fibrillation and CAD and is on anticoagulation with Xarelto, and Plavix.  EGD and colonoscopy completed 10/23/2022 (Dr. [name]) for iron deficiency anemia and heme positive stool showed normal esophagus, 2 non-bleeding superficial gastric antrum ulcers, normal duodenum, and there was scattered erosions and  erythema throughout the antrum and distal body.  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  While inpatient, he was found to have iron deficiency, folate deficiency and low B12.   MEDICAL HISTORY:  Past Medical History:  Diagnosis Date   Atrial fibrillation [facility])    Coronary artery disease    Hypertension    NSTEMI (non-ST elevated myocardial infarction) (HCC)    Stroke [facility])     SURGICAL HISTORY: Past Surgical History:  Procedure Laterality Date   BIOPSY  10/23/2022   Procedure: BIOPSY;  Surgeon: [name], MD;  Location: [facility] ENDOSCOPY;  Service: Gastroenterology;;   CARDIAC SURGERY     COLONOSCOPY WITH PROPOFOL N/A 10/23/2022   Procedure: COLONOSCOPY WITH PROPOFOL;  Surgeon: [name], MD;  Location: [facility] ENDOSCOPY;  Service: Gastroenterology;  Laterality: N/A;   ENTEROSCOPY N/A 02/27/2023   Procedure: ENTEROSCOPY;  Surgeon: Lynann Bologna, DO;  Location: [facility] ENDOSCOPY;  Service: Gastroenterology;  Laterality: N/A;   ESOPHAGOGASTRODUODENOSCOPY (EGD) WITH PROPOFOL N/A 10/23/2022   Procedure: ESOPHAGOGASTRODUODENOSCOPY (EGD) WITH PROPOFOL;  Surgeon: [name], MD;  Location: [facility] ENDOSCOPY;  Service: Gastroenterology;  Laterality: N/A;   HEMOSTASIS CLIP PLACEMENT  02/27/2023   Procedure: HEMOSTASIS CLIP PLACEMENT;  Surgeon: Lynann Bologna, DO;  Location: [facility] ENDOSCOPY;  Service: Gastroenterology;;   IR INJECT/THERA/INC NEEDLE/CATH/PLC EPI/LUMB/SAC W/IMG  12/27/2022   LUMBAR LAMINECTOMY/DECOMPRESSION MICRODISCECTOMY N/A 01/03/2023   Procedure: Lumbar Two-Three, Lumbar Three-Four, Lumbar Four-Five Open laminectomies;  Surgeon: [name], MD;  Location: [facility] OR;  Service: Neurosurgery;  Laterality: N/A;   RADIOLOGY WITH ANESTHESIA N/A 12/27/2022   Procedure: TOTAL SPINE MYELOGRAM;  Surgeon: Radiologist,  Medication, MD;  Location: [facility] OR;  Service: Radiology;  Laterality: N/A;   ROTATOR CUFF REPAIR      SOCIAL  HISTORY: Social History   Socioeconomic History   Marital status: Married    Spouse name: Not on file   Number of children: Not on file   Years of education: Not on file   Highest education level: Not on file  Occupational History   Not on file  Tobacco Use   Smoking status: Former   Smokeless tobacco: Never  Vaping Use   Vaping status: Never Used  Substance and Sexual Activity   Alcohol use: No   Drug use: Never   Sexual activity: Not Currently  Other Topics Concern   Not on file  Social History Narrative   Not on file   Social Determinants of Health   Financial Resource Strain: Low Risk  (02/13/2023)   Received from [facility]   Overall Financial Resource Strain (CARDIA)    Difficulty of Paying Living Expenses: Not very hard  Food Insecurity: No Food Insecurity (02/26/2023)   Hunger Vital Sign    Worried About Running Out of Food in the Last Year: Never true    Ran Out of Food in the Last Year: Never true  Transportation Needs: No Transportation Needs (02/26/2023)   PRAPARE - Administrator, Civil Service (Medical): No    Lack of Transportation (Non-Medical): No  Physical Activity: Insufficiently Active (02/24/2020)   Received from [facility], [facility]   Exercise Vital Sign    Days of Exercise per Week: 2 days    Minutes of Exercise per Session: 10 min  Stress: Stress Concern Present (02/24/2020)   Received from [facility], [facility] - Occupational Stress Questionnaire    Feeling of Stress : Very much  Social Connections: Unknown (11/03/2021)   Received from [facility], [facility]   Social Network    Social Network: Not on file  Intimate Partner Violence: Not At Risk (02/26/2023)   Humiliation, Afraid, Rape, and Kick questionnaire    Fear of Current or Ex-Partner: No    Emotionally Abused: No    Physically Abused: No    Sexually Abused: No    FAMILY HISTORY: Family History  Problem  Relation Age of Onset   Diabetes Mother    Heart attack Father 30   Hypertension Brother    Heart disease Brother    Diabetes Brother     ALLERGIES:  is allergic to iodine and shellfish allergy.  MEDICATIONS:  Current Outpatient Medications  Medication Sig Dispense Refill   acetaminophen (TYLENOL) 325 MG tablet Take 2 tablets (650 mg total) by mouth every 6 (six) hours as needed for mild pain (or Fever >/= 101).     albuterol (VENTOLIN HFA) 108 (90 Base) MCG/ACT inhaler Inhale 2 puffs into the lungs every 4 (four) hours as needed for wheezing or shortness of breath.     atorvastatin (LIPITOR) 40 MG tablet Take 40 mg by mouth at bedtime.     baclofen (LIORESAL) 10 MG tablet Take 10 mg by mouth 3 (three) times daily.     benzocaine (ORAJEL) 10 % mucosal gel Use as directed in the mouth or throat 4 (four) times daily as needed for mouth pain. 5.3 g 0   clopidogrel (PLAVIX) 75 MG tablet Take 1 tablet (75 mg total) by mouth daily. START ON 7/10     DULoxetine (CYMBALTA) 30 MG capsule Take  30 mg by mouth 2 (two) times daily.     Ferrous Sulfate (IRON PO) Take 1 tablet by mouth daily.     gabapentin (NEURONTIN) 300 MG capsule Take 300 mg by mouth 3 (three) times daily.     hydrALAZINE (APRESOLINE) 50 MG tablet Take 1 tablet (50 mg total) by mouth every 8 (eight) hours.     isosorbide mononitrate (IMDUR) 30 MG 24 hr tablet Take 30 mg by mouth daily.     losartan (COZAAR) 25 MG tablet Take 1 tablet (25 mg total) by mouth daily.     metoprolol succinate (TOPROL-XL) 50 MG 24 hr tablet Take 50 mg by mouth daily.     oxyCODONE (OXY IR/ROXICODONE) 5 MG immediate release tablet Take 1 tablet (5 mg total) by mouth every 6 (six) hours as needed for moderate pain or severe pain. 15 tablet 0   pantoprazole (PROTONIX) 40 MG tablet Take 1 tablet (40 mg total) by mouth 2 (two) times daily before a meal. 120 tablet 0   traZODone (DESYREL) 50 MG tablet Take 50 mg by mouth at bedtime.     XARELTO 20 MG TABS  tablet Take 1 tablet (20 mg total) by mouth every morning. START ON 7/10 30 tablet    No current facility-administered medications for this visit.    REVIEW OF SYSTEMS:   Constitutional: Denies fevers, chills or abnormal night sweats Eyes: Denies blurriness of vision, double vision or watery eyes Ears, nose, mouth, throat, and face: Denies mucositis or sore throat Respiratory: Denies cough, dyspnea or wheezes Cardiovascular: Denies palpitation, chest discomfort or lower extremity swelling Gastrointestinal:  Denies nausea, heartburn or change in bowel habits Skin: Denies abnormal skin rashes Lymphatics: Denies new lymphadenopathy or easy bruising Neurological:Denies numbness, tingling or new weaknesses Behavioral/Psych: Mood is stable, no new changes  All other systems were reviewed with the patient and are negative.  PHYSICAL EXAMINATION: ECOG PERFORMANCE STATUS: {CHL ONC ECOG [phone]}  There were no vitals filed for this visit. There were no vitals filed for this visit.  GENERAL:alert, no distress and comfortable SKIN: skin color, texture, turgor are normal, no rashes or significant lesions EYES: normal, conjunctiva are pink and non-injected, sclera clear OROPHARYNX:no exudate, no erythema and lips, buccal mucosa, and tongue normal  NECK: supple, thyroid normal size, non-tender, without nodularity LYMPH:  no palpable lymphadenopathy in the cervical, axillary or inguinal LUNGS: clear to auscultation and percussion with normal breathing effort HEART: regular rate & rhythm and no murmurs and no lower extremity edema ABDOMEN:abdomen soft, non-tender and normal bowel sounds Musculoskeletal:no cyanosis of digits and no clubbing  PSYCH: alert & oriented x 3 with fluent speech NEURO: no focal motor/sensory deficits  RADIOGRAPHIC STUDIES: I have personally reviewed the radiological images as listed and agreed with the findings in the report. DG Shoulder Right  Result Date:  03/02/2023 CLINICAL DATA:  Fall prior to ER admission 02/26/2023. Right shoulder pain. EXAM: RIGHT SHOULDER - 2+ VIEW COMPARISON:  None Available. FINDINGS: Frontal and transscapular Y-views of the right shoulder. Moderate acromioclavicular joint space narrowing and peripheral osteophytosis. Moderate inferior glenoid and mild to moderate inferior humeral head-neck junction degenerative osteophytosis. Moderate distal lateral subacromial spurring suggested on frontal view. No acute fracture or dislocation. The visualized portion of the right lung is unremarkable. IMPRESSION: 1. 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 2. Moderate distal lateral subacromial spurring suggested on frontal view. Electronically Signed   By: Neita Garnet M.D.   On: 03/02/2023 12:29   VAS [place] LOWER  EXTREMITY VENOUS (DVT)  Result Date: 02/27/2023  Lower Venous DVT Study Patient Name:  DREU TISCHNER  Date of Exam:   02/27/2023 Medical Rec #: [id number]      Accession #:    [id number] Date of Birth: 1934/08/02     Patient Gender: M Patient Age:   81 years Exam Location:  [facility] Procedure:      VAS [place] LOWER EXTREMITY VENOUS (DVT) Referring Phys: [name] --------------------------------------------------------------------------------  Indications: Swelling.  Comparison Study: No previous exams Performing Technologist: Jody Hill RVT, RDMS  Examination Guidelines: A complete evaluation includes B-mode imaging, spectral Doppler, color Doppler, and power Doppler as needed of all accessible portions of each vessel. Bilateral testing is considered an integral part of a complete examination. Limited examinations for reoccurring indications may be performed as noted. The reflux portion of the exam is performed with the patient in reverse Trendelenburg.  +---------+---------------+---------+-----------+----------+-------------------+ RIGHT     CompressibilityPhasicitySpontaneityPropertiesThrombus Aging      +---------+---------------+---------+-----------+----------+-------------------+ CFV      Full           Yes      Yes                                      +---------+---------------+---------+-----------+----------+-------------------+ SFJ      Full                                                             +---------+---------------+---------+-----------+----------+-------------------+ FV Prox  Full           Yes      Yes                                      +---------+---------------+---------+-----------+----------+-------------------+ FV Mid   Full           Yes      Yes                                      +---------+---------------+---------+-----------+----------+-------------------+ FV DistalFull           Yes      Yes                                      +---------+---------------+---------+-----------+----------+-------------------+ PFV      Full                                                             +---------+---------------+---------+-----------+----------+-------------------+ POP      Full           Yes      Yes                                      +---------+---------------+---------+-----------+----------+-------------------+  PTV                                                   Not well visualized +---------+---------------+---------+-----------+----------+-------------------+ PERO     Full                                                             +---------+---------------+---------+-----------+----------+-------------------+   +----+---------------+---------+-----------+----------+--------------+ LEFTCompressibilityPhasicitySpontaneityPropertiesThrombus Aging +----+---------------+---------+-----------+----------+--------------+ CFV Full           Yes      Yes                                  +----+---------------+---------+-----------+----------+--------------+     Summary: RIGHT: - There is no evidence of deep vein thrombosis in the lower extremity.  - No cystic structure found in the popliteal fossa.  LEFT: - No evidence of common femoral vein obstruction.   *See table(s) above for measurements and observations. Electronically signed by Waverly Ferrari MD on 02/27/2023 at 3:04:57 PM.    Final

## 2023-03-20 NOTE — Assessment & Plan Note (Deleted)
Start b12 500 mcg daily Repeat labs with CBC and B12 in Dec.

## 2023-03-20 NOTE — Assessment & Plan Note (Deleted)
Venofer weekly x 4 Repeat labs with CBC and ferritin in Dec.

## 2023-03-20 NOTE — Assessment & Plan Note (Deleted)
Repeat CBC and folate with other labs in Dec.

## 2023-03-21 ENCOUNTER — Telehealth: Payer: Self-pay | Admitting: *Deleted

## 2023-03-21 NOTE — Telephone Encounter (Signed)
Informed by Estevan Ryder, NT/Nurse secretary in [facility] infusion room: Patient's SNF contacted infusion desk and cancelled his appts for tomorrow 03/22/23 and stated they will contact [facility] to reschedule

## 2023-03-22 ENCOUNTER — Inpatient Hospital Stay: Payer: Medicare (Managed Care)

## 2023-03-22 DIAGNOSIS — E538 Deficiency of other specified B group vitamins: Secondary | ICD-10-CM

## 2023-03-22 DIAGNOSIS — D508 Other iron deficiency anemias: Secondary | ICD-10-CM

## 2023-03-26 ENCOUNTER — Other Ambulatory Visit (HOSPITAL_COMMUNITY): Payer: Self-pay | Admitting: *Deleted

## 2023-03-28 ENCOUNTER — Encounter (HOSPITAL_COMMUNITY): Payer: Self-pay

## 2023-03-28 ENCOUNTER — Encounter (HOSPITAL_COMMUNITY): Payer: Medicare (Managed Care)

## 2023-04-02 ENCOUNTER — Encounter (HOSPITAL_COMMUNITY)
Admission: RE | Admit: 2023-04-02 | Discharge: 2023-04-02 | Disposition: A | Discharge status: Home / Self Care | Payer: Medicare (Managed Care) | Source: Ambulatory Visit | Attending: Internal Medicine | Admitting: Internal Medicine

## 2023-04-02 DIAGNOSIS — K922 Gastrointestinal hemorrhage, unspecified: Secondary | ICD-10-CM | POA: Diagnosis present

## 2023-04-02 DIAGNOSIS — D500 Iron deficiency anemia secondary to blood loss (chronic): Secondary | ICD-10-CM | POA: Insufficient documentation

## 2023-04-02 MED ORDER — SODIUM CHLORIDE 0.9 % IV SOLN
510.0000 mg | INTRAVENOUS | Status: DC
  Administered 2023-04-02: 510 mg via INTRAVENOUS
  Filled 2023-04-02: qty 510

## 2023-04-04 ENCOUNTER — Encounter (HOSPITAL_COMMUNITY): Payer: Medicare (Managed Care)

## 2023-04-09 ENCOUNTER — Encounter (HOSPITAL_COMMUNITY)
Admission: RE | Admit: 2023-04-09 | Discharge: 2023-04-09 | Disposition: A | Discharge status: Home / Self Care | Payer: Medicare (Managed Care) | Source: Ambulatory Visit | Attending: Internal Medicine | Admitting: Internal Medicine

## 2023-04-09 DIAGNOSIS — I484 Atypical atrial flutter: Secondary | ICD-10-CM | POA: Diagnosis present

## 2023-04-09 DIAGNOSIS — Z862 Personal history of diseases of the blood and blood-forming organs and certain disorders involving the immune mechanism: Secondary | ICD-10-CM | POA: Insufficient documentation

## 2023-04-09 DIAGNOSIS — I77810 Thoracic aortic ectasia: Secondary | ICD-10-CM | POA: Diagnosis present

## 2023-04-09 DIAGNOSIS — D500 Iron deficiency anemia secondary to blood loss (chronic): Secondary | ICD-10-CM | POA: Insufficient documentation

## 2023-04-09 MED ORDER — SODIUM CHLORIDE 0.9 % IV SOLN
510.0000 mg | INTRAVENOUS | Status: DC
  Administered 2023-04-09: 510 mg via INTRAVENOUS
  Filled 2023-04-09: qty 510

## 2023-04-23 ENCOUNTER — Inpatient Hospital Stay: Payer: Medicare (Managed Care) | Attending: Hematology and Oncology | Admitting: Hematology and Oncology

## 2023-04-23 ENCOUNTER — Telehealth: Payer: Self-pay

## 2023-04-23 ENCOUNTER — Inpatient Hospital Stay: Payer: Medicare (Managed Care)

## 2023-04-23 NOTE — Telephone Encounter (Signed)
Spoke with granddaughter [name].  She stated she wasn't aware of the appointment today.. was aware of the one on the 29th.  I gave her number [phone] to call and reschedule the visit.  She confirmed and understood.

## 2023-05-01 ENCOUNTER — Encounter: Payer: Self-pay | Admitting: Cardiology

## 2023-05-01 ENCOUNTER — Encounter (INDEPENDENT_AMBULATORY_CARE_PROVIDER_SITE_OTHER): Payer: Medicare (Managed Care) | Admitting: Cardiology

## 2023-05-01 VITALS — BP 123/66 | HR 72 | Resp 16 | Ht 71.0 in | Wt 244.2 lb

## 2023-05-01 DIAGNOSIS — I484 Atypical atrial flutter: Secondary | ICD-10-CM | POA: Diagnosis not present

## 2023-05-01 DIAGNOSIS — D500 Iron deficiency anemia secondary to blood loss (chronic): Secondary | ICD-10-CM | POA: Diagnosis not present

## 2023-05-01 DIAGNOSIS — I77810 Thoracic aortic ectasia: Secondary | ICD-10-CM | POA: Insufficient documentation

## 2023-05-01 DIAGNOSIS — Z862 Personal history of diseases of the blood and blood-forming organs and certain disorders involving the immune mechanism: Secondary | ICD-10-CM | POA: Diagnosis not present

## 2023-05-01 NOTE — Patient Instructions (Signed)
Medication Instructions:   STOP TAKING PLAVIX NOW  STOP TAKING HYDRALAZINE NOW  *If you need a refill on your cardiac medications before your next appointment, please call your pharmacy*   You have been referred to SEE OUR ELECTROPHYSIOLOGIST DR. [name] FOR CONSIDERATION OF WATCHMAN PROCEDURE    Testing/Procedures:  Your physician has requested that you have an echocardiogram. Echocardiography is a painless test that uses sound waves to create images of your heart. It provides your doctor with information about the size and shape of your heart and how well your heart's chambers and valves are working. This procedure takes approximately one hour. There are no restrictions for this procedure.  SCHEDULE ECHO TO BE DONE IN ONE YEAR PER DR. PATWARDHAN  Please do NOT wear cologne, perfume, aftershave, or lotions (deodorant is allowed). Please arrive 15 minutes prior to your appointment time.    Follow-Up: At [facility], you and your health needs are our priority.  As part of our continuing mission to provide you with exceptional heart care, we have created designated Provider Care Teams.  These Care Teams include your primary Cardiologist (physician) and Advanced Practice Providers (APPs -  Physician Assistants and Nurse Practitioners) who all work together to provide you with the care you need, when you need it.  We recommend signing up for the patient portal called "MyChart".  Sign up information is provided on this After Visit Summary.  MyChart is used to connect with patients for Virtual Visits (Telemedicine).  Patients are able to view lab/test results, encounter notes, upcoming appointments, etc.  Non-urgent messages can be sent to your provider as well.   To learn more about what you can do with MyChart, go to [email or web address].    Your next appointment:   1 year(s)  Provider:   DR. Rosemary Holms

## 2023-05-01 NOTE — Progress Notes (Signed)
Cardiology Office Note:  .   Date:  05/01/2023  ID:  Tristan Gardner, DOB January 05, 1935, MRN [id number] PCP: Tristan Northern, MD  Tristan Gardner Providers Cardiologist:  Tristan Mainland, MD PCP: Tristan Northern, MD  Chief Complaint  Patient presents with   Atrial Fibrillation   Atrial Flutter   New Patient (Initial Visit)      History of Present Illness: .    Tristan Gardner is a 88 y.o. male with rate controlled A-fib/flutter, chronic blood loss anemia with iron deficiency, h/o stroke  Patient was last seen by Dr. Jens Gardner in 03/2020. Patient lives with his granddaughter Tristan Gardner who is here today.  He denies any complaints of chest pain, shortness of breath, orthopnea, PND, leg edema to me.  I reviewed his chart extensively.  He was recently hospitalized with blood loss anemia requiring blood transfusions.  Acute blood loss anemia was thought to be reason for his syncope.  He had GI workup including enteroscopy that showed red blood noted in stomach but no obvious source.  He was referred to allergy but has missed some appointments.  Currently, he is on Xarelto 10 mg daily, along with Plavix 75 mg daily reportedly for prior history of stroke.  Patient does not recollect any prior stroke personally.     Vitals:   05/01/23 1525  BP: 123/66  Pulse: 72  Resp: 16  SpO2: 97%     ROS:  Review of Systems  Cardiovascular:  Negative for chest pain, dyspnea on exertion, leg swelling, palpitations and syncope.     Studies Reviewed: Marland Kitchen       EKG 05/01/2023: Atrial flutter with 4:1 A-V conduction When compared with ECG of 26-Feb-2023 17:36, Atrial flutter has replaced sinus or ectopic atrial rhythm  Labs 02/2023: Last known hemoglobin 7.8 GFR >60  Echocardiogram 12/25/2022: 1. Left ventricular ejection fraction, by estimation, is 50 to 55%. The  left ventricle has low normal function. The left ventricle has no regional  wall motion abnormalities. There is moderate concentric left  ventricular  hypertrophy. Left ventricular  diastolic function could not be evaluated.   2. Right ventricular systolic function was not well visualized. The right  ventricular size is not well visualized. There is normal pulmonary artery  systolic pressure.   3. Left atrial size was mild to moderately dilated.   4. The mitral valve is normal in structure. Mild mitral valve  regurgitation. No evidence of mitral stenosis.   5. The aortic valve is tricuspid. There is mild calcification of the  aortic valve. There is mild thickening of the aortic valve. Aortic valve  regurgitation is not visualized.   6. Aortic dilatation noted. There is mild dilatation of the ascending  aorta, measuring 40 mm.   7. The inferior vena cava is normal in size with greater than 50%  respiratory variability, suggesting right atrial pressure of 3 mmHg.   Comparison(s): No significant change from prior study.      Risk Assessment/Calculations:    CHA2DS2-VASc Score = 5   This indicates a 7.2 % annual risk of stroke. The patient's score is based upon:     Physical Exam:   Physical Exam Vitals and nursing note reviewed.  Constitutional:      General: He is not in acute distress. Neck:     Vascular: No JVD.  Cardiovascular:     Rate and Rhythm: Normal rate and regular rhythm.     Heart sounds: Normal heart sounds. No murmur heard. Pulmonary:  Effort: Pulmonary effort is normal.     Breath sounds: Normal breath sounds. No wheezing or rales.  Musculoskeletal:     Right lower leg: No edema.     Left lower leg: No edema.      VISIT DIAGNOSES:   ICD-10-CM   1. Atypical atrial flutter (HCC)  I48.4 EKG 12-Lead    ECHOCARDIOGRAM COMPLETE    Ambulatory referral to Cardiac Electrophysiology    2. H/O iron deficiency anemia  Z86.2 Ambulatory referral to Cardiac Electrophysiology    3. Blood loss anemia  D50.0 Ambulatory referral to Cardiac Electrophysiology    4. Aortic root dilatation (HCC)   I77.810        ASSESSMENT AND PLAN: .    Tristan Gardner is a 88 y.o. male with rate controlled A-fib/flutter, chronic blood loss anemia with iron deficiency, h/o stroke  A-fib/flutter: Longstanding history, rate is controlled.  No associated symptoms. He is currently on Xarelto 20 mg daily, and surprisingly, also on Plavix 75 mg daily for prior history of stroke. I have stopped his Plavix, as I do not see any definite indication. I remain concerned regarding his ongoing Xarelto use and anemia requiring blood transfusions, without clear cause. I will refer him to EP for evaluation for left atrial appendage closure.  Tristan Gardner Referral for Left Atrial Appendage Closure with Non-Valvular Atrial Fibrillation   Tristan Gardner is a 88 y.o. male is being referred to the [facility] Team for evaluation for Left Atrial Appendage Closure with Watchman device for the management of stroke risk resulting form non-valvular atrial fibrillation.    Base upon Mr. Goguen history, he is felt to be a poor candidate for long-term anticoagulation because of a history of bleeding (e.g. intracerebral, subdural, GI, retro-peritoneal).  The patient has a HAS-BLED score of 3  indicating a Yearly Major Bleeding Risk of 3.7 %.    His CHADS2-VASc Score is 5 with an unadjusted Ischemic Stroke Rate (% per year) of 7.2%.    His stroke risk necessitates a strategy of stroke prevention with either long-term oral anticoagulation or left atrial appendage occlusion therapy. We have discussed their bleeding risk in the context of their comorbid medical problems, as well as the rationale for referral for evaluation of Watchman left atrial appendage occlusion therapy. While the patient is at high long-term bleeding risk, they may be appropriate for short-term anticoagulation. Based on this individual patient's stroke and bleeding risk, a shared decision has been made to refer the patient for consideration of  Watchman left atrial appendage closure utilizing the [employer] of Cardiology shared decision tool.  Hypertension: 97/62 mmHg today. I have stopped his hydralazine.  Continue rest of the antihypertensive therapy. If SBP consistently runs >140 mmHg, can consider either resuming hydralazine, or adding amlodipine.  Aortic root dilatation: 40 mm.  Heart rate and blood pressure control.  Will repeat echocardiogram in 1 year.   F/u in 1 year w.repeat echocardiogram  Signed, [name], MD

## 2023-05-04 ENCOUNTER — Telehealth: Payer: Self-pay | Admitting: Cardiology

## 2023-05-04 NOTE — Telephone Encounter (Signed)
Thank you for following up. Needs Xarelto 20 mg daily. Does not need Plavix.  Thanks MJP

## 2023-05-04 NOTE — Telephone Encounter (Signed)
Crystal Lamb from [facility] called and wanted to verify that Dr. Rosemary Holms wants to d/c patient taking Plavix and have him taking Xarelto

## 2023-05-04 NOTE — Telephone Encounter (Signed)
Called Crystal with [facility] back and verified that patient should be on xarelto and plavix should be discontinued. They did not have patient on xarelto, but they will get the patient his xarelto, so he can get back on it. Will send message to Dr. Rosemary Holms, so he is aware.

## 2023-05-17 NOTE — Progress Notes (Signed)
[facility] CONSULT NOTE  Patient Care Team: [name], MD as PCP - General (Internal Medicine)  ASSESSMENT & PLAN:  88 y.o.male with history of atrial fibrillation, CAD, stroke being seen for iron deficiency anemia.  Previously he was on both antiplatelet and anticoagulation.  Currently he is on the anticoagulation.  Relevant history: History of both antiplatelet and anticoagulation. Last colonoscopy: 2024  Last EGD: 2024  The mechanism of IDA is due to either blood loss or decreased absorptive mechanism or both. We discussed some of the risks, benefits, and alternatives of intravenous iron infusions.  Some of the side-effects to be expected including risks of infusion reactions, phlebitis, headaches, nausea and fatigue.  The patient is willing to proceed if needed.  B12 deficiency Will start b12 1000 mcg daily Follow up with labs on 12/20 and appointment  IDA Will order iv iron if indicated CBC, Iron, TIBC, ferritin Repeat labs in 3 months.  Folate deficiency Continue folic acid 1 mg daily  All questions were answered. The patient knows to call the clinic with any problems, questions or concerns.  [name], MD 11/15/20244:47 PM   CHIEF COMPLAINTS/PURPOSE OF CONSULTATION:  Anemia  HISTORY OF PRESENTING ILLNESS:  Tristan Gardner 88 y.o. male is here because of anemia.  He has history of atrial fibrillation on chronic anticoagulation.  02/2023 he presented with symptomatic anemia, syncope, hemoglobin was 6.4.  Reported previously was also on Plavix for history of stroke.  Ferritin was 8 in August 2024 with microcytic anemia.  B12 was low.  10/23/22 colonoscopy The perianal and digital rectal examinations were normal. A large amount of liquid semi- liquid stool was found in the entire colon, interfering with visualization. Small, medium, and flat lesions could not be excluded due to the remaining stool. A single medium- sized angiodysplastic lesion  without bleeding was found in the proximal ascending colon. The exam was otherwise without abnormality on direct and retroflexion views.  EGD - Normal esophagus. - Non- bleeding gastric ulcers with no stigmata of bleeding. Biopsied. - Gastropathy and gastric erosions. - Normal examined duodenum.  02/27/23 Small bowel enteroscopy Red blood was found in the cardia. The entire examined stomach was normal. A single angiodysplastic lesion with no bleeding was found in the third portion of the duodenum. For hemostasis, one hemostatic clip was successfully placed. There was no bleeding at the end of the procedure. There was no evidence of significant pathology at 140 cm ( from the incisors)  He was on plavix until discontinued when saw cardiologist.  MEDICAL HISTORY:  Past Medical History:  Diagnosis Date   Atrial fibrillation [facility])    Coronary artery disease    Hypertension    NSTEMI (non-ST elevated myocardial infarction) (HCC)    Stroke [facility])     SURGICAL HISTORY: Past Surgical History:  Procedure Laterality Date   BIOPSY  10/23/2022   Procedure: BIOPSY;  Surgeon: [name], MD;  Location: [facility] ENDOSCOPY;  Service: Gastroenterology;;   CARDIAC SURGERY     COLONOSCOPY WITH PROPOFOL N/A 10/23/2022   Procedure: COLONOSCOPY WITH PROPOFOL;  Surgeon: [name], MD;  Location: [facility] ENDOSCOPY;  Service: Gastroenterology;  Laterality: N/A;   ENTEROSCOPY N/A 02/27/2023   Procedure: ENTEROSCOPY;  Surgeon: Lynann Bologna, DO;  Location: [facility] ENDOSCOPY;  Service: Gastroenterology;  Laterality: N/A;   ESOPHAGOGASTRODUODENOSCOPY (EGD) WITH PROPOFOL N/A 10/23/2022   Procedure: ESOPHAGOGASTRODUODENOSCOPY (EGD) WITH PROPOFOL;  Surgeon: [name], MD;  Location: [facility] ENDOSCOPY;  Service: Gastroenterology;  Laterality: N/A;  HEMOSTASIS CLIP PLACEMENT  02/27/2023   Procedure: HEMOSTASIS CLIP PLACEMENT;  Surgeon: Lynann Bologna, DO;  Location: [facility] ENDOSCOPY;  Service: Gastroenterology;;    IR INJECT/THERA/INC NEEDLE/CATH/PLC EPI/LUMB/SAC W/IMG  12/27/2022   LUMBAR LAMINECTOMY/DECOMPRESSION MICRODISCECTOMY N/A 01/03/2023   Procedure: Lumbar Two-Three, Lumbar Three-Four, Lumbar Four-Five Open laminectomies;  Surgeon: [name], MD;  Location: [facility] OR;  Service: Neurosurgery;  Laterality: N/A;   RADIOLOGY WITH ANESTHESIA N/A 12/27/2022   Procedure: TOTAL SPINE MYELOGRAM;  Surgeon: Radiologist, Medication, MD;  Location: [facility] OR;  Service: Radiology;  Laterality: N/A;   ROTATOR CUFF REPAIR      SOCIAL HISTORY: Social History   Socioeconomic History   Marital status: Married    Spouse name: Not on file   Number of children: Not on file   Years of education: Not on file   Highest education level: Not on file  Occupational History   Not on file  Tobacco Use   Smoking status: Former   Smokeless tobacco: Never  Vaping Use   Vaping status: Never Used  Substance and Sexual Activity   Alcohol use: No   Drug use: Never   Sexual activity: Not Currently  Other Topics Concern   Not on file  Social History Narrative   Not on file   Social Determinants of Health   Financial Resource Strain: Low Risk  (02/13/2023)   Received from [facility]   Overall Financial Resource Strain (CARDIA)    Difficulty of Paying Living Expenses: Not very hard  Food Insecurity: No Food Insecurity (02/26/2023)   Hunger Vital Sign    Worried About Running Out of Food in the Last Year: Never true    Ran Out of Food in the Last Year: Never true  Transportation Needs: No Transportation Needs (02/26/2023)   PRAPARE - Administrator, Civil Service (Medical): No    Lack of Transportation (Non-Medical): No  Physical Activity: Insufficiently Active (02/24/2020)   Received from [facility], [facility]   Exercise Vital Sign    Days of Exercise per Week: 2 days    Minutes of Exercise per Session: 10 min  Stress: Stress Concern Present (02/24/2020)   Received from [facility],  [facility] - Occupational Stress Questionnaire    Feeling of Stress : Very much  Social Connections: Unknown (11/03/2021)   Received from [facility], [facility]   Social Network    Social Network: Not on file  Intimate Partner Violence: Not At Risk (02/26/2023)   Humiliation, Afraid, Rape, and Kick questionnaire    Fear of Current or Ex-Partner: No    Emotionally Abused: No    Physically Abused: No    Sexually Abused: No    FAMILY HISTORY: Family History  Problem Relation Age of Onset   Diabetes Mother    Heart attack Father 19   Hypertension Brother    Heart disease Brother    Diabetes Brother     ALLERGIES:  is allergic to iodine and shellfish allergy.  MEDICATIONS:  Current Outpatient Medications  Medication Sig Dispense Refill   acetaminophen (TYLENOL) 325 MG tablet Take 2 tablets (650 mg total) by mouth every 6 (six) hours as needed for mild pain (or Fever >/= 101).     albuterol (VENTOLIN HFA) 108 (90 Base) MCG/ACT inhaler Inhale 2 puffs into the lungs every 4 (four) hours as needed for wheezing or shortness of breath.     atorvastatin (LIPITOR) 40 MG tablet Take  40 mg by mouth at bedtime.     baclofen (LIORESAL) 10 MG tablet Take 10 mg by mouth 3 (three) times daily.     benzocaine (ORAJEL) 10 % mucosal gel Use as directed in the mouth or throat 4 (four) times daily as needed for mouth pain. 5.3 g 0   DULoxetine (CYMBALTA) 30 MG capsule Take 30 mg by mouth 2 (two) times daily.     gabapentin (NEURONTIN) 300 MG capsule Take 300 mg by mouth 3 (three) times daily.     isosorbide mononitrate (IMDUR) 30 MG 24 hr tablet Take 30 mg by mouth daily.     losartan (COZAAR) 25 MG tablet Take 1 tablet (25 mg total) by mouth daily.     metoprolol succinate (TOPROL-XL) 50 MG 24 hr tablet Take 50 mg by mouth daily.     pantoprazole (PROTONIX) 40 MG tablet Take 1 tablet (40 mg total) by mouth 2 (two) times daily before a meal. 120 tablet  0   traZODone (DESYREL) 50 MG tablet Take 50 mg by mouth at bedtime.     XARELTO 20 MG TABS tablet Take 1 tablet (20 mg total) by mouth every morning. START ON 7/10 30 tablet    No current facility-administered medications for this visit.    REVIEW OF SYSTEMS:   Respiratory: Denies shortness of breath Cardiovascular: Denies chest pain, chest discomfort Gastrointestinal:  Denies nausea, abdominal pain, melena or bloody stools GU: no hematuria Skin: skin color change All other systems were reviewed with the patient and are negative.  PHYSICAL EXAMINATION:  Vitals:   05/18/23 1120  BP: (!) 131/57  Pulse: 61  Resp: 18  Temp: 98.1 F (36.7 C)  SpO2: 100%   Filed Weights   05/18/23 1120  Weight: 248 lb 4.8 oz (112.6 kg)    GENERAL: alert, no distress and comfortable on wheelchair  SKIN: skin color normal EYES: normal conjunctiva, sclera clear LUNGS: normal breathing effort HEART: regular rate & rhythm ABDOMEN: abdomen soft, non-tender and nondistended  RADIOGRAPHIC STUDIES: I have personally reviewed the radiological images as listed and agreed with the findings in the report. No results found.

## 2023-05-18 ENCOUNTER — Inpatient Hospital Stay (HOSPITAL_BASED_OUTPATIENT_CLINIC_OR_DEPARTMENT_OTHER): Payer: Medicare (Managed Care)

## 2023-05-18 ENCOUNTER — Inpatient Hospital Stay: Payer: Medicare (Managed Care) | Attending: Hematology and Oncology

## 2023-05-18 VITALS — BP 131/57 | HR 61 | Temp 98.1°F | Resp 18 | Ht 71.0 in | Wt 248.3 lb

## 2023-05-18 DIAGNOSIS — I4891 Unspecified atrial fibrillation: Secondary | ICD-10-CM | POA: Insufficient documentation

## 2023-05-18 DIAGNOSIS — Z87891 Personal history of nicotine dependence: Secondary | ICD-10-CM

## 2023-05-18 DIAGNOSIS — D6489 Other specified anemias: Secondary | ICD-10-CM

## 2023-05-18 DIAGNOSIS — E538 Deficiency of other specified B group vitamins: Secondary | ICD-10-CM | POA: Diagnosis not present

## 2023-05-18 DIAGNOSIS — D509 Iron deficiency anemia, unspecified: Secondary | ICD-10-CM

## 2023-05-18 DIAGNOSIS — D500 Iron deficiency anemia secondary to blood loss (chronic): Secondary | ICD-10-CM

## 2023-05-18 LAB — CBC WITH DIFFERENTIAL (CANCER CENTER ONLY)
Abs Immature Granulocytes: 0 10*3/uL (ref 0.00–0.07)
Basophils Absolute: 0 10*3/uL (ref 0.0–0.1)
Basophils Relative: 0 %
Eosinophils Absolute: 0.1 10*3/uL (ref 0.0–0.5)
Eosinophils Relative: 3 %
HCT: 28.8 % — ABNORMAL LOW (ref 39.0–52.0)
Hemoglobin: 9.3 g/dL — ABNORMAL LOW (ref 13.0–17.0)
Immature Granulocytes: 0 %
Lymphocytes Relative: 28 %
Lymphs Abs: 0.9 10*3/uL (ref 0.7–4.0)
MCH: 28.3 pg (ref 26.0–34.0)
MCHC: 32.3 g/dL (ref 30.0–36.0)
MCV: 87.5 fL (ref 80.0–100.0)
Monocytes Absolute: 0.4 10*3/uL (ref 0.1–1.0)
Monocytes Relative: 15 %
Neutro Abs: 1.6 10*3/uL — ABNORMAL LOW (ref 1.7–7.7)
Neutrophils Relative %: 54 %
Platelet Count: 201 10*3/uL (ref 150–400)
RBC: 3.29 MIL/uL — ABNORMAL LOW (ref 4.22–5.81)
RDW: 23.6 % — ABNORMAL HIGH (ref 11.5–15.5)
WBC Count: 3 10*3/uL — ABNORMAL LOW (ref 4.0–10.5)
nRBC: 0 % (ref 0.0–0.2)

## 2023-05-18 LAB — IRON AND IRON BINDING CAPACITY (CC-WL,HP ONLY)
Iron: 75 ug/dL (ref 45–182)
Saturation Ratios: 22 % (ref 17.9–39.5)
TIBC: 336 ug/dL (ref 250–450)
UIBC: 261 ug/dL (ref 117–376)

## 2023-05-18 LAB — FERRITIN: Ferritin: 26 ng/mL (ref 24–336)

## 2023-05-18 LAB — VITAMIN B12: Vitamin B-12: 167 pg/mL — ABNORMAL LOW (ref 180–914)

## 2023-05-18 LAB — FOLATE: Folate: 7.4 ng/mL (ref >5.9)

## 2023-05-20 ENCOUNTER — Emergency Department (HOSPITAL_COMMUNITY): Payer: Medicare (Managed Care)

## 2023-05-20 ENCOUNTER — Other Ambulatory Visit: Payer: Self-pay

## 2023-05-20 ENCOUNTER — Observation Stay (HOSPITAL_COMMUNITY)
Admission: EM | Admit: 2023-05-20 | Discharge: 2023-05-21 | Disposition: A | Discharge status: Home / Self Care | Payer: Medicare (Managed Care) | Attending: Internal Medicine | Admitting: Internal Medicine

## 2023-05-20 ENCOUNTER — Encounter (HOSPITAL_COMMUNITY): Payer: Self-pay

## 2023-05-20 DIAGNOSIS — Z7901 Long term (current) use of anticoagulants: Secondary | ICD-10-CM | POA: Insufficient documentation

## 2023-05-20 DIAGNOSIS — Z8673 Personal history of transient ischemic attack (TIA), and cerebral infarction without residual deficits: Secondary | ICD-10-CM | POA: Diagnosis not present

## 2023-05-20 DIAGNOSIS — R778 Other specified abnormalities of plasma proteins: Secondary | ICD-10-CM | POA: Diagnosis not present

## 2023-05-20 DIAGNOSIS — R0602 Shortness of breath: Secondary | ICD-10-CM | POA: Diagnosis not present

## 2023-05-20 DIAGNOSIS — R2689 Other abnormalities of gait and mobility: Secondary | ICD-10-CM | POA: Insufficient documentation

## 2023-05-20 DIAGNOSIS — R2681 Unsteadiness on feet: Secondary | ICD-10-CM | POA: Diagnosis not present

## 2023-05-20 DIAGNOSIS — I10 Essential (primary) hypertension: Secondary | ICD-10-CM | POA: Diagnosis not present

## 2023-05-20 DIAGNOSIS — Z87891 Personal history of nicotine dependence: Secondary | ICD-10-CM | POA: Insufficient documentation

## 2023-05-20 DIAGNOSIS — G629 Polyneuropathy, unspecified: Secondary | ICD-10-CM

## 2023-05-20 DIAGNOSIS — M6281 Muscle weakness (generalized): Secondary | ICD-10-CM | POA: Diagnosis not present

## 2023-05-20 DIAGNOSIS — G9009 Other idiopathic peripheral autonomic neuropathy: Secondary | ICD-10-CM | POA: Insufficient documentation

## 2023-05-20 DIAGNOSIS — I482 Chronic atrial fibrillation, unspecified: Secondary | ICD-10-CM | POA: Diagnosis present

## 2023-05-20 DIAGNOSIS — D509 Iron deficiency anemia, unspecified: Secondary | ICD-10-CM | POA: Diagnosis present

## 2023-05-20 DIAGNOSIS — R7989 Other specified abnormal findings of blood chemistry: Secondary | ICD-10-CM | POA: Insufficient documentation

## 2023-05-20 DIAGNOSIS — R079 Chest pain, unspecified: Secondary | ICD-10-CM | POA: Diagnosis not present

## 2023-05-20 DIAGNOSIS — D500 Iron deficiency anemia secondary to blood loss (chronic): Secondary | ICD-10-CM | POA: Insufficient documentation

## 2023-05-20 DIAGNOSIS — R0789 Other chest pain: Principal | ICD-10-CM | POA: Insufficient documentation

## 2023-05-20 DIAGNOSIS — Z79899 Other long term (current) drug therapy: Secondary | ICD-10-CM | POA: Insufficient documentation

## 2023-05-20 DIAGNOSIS — I2510 Atherosclerotic heart disease of native coronary artery without angina pectoris: Secondary | ICD-10-CM | POA: Diagnosis present

## 2023-05-20 DIAGNOSIS — I160 Hypertensive urgency: Secondary | ICD-10-CM | POA: Diagnosis present

## 2023-05-20 LAB — CBC
HCT: 31.2 % — ABNORMAL LOW (ref 39.0–52.0)
Hemoglobin: 9.5 g/dL — ABNORMAL LOW (ref 13.0–17.0)
MCH: 27.8 pg (ref 26.0–34.0)
MCHC: 30.4 g/dL (ref 30.0–36.0)
MCV: 91.2 fL (ref 80.0–100.0)
Platelets: 195 10*3/uL (ref 150–400)
RBC: 3.42 MIL/uL — ABNORMAL LOW (ref 4.22–5.81)
RDW: 23.8 % — ABNORMAL HIGH (ref 11.5–15.5)
WBC: 3.2 10*3/uL — ABNORMAL LOW (ref 4.0–10.5)
nRBC: 0 % (ref 0.0–0.2)

## 2023-05-20 LAB — TROPONIN I (HIGH SENSITIVITY)
Troponin I (High Sensitivity): 81 ng/L — ABNORMAL HIGH (ref <18)
Troponin I (High Sensitivity): 89 ng/L — ABNORMAL HIGH (ref <18)

## 2023-05-20 LAB — BASIC METABOLIC PANEL
Anion gap: 10 (ref 5–15)
BUN: 10 mg/dL (ref 8–23)
CO2: 23 mmol/L (ref 22–32)
Calcium: 8.7 mg/dL — ABNORMAL LOW (ref 8.9–10.3)
Chloride: 104 mmol/L (ref 98–111)
Creatinine, Ser: 0.9 mg/dL (ref 0.61–1.24)
GFR, Estimated: >60 mL/min (ref >60)
Glucose, Bld: 106 mg/dL — ABNORMAL HIGH (ref 70–99)
Potassium: 3.8 mmol/L (ref 3.5–5.1)
Sodium: 137 mmol/L (ref 135–145)

## 2023-05-20 LAB — PROTIME-INR
INR: 1.1 (ref 0.8–1.2)
Prothrombin Time: 14.7 s (ref 11.4–15.2)

## 2023-05-20 LAB — BRAIN NATRIURETIC PEPTIDE: B Natriuretic Peptide: 314.7 pg/mL — ABNORMAL HIGH (ref 0.0–100.0)

## 2023-05-20 MED ORDER — ALBUTEROL SULFATE (2.5 MG/3ML) 0.083% IN NEBU
2.5000 mg | INHALATION_SOLUTION | Freq: Four times a day (QID) | RESPIRATORY_TRACT | Status: DC | PRN
  Administered 2023-05-20: 2.5 mg via RESPIRATORY_TRACT
  Filled 2023-05-20: qty 3

## 2023-05-20 MED ORDER — ACETAMINOPHEN 325 MG PO TABS
650.0000 mg | ORAL_TABLET | Freq: Four times a day (QID) | ORAL | Status: DC | PRN
  Administered 2023-05-20: 650 mg via ORAL
  Filled 2023-05-20: qty 2

## 2023-05-20 MED ORDER — ACETAMINOPHEN 650 MG RE SUPP: 650.0000 mg | Freq: Four times a day (QID) | RECTAL | Status: DC | PRN

## 2023-05-20 MED ORDER — ISOSORBIDE MONONITRATE ER 30 MG PO TB24
30.0000 mg | ORAL_TABLET | Freq: Every day | ORAL | Status: DC
  Administered 2023-05-20 – 2023-05-21 (×2): 30 mg via ORAL
  Filled 2023-05-20 (×2): qty 1

## 2023-05-20 MED ORDER — METOPROLOL TARTRATE 5 MG/5ML IV SOLN: 5.0000 mg | Freq: Four times a day (QID) | INTRAVENOUS | Status: DC | PRN

## 2023-05-20 MED ORDER — LOSARTAN POTASSIUM 50 MG PO TABS
25.0000 mg | ORAL_TABLET | Freq: Every day | ORAL | Status: DC
  Administered 2023-05-20 – 2023-05-21 (×2): 25 mg via ORAL
  Filled 2023-05-20 (×2): qty 1

## 2023-05-20 MED ORDER — FUROSEMIDE 10 MG/ML IJ SOLN
20.0000 mg | Freq: Once | INTRAMUSCULAR | Status: AC
  Administered 2023-05-20: 20 mg via INTRAVENOUS
  Filled 2023-05-20: qty 2

## 2023-05-20 NOTE — ED Triage Notes (Signed)
Patient arrived by [facility] from home with complaint of chest pain. Patient received 4 baby aspirin and 1 sl ntg prior to arrival with pain going from a 9 to a 3 following ntg. Has hx of atrial fib, alert and oriented. Reports some sob with same

## 2023-05-20 NOTE — Assessment & Plan Note (Signed)
CHA2DS2-VASc of 5   Continue xarelto and toprol-xl  Has seen cardiology and been referred for a watchman due to ongoing bleeds on xarelto

## 2023-05-20 NOTE — Consult Note (Signed)
   Cardiology Consultation   Patient ID: Decklan Escalante MRN: [id number]; DOB: Sep 24, 1934  Admit date: 05/20/2023 Date of Consult: 05/20/2023  PCP:  [name], MD   History of Present Illness:   Mr. Meske is a pleasant 88yo man who I am seeing today for an evaluation of chest pain at the request of Dr Jeraldine Loots. The patient has a history of AF on Xarelto, anemia requiring blood transfusion and aortic root dilation. He has previously seen Dr Rosemary Holms in clinic. His anemia is significant and has required previous blood transfusions. He was referred for watchman consideration in the past by Dr Rosemary Holms. He has an appointment next month with Dr Jimmey Ralph to discuss.  Today he reports waking up with chest pain. It was in the center-left of his chest and sharp in quality. It was nonradiating. No n/v. He thinks there may have been associated shortness of breath. The pain resolved once he got to the hospital. No recent illnesses or changes to medications that he is aware of.   Past medical, surgical, social and family history reviewed.  ROS:  Please see the history of present illness.  All other ROS reviewed and negative.     Physical Exam/Data:   Vitals:   05/20/23 1100 05/20/23 1130 05/20/23 1335 05/20/23 1440  BP: (!) 158/97 (!) 127/97 (!) 180/110 (!) 170/103  Pulse: (!) 57 (!) 59 80 78  Resp: 17 20 (!) 22 20  Temp:      TempSrc:      SpO2: 95% 100% 98% 99%    General:  Elderly, no distress in bed at 15 degrees. HOH Cardiac:  regular rhythm, no significant murmur. Warm extremities. Trace bilateral pitting edema Lungs:  clear to auscultation bilaterally, no wheezing, rhonchi or rales  Psych:  Normal affect   EKG:  The EKG was personally reviewed and demonstrates:  atrial flutter. V rate 60.  Telemetry:  Telemetry was personally reviewed and demonstrates:  atrial flutter with variable AV conduction    Assessment and Plan:   #Chest pain Atypical chest pain with a mildly  elevated but stable troponin. This troponin is similar to previous checks. His story and findings thus far are not consistent with ACS. I suspect his pain may be noncardiac. His mild troponin elevation is likely due to significant hypertension on arrival today.  Recommend optimization of HTN regimen.   Recommend coronary CTA to further assess coronary anatomy prior to discharge (ordered).   #AF #AFL #Anemia Chronic. Controlled ventricular rates. Currently on xarelto and tolerating it with stable (although abnormal) Hgb. He has an appointment with Dr Jimmey Ralph (EP) for [facility] consideration next month.   Sheria Lang T. Lalla Brothers, MD, [facility], [facility] Cardiac Electrophysiology

## 2023-05-20 NOTE — Assessment & Plan Note (Addendum)
Plavix stopped 10/29 by cardiology  On xarelto/statin

## 2023-05-20 NOTE — Assessment & Plan Note (Signed)
 Continue cymbalta and gabapentin.

## 2023-05-20 NOTE — H&P (Signed)
History and Physical    Patient: Tristan Gardner UJW:[id number] DOB: August 29, 1934 DOA: 05/20/2023 DOS: the patient was seen and examined on 05/20/2023 PCP: [name], MD  Patient coming from: Home - lives with his granddaughter. Uses walker.    Chief Complaint: chest pain   HPI: Tristan Gardner is a 88 y.o. male with medical history significant of atrial fibrillation on xarelto, CAD, hx of CVA, HTN, HLD, hx of GI bleed (last in 02/2023) here with complaints of chest pain.  He states he had chest pain this morning that woke him up. Pain was in the middle of his chest and into left chest wall.  He states it radiated just a little bit into his left arm. Pain rated as a 8/10 and described as sharp. Pain lasted a few hours. He had associated shortness of breath, no palpitations or light headedness. Denies diaphoresis. He has no pain at this time.    Denies any fever/chills, vision changes/headaches, palpitations, shortness of breath or cough, abdominal pain, N/V/D, dysuria or leg swelling.   He does not smoke or drink alcohol.   ER Course:  vitals: afebrile, bp: 153/82, HR: 57, RR: 22, oxygen: 99%RA Pertinent labs: hgb: 9.5, wbc: 3.2, troponin 81>89,  CXR: no acute findings.  In ED: cardiology consulted. TRH asked to admit.    Review of Systems: As mentioned in the history of present illness. All other systems reviewed and are negative. Past Medical History:  Diagnosis Date   Atrial fibrillation [facility])    Coronary artery disease    Hypertension    NSTEMI (non-ST elevated myocardial infarction) (HCC)    Stroke [facility])    Past Surgical History:  Procedure Laterality Date   BIOPSY  10/23/2022   Procedure: BIOPSY;  Surgeon: [name], MD;  Location: [facility] ENDOSCOPY;  Service: Gastroenterology;;   CARDIAC SURGERY     COLONOSCOPY WITH PROPOFOL N/A 10/23/2022   Procedure: COLONOSCOPY WITH PROPOFOL;  Surgeon: [name], MD;  Location: [facility] ENDOSCOPY;  Service: Gastroenterology;   Laterality: N/A;   ENTEROSCOPY N/A 02/27/2023   Procedure: ENTEROSCOPY;  Surgeon: Lynann Bologna, DO;  Location: [facility] ENDOSCOPY;  Service: Gastroenterology;  Laterality: N/A;   ESOPHAGOGASTRODUODENOSCOPY (EGD) WITH PROPOFOL N/A 10/23/2022   Procedure: ESOPHAGOGASTRODUODENOSCOPY (EGD) WITH PROPOFOL;  Surgeon: [name], MD;  Location: [facility] ENDOSCOPY;  Service: Gastroenterology;  Laterality: N/A;   HEMOSTASIS CLIP PLACEMENT  02/27/2023   Procedure: HEMOSTASIS CLIP PLACEMENT;  Surgeon: Lynann Bologna, DO;  Location: [facility] ENDOSCOPY;  Service: Gastroenterology;;   IR INJECT/THERA/INC NEEDLE/CATH/PLC EPI/LUMB/SAC W/IMG  12/27/2022   LUMBAR LAMINECTOMY/DECOMPRESSION MICRODISCECTOMY N/A 01/03/2023   Procedure: Lumbar Two-Three, Lumbar Three-Four, Lumbar Four-Five Open laminectomies;  Surgeon: [name], MD;  Location: [facility] OR;  Service: Neurosurgery;  Laterality: N/A;   RADIOLOGY WITH ANESTHESIA N/A 12/27/2022   Procedure: TOTAL SPINE MYELOGRAM;  Surgeon: Radiologist, Medication, MD;  Location: [facility] OR;  Service: Radiology;  Laterality: N/A;   ROTATOR CUFF REPAIR     Social History:  reports that he has quit smoking. He has never used smokeless tobacco. He reports that he does not drink alcohol and does not use drugs.  Allergies  Allergen Reactions   Iodine Hives, Itching and Rash         Shellfish Allergy Hives    Family History  Problem Relation Age of Onset   Diabetes Mother    Heart attack Father 18   Hypertension Brother    Heart disease Brother    Diabetes Brother     Prior  to Admission medications   Medication Sig Start Date End Date Taking? Authorizing Provider  acetaminophen (TYLENOL) 325 MG tablet Take 2 tablets (650 mg total) by mouth every 6 (six) hours as needed for mild pain (or Fever >/= 101). 10/24/22  Yes Elgergawy, [name], MD  albuterol (VENTOLIN HFA) 108 (90 Base) MCG/ACT inhaler Inhale 2 puffs into the lungs every 4 (four) hours as needed for wheezing or  shortness of breath. 02/24/20  Yes [provider]  atorvastatin (LIPITOR) 40 MG tablet Take 40 mg by mouth at bedtime. 08/24/22  Yes [provider]  baclofen (LIORESAL) 10 MG tablet Take 10 mg by mouth 3 (three) times daily.   Yes [provider]  benzocaine (ORAJEL) 10 % mucosal gel Use as directed in the mouth or throat 4 (four) times daily as needed for mouth pain. 01/06/23   Ghimire, Werner Lean, MD  DULoxetine (CYMBALTA) 30 MG capsule Take 30 mg by mouth 2 (two) times daily.    [provider]  gabapentin (NEURONTIN) 300 MG capsule Take 300 mg by mouth 3 (three) times daily.    [provider]  isosorbide mononitrate (IMDUR) 30 MG 24 hr tablet Take 30 mg by mouth daily. 08/24/22   [provider]  losartan (COZAAR) 25 MG tablet Take 1 tablet (25 mg total) by mouth daily. 10/26/22   Elgergawy, [name], MD  metoprolol succinate (TOPROL-XL) 50 MG 24 hr tablet Take 50 mg by mouth daily. 08/24/22   [provider]  pantoprazole (PROTONIX) 40 MG tablet Take 1 tablet (40 mg total) by mouth 2 (two) times daily before a meal. 03/02/23 05/01/23  Lewie Chamber, MD  traZODone (DESYREL) 50 MG tablet Take 50 mg by mouth at bedtime.    [provider]  XARELTO 20 MG TABS tablet Take 1 tablet (20 mg total) by mouth every morning. START ON 7/10 01/10/23   Maretta Bees, MD    Physical Exam: Vitals:   05/20/23 1630 05/20/23 1700 05/20/23 1730 05/20/23 1738  BP: (!) 194/107 (!) 166/111 (!) 150/68   Pulse: 79 75 68 78  Resp: (!) 24 18 20  (!) 33  Temp:      TempSrc:      SpO2: 100% 100% 100% 98%   General:  Appears calm and comfortable and is in NAD Eyes:  PERRL, EOMI, normal lids, iris ENT:  HOH, lips & tongue, mmm; appropriate dentition Neck:  no LAD, masses or thyromegaly; no carotid bruits Cardiovascular:  irregularly, irregular, no m/r/g. 1+  pitting LE edema.  Respiratory:   CTA bilaterally with no wheezes/rales/rhonchi.  Normal  respiratory effort. audible supraglottic wheeze  Abdomen:  soft, NT, ND, NABS Back:   normal alignment, no CVAT Skin:  no rash or induration seen on limited exam Musculoskeletal:  grossly normal tone BUE/BLE, good ROM, no bony abnormality Lower extremity:   Limited foot exam with no ulcerations.  2+ distal pulses. Psychiatric:  grossly normal mood and affect, speech fluent and appropriate, AOx3 Neurologic:  CN 2-12 grossly intact, moves all extremities in coordinated fashion, sensation intact   Radiological Exams on Admission: Independently reviewed - see discussion in A/P where applicable  DG Chest 2 View  Result Date: 05/20/2023 CLINICAL DATA:  Acute chest pain. Shortness of breath. Atrial fibrillation. EXAM: CHEST - 2 VIEW COMPARISON:  01/17/2023 FINDINGS: The heart size and mediastinal contours are within normal limits. Both lungs are clear. The visualized skeletal structures are unremarkable. IMPRESSION: No active cardiopulmonary disease. Electronically Signed  By: Danae Orleans M.D.   On: 05/20/2023 10:36    EKG: Independently reviewed.  Atrial fib with rate 60; nonspecific ST changes with no evidence of acute ischemia    Labs on Admission: I have personally reviewed the available labs and imaging studies at the time of the admission.  Pertinent labs:   hgb: 9.5,  wbc: 3.2,  troponin 81>89,  Assessment and Plan: Principal Problem:   Chest pain Active Problems:   Essential hypertension   Chronic atrial fibrillation with RVR (HCC)   H/O: CVA (cerebrovascular accident)   Iron deficiency anemia   Peripheral neuropathy   Elevated troponin    Assessment and Plan: * Chest pain 88 year old presenting with sub sternal/left chest wall pain that woke him up this morning and relieved some with ASA and NG with EMS -obs to tele -troponin flat 81>89 and near baseline (78) and EKG with no significant changes  -chest pain resolved at this time  -cardiology consulted and plan for  coronary CTA tomorrow -blood pressure significantly elevated and could be cause of bump in troponin and chest pain -hydralazine recently stopped by cardiology, may need adjustment if pressures continue to be elevated  -echo 12/2022: normal EF. Diastolic could not be determined.  -he c/o of shortness of breath with some LE edema. Check BNP, CXR clear. Up 5 pounds in 2 weeks. Will give one dose of lasix.  -continue xarelto/statin/imdur  -check lipid panel/A1c in am    Essential hypertension Significantly elevated blood pressure, but labile readings Hydralazine recently stopped by cardiology, he has not had his medication today  Continue imdur, losartan and metoprolol.  Lopressor IV PRN  May need adjustment if bp not to goal   Chronic atrial fibrillation with RVR (HCC) CHA2DS2-VASc of 5   Continue xarelto and toprol-xl  Has seen cardiology and been referred for a watchman due to ongoing bleeds on xarelto   H/O: CVA (cerebrovascular accident) Plavix stopped 10/29 by cardiology  On xarelto/statin   Iron deficiency anemia Hgb stable Secondary to chronic blood loss vs. Decreased absorption or both  Seen by hematology  B12 deficiency being repleted Iron studies completed   Peripheral neuropathy Continue cymbalta and gabapentin     Advance Care Planning:   Code Status: Full Code   Consults: cardiology: Dr. Lalla Brothers   DVT Prophylaxis: xarelto   Family Communication: daughter on phone: tawanda johnson   Severity of Illness: The appropriate patient status for this patient is OBSERVATION. Observation status is judged to be reasonable and necessary in order to provide the required intensity of service to ensure the patient's safety. The patient's presenting symptoms, physical exam findings, and initial radiographic and laboratory data in the context of their medical condition is felt to place them at decreased risk for further clinical deterioration. Furthermore, it is anticipated that  the patient will be medically stable for discharge from the hospital within 2 midnights of admission.   Author: Orland Mustard, MD 05/20/2023 6:43 PM  For on call review [email or web address].

## 2023-05-20 NOTE — Assessment & Plan Note (Signed)
Significantly elevated blood pressure, but labile readings Hydralazine recently stopped by cardiology, he has not had his medication today  Continue imdur, losartan and metoprolol.  Lopressor IV PRN  May need adjustment if bp not to goal

## 2023-05-20 NOTE — Assessment & Plan Note (Addendum)
Hgb stable Secondary to chronic blood loss vs. Decreased absorption or both  Seen by hematology  B12 deficiency being repleted Iron studies completed

## 2023-05-20 NOTE — ED Provider Notes (Signed)
[place] EMERGENCY DEPARTMENT AT [facility] Provider Note   CSN: [id number] Arrival date & time: 05/20/23  0932     History  No chief complaint on file.   Tristan Gardner is a 88 y.o. male.  HPI Presents with concern for chest pain.  He is currently symptomatic, but today he woke with substernal chest pressure.  He did receive nitroglycerin in transport, notes that as above, he has no ongoing pain.  Does have a history of cardiac disease as well as multiple medical issues including coronary disease, A-fib, he is on Eliquis.  He notes that he been doing generally well until today when he awoke with chest pressure.  No dyspnea, no fever, no nausea, no vomiting.  EMS reports no hemodynamic instability in transport.    Home Medications Prior to Admission medications   Medication Sig Start Date End Date Taking? Authorizing Provider  acetaminophen (TYLENOL) 325 MG tablet Take 2 tablets (650 mg total) by mouth every 6 (six) hours as needed for mild pain (or Fever >/= 101). 10/24/22  Yes Elgergawy, [name], MD  albuterol (VENTOLIN HFA) 108 (90 Base) MCG/ACT inhaler Inhale 2 puffs into the lungs every 4 (four) hours as needed for wheezing or shortness of breath. 02/24/20  Yes [provider]  atorvastatin (LIPITOR) 40 MG tablet Take 40 mg by mouth at bedtime. 08/24/22  Yes [provider]  baclofen (LIORESAL) 10 MG tablet Take 10 mg by mouth 3 (three) times daily.   Yes [provider]  benzocaine (ORAJEL) 10 % mucosal gel Use as directed in the mouth or throat 4 (four) times daily as needed for mouth pain. 01/06/23   Ghimire, Werner Lean, MD  DULoxetine (CYMBALTA) 30 MG capsule Take 30 mg by mouth 2 (two) times daily.    [provider]  gabapentin (NEURONTIN) 300 MG capsule Take 300 mg by mouth 3 (three) times daily.    [provider]  isosorbide mononitrate (IMDUR) 30 MG 24 hr tablet Take 30 mg by mouth daily. 08/24/22   [provider]  losartan (COZAAR) 25 MG tablet Take 1 tablet (25 mg total) by mouth daily. 10/26/22   Elgergawy, [name], MD  metoprolol succinate (TOPROL-XL) 50 MG 24 hr tablet Take 50 mg by mouth daily. 08/24/22   [provider]  pantoprazole (PROTONIX) 40 MG tablet Take 1 tablet (40 mg total) by mouth 2 (two) times daily before a meal. 03/02/23 05/01/23  Lewie Chamber, MD  traZODone (DESYREL) 50 MG tablet Take 50 mg by mouth at bedtime.    [provider]  XARELTO 20 MG TABS tablet Take 1 tablet (20 mg total) by mouth every morning. START ON 7/10 01/10/23   Ghimire, Werner Lean, MD      Allergies    Iodine and Shellfish allergy    Review of Systems   Review of Systems  Physical Exam Updated Vital Signs BP (!) 170/103   Pulse 78   Temp 98.1 F (36.7 C) (Oral)   Resp 20   SpO2 99%  Physical Exam Vitals and nursing note reviewed.  Constitutional:      General: He is not in acute distress.    Appearance: He is well-developed.  HENT:     Head: Normocephalic and atraumatic.  Eyes:     Conjunctiva/sclera: Conjunctivae normal.  Cardiovascular:     Rate and Rhythm: Normal rate and regular rhythm.  Pulmonary:     Effort: Pulmonary effort is normal. No respiratory distress.  Breath sounds: No stridor.  Abdominal:     General: There is no distension.  Musculoskeletal:     Right lower leg: No edema.     Left lower leg: No edema.  Skin:    General: Skin is warm and dry.  Neurological:     Mental Status: He is alert and oriented to person, place, and time.     ED Results / Procedures / Treatments   Labs (all labs ordered are listed, but only abnormal results are displayed) Labs Reviewed  BASIC METABOLIC PANEL - Abnormal; Notable for the following components:      Result Value   Glucose, Bld 106 (*)    Calcium 8.7 (*)    All other components within normal limits  CBC - Abnormal; Notable for the following components:   WBC 3.2 (*)    RBC 3.42 (*)    Hemoglobin 9.5  (*)    HCT 31.2 (*)    RDW 23.8 (*)    All other components within normal limits  TROPONIN I (HIGH SENSITIVITY) - Abnormal; Notable for the following components:   Troponin I (High Sensitivity) 81 (*)    All other components within normal limits  TROPONIN I (HIGH SENSITIVITY) - Abnormal; Notable for the following components:   Troponin I (High Sensitivity) 89 (*)    All other components within normal limits  PROTIME-INR    EKG EKG Interpretation Date/Time:  Sunday May 20 2023 13:46:13 EST Ventricular Rate:  60 PR Interval:    QRS Duration:  105 QT Interval:  433 QTC Calculation: 433 R Axis:   -17  Text Interpretation: Atrial fibrillation Borderline left axis deviation Abnormal R-wave progression, early transition Confirmed by Gerhard Munch [phone]) on 05/20/2023 3:09:24 PM  Radiology DG Chest 2 View  Result Date: 05/20/2023 CLINICAL DATA:  Acute chest pain. Shortness of breath. Atrial fibrillation. EXAM: CHEST - 2 VIEW COMPARISON:  01/17/2023 FINDINGS: The heart size and mediastinal contours are within normal limits. Both lungs are clear. The visualized skeletal structures are unremarkable. IMPRESSION: No active cardiopulmonary disease. Electronically Signed   By: Danae Orleans M.D.   On: 05/20/2023 10:36    Procedures Procedures    Medications Ordered in ED Medications - No data to display  ED Course/ Medical Decision Making/ A&P                                 Medical Decision Making Adult male with history of CAD, A-fib, anticoagulated presents with chest pain.  Risk profile for ACS is substantial, though absence of current complaints is reassuring.  No early evidence for infection, or respiratory compromise. Cardiac 60 sinus normal Pulse ox 99% room air normal   Amount and/or Complexity of Data Reviewed Independent Historian: EMS External Data Reviewed: notes. Labs: ordered. Decision-making details documented in ED Course. Radiology: ordered and  independent interpretation performed. Decision-making details documented in ED Course. ECG/medicine tests: ordered and independent interpretation performed. Decision-making details documented in ED Course.  Risk Prescription drug management. Decision regarding hospitalization. Diagnosis or treatment significantly limited by social determinants of health.   3:09 PM Second troponin slightly increased from prior, up 10% 80, now 89.  Patient has had some episodes of chest pain in the ED, though these are transient.  Given his history of substantial coronary disease, I discussed this case with Dr. Jacques Navy, and cardiology will follow as a consulting service.  Given the patient's multiple medical  issues including hypertension, diabetes, need for anticoagulation management, the patient be admitted to the internal medicine team.        Final Clinical Impression(s) / ED Diagnoses Final diagnoses:  Atypical chest pain     Gerhard Munch, MD 05/20/23 [phone]

## 2023-05-20 NOTE — ED Provider Notes (Signed)
Unassigned medicine will admit.  Cardiology will continue to consult.  Patient scheduled for CT angio.   Vanetta Mulders, MD 05/20/23 1725

## 2023-05-20 NOTE — Assessment & Plan Note (Addendum)
88 year old presenting with sub sternal/left chest wall pain that woke him up this morning and relieved some with ASA and NG with EMS -obs to tele -troponin flat 81>89 and near baseline (78) and EKG with no significant changes  -chest pain resolved at this time  -cardiology consulted and plan for coronary CTA tomorrow -blood pressure significantly elevated and could be cause of bump in troponin and chest pain -hydralazine recently stopped by cardiology, may need adjustment if pressures continue to be elevated  -echo 12/2022: normal EF. Diastolic could not be determined.  -he c/o of shortness of breath with some LE edema. Check BNP, CXR clear. Up 5 pounds in 2 weeks. Will give one dose of lasix.  -continue xarelto/statin/imdur  -check lipid panel/A1c in am

## 2023-05-21 ENCOUNTER — Other Ambulatory Visit: Payer: Self-pay | Admitting: Cardiology

## 2023-05-21 ENCOUNTER — Telehealth: Payer: Self-pay | Admitting: *Deleted

## 2023-05-21 ENCOUNTER — Other Ambulatory Visit: Payer: Self-pay

## 2023-05-21 DIAGNOSIS — R079 Chest pain, unspecified: Secondary | ICD-10-CM | POA: Diagnosis not present

## 2023-05-21 DIAGNOSIS — R0789 Other chest pain: Secondary | ICD-10-CM

## 2023-05-21 LAB — HEMOGLOBIN A1C
Hgb A1c MFr Bld: 5.3 % (ref 4.8–5.6)
Mean Plasma Glucose: 105.41 mg/dL

## 2023-05-21 LAB — CBC
HCT: 29.1 % — ABNORMAL LOW (ref 39.0–52.0)
Hemoglobin: 9 g/dL — ABNORMAL LOW (ref 13.0–17.0)
MCH: 27.5 pg (ref 26.0–34.0)
MCHC: 30.9 g/dL (ref 30.0–36.0)
MCV: 89 fL (ref 80.0–100.0)
Platelets: 186 10*3/uL (ref 150–400)
RBC: 3.27 MIL/uL — ABNORMAL LOW (ref 4.22–5.81)
RDW: 23.7 % — ABNORMAL HIGH (ref 11.5–15.5)
WBC: 3.6 10*3/uL — ABNORMAL LOW (ref 4.0–10.5)
nRBC: 0 % (ref 0.0–0.2)

## 2023-05-21 LAB — BASIC METABOLIC PANEL
Anion gap: 8 (ref 5–15)
BUN: 10 mg/dL (ref 8–23)
CO2: 26 mmol/L (ref 22–32)
Calcium: 8.5 mg/dL — ABNORMAL LOW (ref 8.9–10.3)
Chloride: 104 mmol/L (ref 98–111)
Creatinine, Ser: 1.25 mg/dL — ABNORMAL HIGH (ref 0.61–1.24)
GFR, Estimated: 56 mL/min — ABNORMAL LOW (ref >60)
Glucose, Bld: 106 mg/dL — ABNORMAL HIGH (ref 70–99)
Potassium: 3.1 mmol/L — ABNORMAL LOW (ref 3.5–5.1)
Sodium: 138 mmol/L (ref 135–145)

## 2023-05-21 LAB — LIPID PANEL
Cholesterol: 103 mg/dL (ref 0–200)
HDL: 50 mg/dL (ref >40)
LDL Cholesterol: 46 mg/dL (ref 0–99)
Total CHOL/HDL Ratio: 2.1 {ratio}
Triglycerides: 37 mg/dL (ref <150)
VLDL: 7 mg/dL (ref 0–40)

## 2023-05-21 LAB — METHYLMALONIC ACID, SERUM: Methylmalonic Acid, Quantitative: 178 nmol/L (ref 0–378)

## 2023-05-21 LAB — MAGNESIUM: Magnesium: 1.6 mg/dL — ABNORMAL LOW (ref 1.7–2.4)

## 2023-05-21 MED ORDER — POTASSIUM CHLORIDE CRYS ER 20 MEQ PO TBCR
40.0000 meq | EXTENDED_RELEASE_TABLET | ORAL | Status: AC
  Administered 2023-05-21 (×2): 40 meq via ORAL
  Filled 2023-05-21 (×2): qty 2

## 2023-05-21 MED ORDER — MAGNESIUM SULFATE 4 GM/100ML IV SOLN
4.0000 g | Freq: Once | INTRAVENOUS | Status: AC
  Administered 2023-05-21: 4 g via INTRAVENOUS
  Filled 2023-05-21: qty 100

## 2023-05-21 MED ORDER — GABAPENTIN 300 MG PO CAPS
300.0000 mg | ORAL_CAPSULE | Freq: Three times a day (TID) | ORAL | Status: DC
  Administered 2023-05-21 (×2): 300 mg via ORAL
  Filled 2023-05-21 (×2): qty 1

## 2023-05-21 MED ORDER — DULOXETINE HCL 30 MG PO CPEP
30.0000 mg | ORAL_CAPSULE | Freq: Two times a day (BID) | ORAL | Status: DC
  Administered 2023-05-21: 30 mg via ORAL
  Filled 2023-05-21: qty 1

## 2023-05-21 MED ORDER — PANTOPRAZOLE SODIUM 40 MG PO TBEC
40.0000 mg | DELAYED_RELEASE_TABLET | Freq: Two times a day (BID) | ORAL | Status: DC
  Administered 2023-05-21 (×2): 40 mg via ORAL
  Filled 2023-05-21 (×2): qty 1

## 2023-05-21 MED ORDER — RIVAROXABAN 10 MG PO TABS
20.0000 mg | ORAL_TABLET | Freq: Every day | ORAL | Status: DC
  Administered 2023-05-21: 20 mg via ORAL
  Filled 2023-05-21: qty 2

## 2023-05-21 MED ORDER — ATORVASTATIN CALCIUM 40 MG PO TABS: 40.0000 mg | ORAL_TABLET | Freq: Every day | ORAL | Status: DC

## 2023-05-21 MED ORDER — BACLOFEN 10 MG PO TABS
10.0000 mg | ORAL_TABLET | Freq: Three times a day (TID) | ORAL | Status: DC
  Administered 2023-05-21: 10 mg via ORAL
  Filled 2023-05-21: qty 1

## 2023-05-21 MED ORDER — METOPROLOL SUCCINATE ER 25 MG PO TB24
50.0000 mg | ORAL_TABLET | Freq: Every day | ORAL | Status: DC
  Administered 2023-05-21: 50 mg via ORAL
  Filled 2023-05-21: qty 2

## 2023-05-21 MED ORDER — TRAZODONE HCL 50 MG PO TABS: 50.0000 mg | ORAL_TABLET | Freq: Every day | ORAL | Status: DC

## 2023-05-21 NOTE — Evaluation (Signed)
Physical Therapy Evaluation Patient Details Name: Tristan Gardner MRN: [id number] DOB: 1935-03-24 Today's Date: 05/21/2023  History of Present Illness  Pt is 88 year old presented to [facility] on  05/20/23 for chest pain. Pt with mildly elevated but stable troponins and a history of chronic troponin elevation. Further cardiac work up in progress. PMH - afib, CAD, HTN, NSTEMI, CVA, sciatica, chronic pain, and obesity.  Clinical Impression  Pt admitted with above diagnosis and presents to PT with functional limitations due to deficits listed below (See PT problem list). Pt needs skilled PT to maximize independence and safety. Pt from home with granddaughter and sounds like he attend PACE program M-F as well as has an aide for 3 hours M-F. Able to amb in hallway with contact guard for safety. Appears pt has the support to return home. I believe his PT can be through his PACE daily program.           If plan is discharge home, recommend the following: A little help with walking and/or transfers;Assistance with cooking/housework;A little help with bathing/dressing/bathroom;Assist for transportation   Can travel by private vehicle        Equipment Recommendations None recommended by PT  Recommendations for Other Services       Functional Status Assessment Patient has had a recent decline in their functional status and demonstrates the ability to make significant improvements in function in a reasonable and predictable amount of time.     Precautions / Restrictions Precautions Precautions: Fall Restrictions Weight Bearing Restrictions: No      Mobility  Bed Mobility Overal bed mobility: Needs Assistance Bed Mobility: Sidelying to Sit, Sit to Supine   Sidelying to sit: Min assist, HOB elevated, Used rails   Sit to supine: Contact guard assist, Used rails   General bed mobility comments: Assist to elevate trunk into sitting. Incr time to perform.    Transfers Overall transfer level:  Needs assistance Equipment used: Rollator (4 wheels) Transfers: Sit to/from Stand Sit to Stand: Min assist           General transfer comment: Assist for balance    Ambulation/Gait Ambulation/Gait assistance: Contact guard assist Gait Distance (Feet): 150 Feet Assistive device: Rollator (4 wheels) Gait Pattern/deviations: Step-through pattern, Decreased step length - right, Decreased step length - left, Shuffle, Trunk flexed Gait velocity: decr Gait velocity interpretation: <1.8 ft/sec, indicate of risk for recurrent falls   General Gait Details: Assist for safety. Cues to stand more upright and closer to walker  Stairs            Wheelchair Mobility     Tilt Bed    Modified Rankin (Stroke Patients Only)       Balance Overall balance assessment: Needs assistance Sitting-balance support: No upper extremity supported, Feet supported Sitting balance-Leahy Scale: Fair     Standing balance support: Bilateral upper extremity supported, Single extremity supported, Reliant on assistive device for balance Standing balance-Leahy Scale: Poor Standing balance comment: UE support and CGA for static standing                             Pertinent Vitals/Pain Pain Assessment Pain Assessment: No/denies pain    Home Living Family/patient expects to be discharged to:: Private residence Living Arrangements: Other relatives (granddaughter) Available Help at Discharge: Family;Available PRN/intermittently Type of Home: House Home Access: Level entry       Home Layout: One level Home Equipment: Rollator (4 wheels);Shower seat  Additional Comments: aide M-F 3 hours    Prior Function Prior Level of Function : Needs assist       Physical Assist : ADLs (physical)     Mobility Comments: Modified independent with rollator ADLs Comments: Aide helps with bathing     Extremity/Trunk Assessment   Upper Extremity Assessment Upper Extremity Assessment: Defer to  OT evaluation    Lower Extremity Assessment Lower Extremity Assessment: Generalized weakness       Communication   Communication Communication: Hearing impairment  Cognition Arousal: Alert Behavior During Therapy: WFL for tasks assessed/performed Overall Cognitive Status: No family/caregiver present to determine baseline cognitive functioning                                          General Comments      Exercises     Assessment/Plan    PT Assessment Patient needs continued PT services  PT Problem List Decreased strength;Decreased activity tolerance;Decreased balance;Decreased mobility       PT Treatment Interventions DME instruction;Gait training;Functional mobility training;Therapeutic activities;Therapeutic exercise;Balance training;Patient/family education    PT Goals (Current goals can be found in the Care Plan section)  Acute Rehab PT Goals Patient Stated Goal: go home PT Goal Formulation: With patient Time For Goal Achievement: 06/04/23 Potential to Achieve Goals: Good    Frequency Min 1X/week     Co-evaluation               AM-PAC PT "6 Clicks" Mobility  Outcome Measure Help needed turning from your back to your side while in a flat bed without using bedrails?: A Little Help needed moving from lying on your back to sitting on the side of a flat bed without using bedrails?: A Little Help needed moving to and from a bed to a chair (including a wheelchair)?: A Little Help needed standing up from a chair using your arms (e.g., wheelchair or bedside chair)?: A Little Help needed to walk in hospital room?: A Little Help needed climbing 3-5 steps with a railing? : A Lot 6 Click Score: 17    End of Session Equipment Utilized During Treatment: Gait belt Activity Tolerance: Patient tolerated treatment well Patient left: in bed;with call bell/phone within reach Nurse Communication: Mobility status PT Visit Diagnosis: Unsteadiness on feet  (R26.81);Other abnormalities of gait and mobility (R26.89);Muscle weakness (generalized) (M62.81)    Time: 5366-4403 PT Time Calculation (min) (ACUTE ONLY): 23 min   Charges:   PT Evaluation $PT Eval Moderate Complexity: 1 Mod PT Treatments $Gait Training: 8-22 mins PT General Charges $$ ACUTE PT VISIT: 1 Visit         [facility] PT Acute Rehabilitation Services Office [phone]   Angelina Ok [facility] 05/21/2023, 1:48 PM

## 2023-05-21 NOTE — Progress Notes (Signed)
IV iron ordered at [facility]

## 2023-05-21 NOTE — Telephone Encounter (Signed)
-----   Message from [name] sent at 05/21/2023 12:37 PM EST ----- Hi Tristan Gardner Please let daughter know he has iron deficiency and b12 deficiency. Will order iv iron at [facility]. Please let her know to start b12 1000 mcg daily. Will repeat CBC, b12, ferritin in about 4 weeks thanks.

## 2023-05-21 NOTE — ED Notes (Signed)
Cardiology PA has already stopped by to see patient, pt understands that he will be following up with Cards, Cards will call his niece to set up appointment. Pt has been given opportunity to ask questions, pt does not have any concerns at this time and understands to follow up with cards.

## 2023-05-21 NOTE — Progress Notes (Addendum)
Patient Name: Tristan Gardner Date of Encounter: 05/21/2023 [facility] Cardiologist: None   Interval Summary  .    Patient reports feeling well today. Denies chest pain, shortness of breath.   Vital Signs .    Vitals:   05/21/23 0700 05/21/23 0927 05/21/23 1000 05/21/23 1059  BP: (!) 151/83 124/74 (!) 147/72   Pulse: 80 81 80   Resp: (!) 30 (!) 34 (!) 29   Temp: 98.5 F (36.9 C)     TempSrc:    Oral  SpO2: 95% 96% 95%     Intake/Output Summary (Last 24 hours) at 05/21/2023 1105 Last data filed at 05/20/2023 2102 Gross per 24 hour  Intake --  Output 1400 ml  Net -1400 ml      05/18/2023   11:20 AM 05/01/2023    3:25 PM 02/27/2023    2:22 PM  Last 3 Weights  Weight (lbs) 248 lb 4.8 oz 244 lb 3.2 oz 265 lb  Weight (kg) 112.628 kg 110.768 kg 120.203 kg      Telemetry/ECG    Atrial fibrillation, HR in the 50s-80s - Personally Reviewed  Physical Exam .   GEN: No acute distress.  Laying flat in the bed  Neck: No JVD Cardiac: Irregular rate and rhythm. no murmurs, rubs, or gallops.  Respiratory: Clear to auscultation bilaterally. Normal work of breathing on room air  GI: Soft, nontender, non-distended  MS: No edema in BLE   Assessment & Plan .     Chest Pain  - Patient presented complaining of sub sternal/left chest wall pain that woke him up from sleep on 11/17. Relieved with nitroglycerin and ASA. Describes the pain as sharp, lasted for about 1 hour. Has not recurred since - He denies shortness of breath or chest pain on exertion- reports going to a facility every day to exercise without angina  - hsTn 81, 89. Note, has chronically elevated troponins, often in the 50s-70s in the past  - BP has been severely elevated- up to 190s/110s in the ED. Suspect this could contribute to his chest pain and troponin elevation  - Pain seems atypical- occurred at rest, he has not been having exertional CP or DOE.  - Initially considered coronary CTA, but patient is  in atrial fibrillation and he has a contrast allergy  - Echocardiogram pending today  - Will discuss further with MD- could consider nuclear stress test to rule out ischemia. Not sure his presentation warrants cardiac catheterization at this time   Atrial Fibrillation  - Patient remains in rate-controlled afib/flutter - Continue xarelto. Note, patient did not receive xarelto last night.  - Scheduled to see Dr. Jimmey Ralph in a few weeks to discuss watchman  - Continue metoprolol succinate 50 mg daily   HTN  - BP initially elevated to 190s/100s in the ED. Now improving  - Continue losartan 25 mg daily, imdur 30 mg daily, metoprolol succinate 50 mg daily   Otherwise per primary  - Iron Deficiency Anemia  - History of CVA - Peripheral Neuropathy   For questions or updates, please contact [facility] Please consult [email or web address] for contact info under        Signed, Jonita Albee, PA-C   Patient seen and examined with KJ PA-C.  Agree as above, with the following exceptions and changes as noted below. Spent time on phone with granddaughter [name] regarding care. Pt had a large salty meal prior to presentation, with elevation in BP likely as a result  and chest pain with minimal troponin elevation. Gen: NAD, CV: RRR, no murmurs, Lungs: clear, Abd: soft, Extrem: Warm, well perfused, no edema, Neuro/Psych: alert and oriented x 3, normal mood and affect. All available labs, radiology testing, previous records reviewed. Given age, and hx of bleeding, would be inclined to risk stratify for ischemia prior to pursing cath. Family in agreement.   Parke Poisson, MD 05/21/23 3:38 PM

## 2023-05-21 NOTE — Hospital Course (Signed)
88 y.o. male with medical history significant of atrial fibrillation on xarelto, CAD, hx of CVA, HTN, HLD, hx of GI bleed (last in 02/2023) here with complaints of chest pain.  He states he had chest pain this morning that woke him up. Pain was in the middle of his chest and into left chest wall.  He states it radiated just a little bit into his left arm. Pain rated as a 8/10 and described as sharp. Pain lasted a few hours. He had associated shortness of breath, no palpitations or light headedness. Denies diaphoresis. He has no pain at this time.

## 2023-05-21 NOTE — Discharge Summary (Signed)
Physician Discharge Summary   Patient: Tristan Gardner MRN: [id number] DOB: 08-Sep-1934  Admit date:     05/20/2023  Discharge date: 05/21/23  Discharge Physician: Rickey Barbara   PCP: [name], MD   Recommendations at discharge:    Follow up with PCP in 1-2 weeks Follow up with outpatient stress test and Cardiology f/u as will be arranged  Discharge Diagnoses: Principal Problem:   Chest pain Active Problems:   Essential hypertension   Chronic atrial fibrillation with RVR (HCC)   H/O: CVA (cerebrovascular accident)   Iron deficiency anemia   Peripheral neuropathy   Elevated troponin  Resolved Problems:   CAD (coronary artery disease)   Hypertensive urgency  Hospital Course: 88 y.o. male with medical history significant of atrial fibrillation on xarelto, CAD, hx of CVA, HTN, HLD, hx of GI bleed (last in 02/2023) here with complaints of chest pain.  He states he had chest pain this morning that woke him up. Pain was in the middle of his chest and into left chest wall.  He states it radiated just a little bit into his left arm. Pain rated as a 8/10 and described as sharp. Pain lasted a few hours. He had associated shortness of breath, no palpitations or light headedness. Denies diaphoresis. He has no pain at this time.   Assessment and Plan: Chest pain -remained stable this visit -troponin flat 81>89 and near baseline (78) and EKG with no significant changes  -chest pain resolved at this time  -cardiology consulted  -Pt was initially planned for coronary CT, however with afib/flutter, study could not be done -Cardiology had recommended outpatient stress test to risk stratify for ischemia prior to pursuing cath -Discussed with Cardiology. Ok to d/c today     Essential hypertension Cont home meds on d/c   Chronic atrial fibrillation with RVR (HCC) CHA2DS2-VASc of 5   Continue xarelto and toprol-xl  Has seen cardiology and been referred for a watchman due to ongoing bleeds on  xarelto    H/O: CVA (cerebrovascular accident) Plavix stopped 10/29 by cardiology  On xarelto/statin    Iron deficiency anemia Hgb stable Secondary to chronic blood loss vs. Decreased absorption or both  Seen by hematology, IV iron was ordered by Hematology B12 deficiency being repleted   Peripheral neuropathy Continue cymbalta and gabapentin     Consultants: Cardiology Procedures performed:   Disposition: Home Diet recommendation:  Cardiac diet DISCHARGE MEDICATION: Allergies as of 05/21/2023       Reactions   Iodine Hives, Itching, Rash      Shellfish Allergy Hives        Medication List     TAKE these medications    acetaminophen 325 MG tablet Commonly known as: TYLENOL Take 2 tablets (650 mg total) by mouth every 6 (six) hours as needed for mild pain (or Fever >/= 101).   albuterol 108 (90 Base) MCG/ACT inhaler Commonly known as: VENTOLIN HFA Inhale 2 puffs into the lungs every 4 (four) hours as needed for wheezing or shortness of breath.   atorvastatin 40 MG tablet Commonly known as: LIPITOR Take 40 mg by mouth at bedtime.   baclofen 10 MG tablet Commonly known as: LIORESAL Take 10 mg by mouth 3 (three) times daily.   DULoxetine 30 MG capsule Commonly known as: CYMBALTA Take 30 mg by mouth 2 (two) times daily.   gabapentin 300 MG capsule Commonly known as: NEURONTIN Take 300 mg by mouth 3 (three) times daily.   isosorbide mononitrate 30 MG  24 hr tablet Commonly known as: IMDUR Take 30 mg by mouth daily.   losartan 25 MG tablet Commonly known as: COZAAR Take 1 tablet (25 mg total) by mouth daily.   metoprolol succinate 50 MG 24 hr tablet Commonly known as: TOPROL-XL Take 50 mg by mouth daily.   pantoprazole 40 MG tablet Commonly known as: Protonix Take 1 tablet (40 mg total) by mouth 2 (two) times daily before a meal.   traZODone 50 MG tablet Commonly known as: DESYREL Take 50 mg by mouth at bedtime.   Xarelto 20 MG Tabs  tablet Generic drug: rivaroxaban Take 1 tablet (20 mg total) by mouth every morning. START ON 7/10        Follow-up Information     [name], MD Follow up in 2 week(s).   Specialty: Internal Medicine Why: Hospital follow up Contact information: [address] [place] [address] [phone]         Follow up with outpatient stress test as per Cardiology Follow up.                 Discharge Exam: There were no vitals filed for this visit. General exam: Awake, laying in bed, in nad Respiratory system: Normal respiratory effort, no wheezing Cardiovascular system: regular rate, s1, s2 Gastrointestinal system: Soft, nondistended, positive BS Central nervous system: CN2-12 grossly intact, strength intact Extremities: Perfused, no clubbing Skin: Normal skin turgor, no notable skin lesions seen Psychiatry: Mood normal // no visual hallucinations   Condition at discharge: fair  The results of significant diagnostics from this hospitalization (including imaging, microbiology, ancillary and laboratory) are listed below for reference.   Imaging Studies: DG Chest 2 View  Result Date: 05/20/2023 CLINICAL DATA:  Acute chest pain. Shortness of breath. Atrial fibrillation. EXAM: CHEST - 2 VIEW COMPARISON:  01/17/2023 FINDINGS: The heart size and mediastinal contours are within normal limits. Both lungs are clear. The visualized skeletal structures are unremarkable. IMPRESSION: No active cardiopulmonary disease. Electronically Signed   By: Danae Orleans M.D.   On: 05/20/2023 10:36    Microbiology: Results for orders placed or performed during the hospital encounter of 01/17/23  Blood culture (routine x 2)     Status: None   Collection Time: 01/17/23  2:52 PM   Specimen: BLOOD RIGHT HAND  Result Value Ref Range Status   Specimen Description BLOOD RIGHT HAND  Final   Special Requests   Final    BOTTLES DRAWN AEROBIC AND ANAEROBIC Blood Culture results may not be optimal  due to an inadequate volume of blood received in culture bottles   Culture   Final    NO GROWTH 5 DAYS Performed at [facility], [address] [place], [place] [address]    Report Status 01/22/2023 FINAL  Final    Labs: CBC: Recent Labs  Lab 05/18/23 1159 05/20/23 0955 05/21/23 0353  WBC 3.0* 3.2* 3.6*  NEUTROABS 1.6*  --   --   HGB 9.3* 9.5* 9.0*  HCT 28.8* 31.2* 29.1*  MCV 87.5 91.2 89.0  PLT 201 195 186   Basic Metabolic Panel: Recent Labs  Lab 05/20/23 0955 05/21/23 0353  NA 137 138  K 3.8 3.1*  CL 104 104  CO2 23 26  GLUCOSE 106* 106*  BUN 10 10  CREATININE 0.90 1.25*  CALCIUM 8.7* 8.5*  MG  --  1.6*   Liver Function Tests: No results for input(s): "AST", "ALT", "ALKPHOS", "BILITOT", "PROT", "ALBUMIN" in the last  168 hours. CBG: No results for input(s): "GLUCAP" in the last 168 hours.  Discharge time spent: less than 30 minutes.  Signed: Rickey Barbara, MD [facility] 05/21/2023

## 2023-05-21 NOTE — Telephone Encounter (Signed)
Notified daughter of message below. Verbalized understanding

## 2023-05-21 NOTE — Progress Notes (Signed)
Ordered nuclear stress for evaluation of chest pain. Dr. Jacques Navy obtained consent. I have messaged the [address] schedulers to arrange study.   Informed Consent   Shared Decision Making/Informed Consent The risks [chest pain, shortness of breath, cardiac arrhythmias, dizziness, blood pressure fluctuations, myocardial infarction, stroke/transient ischemic attack, nausea, vomiting, allergic reaction, radiation exposure, metallic taste sensation and life-threatening complications (estimated to be 1 in 10,000)], benefits (risk stratification, diagnosing coronary artery disease, treatment guidance) and alternatives of a nuclear stress test were discussed in detail with Tristan Gardner and he agrees to proceed.    Jonita Albee, PA-C 05/21/2023 2:35 PM

## 2023-05-25 ENCOUNTER — Telehealth: Payer: Self-pay | Admitting: Pharmacy Technician

## 2023-05-25 ENCOUNTER — Telehealth (HOSPITAL_COMMUNITY): Payer: Self-pay | Admitting: *Deleted

## 2023-05-25 NOTE — Telephone Encounter (Signed)
Patient given detailed instructions per Myocardial Perfusion Study Information Sheet for the test on 05/30/2023 at 10:00. Patient notified to arrive 15 minutes early and that it is imperative to arrive on time for appointment to keep from having the test rescheduled.10:00  If you need to cancel or reschedule your appointment, please call the office within 24 hours of your appointment. . Patient verbalized understanding.Daneil Dolin

## 2023-05-25 NOTE — Telephone Encounter (Addendum)
 Dr. Cherly Hensen, [facility] note:  patient will be scheduled as soon as possible.  Auth Submission: APPROVED Site of care: Site of care: [facility] Payer: [employer] Medication & CPT/J Code(s) submitted: Monoferric (Ferrci derisomaltose) [phone] Route of submission (phone, fax, portal):  Phone # [phone] Fax # Auth type: Buy/Bill PB Units/visits requested: 1 Reference number:  Approval from: 05/25/23 to 07/03/23

## 2023-05-30 ENCOUNTER — Ambulatory Visit (HOSPITAL_COMMUNITY): Payer: Medicare (Managed Care) | Attending: Cardiology

## 2023-05-30 DIAGNOSIS — R0789 Other chest pain: Secondary | ICD-10-CM | POA: Insufficient documentation

## 2023-05-30 LAB — MYOCARDIAL PERFUSION IMAGING
Estimated workload: 1
Exercise duration (min): 1 min
Exercise duration (sec): 0 s
LV dias vol: 128 mL (ref 62–150)
LV sys vol: 60 mL
MPHR: 135 {beats}/min
Nuc Stress EF: 53 %
Peak HR: 60 {beats}/min
Percent HR: 44 %
Rest HR: 58 {beats}/min
Rest Nuclear Isotope Dose: 10.6 mCi
SDS: 1
SRS: 0
SSS: 1
ST Depression (mm): 0 mm
Stress Nuclear Isotope Dose: 30.7 mCi
TID: 1.19

## 2023-05-30 MED ORDER — TECHNETIUM TC 99M TETROFOSMIN IV KIT
30.7000 | PACK | Freq: Once | INTRAVENOUS | Status: AC | PRN
  Administered 2023-05-30: 30.7 via INTRAVENOUS

## 2023-05-30 MED ORDER — REGADENOSON 0.4 MG/5ML IV SOLN
0.4000 mg | Freq: Once | INTRAVENOUS | Status: AC
  Administered 2023-05-30: 0.4 mg via INTRAVENOUS

## 2023-05-30 MED ORDER — TECHNETIUM TC 99M TETROFOSMIN IV KIT
10.6000 | PACK | Freq: Once | INTRAVENOUS | Status: AC | PRN
  Administered 2023-05-30: 10.6 via INTRAVENOUS

## 2023-06-04 ENCOUNTER — Telehealth: Payer: Self-pay

## 2023-06-04 NOTE — Telephone Encounter (Signed)
Called patient unable to leave message

## 2023-06-04 NOTE — Telephone Encounter (Signed)
-----   Message from Jonita Albee sent at 05/31/2023  7:11 PM EST ----- Please tell patient that his nuclear stress test was a normal, low risk study. The LV (chamber of the heart that pumps blood to the body), is functioning normally. Overall, this is great news!  Thanks,  [employer]

## 2023-06-05 ENCOUNTER — Ambulatory Visit: Payer: Medicare (Managed Care) | Admitting: Cardiology

## 2023-06-05 NOTE — Progress Notes (Unsigned)
Electrophysiology Office Note:   Date:  06/05/2023  ID:  Tristan Gardner, DOB 08-Feb-1935, MRN [id number]  Primary Cardiologist: None Electrophysiologist: [name], MD  {Click to update primary MD,subspecialty MD or APP then REFRESH:1}    History of Present Illness:   Tristan Gardner is a 88 y.o. male with h/o rate controlled A-fib/flutter, chronic blood loss anemia with iron deficiency, h/o stroke  seen today for evaluation for Watchman device implant at the request of Dr. Rosemary Holms.   Patient had recent hospitalization with blood loss anemia requiring transfusion. GI evaluation included enteroscopy with red blood noted in stomach but no obvious source. He also presented to ED on 05/20/23 with atypical chest pain. Nuclear stress was done on 05/30/23 and was normal.   Review of systems complete and found to be negative unless listed in HPI.   EP Information / Studies Reviewed:    EKG is not ordered today. EKG from 05/20/23 reviewed which showed rate controlled atrial flutter.      Nuclear Stress 05/30/23:   The study is normal. The study is low risk.   No ST deviation was noted.   Left ventricular function is normal. End diastolic cavity size is normal.   Prior study not available for comparison.  Echo 05/01/23:  1. Left ventricular ejection fraction, by estimation, is 50 to 55%. The  left ventricle has low normal function. The left ventricle has no regional  wall motion abnormalities. There is moderate concentric left ventricular  hypertrophy. Left ventricular diastolic function could not be evaluated.   2. Right ventricular systolic function was not well visualized. The right  ventricular size is not well visualized. There is normal pulmonary artery  systolic pressure.   3. Left atrial size was mild to moderately dilated.   4. The mitral valve is normal in structure. Mild mitral valve  regurgitation. No evidence of mitral stenosis.   5. The aortic valve is tricuspid. There is  mild calcification of the  aortic valve. There is mild thickening of the aortic valve. Aortic valve  regurgitation is not visualized.   6. Aortic dilatation noted. There is mild dilatation of the ascending  aorta, measuring 40 mm.   7. The inferior vena cava is normal in size with greater than 50%  respiratory variability, suggesting right atrial pressure of 3 mmHg.   Risk Assessment/Calculations:    CHA2DS2-VASc Score = 5  {Confirm score is correct.  If not, click here to update score.  REFRESH note.  :1} This indicates a 7.2% annual risk of stroke. The patient's score is based upon: CHF History: 0 HTN History: 1 Diabetes History: 0 Stroke History: 2 Vascular Disease History: 0 Age Score: 2 Gender Score: 0   {This patient has a significant risk of stroke if diagnosed with atrial fibrillation.  Please consider VKA or DOAC agent for anticoagulation if the bleeding risk is acceptable.   You can also use the SmartPhrase .[email or web address] for documentation.   :[id number]} No BP recorded.  {Refresh Note OR Click here to enter BP  :1}***        Physical Exam:   VS:  There were no vitals taken for this visit.   Wt Readings from Last 3 Encounters:  05/18/23 248 lb 4.8 oz (112.6 kg)  05/01/23 244 lb 3.2 oz (110.8 kg)  02/27/23 265 lb (120.2 kg)     GEN: Well nourished, well developed in no acute distress NECK: No JVD; No carotid bruits CARDIAC: {EPRHYTHM:28826}, no murmurs, rubs, gallops RESPIRATORY:  Clear  to auscultation without rales, wheezing or rhonchi  ABDOMEN: Soft, non-tender, non-distended EXTREMITIES:  No edema; No deformity   ASSESSMENT AND PLAN:   Tristan Gardner is a 88 y.o. male with h/o rate controlled A-fib/flutter, chronic blood loss anemia with iron deficiency, h/o stroke  seen today for evaluation for Watchman device implant at the request of Dr. Rosemary Holms.   Follow up with {YNWGN:56213} {EPFOLLOW YQ:65784}  Signed, [name], MD

## 2023-06-05 NOTE — Telephone Encounter (Signed)
Called patient advised of below they verbalized understanding and will pass on the message

## 2023-06-07 ENCOUNTER — Ambulatory Visit (INDEPENDENT_AMBULATORY_CARE_PROVIDER_SITE_OTHER): Payer: Medicare (Managed Care)

## 2023-06-07 VITALS — BP 127/70 | HR 56 | Temp 97.8°F | Resp 16 | Ht 71.5 in | Wt 248.2 lb

## 2023-06-07 DIAGNOSIS — D508 Other iron deficiency anemias: Secondary | ICD-10-CM

## 2023-06-07 DIAGNOSIS — D509 Iron deficiency anemia, unspecified: Secondary | ICD-10-CM | POA: Diagnosis not present

## 2023-06-07 MED ORDER — ACETAMINOPHEN 325 MG PO TABS
650.0000 mg | ORAL_TABLET | Freq: Once | ORAL | Status: AC
  Administered 2023-06-07: 650 mg via ORAL
  Filled 2023-06-07: qty 2

## 2023-06-07 MED ORDER — SODIUM CHLORIDE 0.9 % IV SOLN
1000.0000 mg | Freq: Once | INTRAVENOUS | Status: AC
  Administered 2023-06-07: 1000 mg via INTRAVENOUS
  Filled 2023-06-07: qty 10

## 2023-06-07 MED ORDER — DIPHENHYDRAMINE HCL 25 MG PO CAPS
25.0000 mg | ORAL_CAPSULE | Freq: Once | ORAL | Status: AC
Start: 2023-06-07 — End: 2023-06-07
  Administered 2023-06-07: 25 mg via ORAL
  Filled 2023-06-07: qty 1

## 2023-06-07 NOTE — Progress Notes (Signed)
Diagnosis: Iron Deficiency Anemia  Provider:  Chilton Greathouse MD  Procedure: IV Infusion  IV Type: Peripheral, IV Location: R Hand  Monoferric (Ferric Derisomaltose), Dose: 1000 mg  Infusion Start Time: 1543  Infusion Stop Time: 1609  Post Infusion IV Care: Observation period completed and Peripheral IV Discontinued  Discharge: Condition: Good, Destination: Home . AVS Provided  Performed by:  Garnette Czech, RN

## 2023-06-11 ENCOUNTER — Emergency Department (HOSPITAL_COMMUNITY): Payer: Medicare (Managed Care)

## 2023-06-11 ENCOUNTER — Other Ambulatory Visit: Payer: Self-pay

## 2023-06-11 ENCOUNTER — Observation Stay (HOSPITAL_COMMUNITY)
Admission: EM | Admit: 2023-06-11 | Discharge: 2023-06-14 | Disposition: A | Discharge status: Skilled Nursing Facility | Payer: Medicare (Managed Care) | Attending: Internal Medicine | Admitting: Internal Medicine

## 2023-06-11 ENCOUNTER — Encounter (HOSPITAL_COMMUNITY): Payer: Self-pay | Admitting: *Deleted

## 2023-06-11 DIAGNOSIS — Z87891 Personal history of nicotine dependence: Secondary | ICD-10-CM | POA: Diagnosis not present

## 2023-06-11 DIAGNOSIS — E861 Hypovolemia: Secondary | ICD-10-CM | POA: Diagnosis not present

## 2023-06-11 DIAGNOSIS — I10 Essential (primary) hypertension: Secondary | ICD-10-CM | POA: Diagnosis not present

## 2023-06-11 DIAGNOSIS — I959 Hypotension, unspecified: Principal | ICD-10-CM | POA: Diagnosis present

## 2023-06-11 DIAGNOSIS — G8929 Other chronic pain: Secondary | ICD-10-CM | POA: Diagnosis not present

## 2023-06-11 DIAGNOSIS — Z7901 Long term (current) use of anticoagulants: Secondary | ICD-10-CM | POA: Insufficient documentation

## 2023-06-11 DIAGNOSIS — N3000 Acute cystitis without hematuria: Secondary | ICD-10-CM | POA: Diagnosis not present

## 2023-06-11 DIAGNOSIS — I2510 Atherosclerotic heart disease of native coronary artery without angina pectoris: Secondary | ICD-10-CM | POA: Diagnosis not present

## 2023-06-11 DIAGNOSIS — Z8673 Personal history of transient ischemic attack (TIA), and cerebral infarction without residual deficits: Secondary | ICD-10-CM | POA: Insufficient documentation

## 2023-06-11 DIAGNOSIS — R2689 Other abnormalities of gait and mobility: Secondary | ICD-10-CM | POA: Insufficient documentation

## 2023-06-11 DIAGNOSIS — M6281 Muscle weakness (generalized): Secondary | ICD-10-CM | POA: Diagnosis not present

## 2023-06-11 DIAGNOSIS — Z79899 Other long term (current) drug therapy: Secondary | ICD-10-CM | POA: Insufficient documentation

## 2023-06-11 DIAGNOSIS — R2681 Unsteadiness on feet: Secondary | ICD-10-CM | POA: Diagnosis not present

## 2023-06-11 DIAGNOSIS — I482 Chronic atrial fibrillation, unspecified: Secondary | ICD-10-CM | POA: Diagnosis present

## 2023-06-11 DIAGNOSIS — E785 Hyperlipidemia, unspecified: Secondary | ICD-10-CM | POA: Diagnosis present

## 2023-06-11 DIAGNOSIS — E119 Type 2 diabetes mellitus without complications: Secondary | ICD-10-CM | POA: Insufficient documentation

## 2023-06-11 DIAGNOSIS — R531 Weakness: Secondary | ICD-10-CM | POA: Diagnosis present

## 2023-06-11 HISTORY — DX: Type 2 diabetes mellitus without complications: E11.9

## 2023-06-11 LAB — CBC WITH DIFFERENTIAL/PLATELET
Abs Immature Granulocytes: 0.01 10*3/uL (ref 0.00–0.07)
Basophils Absolute: 0 10*3/uL (ref 0.0–0.1)
Basophils Relative: 1 %
Eosinophils Absolute: 0 10*3/uL (ref 0.0–0.5)
Eosinophils Relative: 1 %
HCT: 28.2 % — ABNORMAL LOW (ref 39.0–52.0)
Hemoglobin: 8.6 g/dL — ABNORMAL LOW (ref 13.0–17.0)
Immature Granulocytes: 0 %
Lymphocytes Relative: 24 %
Lymphs Abs: 1 10*3/uL (ref 0.7–4.0)
MCH: 28.4 pg (ref 26.0–34.0)
MCHC: 30.5 g/dL (ref 30.0–36.0)
MCV: 93.1 fL (ref 80.0–100.0)
Monocytes Absolute: 0.6 10*3/uL (ref 0.1–1.0)
Monocytes Relative: 14 %
Neutro Abs: 2.5 10*3/uL (ref 1.7–7.7)
Neutrophils Relative %: 60 %
Platelets: 237 10*3/uL (ref 150–400)
RBC: 3.03 MIL/uL — ABNORMAL LOW (ref 4.22–5.81)
RDW: 21.6 % — ABNORMAL HIGH (ref 11.5–15.5)
WBC: 4.1 10*3/uL (ref 4.0–10.5)
nRBC: 1.7 % — ABNORMAL HIGH (ref 0.0–0.2)

## 2023-06-11 LAB — I-STAT VENOUS BLOOD GAS, ED
Acid-Base Excess: 1 mmol/L (ref 0.0–2.0)
Bicarbonate: 26.2 mmol/L (ref 20.0–28.0)
Calcium, Ion: 1.16 mmol/L (ref 1.15–1.40)
HCT: 28 % — ABNORMAL LOW (ref 39.0–52.0)
Hemoglobin: 9.5 g/dL — ABNORMAL LOW (ref 13.0–17.0)
O2 Saturation: 66 %
Potassium: 4.1 mmol/L (ref 3.5–5.1)
Sodium: 141 mmol/L (ref 135–145)
TCO2: 28 mmol/L (ref 22–32)
pCO2, Ven: 44.5 mm[Hg] (ref 44–60)
pH, Ven: 7.378 (ref 7.25–7.43)
pO2, Ven: 35 mm[Hg] (ref 32–45)

## 2023-06-11 LAB — URINALYSIS, W/ REFLEX TO CULTURE (INFECTION SUSPECTED)
Bilirubin Urine: NEGATIVE
Glucose, UA: NEGATIVE mg/dL
Hgb urine dipstick: NEGATIVE
Ketones, ur: NEGATIVE mg/dL
Nitrite: NEGATIVE
Protein, ur: NEGATIVE mg/dL
Specific Gravity, Urine: 1.016 (ref 1.005–1.030)
pH: 5 (ref 5.0–8.0)

## 2023-06-11 LAB — TROPONIN I (HIGH SENSITIVITY)
Troponin I (High Sensitivity): 54 ng/L — ABNORMAL HIGH (ref <18)
Troponin I (High Sensitivity): 49 ng/L — ABNORMAL HIGH (ref <18)

## 2023-06-11 LAB — BASIC METABOLIC PANEL
Anion gap: 11 (ref 5–15)
BUN: 12 mg/dL (ref 8–23)
CO2: 23 mmol/L (ref 22–32)
Calcium: 9 mg/dL (ref 8.9–10.3)
Chloride: 104 mmol/L (ref 98–111)
Creatinine, Ser: 1.34 mg/dL — ABNORMAL HIGH (ref 0.61–1.24)
GFR, Estimated: 52 mL/min — ABNORMAL LOW (ref >60)
Glucose, Bld: 85 mg/dL (ref 70–99)
Potassium: 4.1 mmol/L (ref 3.5–5.1)
Sodium: 138 mmol/L (ref 135–145)

## 2023-06-11 LAB — I-STAT CG4 LACTIC ACID, ED: Lactic Acid, Venous: 1.9 mmol/L (ref 0.5–1.9)

## 2023-06-11 LAB — T4, FREE: Free T4: 0.94 ng/dL (ref 0.61–1.12)

## 2023-06-11 LAB — MAGNESIUM: Magnesium: 2 mg/dL (ref 1.7–2.4)

## 2023-06-11 LAB — BRAIN NATRIURETIC PEPTIDE: B Natriuretic Peptide: 196.7 pg/mL — ABNORMAL HIGH (ref 0.0–100.0)

## 2023-06-11 LAB — CBG MONITORING, ED: Glucose-Capillary: 88 mg/dL (ref 70–99)

## 2023-06-11 LAB — TSH: TSH: 1.697 u[IU]/mL (ref 0.350–4.500)

## 2023-06-11 MED ORDER — ACETAMINOPHEN 325 MG PO TABS
650.0000 mg | ORAL_TABLET | Freq: Four times a day (QID) | ORAL | Status: DC | PRN
  Administered 2023-06-12 – 2023-06-13 (×2): 650 mg via ORAL
  Filled 2023-06-11 (×2): qty 2

## 2023-06-11 MED ORDER — ORAL CARE MOUTH RINSE: 15.0000 mL | OROMUCOSAL | Status: DC | PRN

## 2023-06-11 MED ORDER — ALBUTEROL SULFATE (2.5 MG/3ML) 0.083% IN NEBU: 3.0000 mL | INHALATION_SOLUTION | RESPIRATORY_TRACT | Status: DC | PRN

## 2023-06-11 MED ORDER — RIVAROXABAN 10 MG PO TABS
20.0000 mg | ORAL_TABLET | Freq: Every morning | ORAL | Status: DC
  Administered 2023-06-12 – 2023-06-14 (×3): 20 mg via ORAL
  Filled 2023-06-11 (×4): qty 2

## 2023-06-11 MED ORDER — METOPROLOL SUCCINATE ER 50 MG PO TB24
50.0000 mg | ORAL_TABLET | Freq: Every day | ORAL | Status: DC
  Administered 2023-06-12 – 2023-06-14 (×3): 50 mg via ORAL
  Filled 2023-06-11 (×3): qty 1

## 2023-06-11 MED ORDER — SODIUM CHLORIDE 0.9 % IV SOLN
2.0000 g | Freq: Once | INTRAVENOUS | Status: AC
  Administered 2023-06-11: 2 g via INTRAVENOUS
  Filled 2023-06-11: qty 20

## 2023-06-11 MED ORDER — TRAZODONE HCL 50 MG PO TABS
50.0000 mg | ORAL_TABLET | Freq: Every day | ORAL | Status: DC
Start: 2023-06-11 — End: 2023-06-14
  Administered 2023-06-11 – 2023-06-13 (×3): 50 mg via ORAL
  Filled 2023-06-11 (×3): qty 1

## 2023-06-11 MED ORDER — SODIUM CHLORIDE 0.9 % IV BOLUS
1000.0000 mL | Freq: Once | INTRAVENOUS | Status: AC
  Administered 2023-06-11: 1000 mL via INTRAVENOUS

## 2023-06-11 MED ORDER — BACLOFEN 10 MG PO TABS
10.0000 mg | ORAL_TABLET | Freq: Three times a day (TID) | ORAL | Status: DC
  Administered 2023-06-11 – 2023-06-14 (×8): 10 mg via ORAL
  Filled 2023-06-11 (×8): qty 1

## 2023-06-11 MED ORDER — ATORVASTATIN CALCIUM 40 MG PO TABS
40.0000 mg | ORAL_TABLET | Freq: Every day | ORAL | Status: DC
Start: 2023-06-11 — End: 2023-06-14
  Administered 2023-06-11 – 2023-06-13 (×3): 40 mg via ORAL
  Filled 2023-06-11 (×3): qty 1

## 2023-06-11 MED ORDER — PANTOPRAZOLE SODIUM 40 MG PO TBEC
40.0000 mg | DELAYED_RELEASE_TABLET | Freq: Two times a day (BID) | ORAL | Status: DC
  Administered 2023-06-12 – 2023-06-14 (×5): 40 mg via ORAL
  Filled 2023-06-11 (×5): qty 1

## 2023-06-11 MED ORDER — GABAPENTIN 300 MG PO CAPS
300.0000 mg | ORAL_CAPSULE | Freq: Three times a day (TID) | ORAL | Status: DC
  Administered 2023-06-11 – 2023-06-14 (×8): 300 mg via ORAL
  Filled 2023-06-11 (×8): qty 1

## 2023-06-11 MED ORDER — DULOXETINE HCL 30 MG PO CPEP
30.0000 mg | ORAL_CAPSULE | Freq: Two times a day (BID) | ORAL | Status: DC
  Administered 2023-06-11 – 2023-06-14 (×6): 30 mg via ORAL
  Filled 2023-06-11 (×6): qty 1

## 2023-06-11 NOTE — ED Notes (Signed)
Patient moved into the hallway for a few minutes to allow room for an EMS.

## 2023-06-11 NOTE — ED Notes (Signed)
ED TO INPATIENT HANDOFF REPORT  ED Nurse Name and Phone #: Jobe Igo  S Name/Age/Gender Tristan Gardner 88 y.o. male Room/Bed: 021C/021C  Code Status   Code Status: Full Code  Home/SNF/Other Home Patient oriented to: self, place, time, and situation Is this baseline? Yes   Triage Complete: Triage complete  Chief Complaint Hypotension [I95.9]  Triage Note PT was brought in by EMS from [facility]. Facility reported the patient was weak, lethargic, and altered. Initial bp was 60/40, EMS gave 500NS and bp increased to 90/40. Pt can follow directions and MAEx4. Pt is alert to self, time, and place. PT complains of frontal headache since EMS arrives and states it is 7/10. Pt sleepy but arousable. Pt is on xarelto. Complains of right shoulder and left knee pain that is not new. CBG 159 for EMS   Allergies Allergies  Allergen Reactions   Iodine Hives, Itching and Rash         Shellfish Allergy Hives    Level of Care/Admitting Diagnosis ED Disposition     ED Disposition  Admit   Condition  --   Comment  Hospital Area: [facility] [[id number]]  Level of Care: Med-Surg [16]  May place patient in observation at [facility] or [facility] if equivalent level of care is available:: Yes  Covid Evaluation: Asymptomatic - no recent exposure (last 10 days) testing not required  Diagnosis: Hypotension [962952]  Admitting Physician: Dorcas Carrow [[id number]]  Attending Physician: Dorcas Carrow [[id number]]          B Medical/Surgery History Past Medical History:  Diagnosis Date   Atrial fibrillation (HCC)    Coronary artery disease    Diabetes mellitus without complication (HCC)    Hypertension    NSTEMI (non-ST elevated myocardial infarction) (HCC)    Stroke [facility])    Past Surgical History:  Procedure Laterality Date   BIOPSY  10/23/2022   Procedure: BIOPSY;  Surgeon: [name], MD;  Location: [facility] ENDOSCOPY;  Service: Gastroenterology;;    CARDIAC SURGERY     COLONOSCOPY WITH PROPOFOL N/A 10/23/2022   Procedure: COLONOSCOPY WITH PROPOFOL;  Surgeon: [name], MD;  Location: [facility] ENDOSCOPY;  Service: Gastroenterology;  Laterality: N/A;   ENTEROSCOPY N/A 02/27/2023   Procedure: ENTEROSCOPY;  Surgeon: Lynann Bologna, DO;  Location: [facility] ENDOSCOPY;  Service: Gastroenterology;  Laterality: N/A;   ESOPHAGOGASTRODUODENOSCOPY (EGD) WITH PROPOFOL N/A 10/23/2022   Procedure: ESOPHAGOGASTRODUODENOSCOPY (EGD) WITH PROPOFOL;  Surgeon: [name], MD;  Location: [facility] ENDOSCOPY;  Service: Gastroenterology;  Laterality: N/A;   HEMOSTASIS CLIP PLACEMENT  02/27/2023   Procedure: HEMOSTASIS CLIP PLACEMENT;  Surgeon: Lynann Bologna, DO;  Location: [facility] ENDOSCOPY;  Service: Gastroenterology;;   IR INJECT/THERA/INC NEEDLE/CATH/PLC EPI/LUMB/SAC W/IMG  12/27/2022   LUMBAR LAMINECTOMY/DECOMPRESSION MICRODISCECTOMY N/A 01/03/2023   Procedure: Lumbar Two-Three, Lumbar Three-Four, Lumbar Four-Five Open laminectomies;  Surgeon: [name], MD;  Location: [facility] OR;  Service: Neurosurgery;  Laterality: N/A;   RADIOLOGY WITH ANESTHESIA N/A 12/27/2022   Procedure: TOTAL SPINE MYELOGRAM;  Surgeon: Radiologist, Medication, MD;  Location: [facility] OR;  Service: Radiology;  Laterality: N/A;   ROTATOR CUFF REPAIR       A IV Location/Drains/Wounds Patient Lines/Drains/Airways Status     Active Line/Drains/Airways     Name Placement date Placement time Site Days   Peripheral IV 06/11/23 20 G Anterior;Distal;Left;Upper Arm 06/11/23  --  Arm  less than 1            Intake/Output Last 24 hours No  intake or output data in the 24 hours ending 06/11/23 1751  Labs/Imaging Results for orders placed or performed during the hospital encounter of 06/11/23 (from the past 48 hour(s))  CBC with Differential     Status: Abnormal   Collection Time: 06/11/23 12:16 PM  Result Value Ref Range   WBC 4.1 4.0 - 10.5 K/uL   RBC 3.03 (L) 4.22 - 5.81 MIL/uL    Hemoglobin 8.6 (L) 13.0 - 17.0 g/dL   HCT 16.1 (L) 09.6 - 04.5 %   MCV 93.1 80.0 - 100.0 fL   MCH 28.4 26.0 - 34.0 pg   MCHC 30.5 30.0 - 36.0 g/dL   RDW 40.9 (H) 81.1 - 91.4 %   Platelets 237 150 - 400 K/uL   nRBC 1.7 (H) 0.0 - 0.2 %   Neutrophils Relative % 60 %   Neutro Abs 2.5 1.7 - 7.7 K/uL   Lymphocytes Relative 24 %   Lymphs Abs 1.0 0.7 - 4.0 K/uL   Monocytes Relative 14 %   Monocytes Absolute 0.6 0.1 - 1.0 K/uL   Eosinophils Relative 1 %   Eosinophils Absolute 0.0 0.0 - 0.5 K/uL   Basophils Relative 1 %   Basophils Absolute 0.0 0.0 - 0.1 K/uL   Immature Granulocytes 0 %   Abs Immature Granulocytes 0.01 0.00 - 0.07 K/uL    Comment: Performed at [facility], [address] [place], [place] [address]  Basic metabolic panel     Status: Abnormal   Collection Time: 06/11/23 12:16 PM  Result Value Ref Range   Sodium 138 135 - 145 mmol/L   Potassium 4.1 3.5 - 5.1 mmol/L    Comment: HEMOLYSIS AT THIS LEVEL MAY AFFECT RESULT   Chloride 104 98 - 111 mmol/L   CO2 23 22 - 32 mmol/L   Glucose, Bld 85 70 - 99 mg/dL    Comment: Glucose reference range applies only to samples taken after fasting for at least 8 hours.   BUN 12 8 - 23 mg/dL   Creatinine, Ser 6.21 (H) 0.61 - 1.24 mg/dL   Calcium 9.0 8.9 - 30.8 mg/dL   GFR, Estimated 52 (L) >60 mL/min    Comment: (NOTE) Calculated using the CKD-EPI Creatinine Equation (2021)    Anion gap 11 5 - 15    Comment: Performed at [facility], [address], [place], [place] [address]  Magnesium     Status: None   Collection Time: 06/11/23 12:16 PM  Result Value Ref Range   Magnesium 2.0 1.7 - 2.4 mg/dL    Comment: Performed at [facility], [address] [place], [place] [address]  T4, free     Status: None   Collection Time: 06/11/23 12:16 PM  Result Value Ref Range   Free T4 0.94 0.61 - 1.12 ng/dL    Comment: HEMOLYSIS AT THIS LEVEL MAY AFFECT RESULT (NOTE) Biotin ingestion may interfere with free T4 tests. If  the results are inconsistent with the TSH level, previous test results, or the clinical presentation, then consider biotin interference. If needed, order repeat testing after stopping biotin. Performed at [facility], [address] [place], [place] [address]   CBG monitoring, ED     Status: None   Collection Time: 06/11/23  1:15 PM  Result Value Ref Range   Glucose-Capillary 88 70 - 99 mg/dL    Comment: Glucose reference range applies only to samples taken after fasting for at least 8 hours.  Troponin I (High Sensitivity)     Status: Abnormal   Collection Time: 06/11/23  1:15 PM  Result Value Ref Range   Troponin I (High Sensitivity) 54 (H) <18 ng/L    Comment: (NOTE) Elevated high sensitivity troponin I (hsTnI) values and significant  changes across serial measurements may suggest ACS but many other  chronic and acute conditions are known to elevate hsTnI results.  Refer to the "Links" section for chest pain algorithms and additional  guidance. Performed at [facility], [address] [place], [place] [address]   TSH     Status: None   Collection Time: 06/11/23  1:17 PM  Result Value Ref Range   TSH 1.697 0.350 - 4.500 uIU/mL    Comment: Performed by a 3rd Generation assay with a functional sensitivity of <=0.01 uIU/mL. Performed at [facility], [address] [place], [place] [address]   Brain natriuretic peptide     Status: Abnormal   Collection Time: 06/11/23  1:18 PM  Result Value Ref Range   B Natriuretic Peptide 196.7 (H) 0.0 - 100.0 pg/mL    Comment: Performed at [facility], [address] [place], [place] [address]  I-Stat CG4 Lactic Acid     Status: None   Collection Time: 06/11/23  1:57 PM  Result Value Ref Range   Lactic Acid, Venous 1.9 0.5 - 1.9 mmol/L  I-Stat venous blood gas, ([facility] ED, [facility], [facility])     Status: Abnormal   Collection Time: 06/11/23  1:57 PM  Result Value Ref Range   pH, Ven 7.378 7.25 - 7.43   pCO2, Ven 44.5 44 -  60 mmHg   pO2, Ven 35 32 - 45 mmHg   Bicarbonate 26.2 20.0 - 28.0 mmol/L   TCO2 28 22 - 32 mmol/L   O2 Saturation 66 %   Acid-Base Excess 1.0 0.0 - 2.0 mmol/L   Sodium 141 135 - 145 mmol/L   Potassium 4.1 3.5 - 5.1 mmol/L   Calcium, Ion 1.16 1.15 - 1.40 mmol/L   HCT 28.0 (L) 39.0 - 52.0 %   Hemoglobin 9.5 (L) 13.0 - 17.0 g/dL   Sample type VENOUS    Comment NOTIFIED PHYSICIAN   Urinalysis, w/ Reflex to Culture (Infection Suspected) -Urine, Clean Catch     Status: Abnormal   Collection Time: 06/11/23  2:36 PM  Result Value Ref Range   Specimen Source URINE, CLEAN CATCH    Color, Urine YELLOW YELLOW   APPearance CLEAR CLEAR   Specific Gravity, Urine 1.016 1.005 - 1.030   pH 5.0 5.0 - 8.0   Glucose, UA NEGATIVE NEGATIVE mg/dL   Hgb urine dipstick NEGATIVE NEGATIVE   Bilirubin Urine NEGATIVE NEGATIVE   Ketones, ur NEGATIVE NEGATIVE mg/dL   Protein, ur NEGATIVE NEGATIVE mg/dL   Nitrite NEGATIVE NEGATIVE   Leukocytes,Ua SMALL (A) NEGATIVE   RBC / HPF 0-5 0 - 5 RBC/hpf   WBC, UA 11-20 0 - 5 WBC/hpf    Comment:        Reflex urine culture not performed if WBC <=10, OR if Squamous epithelial cells >5. If Squamous epithelial cells >5 suggest recollection.    Bacteria, UA RARE (A) NONE SEEN   Squamous Epithelial / HPF 0-5 0 - 5 /HPF   Mucus PRESENT    Hyaline Casts, UA PRESENT     Comment: Performed at [facility], [address] [place], [place] [address]   CT Head Wo Contrast  Result Date: 06/11/2023 CLINICAL DATA:  Headache, new onset (Age >= 51y) EXAM: CT HEAD WITHOUT CONTRAST TECHNIQUE: Contiguous axial images were obtained from the base of the skull through the vertex without intravenous contrast. RADIATION DOSE REDUCTION: This exam was performed according to the departmental dose-optimization program which includes automated exposure control, adjustment of the mA and/or kV according to patient size and/or use of iterative reconstruction technique. COMPARISON:  CT head  December 23, 2022. FINDINGS: Brain: No evidence of acute infarction, hemorrhage, hydrocephalus, extra-axial collection or mass lesion/mass effect. Similar patchy white matter hypodensities, compatible with chronic microvascular ischemic disease. Similar cerebral atrophy. Small remote right perirolandic infarct. Vascular: No hyperdense vessel identified Skull: No acute fracture. Sinuses/Orbits: Mostly clear sinuses. Other: Widespread retained metallic foreign body scattered in the scalp and also in the medial canthus of the left orbit. IMPRESSION: No evidence of acute intracranial abnormality. Electronically Signed   By: Feliberto Harts M.D.   On: 06/11/2023 15:46   DG Chest 2 View  Result Date: 06/11/2023 CLINICAL DATA:  Near syncope. EXAM: CHEST - 2 VIEW COMPARISON:  May 20, 2023. FINDINGS: The heart size and mediastinal contours are within normal limits. Both lungs are clear. The visualized skeletal structures are unremarkable. IMPRESSION: No active cardiopulmonary disease. Electronically Signed   By: Lupita Raider M.D.   On: 06/11/2023 14:35    Pending Labs Unresulted Labs (From admission, onward)     Start     Ordered   06/11/23 1545  Blood culture (routine x 2)  BLOOD CULTURE X 2,   R (with STAT occurrences)      06/11/23 1544   06/11/23 1436  Urine Culture  Once,   R        06/11/23 1436   Signed and Held  Magnesium  Tomorrow morning,   R        Signed and Held   Signed and Held  Phosphorus  Tomorrow morning,   R        Signed and Held   Signed and Held  Basic metabolic panel  Tomorrow morning,   R        Signed and Held   Signed and Held  CBC  Tomorrow morning,   R        Signed and Held            Vitals/Pain Today's Vitals   06/11/23 1425 06/11/23 1427 06/11/23 1430 06/11/23 1647  BP:   139/63   Pulse: 99 78 72   Resp: 16 17 11    Temp:    98.6 F (37 C)  TempSrc:    Oral  SpO2: 96% 98% 97%   PainSc:        Isolation Precautions No active  isolations  Medications Medications  cefTRIAXone (ROCEPHIN) 2 g in sodium chloride 0.9 % 100 mL IVPB (2 g Intravenous New Bag/Given 06/11/23 1732)  baclofen (LIORESAL) tablet 10 mg (has no administration in time range)  sodium chloride 0.9 % bolus 1,000 mL (1,000 mLs Intravenous New Bag/Given 06/11/23 1734)    Mobility walks     Focused Assessments Generalized weakness   R Recommendations: See Admitting Provider Note  Report given to:   Additional Notes: Pt has been AO, feeding himself, family at bedside, nothing has really popped out to as he was not as alert at his day program today.Marland KitchenMarland Kitchen

## 2023-06-11 NOTE — ED Notes (Signed)
Patient left the floor in stable condition with his belongings and staff.

## 2023-06-11 NOTE — ED Triage Notes (Signed)
PT was brought in by EMS from [facility]. Facility reported the patient was weak, lethargic, and altered. Initial bp was 60/40, EMS gave 500NS and bp increased to 90/40. Pt can follow directions and MAEx4. Pt is alert to self, time, and place. PT complains of frontal headache since EMS arrives and states it is 7/10. Pt sleepy but arousable. Pt is on xarelto. Complains of right shoulder and left knee pain that is not new. CBG 159 for EMS

## 2023-06-11 NOTE — ED Notes (Signed)
2 unsuccessful attempts for blood cultures.

## 2023-06-11 NOTE — ED Provider Notes (Signed)
Tristan Gardner Provider Note   CSN: [id number] Arrival date & time: 06/11/23  1139     History  Chief Complaint  Patient presents with   Weakness   Hypotension    Tristan Gardner is a 88 y.o. male with a PMH of A-fib on Xarelto, recent GI bleed, HTN who presented to the ED for weakness and hypotension.  Per granddaughter via telephone call, she reports that the patient lives with her.  She reports he woke up this morning and told her that he was "moving slow" but did not have any specific symptoms.  Reports he went to his day senior Gardner and there, he had an episode of being less responsive than usual.  Reports the facility called 911, who found his blood pressure to be 60/40 and they administered him 500 cc normal saline with an improvement in his blood pressure.  She reports that he was bedded to the hospital several weeks ago for GI bleed that was fixed.  Reports he restarted his Xarelto approximately 1 month prior.  Patient currently denies symptoms and states he feels okay.  He reports he did not eat breakfast this morning.  He denies nausea, vomiting, diarrhea.  Denies fevers, chills, chest pain, shortness of breath.  Denies falls or recent injury including hitting his head.  Endorses urinary frequency but denies dysuria or hematuria.  =   Weakness      Home Medications Prior to Admission medications   Medication Sig Start Date End Date Taking? Authorizing Provider  acetaminophen (TYLENOL) 325 MG tablet Take 2 tablets (650 mg total) by mouth every 6 (six) hours as needed for mild pain (or Fever >/= 101). 10/24/22   Elgergawy, [name], MD  albuterol (VENTOLIN HFA) 108 (90 Base) MCG/ACT inhaler Inhale 2 puffs into the lungs every 4 (four) hours as needed for wheezing or shortness of breath. 02/24/20   [provider]  atorvastatin (LIPITOR) 40 MG tablet Take 40 mg by mouth at bedtime. 08/24/22   [provider]  baclofen  (LIORESAL) 10 MG tablet Take 10 mg by mouth 3 (three) times daily.    [provider]  DULoxetine (CYMBALTA) 30 MG capsule Take 30 mg by mouth 2 (two) times daily.    [provider]  gabapentin (NEURONTIN) 300 MG capsule Take 300 mg by mouth 3 (three) times daily.    [provider]  isosorbide mononitrate (IMDUR) 30 MG 24 hr tablet Take 30 mg by mouth daily. 08/24/22   [provider]  losartan (COZAAR) 25 MG tablet Take 1 tablet (25 mg total) by mouth daily. 10/26/22   Elgergawy, [name], MD  metoprolol succinate (TOPROL-XL) 50 MG 24 hr tablet Take 50 mg by mouth daily. 08/24/22   [provider]  pantoprazole (PROTONIX) 40 MG tablet Take 1 tablet (40 mg total) by mouth 2 (two) times daily before a meal. 03/02/23 05/20/23  Lewie Chamber, MD  traZODone (DESYREL) 50 MG tablet Take 50 mg by mouth at bedtime.    [provider]  XARELTO 20 MG TABS tablet Take 1 tablet (20 mg total) by mouth every morning. START ON 7/10 01/10/23   Ghimire, Werner Lean, MD      Allergies    Iodine and Shellfish allergy    Review of Systems   Review of Systems  Neurological:  Positive for weakness.    Physical Exam Updated Vital Signs BP 139/63   Pulse 72   Temp 98  F (36.7 C) (Oral)   Resp 11   SpO2 97%  Physical Exam Constitutional:      General: He is not in acute distress.    Appearance: Normal appearance. He is not ill-appearing.  HENT:     Head: Normocephalic and atraumatic.     Nose: Nose normal.     Mouth/Throat:     Mouth: Mucous membranes are moist.     Pharynx: Oropharynx is clear.  Eyes:     Pupils: Pupils are equal, round, and reactive to light.  Cardiovascular:     Rate and Rhythm: Regular rhythm. Bradycardia present.     Heart sounds: Normal heart sounds. No murmur heard.    No friction rub. No gallop.  Pulmonary:     Breath sounds: Normal breath sounds. No stridor. No wheezing, rhonchi or rales.  Abdominal:     Palpations:  Abdomen is soft.     Tenderness: There is no abdominal tenderness. There is no guarding or rebound.  Musculoskeletal:     Cervical back: Normal range of motion and neck supple.     Right lower leg: Edema present.     Left lower leg: Edema present.     Comments: Left lower rib cage tenderness to palpation  Skin:    General: Skin is warm and dry.     Capillary Refill: Capillary refill takes less than 2 seconds.  Neurological:     General: No focal deficit present.     Mental Status: He is alert.     ED Results / Procedures / Treatments   Labs (all labs ordered are listed, but only abnormal results are displayed) Labs Reviewed  CBC WITH DIFFERENTIAL/PLATELET - Abnormal; Notable for the following components:      Result Value   RBC 3.03 (*)    Hemoglobin 8.6 (*)    HCT 28.2 (*)    RDW 21.6 (*)    nRBC 1.7 (*)    All other components within normal limits  BASIC METABOLIC PANEL - Abnormal; Notable for the following components:   Creatinine, Ser 1.34 (*)    GFR, Estimated 52 (*)    All other components within normal limits  BRAIN NATRIURETIC PEPTIDE - Abnormal; Notable for the following components:   B Natriuretic Peptide 196.7 (*)    All other components within normal limits  URINALYSIS, W/ REFLEX TO CULTURE (INFECTION SUSPECTED) - Abnormal; Notable for the following components:   Leukocytes,Ua SMALL (*)    Bacteria, UA RARE (*)    All other components within normal limits  I-STAT VENOUS BLOOD GAS, ED - Abnormal; Notable for the following components:   HCT 28.0 (*)    Hemoglobin 9.5 (*)    All other components within normal limits  TROPONIN I (HIGH SENSITIVITY) - Abnormal; Notable for the following components:   Troponin I (High Sensitivity) 54 (*)    All other components within normal limits  URINE CULTURE  CULTURE, BLOOD (ROUTINE X 2)  CULTURE, BLOOD (ROUTINE X 2)  MAGNESIUM  TSH  T4, FREE  CBG MONITORING, ED  I-STAT CG4 LACTIC ACID, ED  TROPONIN I (HIGH SENSITIVITY)     EKG None  Radiology CT Head Wo Contrast  Result Date: 06/11/2023 CLINICAL DATA:  Headache, new onset (Age >= 51y) EXAM: CT HEAD WITHOUT CONTRAST TECHNIQUE: Contiguous axial images were obtained from the base of the skull through the vertex without intravenous contrast. RADIATION DOSE REDUCTION: This exam was performed according to the departmental dose-optimization program which includes automated  exposure control, adjustment of the mA and/or kV according to patient size and/or use of iterative reconstruction technique. COMPARISON:  CT head December 23, 2022. FINDINGS: Brain: No evidence of acute infarction, hemorrhage, hydrocephalus, extra-axial collection or mass lesion/mass effect. Similar patchy white matter hypodensities, compatible with chronic microvascular ischemic disease. Similar cerebral atrophy. Small remote right perirolandic infarct. Vascular: No hyperdense vessel identified Skull: No acute fracture. Sinuses/Orbits: Mostly clear sinuses. Other: Widespread retained metallic foreign body scattered in the scalp and also in the medial canthus of the left orbit. IMPRESSION: No evidence of acute intracranial abnormality. Electronically Signed   By: Feliberto Harts M.D.   On: 06/11/2023 15:46   DG Chest 2 View  Result Date: 06/11/2023 CLINICAL DATA:  Near syncope. EXAM: CHEST - 2 VIEW COMPARISON:  May 20, 2023. FINDINGS: The heart size and mediastinal contours are within normal limits. Both lungs are clear. The visualized skeletal structures are unremarkable. IMPRESSION: No active cardiopulmonary disease. Electronically Signed   By: Lupita Raider M.D.   On: 06/11/2023 14:35    Procedures Procedures    Medications Ordered in ED Medications  cefTRIAXone (ROCEPHIN) 2 g in sodium chloride 0.9 % 100 mL IVPB (has no administration in time range)  sodium chloride 0.9 % bolus 1,000 mL (has no administration in time range)    ED Course/ Medical Decision Making/ A&P Clinical Course as  of 06/11/23 1643  Mon Jun 11, 2023  1544 Complicated UTI [CC]  1603 Admit for complicated UTI Arrived for hypotension, AMS  Was 60/40 w/ EMS, soft BP on arrival [JR]    Clinical Course User Index [CC] Glyn Ade, MD [JR] Rolla Flatten, MD                                 Medical Decision Making Amount and/or Complexity of Data Reviewed Labs: ordered. Radiology: ordered.  Risk Decision regarding hospitalization.   Vital signs stable in the ED, initial blood pressure 97/69.  Physical exam is overall reassuring.  CBC with no leukocytosis, anemia slightly below his baseline.  Metabolic panel with no gross metabolic or electrolyte abnormality.  Initial troponin 54.  BNP 196.  Lactic acid 1.9.  Blood gas with pH 7.38, pCO2 44.5.  UA with small leukocyte Estrace and rare bacteria.  Urine culture sent.  In the setting of hypotension, possible altered mental status, and urinary frequency, I feel this may represent a UTI.  Blood cultures were ordered and Rocephin was initiated.  Given the patient's age and comorbidities in addition to hypotension earlier, I feel it is in the patient's best interest to be observed in the hospital.  Patient was signed out to the oncoming physician in stable condition, pending admission to medicine.        Final Clinical Impression(s) / ED Diagnoses Final diagnoses:  Acute cystitis without hematuria    Rx / DC Orders ED Discharge Orders     None         Janyth Pupa, MD 06/11/23 1643    Blane Ohara, MD 06/12/23 [phone]

## 2023-06-11 NOTE — H&P (Signed)
History and Physical    Derrill Surti ZOX:[id number] DOB: [birth year] DOA: 06/11/2023  PCP: [name], MD  Patient coming from: Daycare center.  Lives at home with current daughter.  I have personally briefly reviewed patient's old medical records available.   Chief Complaint: Found slow responsive and low blood pressure.  HPI: Tristan Gardner is a 88 y.o. male with medical history significant of chronic A-fib on Xarelto, coronary artery disease, history of stroke, hypertension, hyperlipidemia, history of GI bleeding but currently stable who was at daycare center and was found to be lethargic, confused.  Blood pressure was 60/40.  Blood pressure improved with IV fluids and patient became more alert and awake.  He was more sleepy than usual so he was brought to the ER.  Patient does have chronic right shoulder pain and left knee pain but denied any other complaints.  Patient is poor historian.  Patient does not remember or recall why they sent him to the ER.   No recent fever or chills.  No recent flulike symptoms.  Denies any nausea vomiting.  He had been in the eating regular at home, however not sure if he missed his breakfast today at daycare.  Denies any diarrhea.  Denies any dysuria or pain.  He does complain of frequent urination but this is nothing new for him. ED Course: Blood pressure stable.  Bradycardic that is chronic issue.Hemoglobin 9.5 and stable.  Electrolytes are stable.  EKG shows chronic A-fib.  CT head with no acute findings.  Chest x-ray with no acute findings.  Urine was essentially normal.  Patient receiving 1 L of normal saline in the ER.  Due to significant symptoms on presentation admission requested. Thought to be UTI so he was given a dose of Rocephin in the ER.  Review of Systems: all systems are reviewed and pertinent positive as per HPI otherwise rest are negative.    Past Medical History:  Diagnosis Date   Atrial fibrillation (HCC)    Coronary artery disease     Diabetes mellitus without complication (HCC)    Hypertension    NSTEMI (non-ST elevated myocardial infarction) (HCC)    Stroke [facility])     Past Surgical History:  Procedure Laterality Date   BIOPSY  10/23/2022   Procedure: BIOPSY;  Surgeon: [name], MD;  Location: [facility] ENDOSCOPY;  Service: Gastroenterology;;   CARDIAC SURGERY     COLONOSCOPY WITH PROPOFOL N/A 10/23/2022   Procedure: COLONOSCOPY WITH PROPOFOL;  Surgeon: [name], MD;  Location: [facility] ENDOSCOPY;  Service: Gastroenterology;  Laterality: N/A;   ENTEROSCOPY N/A 02/27/2023   Procedure: ENTEROSCOPY;  Surgeon: Lynann Bologna, DO;  Location: [facility] ENDOSCOPY;  Service: Gastroenterology;  Laterality: N/A;   ESOPHAGOGASTRODUODENOSCOPY (EGD) WITH PROPOFOL N/A 10/23/2022   Procedure: ESOPHAGOGASTRODUODENOSCOPY (EGD) WITH PROPOFOL;  Surgeon: [name], MD;  Location: [facility] ENDOSCOPY;  Service: Gastroenterology;  Laterality: N/A;   HEMOSTASIS CLIP PLACEMENT  02/27/2023   Procedure: HEMOSTASIS CLIP PLACEMENT;  Surgeon: Lynann Bologna, DO;  Location: [facility] ENDOSCOPY;  Service: Gastroenterology;;   IR INJECT/THERA/INC NEEDLE/CATH/PLC EPI/LUMB/SAC W/IMG  12/27/2022   LUMBAR LAMINECTOMY/DECOMPRESSION MICRODISCECTOMY N/A 01/03/2023   Procedure: Lumbar Two-Three, Lumbar Three-Four, Lumbar Four-Five Open laminectomies;  Surgeon: [name], MD;  Location: [facility] OR;  Service: Neurosurgery;  Laterality: N/A;   RADIOLOGY WITH ANESTHESIA N/A 12/27/2022   Procedure: TOTAL SPINE MYELOGRAM;  Surgeon: Radiologist, Medication, MD;  Location: [facility] OR;  Service: Radiology;  Laterality: N/A;   ROTATOR CUFF REPAIR      Social  history   reports that he has quit smoking. He has never used smokeless tobacco. He reports that he does not drink alcohol and does not use drugs.  Allergies  Allergen Reactions   Iodine Hives, Itching and Rash         Shellfish Allergy Hives    Family History  Problem Relation Age of Onset   Diabetes Mother     Heart attack Father 97   Hypertension Brother    Heart disease Brother    Diabetes Brother      Prior to Admission medications   Medication Sig Start Date End Date Taking? Authorizing Provider  acetaminophen (TYLENOL) 325 MG tablet Take 2 tablets (650 mg total) by mouth every 6 (six) hours as needed for mild pain (or Fever >/= 101). 10/24/22  Yes Elgergawy, [name], MD  albuterol (VENTOLIN HFA) 108 (90 Base) MCG/ACT inhaler Inhale 2 puffs into the lungs every 4 (four) hours as needed for wheezing or shortness of breath. 02/24/20  Yes [provider]  atorvastatin (LIPITOR) 40 MG tablet Take 40 mg by mouth at bedtime. 08/24/22  Yes [provider]  baclofen (LIORESAL) 10 MG tablet Take 10 mg by mouth 3 (three) times daily.   Yes [provider]  DULoxetine (CYMBALTA) 30 MG capsule Take 30 mg by mouth 2 (two) times daily.   Yes [provider]  gabapentin (NEURONTIN) 300 MG capsule Take 300 mg by mouth 3 (three) times daily.   Yes [provider]  isosorbide mononitrate (IMDUR) 30 MG 24 hr tablet Take 30 mg by mouth daily. 08/24/22  Yes [provider]  losartan (COZAAR) 25 MG tablet Take 1 tablet (25 mg total) by mouth daily. 10/26/22  Yes Elgergawy, [name], MD  metoprolol succinate (TOPROL-XL) 50 MG 24 hr tablet Take 50 mg by mouth daily. 08/24/22  Yes [provider]  traZODone (DESYREL) 50 MG tablet Take 50 mg by mouth at bedtime.   Yes [provider]  XARELTO 20 MG TABS tablet Take 1 tablet (20 mg total) by mouth every morning. START ON 7/10 01/10/23  Yes Kathryn Cosby, Werner Lean, MD  pantoprazole (PROTONIX) 40 MG tablet Take 1 tablet (40 mg total) by mouth 2 (two) times daily before a meal. Patient not taking: Reported on 06/11/2023 03/02/23 06/11/23  Lewie Chamber, MD    Physical Exam: Vitals:   06/11/23 1425 06/11/23 1427 06/11/23 1430 06/11/23 1647  BP:   139/63   Pulse: 99 78 72   Resp: 16 17 11    Temp:    98.6 F  (37 C)  TempSrc:    Oral  SpO2: 96% 98% 97%     Constitutional: NAD, calm, comfortable.  Pleasant interaction. Vitals:   06/11/23 1425 06/11/23 1427 06/11/23 1430 06/11/23 1647  BP:   139/63   Pulse: 99 78 72   Resp: 16 17 11    Temp:    98.6 F (37 C)  TempSrc:    Oral  SpO2: 96% 98% 97%    Eyes: PERRL, lids and conjunctivae normal ENMT: Mucous membranes are moist. Posterior pharynx clear of any exudate or lesions.Normal dentition.  Neck: normal, supple, no masses, no thyromegaly Respiratory: clear to auscultation bilaterally, no wheezing, no crackles. Normal respiratory effort. No accessory muscle use.  Cardiovascular: Regular rate and rhythm, no murmurs / rubs / gallops. No extremity edema. 2+ pedal pulses. No carotid bruits.  Abdomen: no tenderness, no masses palpated. No hepatosplenomegaly. Bowel sounds positive.  Musculoskeletal: no clubbing /  cyanosis. No joint deformity upper and lower extremities. Good ROM, no contractures. Normal muscle tone.  Skin: no rashes, lesions, ulcers. No induration Neurologic: CN 2-12 grossly intact. Sensation intact, DTR normal. Strength 5/5 in all 4.  Psychiatric: Normal judgment and insight. Alert and oriented x 3. Normal mood.     Labs on Admission: I have personally reviewed following labs and imaging studies  CBC: Recent Labs  Lab 06/11/23 1216 06/11/23 1357  WBC 4.1  --   NEUTROABS 2.5  --   HGB 8.6* 9.5*  HCT 28.2* 28.0*  MCV 93.1  --   PLT 237  --    Basic Metabolic Panel: Recent Labs  Lab 06/11/23 1216 06/11/23 1357  NA 138 141  K 4.1 4.1  CL 104  --   CO2 23  --   GLUCOSE 85  --   BUN 12  --   CREATININE 1.34*  --   CALCIUM 9.0  --   MG 2.0  --    GFR: Estimated Creatinine Clearance: 51.8 mL/min (A) (by C-G formula based on SCr of 1.34 mg/dL (H)). Liver Function Tests: No results for input(s): "AST", "ALT", "ALKPHOS", "BILITOT", "PROT", "ALBUMIN" in the last 168 hours. No results for input(s): "LIPASE",  "AMYLASE" in the last 168 hours. No results for input(s): "AMMONIA" in the last 168 hours. Coagulation Profile: No results for input(s): "INR", "PROTIME" in the last 168 hours. Cardiac Enzymes: No results for input(s): "CKTOTAL", "CKMB", "CKMBINDEX", "TROPONINI" in the last 168 hours. BNP (last 3 results) No results for input(s): "PROBNP" in the last 8760 hours. HbA1C: No results for input(s): "HGBA1C" in the last 72 hours. CBG: Recent Labs  Lab 06/11/23 1315  GLUCAP 88   Lipid Profile: No results for input(s): "CHOL", "HDL", "LDLCALC", "TRIG", "CHOLHDL", "LDLDIRECT" in the last 72 hours. Thyroid Function Tests: Recent Labs    06/11/23 1216 06/11/23 1317  TSH  --  1.697  FREET4 0.94  --    Anemia Panel: No results for input(s): "VITAMINB12", "FOLATE", "FERRITIN", "TIBC", "IRON", "RETICCTPCT" in the last 72 hours. Urine analysis:    Component Value Date/Time   COLORURINE YELLOW 06/11/2023 1436   APPEARANCEUR CLEAR 06/11/2023 1436   LABSPEC 1.016 06/11/2023 1436   PHURINE 5.0 06/11/2023 1436   GLUCOSEU NEGATIVE 06/11/2023 1436   HGBUR NEGATIVE 06/11/2023 1436   BILIRUBINUR NEGATIVE 06/11/2023 1436   KETONESUR NEGATIVE 06/11/2023 1436   PROTEINUR NEGATIVE 06/11/2023 1436   NITRITE NEGATIVE 06/11/2023 1436   LEUKOCYTESUR SMALL (A) 06/11/2023 1436    Radiological Exams on Admission: CT Head Wo Contrast  Result Date: 06/11/2023 CLINICAL DATA:  Headache, new onset (Age >= 51y) EXAM: CT HEAD WITHOUT CONTRAST TECHNIQUE: Contiguous axial images were obtained from the base of the skull through the vertex without intravenous contrast. RADIATION DOSE REDUCTION: This exam was performed according to the departmental dose-optimization program which includes automated exposure control, adjustment of the mA and/or kV according to patient size and/or use of iterative reconstruction technique. COMPARISON:  CT head December 23, 2022. FINDINGS: Brain: No evidence of acute infarction, hemorrhage,  hydrocephalus, extra-axial collection or mass lesion/mass effect. Similar patchy white matter hypodensities, compatible with chronic microvascular ischemic disease. Similar cerebral atrophy. Small remote right perirolandic infarct. Vascular: No hyperdense vessel identified Skull: No acute fracture. Sinuses/Orbits: Mostly clear sinuses. Other: Widespread retained metallic foreign body scattered in the scalp and also in the medial canthus of the left orbit. IMPRESSION: No evidence of acute intracranial abnormality. Electronically Signed   By: Thornton Dales  Yetta Barre M.D.   On: 06/11/2023 15:46   DG Chest 2 View  Result Date: 06/11/2023 CLINICAL DATA:  Near syncope. EXAM: CHEST - 2 VIEW COMPARISON:  May 20, 2023. FINDINGS: The heart size and mediastinal contours are within normal limits. Both lungs are clear. The visualized skeletal structures are unremarkable. IMPRESSION: No active cardiopulmonary disease. Electronically Signed   By: Lupita Raider M.D.   On: 06/11/2023 14:35    EKG: Independently reviewed.  Atrial fibrillation.  Heart rate was 40.  Assessment/Plan Principal Problem:   Hypotension Active Problems:   Essential hypertension   Dyslipidemia   Atrial fibrillation, chronic (HCC)     1.  Hypotension: Primary cause unknown.  Suspect dehydration.  Suspect orthostatic drop in blood pressure. Patient received 500 cc by EMS, additional 1 L of isotonic fluid now.  Hold further IV fluids.  Check orthostatic blood pressures.  Mobilize with PT OT.  Holding all antihypertensives except metoprolol for rate control.  2.  Essential hypertension: Patient is on nitrates, metoprolol and losartan.  Probably not tolerating all medications.  Will continue metoprolol but hold on nitrates and losartan.  3.  Chronic pain: Patient is on baclofen and Tylenol.  Continue.  4.  Chronic A-fib: Rate controlled.  On metoprolol that is continued.  Therapeutic on Xarelto.  No evidence of GI bleeding.  Continue  Xarelto.  He is on PPI for protection.  5.  Suspected UTI: Less likely.  Received 1 dose of Rocephin.  Will hold off on further antibiotics however will collect urine cultures and blood cultures.  DVT prophylaxis: Xarelto Code Status: Full code Family Communication: Granddaughter at the bedside Disposition Plan: Home Consults called: None Admission status: MedSurg.  Observation.   Dorcas Carrow MD [facility] Pager [phone]

## 2023-06-11 NOTE — ED Provider Triage Note (Signed)
Emergency Medicine Provider Triage Evaluation Note  Tristan Gardner , a 88 y.o. male  was evaluated in triage.  Pt complains of an episode where he did not feel well.  Reportedly was altered and hypotensive.  Now feels much better and is back to normal..  Review of Systems  Positive: Not feeling well, hypotension Negative:   Physical Exam  There were no vitals taken for this visit. Gen:   Awake, no distress   Resp:  Normal effort  MSK:   Moves extremities without difficulty  Other:    Medical Decision Making  Medically screening exam initiated at 12:26 PM.  Appropriate orders placed.  Tristan Gardner was informed that the remainder of the evaluation will be completed by another provider, this initial triage assessment does not replace that evaluation, and the importance of remaining in the ED until their evaluation is complete.  Perhaps this was a vasovagal episode as the patient was hypotensive and now feels much better.  Will obtain basic blood work.   Melene Plan, DO 06/11/23 1226

## 2023-06-12 DIAGNOSIS — E861 Hypovolemia: Secondary | ICD-10-CM | POA: Diagnosis not present

## 2023-06-12 LAB — CBC
HCT: 26.5 % — ABNORMAL LOW (ref 39.0–52.0)
Hemoglobin: 8.4 g/dL — ABNORMAL LOW (ref 13.0–17.0)
MCH: 29.1 pg (ref 26.0–34.0)
MCHC: 31.7 g/dL (ref 30.0–36.0)
MCV: 91.7 fL (ref 80.0–100.0)
Platelets: 199 10*3/uL (ref 150–400)
RBC: 2.89 MIL/uL — ABNORMAL LOW (ref 4.22–5.81)
RDW: 21.5 % — ABNORMAL HIGH (ref 11.5–15.5)
WBC: 3.7 10*3/uL — ABNORMAL LOW (ref 4.0–10.5)
nRBC: 0.8 % — ABNORMAL HIGH (ref 0.0–0.2)

## 2023-06-12 LAB — BASIC METABOLIC PANEL
Anion gap: 8 (ref 5–15)
BUN: 10 mg/dL (ref 8–23)
CO2: 22 mmol/L (ref 22–32)
Calcium: 8.6 mg/dL — ABNORMAL LOW (ref 8.9–10.3)
Chloride: 108 mmol/L (ref 98–111)
Creatinine, Ser: 1.04 mg/dL (ref 0.61–1.24)
GFR, Estimated: >60 mL/min (ref >60)
Glucose, Bld: 109 mg/dL — ABNORMAL HIGH (ref 70–99)
Potassium: 3.6 mmol/L (ref 3.5–5.1)
Sodium: 138 mmol/L (ref 135–145)

## 2023-06-12 LAB — MAGNESIUM: Magnesium: 1.9 mg/dL (ref 1.7–2.4)

## 2023-06-12 LAB — URINE CULTURE

## 2023-06-12 LAB — PHOSPHORUS: Phosphorus: 4.2 mg/dL (ref 2.5–4.6)

## 2023-06-12 NOTE — Evaluation (Signed)
Physical Therapy Evaluation Patient Details Name: Tristan Gardner MRN: [id number] DOB: 01-03-1935 Today's Date: 06/12/2023  History of Present Illness  Tristan Gardner is a 88 y.o. male  who was at daycare center [facility]) and was found to be lethargic, confused.  Blood pressure was 60/40, improved with IV fluids; with medical history significant of chronic A-fib on Xarelto, coronary artery disease, history of stroke, hypertension, hyperlipidemia, history of GI bleeding but currently stable, decompressive laminectomies June 2024  Clinical Impression   Pt admitted with above diagnosis. Lives at home with granddaughter, attends [employer] most of the day, and has an aide who comes weekdays for 2 hours in the evening; Prior to admission, pt was able to waling modified independenlty with rollator - notable that he was walking 150 ft with rollator with PT last admission (05/21/23); Presents to PT with a significant funcitonal decline compared to his baseline, decr activity tolerance, shoulder/knee/back pain limiting activity tolerance, possible orthostatic hypotension; At this point, rec post acute rehab to maximize independence and safety with mobility and ADLs prior to return home;  Pt currently with functional limitations due to the deficits listed below (see PT Problem List). Pt will benefit from skilled PT to increase their independence and safety with mobility to allow discharge to the venue listed below.       Attempted to get a set of orthostatic vitals:    06/12/23 1000 06/12/23 1005  Vital Signs  Patient Position (if appropriate) Orthostatic Vitals  --   Orthostatic Sitting  BP- Sitting 121/69 (MAP 85) 108/68 (MAP 80)  Pulse- Sitting 78 (in recliner, feet up) 80 (BP reading taken immediately after a bout with upright standing/walking)  Orthostatic Standing at 0 minutes  BP- Standing at 0 minutes  (Attempted, but did not get a standing reading) Possible that BP was lower than  108/68, which was BP taken immediately up on sitting  --          If plan is discharge home, recommend the following: A lot of help with walking and/or transfers   Can travel by private vehicle   No    Equipment Recommendations None recommended by PT (can defer to next venue)  Recommendations for Other Services       Functional Status Assessment Patient has had a recent decline in their functional status and demonstrates the ability to make significant improvements in function in a reasonable and predictable amount of time.     Precautions / Restrictions Precautions Precautions: Fall Restrictions Weight Bearing Restrictions: No      Mobility  Bed Mobility Overal bed mobility: Needs Assistance Bed Mobility: Supine to Sit     Supine to sit: Min assist     General bed mobility comments: incr time and assist to scoot EOB    Transfers Overall transfer level: Needs assistance Equipment used: Rolling walker (2 wheels) Transfers: Sit to/from Stand Sit to Stand: Min assist, Mod assist           General transfer comment: cues to push from armrests; Mod assist to power up; did not get to fully upright standing    Ambulation/Gait Ambulation/Gait assistance: Min assist, +2 safety/equipment Gait Distance (Feet): 8 Feet Assistive device: Rolling walker (2 wheels) Gait Pattern/deviations: Step-to pattern, Decreased step length - right, Shuffle, Trunk flexed (Hips flexed)       General Gait Details: Assist for safety. Cues to stand more upright and closer to walker; Bil hips flexed throughout gait cycle; difficulty with LLE stance  and decr RLE step length; reports of dizziness during amb; unable to get standing BP read  Stairs            Wheelchair Mobility     Tilt Bed    Modified Rankin (Stroke Patients Only)       Balance Overall balance assessment: Needs assistance Sitting-balance support: No upper extremity supported, Feet supported Sitting  balance-Leahy Scale: Fair     Standing balance support: Bilateral upper extremity supported, Single extremity supported, Reliant on assistive device for balance Standing balance-Leahy Scale: Poor Standing balance comment: reliant on UE support                             Pertinent Vitals/Pain Pain Assessment Pain Assessment: Faces Faces Pain Scale: Hurts little more Pain Location: L Leg and R shoulder; back pain Pain Descriptors / Indicators: Discomfort Pain Intervention(s): Monitored during session    Home Living Family/patient expects to be discharged to:: Private residence Living Arrangements: Other relatives (granddaughter) Available Help at Discharge: Family;Available PRN/intermittently Type of Home: House Home Access: Level entry       Home Layout: One level Home Equipment: Rollator (4 wheels);Shower seat Additional Comments: aide M-F 3 hours 6pm-8pm    Prior Function Prior Level of Function : Needs assist             Mobility Comments: Modified independent with rollator ADLs Comments: Aide helps with bathing, meals     Extremity/Trunk Assessment   Upper Extremity Assessment Upper Extremity Assessment: Defer to OT evaluation    Lower Extremity Assessment Lower Extremity Assessment: Generalized weakness    Cervical / Trunk Assessment Cervical / Trunk Assessment: Other exceptions Cervical / Trunk Exceptions: Noting difficulty getting to fully upright standing; tends to keep hips significantly flexed in standing and throughout gait cycle  Communication   Communication Communication: Hearing impairment Cueing Techniques: Verbal cues  Cognition Arousal: Alert Behavior During Therapy: WFL for tasks assessed/performed Overall Cognitive Status: No family/caregiver present to determine baseline cognitive functioning                                          General Comments General comments (skin integrity, edema, etc.):      Exercises     Assessment/Plan    PT Assessment Patient needs continued PT services  PT Problem List Decreased strength;Decreased activity tolerance;Decreased balance;Decreased mobility       PT Treatment Interventions DME instruction;Gait training;Functional mobility training;Therapeutic activities;Therapeutic exercise;Balance training;Patient/family education    PT Goals (Current goals can be found in the Care Plan section)  Acute Rehab PT Goals Patient Stated Goal: did not specifically state, agrees to getting up and wlaing PT Goal Formulation: With patient Time For Goal Achievement: 06/26/23 Potential to Achieve Goals: Good    Frequency Min 1X/week     Co-evaluation               AM-PAC PT "6 Clicks" Mobility  Outcome Measure Help needed turning from your back to your side while in a flat bed without using bedrails?: A Little Help needed moving from lying on your back to sitting on the side of a flat bed without using bedrails?: A Little Help needed moving to and from a bed to a chair (including a wheelchair)?: A Lot Help needed standing up from a chair using your arms (e.g., wheelchair  or bedside chair)?: A Lot Help needed to walk in hospital room?: A Lot Help needed climbing 3-5 steps with a railing? : A Lot 6 Click Score: 14    End of Session Equipment Utilized During Treatment: Gait belt Activity Tolerance: Patient tolerated treatment well Patient left: in chair;with call bell/phone within reach;with chair alarm set Nurse Communication: Mobility status PT Visit Diagnosis: Unsteadiness on feet (R26.81);Other abnormalities of gait and mobility (R26.89);Muscle weakness (generalized) (M62.81)    Time: 5573-2202 PT Time Calculation (min) (ACUTE ONLY): 34 min   Charges:   PT Evaluation $PT Eval Moderate Complexity: 1 Mod PT Treatments $Gait Training: 8-22 mins PT General Charges $$ ACUTE PT VISIT: 1 Visit         Van Clines, PT  Acute  Rehabilitation Services Office [phone] Secure Chat welcomed   Levi Aland 06/12/2023, 10:58 AM

## 2023-06-12 NOTE — Progress Notes (Signed)
   06/12/23 1635  Mobility  Activity Transferred from chair to bed  Level of Assistance Minimal assist, patient does 75% or more  Assistive Device Front wheel walker  Activity Response Tolerated fair  Mobility Referral Yes  Mobility visit 1 Mobility  Mobility Specialist Start Time (ACUTE ONLY) 1615  Mobility Specialist Stop Time (ACUTE ONLY) 1635  Mobility Specialist Time Calculation (min) (ACUTE ONLY) 20 min   Mobility Specialist: Progress Note  Pt agreeable to mobility session - received in chair. Required MinA using RW. C/o dizziness STS, LLE spasms, and R shoulder pain - RN notified via securechat Returned to bed with all needs met - call bell within reach. Bed alarm on.   Barnie Mort, BS Mobility Specialist Please contact via SecureChat or Rehab office at [phone].

## 2023-06-12 NOTE — Progress Notes (Signed)
PROGRESS NOTE    Tristan Gardner  UJW:[id number] DOB: 21-Sep-1934 DOA: 06/11/2023 PCP: [name], MD    Brief Narrative:  88 y.o. male with medical history significant of chronic A-fib on Xarelto, coronary artery disease, history of stroke, hypertension, hyperlipidemia, history of GI bleeding but currently stable who was at daycare center and was found to be lethargic, confused.  Blood pressure was 60/40.  Blood pressure improved with IV fluids and patient became more alert and awake.  He was more sleepy than usual so he was brought to the ER.  Patient does have chronic right shoulder pain and left knee pain but denied any other complaints.  Patient is poor historian.  Patient does not remember or recall why they sent him to the ER.   ED Course: Blood pressure stable.  Bradycardic that is chronic issue.Hemoglobin 9.5 and stable.  Electrolytes are stable.  EKG shows chronic A-fib.  CT head with no acute findings.  Chest x-ray with no acute findings.  Urine was essentially normal.  Patient receiving 1 L of normal saline in the ER.  Due to significant symptoms on presentation admission requested. Thought to be UTI so he was given a dose of Rocephin in the ER.  Subjective: Patient seen and examined.  He was attempting to work with occupational therapy.  He could not take steps.  Complains of spasm of muscles on his legs and the right shoulder.  SNF is recommended.  Coordinating with [facility] program.  Assessment & Plan:   Hypotension: Primary cause unknown.  Suspect dehydration.  Suspect orthostatic drop in blood pressure.  Patient received total 1.5 L of fluid.  He does not have any evidence of orthostatic symptoms today.  Blood pressures are adequate.  Holding other antihypertensives but resuming metoprolol.  Essential hypertension: Patient is on nitrates, metoprolol and losartan.  Probably not tolerating all medications.  Will continue metoprolol but hold on nitrates and losartan.   Chronic pain:  Patient is on baclofen and Tylenol.  Continue.   Chronic A-fib: Rate controlled.  On metoprolol that is continued.  Therapeutic on Xarelto.  No evidence of GI bleeding.  Continue Xarelto.  He is on PPI for protection.   Suspected UTI: Less likely.  Received 1 dose of Rocephin. blood cultures negative.  Urine culture reported less than 10,000 colonies of organisms.  No indication to treat.  No urinary symptoms.  DVT prophylaxis: rivaroxaban (XARELTO) tablet 20 mg Start: 06/12/23 1000 rivaroxaban (XARELTO) tablet 20 mg   Code Status: Full code Family Communication: None Disposition Plan: Status is: Observation The patient remains OBS appropriate and will d/c before 2 midnights.  Medically stable for SNF level of care.     Consultants:  None  Procedures:  None  Antimicrobials:  Rocephin 1 g IV once 12/9     Objective: Vitals:   06/11/23 2009 06/12/23 0009 06/12/23 0424 06/12/23 0814  BP: 130/61 135/60 (!) 100/57 111/68  Pulse: (!) 56 (!) 57 70 68  Resp: 18 18 18 18   Temp: 97.8 F (36.6 C) 98.5 F (36.9 C) 98.8 F (37.1 C) 98.5 F (36.9 C)  TempSrc: Oral Oral Oral Oral  SpO2: 99% 96% 93% 99%  Weight: 113.1 kg     Height: 5\' 11"  (1.803 m)       Intake/Output Summary (Last 24 hours) at 06/12/2023 1420 Last data filed at 06/12/2023 0424 Gross per 24 hour  Intake 0 ml  Output 1750 ml  Net -1750 ml   [employer]  06/11/23 2009  Weight: 113.1 kg    Examination:  General exam: Appears calm and comfortable  In moderate distress on attempt to walk with the leg pain. Anxious.  Flat affect. Respiratory system: No added sounds. Cardiovascular system: S1 & S2 heard, irregularly irregular.  No edema.   Gastrointestinal system: Soft.  Nontender.  Bowel sound present. Central nervous system: Alert and oriented. No focal neurological deficits. Gross generalized weakness.  Does not have any palpable tenderness on his right shoulder or left leg or knee.    Data  Reviewed: I have personally reviewed following labs and imaging studies  CBC: Recent Labs  Lab 06/11/23 1216 06/11/23 1357 06/12/23 0443  WBC 4.1  --  3.7*  NEUTROABS 2.5  --   --   HGB 8.6* 9.5* 8.4*  HCT 28.2* 28.0* 26.5*  MCV 93.1  --  91.7  PLT 237  --  199   Basic Metabolic Panel: Recent Labs  Lab 06/11/23 1216 06/11/23 1357 06/12/23 0443  NA 138 141 138  K 4.1 4.1 3.6  CL 104  --  108  CO2 23  --  22  GLUCOSE 85  --  109*  BUN 12  --  10  CREATININE 1.34*  --  1.04  CALCIUM 9.0  --  8.6*  MG 2.0  --  1.9  PHOS  --   --  4.2   GFR: Estimated Creatinine Clearance: 66.4 mL/min (by C-G formula based on SCr of 1.04 mg/dL). Liver Function Tests: No results for input(s): "AST", "ALT", "ALKPHOS", "BILITOT", "PROT", "ALBUMIN" in the last 168 hours. No results for input(s): "LIPASE", "AMYLASE" in the last 168 hours. No results for input(s): "AMMONIA" in the last 168 hours. Coagulation Profile: No results for input(s): "INR", "PROTIME" in the last 168 hours. Cardiac Enzymes: No results for input(s): "CKTOTAL", "CKMB", "CKMBINDEX", "TROPONINI" in the last 168 hours. BNP (last 3 results) No results for input(s): "PROBNP" in the last 8760 hours. HbA1C: No results for input(s): "HGBA1C" in the last 72 hours. CBG: Recent Labs  Lab 06/11/23 1315  GLUCAP 88   Lipid Profile: No results for input(s): "CHOL", "HDL", "LDLCALC", "TRIG", "CHOLHDL", "LDLDIRECT" in the last 72 hours. Thyroid Function Tests: Recent Labs    06/11/23 1216 06/11/23 1317  TSH  --  1.697  FREET4 0.94  --    Anemia Panel: No results for input(s): "VITAMINB12", "FOLATE", "FERRITIN", "TIBC", "IRON", "RETICCTPCT" in the last 72 hours. Sepsis Labs: Recent Labs  Lab 06/11/23 1357  LATICACIDVEN 1.9    Recent Results (from the past 240 hour(s))  Urine Culture     Status: Abnormal   Collection Time: 06/11/23  2:36 PM   Specimen: Urine, Clean Catch  Result Value Ref Range Status   Specimen  Description URINE, CLEAN CATCH  Final   Special Requests NONE Reflexed from [id number]  Final   Culture (A)  Final    <10,000 COLONIES/mL INSIGNIFICANT GROWTH Performed at [facility], [address], [place], [place] [address]    Report Status 06/12/2023 FINAL  Final  Blood culture (routine x 2)     Status: None (Preliminary result)   Collection Time: 06/11/23  6:53 PM   Specimen: BLOOD RIGHT ARM  Result Value Ref Range Status   Specimen Description BLOOD RIGHT ARM  Final   Special Requests   Final    BOTTLES DRAWN AEROBIC AND ANAEROBIC Blood Culture adequate volume   Culture   Final    NO GROWTH < 24  HOURS Performed at [facility], [address], [place], [place] [address]    Report Status PENDING  Incomplete         Radiology Studies: CT Head Wo Contrast  Result Date: 06/11/2023 CLINICAL DATA:  Headache, new onset (Age >= 51y) EXAM: CT HEAD WITHOUT CONTRAST TECHNIQUE: Contiguous axial images were obtained from the base of the skull through the vertex without intravenous contrast. RADIATION DOSE REDUCTION: This exam was performed according to the departmental dose-optimization program which includes automated exposure control, adjustment of the mA and/or kV according to patient size and/or use of iterative reconstruction technique. COMPARISON:  CT head December 23, 2022. FINDINGS: Brain: No evidence of acute infarction, hemorrhage, hydrocephalus, extra-axial collection or mass lesion/mass effect. Similar patchy white matter hypodensities, compatible with chronic microvascular ischemic disease. Similar cerebral atrophy. Small remote right perirolandic infarct. Vascular: No hyperdense vessel identified Skull: No acute fracture. Sinuses/Orbits: Mostly clear sinuses. Other: Widespread retained metallic foreign body scattered in the scalp and also in the medial canthus of the left orbit. IMPRESSION: No evidence of acute intracranial abnormality. Electronically Signed   By: Feliberto Harts M.D.   On: 06/11/2023 15:46   DG Chest 2 View  Result Date: 06/11/2023 CLINICAL DATA:  Near syncope. EXAM: CHEST - 2 VIEW COMPARISON:  May 20, 2023. FINDINGS: The heart size and mediastinal contours are within normal limits. Both lungs are clear. The visualized skeletal structures are unremarkable. IMPRESSION: No active cardiopulmonary disease. Electronically Signed   By: Lupita Raider M.D.   On: 06/11/2023 14:35        Scheduled Meds:  atorvastatin  40 mg Oral QHS   baclofen  10 mg Oral TID   DULoxetine  30 mg Oral BID   gabapentin  300 mg Oral TID   metoprolol succinate  50 mg Oral Daily   pantoprazole  40 mg Oral BID AC   rivaroxaban  20 mg Oral q morning   traZODone  50 mg Oral QHS   Continuous Infusions:   LOS: 0 days    Time spent: 35 minutes    Dorcas Carrow, MD [facility]

## 2023-06-12 NOTE — TOC Initial Note (Addendum)
Transition of Care [facility]) - Initial/Assessment Note    Patient Details  Name: Tristan Gardner MRN: [id number] Date of Birth: 1934-07-13  [facility]) CM/SW Contact:    Marliss Coots, LCSW Phone Number: 06/12/2023, 2:11 PM  Clinical Narrative:                  2:11 PM CSW introduced herself and role to patient at bedside. CSW informed patient of therapy recommendation of SNF. Patient expressed understanding of this and accepted recommendation. CSW offered to coordinate SNF with [facility] (contracted with [address], [place], [place], [place] and will contact others if all four decline). Patient accepted CSW offer. CSW offered to call patients family/friends to notify them of admission. Patient informed CSW that he informed his granddaugher, Tristan Gardner, of admission already. Patient consented this CSW to contact [facility] to coordinate SNF. CSW called patient's [facility] assigned social worker, who informed their medical team of SNF recommendation and will notify CSW of [facility] decision of patient receiving at home [facility] services or or patient discharging to SNF.  2:50 PM [facility] social worker Monia Pouch [phone]) informed CSW that entire medical team has yet to make a decision but currently has received one decision (acceptance of SNF). Social worker suggested CSW to submit fl2 to contracted SNFs. CSW accepted this offer.  3:35 PM CSW returned to patient's bedside to inform him of discharge plan to SNF (provided four SNF options that are in contract with [facility]) instead of attending [facility] outpatient. Patient expressed understanding of this and requested CSW to inform patient's granddaughter, Tristan Gardner, of the information, as well. CSW accepted this request and called Tristan Gardner to inform her of discharge plan (SNF) and SNF options that are in contract with [facility]. Tristan Gardner declined interest in [place] and [place]. CSW will submit fl2 to [employer] and [facility].  Expected Discharge Plan: Skilled Nursing  Facility Barriers to Discharge: Continued Medical Work up, English as a second language teacher   Patient Goals and CMS Choice Patient states their goals for this hospitalization and ongoing recovery are:: SNF          Expected Discharge Plan and Services In-house Referral: Clinical Social Work   Post Acute Care Choice: Skilled Nursing Facility Living arrangements for the past 2 months: Single Family Home                                      Prior Living Arrangements/Services Living arrangements for the past 2 months: Single Family Home   Patient language and need for interpreter reviewed:: Yes        Need for Family Participation in Patient Care: Yes (Comment) Care giver support system in place?: Yes (comment)   Criminal Activity/Legal Involvement Pertinent to Current Situation/Hospitalization: No - Comment as needed  Activities of Daily Living   ADL Screening (condition at time of admission) Independently performs ADLs?: No Does the patient have a NEW difficulty with bathing/dressing/toileting/self-feeding that is expected to last >3 days?: No Does the patient have a NEW difficulty with getting in/out of bed, walking, or climbing stairs that is expected to last >3 days?: No Does the patient have a NEW difficulty with communication that is expected to last >3 days?: No Is the patient deaf or have difficulty hearing?: No Does the patient have difficulty seeing, even when wearing glasses/contacts?: No Does the patient have difficulty concentrating, remembering, or making decisions?: No  Permission Sought/Granted   Permission  granted to share information with : Yes, Verbal Permission Granted  Share Information with NAME: Tristan Gardner     Permission granted to share info w Relationship: Granddaughter  Permission granted to share info w Contact Information: [phone]  Emotional Assessment Appearance:: Appears stated age Attitude/Demeanor/Rapport: Engaged Affect  (typically observed): Accepting, Adaptable, Appropriate, Calm, Stable Orientation: : Oriented to Self, Oriented to Place, Oriented to  Time, Oriented to Situation Alcohol / Substance Use: Not Applicable Psych Involvement: No (comment)  Admission diagnosis:  Acute cystitis without hematuria [N30.00] Hypotension [I95.9] Patient Active Problem List   Diagnosis Date Noted   Hypotension 06/11/2023   Chest pain 05/20/2023   Elevated troponin 05/20/2023   Essential hypertension 05/20/2023   Aortic root dilatation (HCC) 05/01/2023   Low serum vitamin B12 03/20/2023   Folate deficiency 03/20/2023   GI bleed 02/26/2023   Atrial fibrillation, chronic (HCC) 02/26/2023   Pain, dental 12/29/2022   Hepatic lesion 12/29/2022   Dyslipidemia 12/29/2022   Prediabetes 12/29/2022   Class 1 obesity due to excess calories with body mass index (BMI) of 34.0 to 34.9 in adult 12/29/2022   Lumbar spinal stenosis 12/28/2022   Near syncope 12/24/2022   Neurological deficit, transient 12/24/2022   H/O: CVA (cerebrovascular accident) 12/24/2022   Peripheral neuropathy 12/24/2022   Acute gastric ulcer with hemorrhage 10/23/2022   Gastrointestinal hemorrhage 10/23/2022   Iron deficiency anemia 10/19/2022   Heme positive stool 10/19/2022   Chest pain, rule out acute myocardial infarction 08/06/2020   Elevated blood pressure reading 08/06/2020   H/O iron deficiency anemia 08/06/2020   Atrial flutter (HCC) 05/18/2020   Atypical chest pain 05/17/2020   Chronic atrial fibrillation with RVR (HCC) 03/11/2020   PCP:  [name], MD Pharmacy:   [id number] [address] ([place]), [place] - [address] [place] ([place]) [place] [address] Phone: [phone] Fax: [phone]     Social Determinants of Health (SDOH) Social History: SDOH Screenings   Food Insecurity: No Food Insecurity (06/11/2023)  Housing: Low Risk  (06/11/2023)  Transportation Needs: No Transportation Needs  (06/11/2023)  Utilities: Not At Risk (06/11/2023)  Financial Resource Strain: Low Risk  (02/13/2023)   Received from [facility]  Physical Activity: Insufficiently Active (02/24/2020)   Received from [facility], [facility]  Social Connections: Unknown (11/03/2021)   Received from [facility], [facility]  Stress: Stress Concern Present (02/24/2020)   Received from [facility], [facility]  Tobacco Use: Medium Risk (06/11/2023)   SDOH Interventions:     Readmission Risk Interventions    02/28/2023   11:02 AM  Readmission Risk Prevention Plan  Transportation Screening Complete  PCP or Specialist Appt within 3-5 Days Complete  HRI or Home Care Consult Complete  Social Work Consult for Recovery Care Planning/Counseling Complete  Palliative Care Screening Not Applicable  Medication Review Oceanographer) Complete

## 2023-06-12 NOTE — ED Provider Notes (Signed)
  Physical Exam  BP 135/60 (BP Location: Left Arm)   Pulse (!) 57   Temp 98.5 F (36.9 C) (Oral)   Resp 18   Ht 5\' 11"  (1.803 m)   Wt 113.1 kg   SpO2 96%   BMI 34.78 kg/m     Procedures  Procedures  ED Course / MDM   Clinical Course as of 06/12/23 0038  Mon Jun 11, 2023  1544 Complicated UTI [CC]  1603 Admit for complicated UTI Arrived for hypotension, AMS  Was 60/40 w/ EMS, soft BP on arrival [JR]    Clinical Course User Index [CC] Glyn Ade, MD [JR] Rolla Flatten, MD   Medical Decision Making Amount and/or Complexity of Data Reviewed Labs: ordered. Radiology: ordered.  Risk Decision regarding hospitalization.   Patient was primarily seen by Dr. Jean Rosenthal prior to my shift.  I assumed care of this patient at the start of my shift.  Plan at the time of handoff is for hospital admission for complicated UTI.  Patient had an episode of hypotension and altered mental status today.  Was hypotensive in the field but received fluids with EMS.  Hemodynamically stable here.  UA concerning for UTI.  Has received empiric ceftriaxone.  I spoke with the hospitalist, Dr. Jerral Ralph, who has accepted the patient for admission.       Rolla Flatten, MD 06/12/23 Lenice Pressman    Glyn Ade, MD 06/12/23 1907

## 2023-06-12 NOTE — Evaluation (Addendum)
Occupational Therapy Evaluation Patient Details Name: Tristan Gardner MRN: [id number] DOB: Feb 21, 1935 Today's Date: 06/12/2023   History of Present Illness Tristan Gardner is a 88 y.o. male  who was at daycare center [facility]) and was found to be lethargic, confused.  Blood pressure was 60/40, improved with IV fluids; with medical history significant of chronic A-fib on Xarelto, coronary artery disease, history of stroke, hypertension, hyperlipidemia, history of GI bleeding but currently stable, decompressive laminectomies June 2024   Clinical Impression   Pt reports use of rollator at baseline for mobility, ind with ADLs with the exception of bathing, has an aide that comes daily 6pm-8pm and attends [facility] program during the day. Pt lives with granddaughter who assists on wknds. Pt currently with LLE and R shoulder pain, needing overall set up - mod A for ADLs, min A for bed mobility and min-mod A for transfers with RW. Pt presenting with impairments listed below, will follow acutely. Patient will benefit from continued inpatient follow up therapy, <3 hours/day to maximize safety/ind with ADL/functional mobility.       If plan is discharge home, recommend the following: A lot of help with walking and/or transfers;A lot of help with bathing/dressing/bathroom;Assist for transportation;Direct supervision/assist for medications management;Direct supervision/assist for financial management;Assistance with cooking/housework    Functional Status Assessment  Patient has had a recent decline in their functional status and demonstrates the ability to make significant improvements in function in a reasonable and predictable amount of time.  Equipment Recommendations  BSC/3in1    Recommendations for Other Services PT consult     Precautions / Restrictions Precautions Precautions: Fall Restrictions Weight Bearing Restrictions: No      Mobility Bed Mobility Overal bed mobility: Needs  Assistance Bed Mobility: Supine to Sit     Supine to sit: Min assist     General bed mobility comments: incr time and assist to scoot EOB    Transfers Overall transfer level: Needs assistance Equipment used: Rolling walker (2 wheels) Transfers: Sit to/from Stand Sit to Stand: Min assist, Mod assist           General transfer comment: cues to push from bed      Balance Overall balance assessment: Needs assistance Sitting-balance support: No upper extremity supported, Feet supported Sitting balance-Leahy Scale: Fair     Standing balance support: Bilateral upper extremity supported, Single extremity supported, Reliant on assistive device for balance Standing balance-Leahy Scale: Poor Standing balance comment: reliant on UE support                           ADL either performed or assessed with clinical judgement   ADL Overall ADL's : Needs assistance/impaired Eating/Feeding: Set up   Grooming: Set up   Upper Body Bathing: Moderate assistance   Lower Body Bathing: Maximal assistance   Upper Body Dressing : Moderate assistance   Lower Body Dressing: Maximal assistance   Toilet Transfer: Minimal assistance;Moderate assistance;Rolling walker (2 wheels)   Toileting- Clothing Manipulation and Hygiene: Minimal assistance               Vision   Vision Assessment?: No apparent visual deficits     Perception Perception: Not tested       Praxis Praxis: Not tested       Pertinent Vitals/Pain Pain Assessment Pain Assessment: Faces Pain Score: 3  Faces Pain Scale: Hurts little more Pain Location: L Leg and R shoulder Pain Descriptors / Indicators: Discomfort  Pain Intervention(s): Limited activity within patient's tolerance, Monitored during session, Repositioned     Extremity/Trunk Assessment Upper Extremity Assessment Upper Extremity Assessment: Defer to OT evaluation   Lower Extremity Assessment Lower Extremity Assessment: Generalized  weakness   Cervical / Trunk Assessment Cervical / Trunk Assessment: Other exceptions Cervical / Trunk Exceptions: Noting difficulty getting to fully upright standing; tends to keep hips significantly flexed in standing and throughout gait cycle   Communication Communication Communication: Hearing impairment Cueing Techniques: Verbal cues   Cognition Arousal: Alert Behavior During Therapy: WFL for tasks assessed/performed Overall Cognitive Status: No family/caregiver present to determine baseline cognitive functioning                                       General Comments  VSS    Exercises     Shoulder Instructions      Home Living Family/patient expects to be discharged to:: Private residence Living Arrangements: Other relatives (granddaughter) Available Help at Discharge: Family;Available PRN/intermittently Type of Home: House Home Access: Level entry     Home Layout: One level     Bathroom Shower/Tub: Chief Strategy Officer: Handicapped height     Home Equipment: Rollator (4 wheels);Shower seat   Additional Comments: aide M-F 3 hours 6pm-8pm      Prior Functioning/Environment Prior Level of Function : Needs assist             Mobility Comments: Modified independent with rollator ADLs Comments: Aide helps with bathing, meals        OT Problem List: Decreased strength;Decreased range of motion;Decreased activity tolerance;Impaired balance (sitting and/or standing);Decreased cognition;Decreased safety awareness      OT Treatment/Interventions: Self-care/ADL training;Therapeutic exercise;Energy conservation;DME and/or AE instruction;Therapeutic activities;Patient/family education;Balance training    OT Goals(Current goals can be found in the care plan section) Acute Rehab OT Goals Patient Stated Goal: to decr pain OT Goal Formulation: With patient Time For Goal Achievement: 06/26/23 Potential to Achieve Goals: Good ADL  Goals Pt Will Perform Upper Body Dressing: with modified independence;sitting Pt Will Perform Lower Body Dressing: with min assist;sitting/lateral leans;sit to/from stand Pt Will Transfer to Toilet: with modified independence;ambulating;regular height toilet Pt Will Perform Tub/Shower Transfer: Shower transfer;with modified independence;ambulating;shower seat  OT Frequency: Min 1X/week    Co-evaluation              AM-PAC OT "6 Clicks" Daily Activity     Outcome Measure Help from another person eating meals?: A Little Help from another person taking care of personal grooming?: A Little Help from another person toileting, which includes using toliet, bedpan, or urinal?: A Little Help from another person bathing (including washing, rinsing, drying)?: A Lot Help from another person to put on and taking off regular upper body clothing?: A Little Help from another person to put on and taking off regular lower body clothing?: A Lot 6 Click Score: 16   End of Session Equipment Utilized During Treatment: Gait belt;Rolling walker (2 wheels) Nurse Communication: Mobility status  Activity Tolerance: Patient tolerated treatment well Patient left: with call bell/phone within reach;in chair (handoff to PT in room)  OT Visit Diagnosis: Unsteadiness on feet (R26.81);Other abnormalities of gait and mobility (R26.89);Muscle weakness (generalized) (M62.81)                Time: 9147-8295 OT Time Calculation (min): 30 min Charges:  OT General Charges $OT Visit: 1 Visit OT Evaluation $  OT Eval Moderate Complexity: 1 Mod OT Treatments $Self Care/Home Management : 8-22 mins  Carver Fila, OTD, OTR/L SecureChat Preferred Acute Rehab [phone]   Dalphine Handing 06/12/2023, 10:51 AM

## 2023-06-12 NOTE — [place] FL2 (Signed)
[place] MEDICAID FL2 LEVEL OF CARE FORM     IDENTIFICATION  Patient Name: Tristan Gardner Birthdate: 08-05-1934 Sex: male Admission Date (Current Location): 06/11/2023  [facility] and [place] Number:  Producer, television/film/video and Address:  [facility] [place], [address], [place], [place] [address]      Provider Number: [id number]  Attending Physician Name and Address:  Dorcas Carrow, MD  Relative Name and Phone Number:  Julien Nordmann, [phone]    Current Level of Care: Hospital Recommended Level of Care: Skilled Nursing Facility Prior Approval Number:    Date Approved/Denied:   PASRR Number: [id number]  Discharge Plan: SNF    Current Diagnoses: Patient Active Problem List   Diagnosis Date Noted   Hypotension 06/11/2023   Chest pain 05/20/2023   Elevated troponin 05/20/2023   Essential hypertension 05/20/2023   Aortic root dilatation (HCC) 05/01/2023   Low serum vitamin B12 03/20/2023   Folate deficiency 03/20/2023   GI bleed 02/26/2023   Atrial fibrillation, chronic (HCC) 02/26/2023   Pain, dental 12/29/2022   Hepatic lesion 12/29/2022   Dyslipidemia 12/29/2022   Prediabetes 12/29/2022   Class 1 obesity due to excess calories with body mass index (BMI) of 34.0 to 34.9 in adult 12/29/2022   Lumbar spinal stenosis 12/28/2022   Near syncope 12/24/2022   Neurological deficit, transient 12/24/2022   H/O: CVA (cerebrovascular accident) 12/24/2022   Peripheral neuropathy 12/24/2022   Acute gastric ulcer with hemorrhage 10/23/2022   Gastrointestinal hemorrhage 10/23/2022   Iron deficiency anemia 10/19/2022   Heme positive stool 10/19/2022   Chest pain, rule out acute myocardial infarction 08/06/2020   Elevated blood pressure reading 08/06/2020   H/O iron deficiency anemia 08/06/2020   Atrial flutter (HCC) 05/18/2020   Atypical chest pain 05/17/2020   Chronic atrial fibrillation with RVR (HCC) 03/11/2020    Orientation  RESPIRATION BLADDER Height & Weight     Self, Time, Situation, Place  Normal (Room Air) Continent Weight: 249 lb 5.4 oz (113.1 kg) Height:  5\' 11"  (180.3 cm)  BEHAVIORAL SYMPTOMS/MOOD NEUROLOGICAL BOWEL NUTRITION STATUS      Continent Diet (please see dc summary)  AMBULATORY STATUS COMMUNICATION OF NEEDS Skin   Extensive Assist Verbally Normal                       Personal Care Assistance Level of Assistance  Bathing, Feeding, Dressing Bathing Assistance: Maximum assistance Feeding assistance: Maximum assistance Dressing Assistance: Maximum assistance     Functional Limitations Info  Sight Sight Info: Impaired (Eyeglasses)        SPECIAL CARE FACTORS FREQUENCY  PT (By licensed PT), OT (By licensed OT)     PT Frequency: 5x OT Frequency: 5x            Contractures Contractures Info: Not present    Additional Factors Info  Code Status, Allergies Code Status Info: Full Code Allergies Info: Iodine and Shellfish           Current Medications (06/12/2023):  This is the current hospital active medication list Current Facility-Administered Medications  Medication Dose Route Frequency Provider Last Rate Last Admin   acetaminophen (TYLENOL) tablet 650 mg  650 mg Oral Q6H PRN Dorcas Carrow, MD       albuterol (PROVENTIL) (2.5 MG/3ML) 0.083% nebulizer solution 3 mL  3 mL Inhalation Q4H PRN Dorcas Carrow, MD       atorvastatin (LIPITOR) tablet 40 mg  40 mg Oral QHS Dorcas Carrow, MD  40 mg at 06/11/23 2116   baclofen (LIORESAL) tablet 10 mg  10 mg Oral TID Dorcas Carrow, MD   10 mg at 06/12/23 7829   DULoxetine (CYMBALTA) DR capsule 30 mg  30 mg Oral BID Dorcas Carrow, MD   30 mg at 06/12/23 5621   gabapentin (NEURONTIN) capsule 300 mg  300 mg Oral TID Dorcas Carrow, MD   300 mg at 06/12/23 3086   metoprolol succinate (TOPROL-XL) 24 hr tablet 50 mg  50 mg Oral Daily Dorcas Carrow, MD   50 mg at 06/12/23 5784   Oral care mouth rinse  15 mL Mouth Rinse PRN  Dorcas Carrow, MD       pantoprazole (PROTONIX) EC tablet 40 mg  40 mg Oral BID AC Dorcas Carrow, MD   40 mg at 06/12/23 6962   rivaroxaban (XARELTO) tablet 20 mg  20 mg Oral q morning Dorcas Carrow, MD   20 mg at 06/12/23 9528   traZODone (DESYREL) tablet 50 mg  50 mg Oral QHS Dorcas Carrow, MD   50 mg at 06/11/23 2116     Discharge Medications: Please see discharge summary for a list of discharge medications.  Relevant Imaging Results:  Relevant Lab Results:   Additional Information SSN: [id number]  Rithvik Orcutt E Lawren Sexson, LCSW

## 2023-06-13 DIAGNOSIS — E861 Hypovolemia: Secondary | ICD-10-CM | POA: Diagnosis not present

## 2023-06-13 MED ORDER — DICLOFENAC SODIUM 1 % EX GEL
4.0000 g | Freq: Four times a day (QID) | CUTANEOUS | Status: DC
  Administered 2023-06-13 – 2023-06-14 (×4): 4 g via TOPICAL
  Filled 2023-06-13: qty 100

## 2023-06-13 NOTE — TOC Progression Note (Addendum)
Transition of Care [facility]) - Progression Note    Patient Details  Name: Tristan Gardner MRN: [id number] Date of Birth: [birth year]  [facility]) CM/SW Contact  Marliss Coots, LCSW Phone Number: 06/13/2023, 12:27 PM  Clinical Narrative:     12:27 PM CSW attempted to call Bre (social worker at [facility]) but there was no response and a voicemail was left. CSW called patient's granddaughter, [name], to inform her that [facility] was not able to offer a bed, but that [facility] is able to offer bed. [name] accepted [place] bed offer. CSW offered to follow up with patient on decision (consent to discharge to [facility]) and provide Medicare ratings (paper to patient, email to granddaughter). [name] accepted this offer and provided CSW with her email [name]). [name] stated that she would also attempt to contact Bre to notify her of CSW calls.   1:03 PM Bre returned this CSW's calls. CSW informed Bre of [facility]'s decision of [facility]. Bre stated that she contacted admissions with [facility] to accept bed offer and will coordinate [facility] transportation for discharge. Bre informed CSW that [facility] will not have a bed until tomorrow. CSW relayed following information to patient, who expressed understanding of this and accepted granddaughter's decision of [facility]. CSW informed patient that [name] declined CSW offer to send referrals to [place] and [place], as well as of [facility]' decline, which patient expressed understanding of and accepted.   Expected Discharge Plan: Skilled Nursing Facility Barriers to Discharge: Continued Medical Work up, English as a second language teacher  Expected Discharge Plan and Services In-house Referral: Clinical Social Work   Post Acute Care Choice: Skilled Nursing Facility Living arrangements for the past 2 months: Single Family Home                                       Social Determinants of Health (SDOH) Interventions SDOH  Screenings   Food Insecurity: No Food Insecurity (06/11/2023)  Housing: Low Risk  (06/11/2023)  Transportation Needs: No Transportation Needs (06/11/2023)  Utilities: Not At Risk (06/11/2023)  Financial Resource Strain: Low Risk  (02/13/2023)   Received from [facility]  Physical Activity: Insufficiently Active (02/24/2020)   Received from [facility], [facility]  Social Connections: Unknown (11/03/2021)   Received from [facility], [facility]  Stress: Stress Concern Present (02/24/2020)   Received from [facility], [facility]  Tobacco Use: Medium Risk (06/11/2023)    Readmission Risk Interventions    02/28/2023   11:02 AM  Readmission Risk Prevention Plan  Transportation Screening Complete  PCP or Specialist Appt within 3-5 Days Complete  HRI or Home Care Consult Complete  Social Work Consult for Recovery Care Planning/Counseling Complete  Palliative Care Screening Not Applicable  Medication Review Oceanographer) Complete

## 2023-06-13 NOTE — Progress Notes (Signed)
PROGRESS NOTE    Tristan Gardner  ZOX:[id number] DOB: 1935-06-06 DOA: 06/11/2023 PCP: [name], MD    Brief Narrative:  88 y.o. male with medical history significant of chronic A-fib on Xarelto, coronary artery disease, history of stroke, hypertension, hyperlipidemia, history of GI bleeding but currently stable who was at daycare center and was found to be lethargic, confused.  Blood pressure was 60/40.  Blood pressure improved with IV fluids and patient became more alert and awake.  He was more sleepy than usual so he was brought to the ER.  Patient does have chronic right shoulder pain and left knee pain but denied any other complaints.  Patient is poor historian.  Patient does not remember or recall why they sent him to the ER.   ED Course: Blood pressure stable.  Bradycardic that is chronic issue.Hemoglobin 9.5 and stable.  Electrolytes are stable.  EKG shows chronic A-fib.  CT head with no acute findings.  Chest x-ray with no acute findings.  Urine was essentially normal.  Patient receiving 1 L of normal saline in the ER.  Due to significant symptoms on presentation admission requested. Thought to be UTI so he was given a dose of Rocephin in the ER.  Subjective:  Patient was seen and examined.  No overnight events.  Continues to have spasms and pain on his legs and right shoulder on attempt to move.  Blood pressure remains stable. On attempted mobility, he did drop systolic blood pressure by 26 points and complained of being dizzy but also complained of pain.  This is probably due to pain and muscle spasm.  Will try Voltaren gel.  Assessment & Plan:   Hypotension: Primary cause unknown.  Suspect dehydration.  Suspect orthostatic drop in blood pressure.  Patient received total 1.5 L of fluid.  Blood pressures are adequate.  Holding other antihypertensives but resuming metoprolol. He does have a drop in orthostatic blood pressures however standing blood pressure is 110.  Essential  hypertension: Patient is on nitrates, metoprolol and losartan.  Probably not tolerating all medications.  Will continue metoprolol but hold on nitrates and losartan.   Chronic pain: Patient is on baclofen and Tylenol.  Continue.  Will add Voltaren gel.   Chronic A-fib: Rate controlled.  On metoprolol that is continued.  Therapeutic on Xarelto.  No evidence of GI bleeding.  Continue Xarelto.  He is on PPI for protection.   Suspected UTI: Less likely.  Received 1 dose of Rocephin. blood cultures negative.  Urine culture reported less than 10,000 colonies of organisms.  No indication to treat.  No urinary symptoms.  DVT prophylaxis: rivaroxaban (XARELTO) tablet 20 mg Start: 06/12/23 1000 rivaroxaban (XARELTO) tablet 20 mg   Code Status: Full code Family Communication: Granddaughter on the phone. Disposition Plan: Status is: Observation The patient remains OBS appropriate and will d/c before 2 midnights.  Medically stable for SNF level of care.     Consultants:  None  Procedures:  None  Antimicrobials:  Rocephin 1 g IV once 12/9     Objective: Vitals:   06/12/23 1644 06/12/23 2132 06/13/23 0339 06/13/23 0814  BP: 123/66 (!) 123/59 108/60 (!) 125/50  Pulse: 78 60 60 (!) 50  Resp: 19 17 17 18   Temp: 98 F (36.7 C)  98.7 F (37.1 C) 98 F (36.7 C)  TempSrc:   Axillary   SpO2: 100% 99% 97% 99%  Weight:      Height:        Intake/Output Summary (Last  24 hours) at 06/13/2023 1258 Last data filed at 06/13/2023 0340 Gross per 24 hour  Intake --  Output 1050 ml  Net -1050 ml   Filed Weights   06/11/23 2009  Weight: 113.1 kg    Examination:  General exam: Looks fairly comfortable at rest.  Anxious on mobility. Respiratory system: No added sounds. Cardiovascular system: S1 & S2 heard, irregularly irregular.  No edema.   Gastrointestinal system: Soft.  Nontender.  Bowel sound present. Central nervous system: Alert and oriented. No focal neurological deficits. Gross  generalized weakness.  Does not have any palpable tenderness on his right shoulder or left leg or knee.    Data Reviewed: I have personally reviewed following labs and imaging studies  CBC: Recent Labs  Lab 06/11/23 1216 06/11/23 1357 06/12/23 0443  WBC 4.1  --  3.7*  NEUTROABS 2.5  --   --   HGB 8.6* 9.5* 8.4*  HCT 28.2* 28.0* 26.5*  MCV 93.1  --  91.7  PLT 237  --  199   Basic Metabolic Panel: Recent Labs  Lab 06/11/23 1216 06/11/23 1357 06/12/23 0443  NA 138 141 138  K 4.1 4.1 3.6  CL 104  --  108  CO2 23  --  22  GLUCOSE 85  --  109*  BUN 12  --  10  CREATININE 1.34*  --  1.04  CALCIUM 9.0  --  8.6*  MG 2.0  --  1.9  PHOS  --   --  4.2   GFR: Estimated Creatinine Clearance: 66.4 mL/min (by C-G formula based on SCr of 1.04 mg/dL). Liver Function Tests: No results for input(s): "AST", "ALT", "ALKPHOS", "BILITOT", "PROT", "ALBUMIN" in the last 168 hours. No results for input(s): "LIPASE", "AMYLASE" in the last 168 hours. No results for input(s): "AMMONIA" in the last 168 hours. Coagulation Profile: No results for input(s): "INR", "PROTIME" in the last 168 hours. Cardiac Enzymes: No results for input(s): "CKTOTAL", "CKMB", "CKMBINDEX", "TROPONINI" in the last 168 hours. BNP (last 3 results) No results for input(s): "PROBNP" in the last 8760 hours. HbA1C: No results for input(s): "HGBA1C" in the last 72 hours. CBG: Recent Labs  Lab 06/11/23 1315  GLUCAP 88   Lipid Profile: No results for input(s): "CHOL", "HDL", "LDLCALC", "TRIG", "CHOLHDL", "LDLDIRECT" in the last 72 hours. Thyroid Function Tests: Recent Labs    06/11/23 1216 06/11/23 1317  TSH  --  1.697  FREET4 0.94  --    Anemia Panel: No results for input(s): "VITAMINB12", "FOLATE", "FERRITIN", "TIBC", "IRON", "RETICCTPCT" in the last 72 hours. Sepsis Labs: Recent Labs  Lab 06/11/23 1357  LATICACIDVEN 1.9    Recent Results (from the past 240 hour(s))  Urine Culture     Status: Abnormal    Collection Time: 06/11/23  2:36 PM   Specimen: Urine, Clean Catch  Result Value Ref Range Status   Specimen Description URINE, CLEAN CATCH  Final   Special Requests NONE Reflexed from [id number]  Final   Culture (A)  Final    <10,000 COLONIES/mL INSIGNIFICANT GROWTH Performed at [facility], [address] [place], [place] [address]    Report Status 06/12/2023 FINAL  Final  Blood culture (routine x 2)     Status: None (Preliminary result)   Collection Time: 06/11/23  6:53 PM   Specimen: BLOOD RIGHT ARM  Result Value Ref Range Status   Specimen Description BLOOD RIGHT ARM  Final   Special Requests   Final    BOTTLES  DRAWN AEROBIC AND ANAEROBIC Blood Culture adequate volume   Culture   Final    NO GROWTH 2 DAYS Performed at [facility], [address], [place], [place] [address]    Report Status PENDING  Incomplete         Radiology Studies: CT Head Wo Contrast  Result Date: 06/11/2023 CLINICAL DATA:  Headache, new onset (Age >= 51y) EXAM: CT HEAD WITHOUT CONTRAST TECHNIQUE: Contiguous axial images were obtained from the base of the skull through the vertex without intravenous contrast. RADIATION DOSE REDUCTION: This exam was performed according to the departmental dose-optimization program which includes automated exposure control, adjustment of the mA and/or kV according to patient size and/or use of iterative reconstruction technique. COMPARISON:  CT head December 23, 2022. FINDINGS: Brain: No evidence of acute infarction, hemorrhage, hydrocephalus, extra-axial collection or mass lesion/mass effect. Similar patchy white matter hypodensities, compatible with chronic microvascular ischemic disease. Similar cerebral atrophy. Small remote right perirolandic infarct. Vascular: No hyperdense vessel identified Skull: No acute fracture. Sinuses/Orbits: Mostly clear sinuses. Other: Widespread retained metallic foreign body scattered in the scalp and also in the medial canthus of the  left orbit. IMPRESSION: No evidence of acute intracranial abnormality. Electronically Signed   By: Feliberto Harts M.D.   On: 06/11/2023 15:46        Scheduled Meds:  atorvastatin  40 mg Oral QHS   baclofen  10 mg Oral TID   diclofenac Sodium  4 g Topical QID   DULoxetine  30 mg Oral BID   gabapentin  300 mg Oral TID   metoprolol succinate  50 mg Oral Daily   pantoprazole  40 mg Oral BID AC   rivaroxaban  20 mg Oral q morning   traZODone  50 mg Oral QHS   Continuous Infusions:   LOS: 0 days    Time spent: 35 minutes    Dorcas Carrow, MD [facility]

## 2023-06-13 NOTE — Progress Notes (Signed)
Physical Therapy Treatment Patient Details Name: Tristan Gardner MRN: [id number] DOB: 26-Apr-1935 Today's Date: 06/13/2023   History of Present Illness Tristan Gardner is a 88 y.o. male  who was at [facility] and was found to be lethargic, confused.  Blood pressure was 60/40, improved with IV fluids; with medical history significant of chronic A-fib on Xarelto, coronary artery disease, history of stroke, hypertension, hyperlipidemia, history of GI bleeding but currently stable, decompressive laminectomies June 2024    PT Comments  Continuing work on functional mobility and activity tolerance; session focused on obtaining orthostatic blood pressures within the context of getting up and out of bed; see other PT note of this date for details regarding blood pressures in supine, sitting, standing, and standing after 3 to 5 minutes of activity; Patient reports lightheadedness in standing, and that did coincide with a 26 mmHg drop in systolic blood pressure; back pain continues to limit his ability to get to fully upright standing; at this time he needs someone to follow with a chair for safety while he is ambulating; notified MD about above concerns; continue to recommend postacute rehab at skilled nursing level to maximize independence and safety with mobility and ADLs and allow for safe discharge home    If plan is discharge home, recommend the following: A lot of help with walking and/or transfers   Can travel by private vehicle     No  Equipment Recommendations  None recommended by PT (can defer to next venue)    Recommendations for Other Services       Precautions / Restrictions Precautions Precautions: Fall Restrictions Weight Bearing Restrictions: No     Mobility  Bed Mobility Overal bed mobility: Needs Assistance Bed Mobility: Supine to Sit     Supine to sit: Min assist     General bed mobility comments: incr time and assist to scoot EOB     Transfers Overall transfer level: Needs assistance Equipment used: Rolling walker (2 wheels) Transfers: Sit to/from Stand Sit to Stand: Min assist, Mod assist           General transfer comment: cues to push from armrests; Mod assist to power up; did not get to fully upright standing    Ambulation/Gait Ambulation/Gait assistance: Min assist, +2 safety/equipment   Assistive device: Rolling walker (2 wheels) Gait Pattern/deviations: Step-to pattern, Decreased step length - right, Shuffle, Trunk flexed (Hips flexed)       General Gait Details: Assist for safety. Cues to stand more upright and closer to walker; Bil hips flexed throughout gait cycle; difficulty with LLE stance and decr RLE step length; reports of dizziness during amb   Stairs             Wheelchair Mobility     Tilt Bed    Modified Rankin (Stroke Patients Only)       Balance Overall balance assessment: Needs assistance Sitting-balance support: No upper extremity supported, Feet supported Sitting balance-Leahy Scale: Fair     Standing balance support: Bilateral upper extremity supported, Single extremity supported, Reliant on assistive device for balance Standing balance-Leahy Scale: Poor Standing balance comment: reliant on UE support                            Cognition Arousal: Alert Behavior During Therapy: WFL for tasks assessed/performed Overall Cognitive Status: No family/caregiver present to determine baseline cognitive functioning  Exercises      General Comments        Pertinent Vitals/Pain Pain Assessment Pain Assessment: Faces Faces Pain Scale: Hurts little more Pain Location: L Leg and R shoulder; back pain Pain Descriptors / Indicators: Discomfort Pain Intervention(s): Monitored during session, Repositioned, Other (comment) (Notified MD)    Home Living                          Prior  Function            PT Goals (current goals can now be found in the care plan section) Acute Rehab PT Goals Patient Stated Goal: did not specifically state, agrees to getting up and wlaing PT Goal Formulation: With patient Time For Goal Achievement: 06/26/23 Potential to Achieve Goals: Good Progress towards PT goals: Progressing toward goals (slowly)    Frequency    Min 1X/week      PT Plan      Co-evaluation              AM-PAC PT "6 Clicks" Mobility   Outcome Measure  Help needed turning from your back to your side while in a flat bed without using bedrails?: A Little Help needed moving from lying on your back to sitting on the side of a flat bed without using bedrails?: A Little Help needed moving to and from a bed to a chair (including a wheelchair)?: A Lot Help needed standing up from a chair using your arms (e.g., wheelchair or bedside chair)?: A Lot Help needed to walk in hospital room?: A Lot Help needed climbing 3-5 steps with a railing? : A Lot 6 Click Score: 14    End of Session Equipment Utilized During Treatment: Gait belt Activity Tolerance: Patient tolerated treatment well Patient left: in chair;with call bell/phone within reach;with chair alarm set Nurse Communication: Mobility status PT Visit Diagnosis: Unsteadiness on feet (R26.81);Other abnormalities of gait and mobility (R26.89);Muscle weakness (generalized) (M62.81)     Time: 1027-1100 PT Time Calculation (min) (ACUTE ONLY): 33 min  Charges:    $Gait Training: 8-22 mins $Therapeutic Activity: 8-22 mins PT General Charges $$ ACUTE PT VISIT: 1 Visit                     Van Clines, PT  Acute Rehabilitation Services Office [phone] Secure Chat welcomed    Levi Aland 06/13/2023, 2:13 PM

## 2023-06-13 NOTE — Progress Notes (Signed)
Physical Therapy Note  (Full treatment note to follow)  Session focused on obtaining a set of orthostatic BPs, as follows:     06/13/23 1100 06/13/23 1105  Orthostatic Lying   BP- Lying 137/69  --   Pulse- Lying 58  --   Orthostatic Sitting  BP- Sitting 121/73 134/80  Pulse- Sitting 59 59  Orthostatic Standing at 0 minutes  BP- Standing at 0 minutes 128/66  --   Pulse- Standing at 0 minutes 59  --   Orthostatic Standing at 3 minutes  BP- Standing at 3 minutes 111/85  --   Pulse- Standing at 3 minutes 74  --    Noteworthy for SBP decr of 26 mmHg from supine to standing at 3 minutes, as well as incr in HR;  Reports dizziness/"wooziness" with standing activity;   Van Clines, PT  Acute Rehabilitation Services Office [phone] Secure Chat welcomed

## 2023-06-14 DIAGNOSIS — E861 Hypovolemia: Secondary | ICD-10-CM | POA: Diagnosis not present

## 2023-06-14 DIAGNOSIS — I482 Chronic atrial fibrillation, unspecified: Secondary | ICD-10-CM

## 2023-06-14 MED ORDER — SENNA 8.6 MG PO TABS: 1.0000 | ORAL_TABLET | Freq: Every day | ORAL | Status: DC | PRN

## 2023-06-14 MED ORDER — KETOROLAC TROMETHAMINE 15 MG/ML IJ SOLN
7.5000 mg | Freq: Once | INTRAMUSCULAR | Status: AC | PRN
  Administered 2023-06-14: 7.5 mg via INTRAVENOUS
  Filled 2023-06-14: qty 1

## 2023-06-14 MED ORDER — DICLOFENAC SODIUM 1 % EX GEL: 4.0000 g | Freq: Four times a day (QID) | CUTANEOUS | Status: AC

## 2023-06-14 NOTE — TOC Transition Note (Signed)
Transition of Care [facility]) - Discharge Note   Patient Details  Name: Tristan Gardner MRN: [id number] Date of Birth: 20-Jul-1934  Transition of Care [facility]) CM/SW Contact:  Marliss Coots, LCSW Phone Number: 06/14/2023, 12:50 PM   Clinical Narrative:     Patient will DC to: [facility] Anticipated DC date: 06/14/2023 Family notified: Alvino Chapel; Granddaughter; [phone]  Transport by: [facility]   Per MD patient ready for DC to [facility] RN to call report prior to discharge [phone]). RN, patient, patient's family, and facility notified of DC. Discharge Summary and FL2 sent to facility. DC packet on chart. Ambulance transport requested for patient.   CSW will sign off for now as social work intervention is no longer needed. Please consult [place] again if new needs arise.    Final next level of care: Skilled Nursing Facility Barriers to Discharge: Barriers Resolved   Patient Goals and CMS Choice Patient states their goals for this hospitalization and ongoing recovery are:: SNF CMS Medicare.gov Compare Post Acute Care list provided to:: Patient Choice offered to / list presented to : Patient      Discharge Placement              Patient chooses bed at: [facility] Patient to be transferred to facility by: [facility] Name of family member notified: Alvino Chapel; Granddaughter; [phone] Patient and family notified of of transfer: 06/14/23  Discharge Plan and Services Additional resources added to the After Visit Summary for   In-house Referral: Clinical Social Work   Post Acute Care Choice: Skilled Nursing Facility                               Social Drivers of Health (SDOH) Interventions SDOH Screenings   Food Insecurity: No Food Insecurity (06/11/2023)  Housing: Low Risk  (06/11/2023)  Transportation Needs: No Transportation Needs (06/11/2023)  Utilities: Not At Risk (06/11/2023)  Financial Resource Strain: Low Risk  (02/13/2023)    Received from [facility]  Physical Activity: Insufficiently Active (02/24/2020)   Received from [facility], [facility]  Social Connections: Unknown (11/03/2021)   Received from [facility], [facility]  Stress: Stress Concern Present (02/24/2020)   Received from [facility], [facility]  Tobacco Use: Medium Risk (06/11/2023)     Readmission Risk Interventions    02/28/2023   11:02 AM  Readmission Risk Prevention Plan  Transportation Screening Complete  PCP or Specialist Appt within 3-5 Days Complete  HRI or Home Care Consult Complete  Social Work Consult for Recovery Care Planning/Counseling Complete  Palliative Care Screening Not Applicable  Medication Review Oceanographer) Complete

## 2023-06-14 NOTE — TOC Progression Note (Addendum)
Transition of Care [facility]) - Progression Note    Patient Details  Name: Tristan Gardner MRN: [id number] Date of Birth: 05/06/1935  Transition of Care [facility]) CM/SW Contact  Marliss Coots, LCSW Phone Number: 06/14/2023, 9:16 AM  Clinical Narrative:     9:16 AM CSW attempted to call Bre ([facility] social worker) to confirm plans for discharge today to [facility] via [facility] transportation. There was no response and a voicemail was left requesting a return call.  10:06 AM Bre with [facility] returned this social worker's call to confirm discharge today. CSW confirmed discharge today and provided work phone to bedside RN for Bre to obtain nurse work number and report from them. Bedside RN relayed to CSW that Bre would call CSW once she obtains [facility] transportation ETA.  12:26 PM CSW called Bre with [facility] who confirmed ETA of approximately 1:15PM. CSW relayed this information to patient and patient's granddaughter, [name].   Expected Discharge Plan: Skilled Nursing Facility Barriers to Discharge: Barriers Resolved  Expected Discharge Plan and Services In-house Referral: Clinical Social Work   Post Acute Care Choice: Skilled Nursing Facility Living arrangements for the past 2 months: Single Family Home                                       Social Determinants of Health (SDOH) Interventions SDOH Screenings   Food Insecurity: No Food Insecurity (06/11/2023)  Housing: Low Risk  (06/11/2023)  Transportation Needs: No Transportation Needs (06/11/2023)  Utilities: Not At Risk (06/11/2023)  Financial Resource Strain: Low Risk  (02/13/2023)   Received from [facility]  Physical Activity: Insufficiently Active (02/24/2020)   Received from [facility], [facility]  Social Connections: Unknown (11/03/2021)   Received from [facility], [facility]  Stress: Stress Concern Present (02/24/2020)   Received from [facility], [facility]  Tobacco Use: Medium Risk (06/11/2023)    Readmission Risk  Interventions    02/28/2023   11:02 AM  Readmission Risk Prevention Plan  Transportation Screening Complete  PCP or Specialist Appt within 3-5 Days Complete  HRI or Home Care Consult Complete  Social Work Consult for Recovery Care Planning/Counseling Complete  Palliative Care Screening Not Applicable  Medication Review Oceanographer) Complete

## 2023-06-14 NOTE — Discharge Summary (Signed)
Physician Discharge Summary  Tristan Gardner ZOX:[id number] DOB: 1934-11-29 DOA: 06/11/2023  PCP: [name], MD  Admit date: 06/11/2023 Discharge date: 06/14/2023  Admitted From: Home Disposition: Skilled nursing facility  Recommendations for Outpatient Follow-up:  Follow up with PCP in 1-2 weeks Please obtain BMP/CBC in one week   Discharge Condition: Fair CODE STATUS: Full code Diet recommendation: Regular diet, nutritional supplements  Discharge summary: 88 y.o. male with medical history significant of chronic A-fib on Xarelto, coronary artery disease, history of stroke, hypertension, hyperlipidemia, history of GI bleeding but currently stable who was at daycare center and was found to be lethargic, confused.  Blood pressure was 60/40.  Blood pressure improved with IV fluids and patient became more alert and awake.  He was more sleepy than usual so he was brought to the ER.  Patient does have chronic right shoulder pain and left knee pain but denied any other complaints.  Patient is poor historian.  Patient does not remember or recall why they sent him to the ER.   ED Course: Blood pressure stable.  Bradycardic that is chronic issue.Hemoglobin 9.5 and stable.  Electrolytes are stable.  EKG shows chronic A-fib.  CT head with no acute findings.  Chest x-ray with no acute findings.  Urine was essentially normal.  Patient receiving 1 L of normal saline in the ER.  Due to significant symptoms on presentation admission requested. Thought to be UTI so he was given a dose of Rocephin in the ER.  Patient ultimately stabilized.  He was found to be dehydrated and positive orthostatics.  He has significant musculoskeletal pain.   Assessment & Plan:    Hypotension: Primary cause likely dehydration.  Also on multiple antihypertensives.  Suspect dehydration.  Suspect orthostatic drop in blood pressure.  Patient received total 1.5 L of fluid.  Blood pressures are adequate.  Holding other antihypertensives  but resuming metoprolol. He does have a drop in orthostatic blood pressures however standing blood pressure is 110. Needs orthostatic precautions.  Discontinuing nitrates and losartan.   Essential hypertension: Patient is on nitrates, metoprolol and losartan.  Probably not tolerating all medications.  Will continue metoprolol but hold on nitrates and losartan.   Chronic pain: Patient is on baclofen and Tylenol.  Continue.  Will add Voltaren gel that he is responding to.   Chronic A-fib: Rate controlled.  On metoprolol that is continued.  Therapeutic on Xarelto.  No evidence of GI bleeding.  Continue Xarelto.  He is on PPI for protection.   Suspected UTI: Less likely.  Received 1 dose of Rocephin. blood cultures negative.  Urine culture reported less than 10,000 colonies of organisms.  No indication to treat.  No urinary symptoms.  Medically stable to discharge to SNF.  Discharge Diagnoses:  Principal Problem:   Hypotension Active Problems:   Essential hypertension   Dyslipidemia   Atrial fibrillation, chronic [facility])    Discharge Instructions  Discharge Instructions     Diet general   Complete by: As directed    Increase activity slowly   Complete by: As directed       Allergies as of 06/14/2023       Reactions   Iodine Hives, Itching, Rash      Shellfish Allergy Hives        Medication List     STOP taking these medications    isosorbide mononitrate 30 MG 24 hr tablet Commonly known as: IMDUR   losartan 25 MG tablet Commonly known as: COZAAR  TAKE these medications    acetaminophen 325 MG tablet Commonly known as: TYLENOL Take 2 tablets (650 mg total) by mouth every 6 (six) hours as needed for mild pain (or Fever >/= 101).   albuterol 108 (90 Base) MCG/ACT inhaler Commonly known as: VENTOLIN HFA Inhale 2 puffs into the lungs every 4 (four) hours as needed for wheezing or shortness of breath.   atorvastatin 40 MG tablet Commonly known as:  LIPITOR Take 40 mg by mouth at bedtime.   baclofen 10 MG tablet Commonly known as: LIORESAL Take 10 mg by mouth 3 (three) times daily.   diclofenac Sodium 1 % Gel Commonly known as: VOLTAREN Apply 4 g topically 4 (four) times daily.   DULoxetine 30 MG capsule Commonly known as: CYMBALTA Take 30 mg by mouth 2 (two) times daily.   gabapentin 300 MG capsule Commonly known as: NEURONTIN Take 300 mg by mouth 3 (three) times daily.   metoprolol succinate 50 MG 24 hr tablet Commonly known as: TOPROL-XL Take 50 mg by mouth daily.   pantoprazole 40 MG tablet Commonly known as: Protonix Take 1 tablet (40 mg total) by mouth 2 (two) times daily before a meal.   traZODone 50 MG tablet Commonly known as: DESYREL Take 50 mg by mouth at bedtime.   Xarelto 20 MG Tabs tablet Generic drug: rivaroxaban Take 1 tablet (20 mg total) by mouth every morning. START ON 7/10        Allergies  Allergen Reactions   Iodine Hives, Itching and Rash         Shellfish Allergy Hives    Consultations: None   Procedures/Studies: CT Head Wo Contrast Result Date: 06/11/2023 CLINICAL DATA:  Headache, new onset (Age >= 51y) EXAM: CT HEAD WITHOUT CONTRAST TECHNIQUE: Contiguous axial images were obtained from the base of the skull through the vertex without intravenous contrast. RADIATION DOSE REDUCTION: This exam was performed according to the departmental dose-optimization program which includes automated exposure control, adjustment of the mA and/or kV according to patient size and/or use of iterative reconstruction technique. COMPARISON:  CT head December 23, 2022. FINDINGS: Brain: No evidence of acute infarction, hemorrhage, hydrocephalus, extra-axial collection or mass lesion/mass effect. Similar patchy white matter hypodensities, compatible with chronic microvascular ischemic disease. Similar cerebral atrophy. Small remote right perirolandic infarct. Vascular: No hyperdense vessel identified Skull: No  acute fracture. Sinuses/Orbits: Mostly clear sinuses. Other: Widespread retained metallic foreign body scattered in the scalp and also in the medial canthus of the left orbit. IMPRESSION: No evidence of acute intracranial abnormality. Electronically Signed   By: Feliberto Harts M.D.   On: 06/11/2023 15:46   DG Chest 2 View Result Date: 06/11/2023 CLINICAL DATA:  Near syncope. EXAM: CHEST - 2 VIEW COMPARISON:  May 20, 2023. FINDINGS: The heart size and mediastinal contours are within normal limits. Both lungs are clear. The visualized skeletal structures are unremarkable. IMPRESSION: No active cardiopulmonary disease. Electronically Signed   By: Lupita Raider M.D.   On: 06/11/2023 14:35   MYOCARDIAL PERFUSION IMAGING Result Date: 05/30/2023   The study is normal. The study is low risk.   No ST deviation was noted.   Left ventricular function is normal. End diastolic cavity size is normal.   Prior study not available for comparison.   DG Chest 2 View Result Date: 05/20/2023 CLINICAL DATA:  Acute chest pain. Shortness of breath. Atrial fibrillation. EXAM: CHEST - 2 VIEW COMPARISON:  01/17/2023 FINDINGS: The heart size and mediastinal contours are within normal  limits. Both lungs are clear. The visualized skeletal structures are unremarkable. IMPRESSION: No active cardiopulmonary disease. Electronically Signed   By: Danae Orleans M.D.   On: 05/20/2023 10:36   (Echo, Carotid, EGD, Colonoscopy, ERCP)    Subjective: Patient seen in the morning rounds.  Denies any complaints.  Voltaren gel did not help with the knee pain.   Discharge Exam: Vitals:   06/14/23 0310 06/14/23 0807  BP: 133/64 (!) 102/58  Pulse: 60 (!) 59  Resp: 19 18  Temp: 98.6 F (37 C) 98.6 F (37 C)  SpO2: 97% 99%   Vitals:   06/13/23 2019 06/14/23 0310 06/14/23 0500 06/14/23 0807  BP: (!) 100/49 133/64  (!) 102/58  Pulse: (!) 59 60  (!) 59  Resp: 18 19  18   Temp: 98.6 F (37 C) 98.6 F (37 C)  98.6 F (37 C)   TempSrc: Oral Oral  Oral  SpO2: 100% 97%  99%  Weight:   108.8 kg   Height:        General: Pt is alert, awake, not in acute distress Frail and debilitated.  Not in any distress. He is mostly oriented.  Slow to respond. Cardiovascular: RRR, S1/S2 +, no rubs, no gallops Respiratory: CTA bilaterally, no wheezing, no rhonchi Abdominal: Soft, NT, ND, bowel sounds + Extremities: no edema, no cyanosis    The results of significant diagnostics from this hospitalization (including imaging, microbiology, ancillary and laboratory) are listed below for reference.     Microbiology: Recent Results (from the past 240 hours)  Urine Culture     Status: Abnormal   Collection Time: 06/11/23  2:36 PM   Specimen: Urine, Clean Catch  Result Value Ref Range Status   Specimen Description URINE, CLEAN CATCH  Final   Special Requests NONE Reflexed from [id number]  Final   Culture (A)  Final    <10,000 COLONIES/mL INSIGNIFICANT GROWTH Performed at [facility], [address] [place], [place] [address]    Report Status 06/12/2023 FINAL  Final  Blood culture (routine x 2)     Status: None (Preliminary result)   Collection Time: 06/11/23  6:53 PM   Specimen: BLOOD RIGHT ARM  Result Value Ref Range Status   Specimen Description BLOOD RIGHT ARM  Final   Special Requests   Final    BOTTLES DRAWN AEROBIC AND ANAEROBIC Blood Culture adequate volume   Culture   Final    NO GROWTH 3 DAYS Performed at [facility], [address], [place], [place] [address]    Report Status PENDING  Incomplete     Labs: BNP (last 3 results) Recent Labs    05/20/23 0955 06/11/23 1318  BNP 314.7* 196.7*   Basic Metabolic Panel: Recent Labs  Lab 06/11/23 1216 06/11/23 1357 06/12/23 0443  NA 138 141 138  K 4.1 4.1 3.6  CL 104  --  108  CO2 23  --  22  GLUCOSE 85  --  109*  BUN 12  --  10  CREATININE 1.34*  --  1.04  CALCIUM 9.0  --  8.6*  MG 2.0  --  1.9  PHOS  --   --  4.2   Liver Function  Tests: No results for input(s): "AST", "ALT", "ALKPHOS", "BILITOT", "PROT", "ALBUMIN" in the last 168 hours. No results for input(s): "LIPASE", "AMYLASE" in the last 168 hours. No results for input(s): "AMMONIA" in the last 168 hours. CBC: Recent Labs  Lab 06/11/23 1216 06/11/23 1357  06/12/23 0443  WBC 4.1  --  3.7*  NEUTROABS 2.5  --   --   HGB 8.6* 9.5* 8.4*  HCT 28.2* 28.0* 26.5*  MCV 93.1  --  91.7  PLT 237  --  199   Cardiac Enzymes: No results for input(s): "CKTOTAL", "CKMB", "CKMBINDEX", "TROPONINI" in the last 168 hours. BNP: Invalid input(s): "POCBNP" CBG: Recent Labs  Lab 06/11/23 1315  GLUCAP 88   D-Dimer No results for input(s): "DDIMER" in the last 72 hours. Hgb A1c No results for input(s): "HGBA1C" in the last 72 hours. Lipid Profile No results for input(s): "CHOL", "HDL", "LDLCALC", "TRIG", "CHOLHDL", "LDLDIRECT" in the last 72 hours. Thyroid function studies Recent Labs    06/11/23 1317  TSH 1.697   Anemia work up No results for input(s): "VITAMINB12", "FOLATE", "FERRITIN", "TIBC", "IRON", "RETICCTPCT" in the last 72 hours. Urinalysis    Component Value Date/Time   COLORURINE YELLOW 06/11/2023 1436   APPEARANCEUR CLEAR 06/11/2023 1436   LABSPEC 1.016 06/11/2023 1436   PHURINE 5.0 06/11/2023 1436   GLUCOSEU NEGATIVE 06/11/2023 1436   HGBUR NEGATIVE 06/11/2023 1436   BILIRUBINUR NEGATIVE 06/11/2023 1436   KETONESUR NEGATIVE 06/11/2023 1436   PROTEINUR NEGATIVE 06/11/2023 1436   NITRITE NEGATIVE 06/11/2023 1436   LEUKOCYTESUR SMALL (A) 06/11/2023 1436   Sepsis Labs Recent Labs  Lab 06/11/23 1216 06/12/23 0443  WBC 4.1 3.7*   Microbiology Recent Results (from the past 240 hours)  Urine Culture     Status: Abnormal   Collection Time: 06/11/23  2:36 PM   Specimen: Urine, Clean Catch  Result Value Ref Range Status   Specimen Description URINE, CLEAN CATCH  Final   Special Requests NONE Reflexed from [id number]  Final   Culture (A)  Final     <10,000 COLONIES/mL INSIGNIFICANT GROWTH Performed at [facility], [address] [place], [place] [address]    Report Status 06/12/2023 FINAL  Final  Blood culture (routine x 2)     Status: None (Preliminary result)   Collection Time: 06/11/23  6:53 PM   Specimen: BLOOD RIGHT ARM  Result Value Ref Range Status   Specimen Description BLOOD RIGHT ARM  Final   Special Requests   Final    BOTTLES DRAWN AEROBIC AND ANAEROBIC Blood Culture adequate volume   Culture   Final    NO GROWTH 3 DAYS Performed at [facility], [address], [place], [place] [address]    Report Status PENDING  Incomplete     Time coordinating discharge: 35 minutes  SIGNED:   Dorcas Carrow, MD  [facility] 06/14/2023, 9:30 AM

## 2023-06-16 LAB — CULTURE, BLOOD (ROUTINE X 2)
Culture: NO GROWTH
Special Requests: ADEQUATE

## 2023-06-28 ENCOUNTER — Ambulatory Visit: Payer: Medicare (Managed Care) | Attending: Cardiology | Admitting: Cardiology

## 2023-06-28 NOTE — Progress Notes (Deleted)
  Cardiology Office Note:   Date:  06/28/2023  ID:  Tristan Gardner, DOB [birth year], MRN [id number] PCP: [name], MD  [facility] Providers Cardiologist:  Perlie Gold, PA-C Electrophysiologist:  [name], MD { Click to update primary MD,subspecialty MD or APP then REFRESH:1}   History of Present Illness:   Tristan Gardner is a 88 y.o. male ***  Discussed the use of AI scribe software for clinical note transcription with the patient, who gave verbal consent to proceed.  History of Present Illness             Today patient denies chest pain, shortness of breath, lower extremity edema, fatigue, palpitations, melena, hematuria, hemoptysis, diaphoresis, weakness, presyncope, syncope, orthopnea, and PND.   Studies Reviewed:    EKG:        ***  Risk Assessment/Calculations:   {Does this patient have ATRIAL FIBRILLATION?:[phone]} No BP recorded.  {Refresh Note OR Click here to enter BP  :1}***        Physical Exam:   VS:  There were no vitals taken for this visit.   Wt Readings from Last 3 Encounters:  06/14/23 239 lb 13.8 oz (108.8 kg)  06/07/23 248 lb 3.2 oz (112.6 kg)  05/18/23 248 lb 4.8 oz (112.6 kg)     Physical Exam  Physical Exam           ASSESSMENT AND PLAN:     Assessment and Plan                  {Are you ordering a CV Procedure (e.g. stress test, cath, DCCV, TEE, etc)?   Press F2        :[id number]}   Signed, Perlie Gold, PA-C

## 2023-07-26 NOTE — Progress Notes (Deleted)
 Electrophysiology Office Note:   Date:  07/26/2023  ID:  Tristan Gardner, DOB [birth year], MRN [id number]  Primary Cardiologist: [name], MD Electrophysiologist: [name], MD  {Click to update primary MD,subspecialty MD or APP then REFRESH:1}    History of Present Illness:   Tristan Gardner is a 89 y.o. 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  He was recently hospitalized with blood loss anemia requiring blood transfusions. He had GI evaluation including enteroscopy that showed red blood noted in stomach but no obvious source. Bleeding occurred while taking Xarelto 10 mg daily and Plavix 75 mg daily. Patient saw Dr. Rosemary Holms for follow up. Plavix was discontinued.   Discussed the use of AI scribe software for clinical note transcription with the patient, who gave verbal consent to proceed.  History of Present Illness            Review of systems complete and found to be negative unless listed in HPI.   EP Information / Studies Reviewed:    EKG is not ordered today. EKG from 06/12/23 reviewed which showed atrial flutter with variable conduction and slow ventricular response.      Nuclear Stress 05/30/23:   The study is normal. The study is low risk.   No ST deviation was noted.   Left ventricular function is normal. End diastolic cavity size is normal.   Prior study not available for comparison.  Echo 12/25/22:   1. Left ventricular ejection fraction, by estimation, is 50 to 55%. The  left ventricle has low normal function. The left ventricle has no regional  wall motion abnormalities. There is moderate concentric left ventricular  hypertrophy. Left ventricular  diastolic function could not be evaluated.   2. Right ventricular systolic function was not well visualized. The right  ventricular size is not well  visualized. There is normal pulmonary artery  systolic pressure.   3. Left atrial size was mild to moderately dilated.   4. The mitral valve is normal in structure. Mild mitral valve  regurgitation. No evidence of mitral stenosis.   5. The aortic valve is tricuspid. There is mild calcification of the  aortic valve. There is mild thickening of the aortic valve. Aortic valve  regurgitation is not visualized.   6. Aortic dilatation noted. There is mild dilatation of the ascending  aorta, measuring 40 mm.   7. The inferior vena cava is normal in size with greater than 50%  respiratory variability, suggesting right atrial pressure of 3 mmHg.   Risk Assessment/Calculations:    CHA2DS2-VASc Score = 5  {Confirm score is correct.  If not, click here to update score.  REFRESH note.  :1} This indicates a 7.2% annual risk of stroke. The patient's score is based upon: CHF History: 0 HTN History: 1 Diabetes History: 0 Stroke History: 2 Vascular Disease History: 0 Age Score: 2 Gender Score: 0   {This patient has a significant risk of stroke if diagnosed with atrial fibrillation.  Please consider VKA or DOAC agent for anticoagulation if the bleeding risk is acceptable.   You can also use the SmartPhrase .[email or web address] for documentation.   :[id number]} No BP recorded.  {Refresh Note OR Click here to enter BP  :1}***        Physical Exam:   VS:  There were no vitals taken for this visit.   Wt Readings from Last 3 Encounters:  06/14/23 239 lb 13.8 oz (108.8 kg)  06/07/23 248 lb 3.2 oz (112.6 kg)  05/18/23 248 lb 4.8 oz (112.6 kg)     GEN: Well nourished, well developed in no acute distress NECK: No JVD CARDIAC: {EPRHYTHM:28826}, no murmurs, rubs, gallops RESPIRATORY:  Clear to auscultation without rales, wheezing or rhonchi  ABDOMEN: Soft, non-tender, non-distended EXTREMITIES:  No edema; No deformity   ASSESSMENT AND PLAN:   Tristan Gardner is a 89 y.o. 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  I have seen Tristan Gardner in the office today who is being considered for a Watchman left atrial appendage closure device. I believe they will benefit from this procedure given their history of atrial fibrillation, CHA2DS2-VASc score of *** and unadjusted ischemic stroke rate of ***% per year. Unfortunately, the patient is not felt to be a long term anticoagulation candidate secondary to ***. The patient's chart has been reviewed and I feel that they would be a candidate for short term oral anticoagulation after Watchman implant.   It is my belief that after undergoing a LAA closure procedure, Tristan Gardner will not need long term anticoagulation which eliminates anticoagulation side effects and major bleeding risk.   Procedural risks for the Watchman implant have been reviewed with the patient including a 0.5% risk of stroke, <1% risk of perforation and <1% risk of device embolization. Other risks include bleeding, vascular damage, tamponade, worsening renal function, and death. The patient understands these risk and wishes to proceed.     The published clinical data on the safety and effectiveness of WATCHMAN include but are not limited to the following: - Holmes DR, Everlene Farrier, Sick P et al. for the PROTECT AF Investigators. Percutaneous closure of the left atrial appendage versus warfarin therapy for prevention of stroke in patients with atrial fibrillation: a randomised non-inferiority trial. Lancet 2009; 374: 534-42. Everlene Farrier, Doshi SK, Isa Rankin D et al. on behalf of the PROTECT AF Investigators. Percutaneous Left Atrial Appendage Closure for Stroke Prophylaxis in Patients With Atrial Fibrillation 2.3-Year Follow-up of the PROTECT AF (Watchman Left Atrial Appendage System for Embolic Protection in Patients With Atrial Fibrillation) Trial.  Circulation 2013; 127:720-729. - Alli O, Doshi S,  Kar S, Reddy VY, Sievert H et al. Quality of Life Assessment in the Randomized PROTECT AF (Percutaneous Closure of the Left Atrial Appendage Versus Warfarin Therapy for Prevention of Stroke in Patients With Atrial Fibrillation) Trial of Patients at Risk for Stroke With Nonvalvular Atrial Fibrillation. J Am Coll Cardiol 2013; 61:1790-8. Aline August DR, Mia Creek, Price M, Whisenant B, Sievert H, Doshi S, Huber K, Reddy V. Prospective randomized evaluation of the Watchman left atrial appendage Device in patients with atrial fibrillation versus long-term warfarin therapy; the PREVAIL trial. Journal of the [employer] of Cardiology, Vol. 4, No. 1, 2014, 1-11. - Kar S, Doshi SK, Sadhu A, Horton R, Osorio J et al. Primary outcome evaluation of a next-generation left atrial appendage closure device: results from the PINNACLE FLX trial. Circulation 2021;143(18)1754-1762.    After today's visit with the patient which was dedicated solely for shared decision making visit regarding LAA closure device, the patient decided to proceed with the LAA appendage closure procedure scheduled to be done in the near future at [facility]. Prior to the procedure, I would like to obtain a gated CT scan of the chest with  contrast timed for PV/LA visualization.   Additionally, the patient will need ***.  HAS-BLED score 4 Hypertension Yes  Abnormal renal and liver function (Dialysis, transplant, Cr >2.26 mg/dL /Cirrhosis or Bilirubin >2x Normal or AST/ALT/AP >3x Normal) No  Stroke Yes  Bleeding Yes  Labile INR (Unstable/high INR) No  Elderly (>65) Yes  Drugs or alcohol (>= 8 drinks/week, anti-plt or NSAID) No   CHA2DS2-VASc Score = 5  The patient's score is based upon: CHF History: 0 HTN History: 1 Diabetes History: 0 Stroke History: 2 Vascular Disease History: 0 Age Score: 2 Gender Score: 0   {Confirm score is correct.  If not, click here to update score.   REFRESH note.  :1}    ASSESSMENT AND PLAN: Permanent Atrial Fibrillation/Flutter The patient's CHA2DS2-VASc score is 5, indicating a 7.2% annual risk of stroke.  ***  Secondary Hypercoagulable State (ICD10:  D68.69){Click to add to Prob List or Visit Dx  :[id number]} The patient is at significant risk for stroke/thromboembolism based upon his CHA2DS2-VASc Score of 5.  Continue Rivaroxaban (Xarelto).   Follow up with {HKVQQ:59563} {EPFOLLOW OV:56433}  Signed, [name], MD

## 2023-07-27 ENCOUNTER — Ambulatory Visit: Payer: Medicare (Managed Care) | Admitting: Cardiology

## 2023-08-22 NOTE — Progress Notes (Deleted)
 [facility] CONSULT NOTE  Patient Care Team: Tristan Northern, MD as PCP - General (Internal Medicine) Tristan Putnam, MD as PCP - Electrophysiology (Cardiology) Tristan Negus, MD as PCP - Cardiology (Cardiology)  ASSESSMENT & PLAN:  89 y.o.male with history of atrial fibrillation, CAD, stroke being seen for iron deficiency anemia.  Received monoferric on 06/07/23.  Previously he was on both antiplatelet and anticoagulation.  Currently he is on the anticoagulation.  Relevant history: History of both antiplatelet and anticoagulation. Last colonoscopy: 2024  Last EGD: 2024  Repeat labs today showed .  B12 deficiency Will start b12 1000 mcg daily Follow up with labs on 12/20 and appointment  IDA Will order iv iron if indicated CBC, Iron, TIBC, ferritin Repeat labs in 3 months.  Folate deficiency Continue folic acid 1 mg daily  All questions were answered. The patient knows to call the clinic with any problems, questions or concerns.  Tristan Sartorius, MD 2/19/202510:47 PM   CHIEF COMPLAINTS/PURPOSE OF CONSULTATION:  Anemia  HISTORY OF PRESENTING ILLNESS:  Tristan Gardner 89 y.o. male is here because of anemia.  He has history of atrial fibrillation on chronic anticoagulation.  02/2023 he presented with symptomatic anemia, syncope, hemoglobin was 6.4.  Reported previously was also on Plavix for history of stroke.  Ferritin was 8 in August 2024 with microcytic anemia.  B12 was low.  10/23/22 colonoscopy The perianal and digital rectal examinations were normal. A large amount of liquid semi- liquid stool was found in the entire colon, interfering with visualization. Small, medium, and flat lesions could not be excluded due to the remaining stool. A single medium- sized angiodysplastic lesion without bleeding was found in the proximal ascending colon. The exam was otherwise without abnormality on direct and retroflexion views.  EGD - Normal esophagus. - Non-  bleeding gastric ulcers with no stigmata of bleeding. Biopsied. - Gastropathy and gastric erosions. - Normal examined duodenum.  02/27/23 Small bowel enteroscopy Red blood was found in the cardia. The entire examined stomach was normal. A single angiodysplastic lesion with no bleeding was found in the third portion of the duodenum. For hemostasis, one hemostatic clip was successfully placed. There was no bleeding at the end of the procedure. There was no evidence of significant pathology at 140 cm ( from the incisors)  He was on plavix until discontinued when saw cardiologist.  MEDICAL HISTORY:  Past Medical History:  Diagnosis Date   Atrial fibrillation (HCC)    Coronary artery disease    Diabetes mellitus without complication (HCC)    Hypertension    NSTEMI (non-ST elevated myocardial infarction) (HCC)    Stroke [facility])     SURGICAL HISTORY: Past Surgical History:  Procedure Laterality Date   BIOPSY  10/23/2022   Procedure: BIOPSY;  Surgeon: [name], MD;  Location: [facility] ENDOSCOPY;  Service: Gastroenterology;;   CARDIAC SURGERY     COLONOSCOPY WITH PROPOFOL N/A 10/23/2022   Procedure: COLONOSCOPY WITH PROPOFOL;  Surgeon: [name], MD;  Location: [facility] ENDOSCOPY;  Service: Gastroenterology;  Laterality: N/A;   ENTEROSCOPY N/A 02/27/2023   Procedure: ENTEROSCOPY;  Surgeon: Lynann Bologna, DO;  Location: [facility] ENDOSCOPY;  Service: Gastroenterology;  Laterality: N/A;   ESOPHAGOGASTRODUODENOSCOPY (EGD) WITH PROPOFOL N/A 10/23/2022   Procedure: ESOPHAGOGASTRODUODENOSCOPY (EGD) WITH PROPOFOL;  Surgeon: [name], MD;  Location: [facility] ENDOSCOPY;  Service: Gastroenterology;  Laterality: N/A;   HEMOSTASIS CLIP PLACEMENT  02/27/2023   Procedure: HEMOSTASIS CLIP PLACEMENT;  Surgeon: Lynann Bologna, DO;  Location: [facility] ENDOSCOPY;  Service: Gastroenterology;;   IR INJECT/THERA/INC NEEDLE/CATH/PLC EPI/LUMB/SAC W/IMG  12/27/2022   LUMBAR LAMINECTOMY/DECOMPRESSION MICRODISCECTOMY N/A  01/03/2023   Procedure: Lumbar Two-Three, Lumbar Three-Four, Lumbar Four-Five Open laminectomies;  Surgeon: [name], MD;  Location: [facility] OR;  Service: Neurosurgery;  Laterality: N/A;   RADIOLOGY WITH ANESTHESIA N/A 12/27/2022   Procedure: TOTAL SPINE MYELOGRAM;  Surgeon: Radiologist, Medication, MD;  Location: [facility] OR;  Service: Radiology;  Laterality: N/A;   ROTATOR CUFF REPAIR     ALLERGIES:  is allergic to iodine and shellfish allergy.  MEDICATIONS:  Current Outpatient Medications  Medication Sig Dispense Refill   acetaminophen (TYLENOL) 325 MG tablet Take 2 tablets (650 mg total) by mouth every 6 (six) hours as needed for mild pain (or Fever >/= 101).     albuterol (VENTOLIN HFA) 108 (90 Base) MCG/ACT inhaler Inhale 2 puffs into the lungs every 4 (four) hours as needed for wheezing or shortness of breath.     atorvastatin (LIPITOR) 40 MG tablet Take 40 mg by mouth at bedtime.     baclofen (LIORESAL) 10 MG tablet Take 10 mg by mouth 3 (three) times daily.     diclofenac Sodium (VOLTAREN) 1 % GEL Apply 4 g topically 4 (four) times daily.     DULoxetine (CYMBALTA) 30 MG capsule Take 30 mg by mouth 2 (two) times daily.     gabapentin (NEURONTIN) 300 MG capsule Take 300 mg by mouth 3 (three) times daily.     metoprolol succinate (TOPROL-XL) 50 MG 24 hr tablet Take 50 mg by mouth daily.     pantoprazole (PROTONIX) 40 MG tablet Take 1 tablet (40 mg total) by mouth 2 (two) times daily before a meal. (Patient not taking: Reported on 06/11/2023) 120 tablet 0   traZODone (DESYREL) 50 MG tablet Take 50 mg by mouth at bedtime.     XARELTO 20 MG TABS tablet Take 1 tablet (20 mg total) by mouth every morning. START ON 7/10 30 tablet    No current facility-administered medications for this visit.    REVIEW OF SYSTEMS:   Respiratory: Denies shortness of breath Cardiovascular: Denies chest pain, chest discomfort Gastrointestinal:  Denies nausea, abdominal pain, melena or bloody stools GU: no  hematuria Skin: skin color change All other systems were reviewed with the patient and are negative.  PHYSICAL EXAMINATION:  There were no vitals filed for this visit.  There were no vitals filed for this visit.   GENERAL: alert, no distress and comfortable on wheelchair  SKIN: skin color normal EYES: normal conjunctiva, sclera clear LUNGS: normal breathing effort HEART: regular rate & rhythm ABDOMEN: abdomen soft, non-tender and nondistended  RADIOGRAPHIC STUDIES: I have personally reviewed the radiological images as listed and agreed with the findings in the report. No results found.

## 2023-08-23 ENCOUNTER — Inpatient Hospital Stay: Payer: Medicare (Managed Care)

## 2023-08-25 ENCOUNTER — Emergency Department (HOSPITAL_COMMUNITY): Payer: Medicare (Managed Care)

## 2023-08-25 ENCOUNTER — Other Ambulatory Visit: Payer: Self-pay

## 2023-08-25 ENCOUNTER — Emergency Department (HOSPITAL_COMMUNITY)
Admission: EM | Admit: 2023-08-25 | Discharge: 2023-08-28 | Disposition: A | Discharge status: Home / Self Care | Payer: Medicare (Managed Care) | Attending: Emergency Medicine | Admitting: Emergency Medicine

## 2023-08-25 DIAGNOSIS — M545 Low back pain, unspecified: Secondary | ICD-10-CM | POA: Insufficient documentation

## 2023-08-25 DIAGNOSIS — R262 Difficulty in walking, not elsewhere classified: Secondary | ICD-10-CM | POA: Insufficient documentation

## 2023-08-25 DIAGNOSIS — R531 Weakness: Secondary | ICD-10-CM | POA: Insufficient documentation

## 2023-08-25 DIAGNOSIS — Z7901 Long term (current) use of anticoagulants: Secondary | ICD-10-CM | POA: Insufficient documentation

## 2023-08-25 DIAGNOSIS — M48062 Spinal stenosis, lumbar region with neurogenic claudication: Secondary | ICD-10-CM | POA: Diagnosis not present

## 2023-08-25 LAB — COMPREHENSIVE METABOLIC PANEL
ALT: 13 U/L (ref 0–44)
AST: 20 U/L (ref 15–41)
Albumin: 3.2 g/dL — ABNORMAL LOW (ref 3.5–5.0)
Alkaline Phosphatase: 67 U/L (ref 38–126)
Anion gap: 13 (ref 5–15)
BUN: 11 mg/dL (ref 8–23)
CO2: 23 mmol/L (ref 22–32)
Calcium: 10.6 mg/dL — ABNORMAL HIGH (ref 8.9–10.3)
Chloride: 100 mmol/L (ref 98–111)
Creatinine, Ser: 0.99 mg/dL (ref 0.61–1.24)
GFR, Estimated: >60 mL/min (ref >60)
Glucose, Bld: 109 mg/dL — ABNORMAL HIGH (ref 70–99)
Potassium: 4.2 mmol/L (ref 3.5–5.1)
Sodium: 136 mmol/L (ref 135–145)
Total Bilirubin: 0.5 mg/dL (ref 0.0–1.2)
Total Protein: 7.7 g/dL (ref 6.5–8.1)

## 2023-08-25 LAB — TYPE AND SCREEN
ABO/RH(D): O POS
Antibody Screen: NEGATIVE

## 2023-08-25 LAB — CBC WITH DIFFERENTIAL/PLATELET
Abs Immature Granulocytes: 0.01 10*3/uL (ref 0.00–0.07)
Basophils Absolute: 0 10*3/uL (ref 0.0–0.1)
Basophils Relative: 0 %
Eosinophils Absolute: 0.1 10*3/uL (ref 0.0–0.5)
Eosinophils Relative: 4 %
HCT: 33.3 % — ABNORMAL LOW (ref 39.0–52.0)
Hemoglobin: 10.6 g/dL — ABNORMAL LOW (ref 13.0–17.0)
Immature Granulocytes: 0 %
Lymphocytes Relative: 33 %
Lymphs Abs: 1.2 10*3/uL (ref 0.7–4.0)
MCH: 27.9 pg (ref 26.0–34.0)
MCHC: 31.8 g/dL (ref 30.0–36.0)
MCV: 87.6 fL (ref 80.0–100.0)
Monocytes Absolute: 0.5 10*3/uL (ref 0.1–1.0)
Monocytes Relative: 15 %
Neutro Abs: 1.8 10*3/uL (ref 1.7–7.7)
Neutrophils Relative %: 48 %
Platelets: 232 10*3/uL (ref 150–400)
RBC: 3.8 MIL/uL — ABNORMAL LOW (ref 4.22–5.81)
RDW: 15.9 % — ABNORMAL HIGH (ref 11.5–15.5)
WBC: 3.7 10*3/uL — ABNORMAL LOW (ref 4.0–10.5)
nRBC: 0 % (ref 0.0–0.2)

## 2023-08-25 LAB — URINALYSIS, W/ REFLEX TO CULTURE (INFECTION SUSPECTED)
Bacteria, UA: NONE SEEN
Bilirubin Urine: NEGATIVE
Glucose, UA: NEGATIVE mg/dL
Hgb urine dipstick: NEGATIVE
Ketones, ur: NEGATIVE mg/dL
Leukocytes,Ua: NEGATIVE
Nitrite: NEGATIVE
Protein, ur: NEGATIVE mg/dL
Specific Gravity, Urine: 1.005 (ref 1.005–1.030)
pH: 7 (ref 5.0–8.0)

## 2023-08-25 MED ORDER — ATORVASTATIN CALCIUM 40 MG PO TABS
40.0000 mg | ORAL_TABLET | Freq: Every day | ORAL | Status: DC
  Administered 2023-08-25 – 2023-08-27 (×3): 40 mg via ORAL
  Filled 2023-08-25 (×3): qty 1

## 2023-08-25 MED ORDER — ALBUTEROL SULFATE HFA 108 (90 BASE) MCG/ACT IN AERS: 2.0000 | INHALATION_SPRAY | RESPIRATORY_TRACT | Status: DC | PRN

## 2023-08-25 MED ORDER — RIVAROXABAN 10 MG PO TABS
20.0000 mg | ORAL_TABLET | Freq: Every morning | ORAL | Status: DC
  Administered 2023-08-26 – 2023-08-28 (×3): 20 mg via ORAL
  Filled 2023-08-25 (×3): qty 2

## 2023-08-25 MED ORDER — OXYCODONE-ACETAMINOPHEN 5-325 MG PO TABS
1.0000 | ORAL_TABLET | Freq: Once | ORAL | Status: AC
Start: 2023-08-25 — End: 2023-08-25
  Administered 2023-08-25: 1 via ORAL
  Filled 2023-08-25: qty 1

## 2023-08-25 MED ORDER — ALBUTEROL SULFATE (2.5 MG/3ML) 0.083% IN NEBU: 2.5000 mg | INHALATION_SOLUTION | RESPIRATORY_TRACT | Status: DC | PRN

## 2023-08-25 MED ORDER — METOPROLOL SUCCINATE ER 25 MG PO TB24
50.0000 mg | ORAL_TABLET | Freq: Every day | ORAL | Status: DC
  Administered 2023-08-25 – 2023-08-27 (×3): 50 mg via ORAL
  Filled 2023-08-25 (×4): qty 2

## 2023-08-25 MED ORDER — DULOXETINE HCL 30 MG PO CPEP
30.0000 mg | ORAL_CAPSULE | Freq: Two times a day (BID) | ORAL | Status: DC
  Administered 2023-08-25 – 2023-08-28 (×6): 30 mg via ORAL
  Filled 2023-08-25 (×7): qty 1

## 2023-08-25 MED ORDER — GABAPENTIN 300 MG PO CAPS
300.0000 mg | ORAL_CAPSULE | Freq: Three times a day (TID) | ORAL | Status: DC
Start: 2023-08-25 — End: 2023-08-28
  Administered 2023-08-25 – 2023-08-28 (×9): 300 mg via ORAL
  Filled 2023-08-25 (×6): qty 1
  Filled 2023-08-25: qty 3
  Filled 2023-08-25 (×2): qty 1

## 2023-08-25 MED ORDER — ONDANSETRON HCL 4 MG/2ML IJ SOLN
4.0000 mg | Freq: Once | INTRAMUSCULAR | Status: AC
  Administered 2023-08-25: 4 mg via INTRAVENOUS
  Filled 2023-08-25: qty 2

## 2023-08-25 MED ORDER — PANTOPRAZOLE SODIUM 40 MG PO TBEC
40.0000 mg | DELAYED_RELEASE_TABLET | Freq: Two times a day (BID) | ORAL | Status: DC
  Administered 2023-08-25 – 2023-08-28 (×6): 40 mg via ORAL
  Filled 2023-08-25 (×6): qty 1

## 2023-08-25 MED ORDER — TRAZODONE HCL 50 MG PO TABS
50.0000 mg | ORAL_TABLET | Freq: Every day | ORAL | Status: DC
  Administered 2023-08-25 – 2023-08-27 (×3): 50 mg via ORAL
  Filled 2023-08-25 (×3): qty 1

## 2023-08-25 MED ORDER — HYDROMORPHONE HCL 1 MG/ML IJ SOLN
1.0000 mg | Freq: Once | INTRAMUSCULAR | Status: AC
  Administered 2023-08-25: 1 mg via INTRAVENOUS
  Filled 2023-08-25: qty 1

## 2023-08-25 NOTE — ED Provider Notes (Signed)
 [place] EMERGENCY DEPARTMENT AT [facility] Provider Note   CSN: [id number] Arrival date & time: 08/25/23  0405     History  Chief Complaint  Patient presents with   Back Pain    Tristan Gardner is a 89 y.o. male.  Patient presents to the emergency department for evaluation of low back pain.  Patient reports that the pain started in the morning and got worse through the day.  He reports that he now cannot stand at all.  He had been lying in the bed for some time, every time he tried to get up he would fall back into the bed.  Pain is more on the right side, going to the right leg.       Home Medications Prior to Admission medications   Medication Sig Start Date End Date Taking? Authorizing Provider  acetaminophen (TYLENOL) 325 MG tablet Take 2 tablets (650 mg total) by mouth every 6 (six) hours as needed for mild pain (or Fever >/= 101). 10/24/22   Elgergawy, [name], MD  albuterol (VENTOLIN HFA) 108 (90 Base) MCG/ACT inhaler Inhale 2 puffs into the lungs every 4 (four) hours as needed for wheezing or shortness of breath. 02/24/20   [provider]  atorvastatin (LIPITOR) 40 MG tablet Take 40 mg by mouth at bedtime. 08/24/22   [provider]  baclofen (LIORESAL) 10 MG tablet Take 10 mg by mouth 3 (three) times daily.    [provider]  diclofenac Sodium (VOLTAREN) 1 % GEL Apply 4 g topically 4 (four) times daily. 06/14/23   Dorcas Carrow, MD  DULoxetine (CYMBALTA) 30 MG capsule Take 30 mg by mouth 2 (two) times daily.    [provider]  gabapentin (NEURONTIN) 300 MG capsule Take 300 mg by mouth 3 (three) times daily.    [provider]  metoprolol succinate (TOPROL-XL) 50 MG 24 hr tablet Take 50 mg by mouth daily. 08/24/22   [provider]  pantoprazole (PROTONIX) 40 MG tablet Take 1 tablet (40 mg total) by mouth 2 (two) times daily before a meal. Patient not taking: Reported on 06/11/2023 03/02/23 06/11/23  Lewie Chamber, MD  traZODone (DESYREL) 50 MG tablet Take 50 mg by mouth at bedtime.    [provider]  XARELTO 20 MG TABS tablet Take 1 tablet (20 mg total) by mouth every morning. START ON 7/10 01/10/23   Ghimire, Werner Lean, MD      Allergies    Iodine and Shellfish allergy    Review of Systems   Review of Systems  Physical Exam Updated Vital Signs BP (!) 147/83 (BP Location: Left Arm)   Pulse (!) 57   Temp 97.7 F (36.5 C) (Oral)   Resp 16   SpO2 100%  Physical Exam Vitals and nursing note reviewed.  Constitutional:      General: He is not in acute distress.    Appearance: He is well-developed.  HENT:     Head: Normocephalic and atraumatic.     Mouth/Throat:     Mouth: Mucous membranes are moist.  Eyes:     General: Vision grossly intact. Gaze aligned appropriately.     Extraocular Movements: Extraocular movements intact.     Conjunctiva/sclera: Conjunctivae normal.  Cardiovascular:     Rate and Rhythm: Normal rate and regular rhythm.     Pulses: Normal pulses.     Heart sounds: Normal heart sounds, S1 normal and S2 normal. No murmur heard.    No friction  rub. No gallop.  Pulmonary:     Effort: Pulmonary effort is normal. No respiratory distress.     Breath sounds: Normal breath sounds.  Abdominal:     Palpations: Abdomen is soft.     Tenderness: There is no abdominal tenderness. There is no guarding or rebound.     Hernia: No hernia is present.  Musculoskeletal:        General: No swelling.     Cervical back: Full passive range of motion without pain, normal range of motion and neck supple. No pain with movement, spinous process tenderness or muscular tenderness. Normal range of motion.     Right lower leg: No edema.     Left lower leg: No edema.     Comments: Patient able to lift left leg off the bed against gravity but falls back to the bed.  Patient cannot lift the right leg off the bed at all.  Skin:    General: Skin is warm and dry.     Capillary Refill:  Capillary refill takes less than 2 seconds.     Findings: No ecchymosis, erythema, lesion or wound.  Neurological:     Mental Status: He is alert and oriented to person, place, and time.     GCS: GCS eye subscore is 4. GCS verbal subscore is 5. GCS motor subscore is 6.     Cranial Nerves: Cranial nerves 2-12 are intact.     Sensory: Sensation is intact.     Motor: Motor function is intact. No weakness or abnormal muscle tone.     Coordination: Coordination is intact.  Psychiatric:        Mood and Affect: Mood normal.        Speech: Speech normal.        Behavior: Behavior normal.     ED Results / Procedures / Treatments   Labs (all labs ordered are listed, but only abnormal results are displayed) Labs Reviewed  CBC WITH DIFFERENTIAL/PLATELET - Abnormal; Notable for the following components:      Result Value   WBC 3.7 (*)    RBC 3.80 (*)    Hemoglobin 10.6 (*)    HCT 33.3 (*)    RDW 15.9 (*)    All other components within normal limits  COMPREHENSIVE METABOLIC PANEL - Abnormal; Notable for the following components:   Glucose, Bld 109 (*)    Calcium 10.6 (*)    Albumin 3.2 (*)    All other components within normal limits  URINALYSIS, W/ REFLEX TO CULTURE (INFECTION SUSPECTED)  TYPE AND SCREEN    EKG None  Radiology No results found.  Procedures Procedures    Medications Ordered in ED Medications  HYDROmorphone (DILAUDID) injection 1 mg (1 mg Intravenous Given 08/25/23 0436)  ondansetron (ZOFRAN) injection 4 mg (4 mg Intravenous Given 08/25/23 0435)    ED Course/ Medical Decision Making/ A&P                                 Medical Decision Making Amount and/or Complexity of Data Reviewed Labs: ordered. Radiology: ordered.  Risk Prescription drug management.   Differential Diagnosis considered includes, but not limited to: Muscle strain; acute disc herniation; lumbar fracture; cauda equina syndrome; infection; malignancy   Patient with prior lumbar  surgery in 2024 presents to the emergency department for evaluation of patient presents to the ER with back pain and leg weakness.  Patient reports symptoms  are more on the right side.  Denies any falls or injury.  Patient with weakness of both lower extremities, has difficulty raising either off of the bed, right worse than left.  He has preserved sensation, no foot drop or saddle anesthesia.  Patient reports that he was incontinent of urine prior to EMS coming but that was because he could not get up off of the bed to get to the bathroom.  He knew he needed to urinate, could not hold it.  Patient provided analgesia and has improved.  He seems to be moving his legs better now that there is less painful and admission.  Will attempt MRI (for some reason he has not had prior MRIs here, but reports he did have an MRI for his shoulder somewhere else, no contraindication to MRIs and he does not report claustrophobia).  Will sign with oncoming ER physician to follow-up results of MRI.        Final Clinical Impression(s) / ED Diagnoses Final diagnoses:  Acute right-sided low back pain, unspecified whether sciatica present    Rx / DC Orders ED Discharge Orders     None         Gilda Crease, MD 08/25/23 [phone]

## 2023-08-25 NOTE — Evaluation (Signed)
 Physical Therapy Evaluation Patient Details Name: Tristan Gardner MRN: [id number] DOB: 05-07-1935 Today's Date: 08/25/2023  History of Present Illness  Pt is an 89 y.o. male admitted to [facility] hospital on 08/25/23 for acute onset R LE weakness and associated back pain.  Pt reports he was unable to get up out of the bed.  MRI of spine complete and neurosurgery reviewed the scan and said he did not need acute surgical intervention.  Pt with significant PMH of stroke, diabetes,A-fib, CAD, HTN, NSTEMI, and lumber laminectomy, decompression, and microdiscectomy 01/2023.  Clinical Impression  Pt was unable to sit EOB or come up to standing today.  He was able to roll bil and is very weak on his right leg.  His left leg is also impaired and flexes into spasms ~1 x per minute (he says this is normal, however, he was not always an accurate historian with me today). He is unable to stand and will not be safe to attempt standing without two people to assist, and likely would be good to attempt with a standing frame first where both legs can be blocked. At this time, he is appropriate for post acute rehab before he returns home.  His baseline is walking household distances with a rollator and he can certainly not do that right now.   PT to follow acutely for deficits listed below.         If plan is discharge home, recommend the following: Two people to help with bathing/dressing/bathroom;Two people to help with walking and/or transfers   Can travel by private vehicle   No    Equipment Recommendations Wheelchair (measurements PT);Wheelchair cushion (measurements PT);Hoyer lift;Hospital bed  Recommendations for Other Services       Functional Status Assessment Patient has had a recent decline in their functional status and demonstrates the ability to make significant improvements in function in a reasonable and predictable amount of time.     Precautions / Restrictions Precautions Precautions:  Fall Precaution/Restrictions Comments: pt reports h/o falls      Mobility  Bed Mobility Overal bed mobility: Needs Assistance Bed Mobility: Rolling Rolling: Min assist, Used rails         General bed mobility comments: Min assist to mostly help manage LEs in rolling bil.    Transfers                   General transfer comment: I did not feel safe attempting to transfer to standing without a second set or hands and being on the ED gurney where there is not enough room to sit up adequately.    Ambulation/Gait                  Stairs            Wheelchair Mobility     Tilt Bed    Modified Rankin (Stroke Patients Only)       Balance                                             Pertinent Vitals/Pain Pain Assessment Pain Assessment: Faces Faces Pain Scale: Hurts even more Pain Location: back and L LE when it spasms. Pain Descriptors / Indicators: Grimacing, Guarding Pain Intervention(s): Limited activity within patient's tolerance, Monitored during session, Repositioned    Home Living Family/patient expects to be discharged to:: Private residence Living Arrangements:  Other relatives Engineer, building services) Available Help at Discharge: Family;Available PRN/intermittently Type of Home: House Home Access: Level entry       Home Layout: One level Home Equipment: Rollator (4 wheels);Shower seat Additional Comments: aide M-F 3 hours 6pm-8pm    Prior Function Prior Level of Function : Needs assist             Mobility Comments: per pt report he needs assist to bathe, but can get around with his rollator on his own and manages his own toileting. ADLs Comments: Aide helps with bathing, meals     Extremity/Trunk Assessment   Upper Extremity Assessment Upper Extremity Assessment: Right hand dominant    Lower Extremity Assessment Lower Extremity Assessment: RLE deficits/detail;LLE deficits/detail RLE Deficits / Details: right  leg is weak and numb, r ankle 2+/5, right knee 2+/5, R hip 2+/5 RLE Sensation: decreased light touch LLE Deficits / Details: left leg is 3/5 throughout bed level assessment, sensation more intact on L than right.  He has a flexion spasm that hits him in this leg ~1 time every minute or so.  He tells me that has been present "since my fall" which he indicates was before his back surgery. LLE Sensation: WNL    Cervical / Trunk Assessment Cervical / Trunk Assessment: Back Surgery  Communication   Communication Communication: Impaired Factors Affecting Communication: Hearing impaired    Cognition Arousal: Alert Behavior During Therapy: WFL for tasks assessed/performed   PT - Cognitive impairments: No family/caregiver present to determine baseline                       PT - Cognition Comments: pt is a poor historian, he is also HOH Following commands: Intact       Cueing       General Comments      Exercises     Assessment/Plan    PT Assessment Patient needs continued PT services  PT Problem List Decreased strength;Decreased activity tolerance;Decreased balance;Decreased mobility;Decreased knowledge of use of DME;Pain;Impaired sensation       PT Treatment Interventions DME instruction;Gait training;Functional mobility training;Therapeutic activities;Therapeutic exercise;Balance training;Patient/family education;Neuromuscular re-education;Wheelchair mobility training    PT Goals (Current goals can be found in the Care Plan section)  Acute Rehab PT Goals Patient Stated Goal: to get his back better and go home PT Goal Formulation: With patient Time For Goal Achievement: 09/08/23 Potential to Achieve Goals: Good    Frequency Min 1X/week     Co-evaluation               AM-PAC PT "6 Clicks" Mobility  Outcome Measure Help needed turning from your back to your side while in a flat bed without using bedrails?: A Little Help needed moving from lying on your  back to sitting on the side of a flat bed without using bedrails?: Total Help needed moving to and from a bed to a chair (including a wheelchair)?: Total Help needed standing up from a chair using your arms (e.g., wheelchair or bedside chair)?: Total Help needed to walk in hospital room?: Total Help needed climbing 3-5 steps with a railing? : Total 6 Click Score: 8    End of Session   Activity Tolerance: Patient limited by pain Patient left: in bed;with call bell/phone within reach Nurse Communication: Other (comment) (communicated with RN tech) PT Visit Diagnosis: Muscle weakness (generalized) (M62.81);Difficulty in walking, not elsewhere classified (R26.2);Pain Pain - Right/Left: Right Pain - part of body: Leg (and back)  Time: 1700-1730 PT Time Calculation (min) (ACUTE ONLY): 30 min   Charges:   PT Evaluation $PT Eval Moderate Complexity: 1 Mod PT Treatments $Therapeutic Activity: 8-22 mins PT General Charges $$ ACUTE PT VISIT: 1 Visit        Corinna Capra, PT, DPT  Acute Rehabilitation Secure chat is best for contact #[phone] office    08/25/2023, 6:00 PM

## 2023-08-25 NOTE — ED Notes (Signed)
 Patient transported to MRI

## 2023-08-25 NOTE — ED Notes (Signed)
PT is at bedside.

## 2023-08-25 NOTE — ED Triage Notes (Signed)
 BIBA from home with reports of back pain, right hip pain, and BL leg weakness. EMS reports the pt was standing and suddenly felt legs give out, but did not fall, sat back down in the bed. Pain worsens with ROM.

## 2023-08-25 NOTE — ED Notes (Signed)
 ED Provider at bedside.

## 2023-08-25 NOTE — ED Provider Notes (Signed)
 7:27 AM Care assumed Dr. Oletta Cohn.  At time of transfer of care, patient awaiting results of MRI of his lumbar spine.  Based on report of his symptoms, anticipate he will be a neurosurgery consultation versus admission versus boarding for physical therapy to evaluate given his inability to safely ambulate this today.  MRI returned showing evidence of severe spinal stenosis at L1/2 and L 2/3 with possible conus mass effect but no cord changes at this time.  I reassessed the patient and he still has difficulty raising his right leg off the bed and it is numb compared to normal.  This right leg issue is new.  The left leg reportedly has some chronic problems.  Due to this new change, his pain, will call neurosurgery to discuss a plan.  Anticipate he will need either admission for symptom management and evaluation by them versus he will need PT evaluation and placement as he does not appear able to safely ambulate at this time and this is a new change since 2 days ago.  2:19 PM Just spoke to neurosurgery who reviewed the images and case.  They feel that the patient does not need emergent surgical intervention but may need surgical management at some point in future for these new changes.  Will go reassess the patient.  If patient truly is unable to safely stand or ambulate, he will likely need transition of care evaluation and PT evaluation to discuss placement.  If he is feeling somewhat better and can move his legs safely, will try to discharge with some new pain medicine.  3:11 PM On reassessment, patient was able to move both his legs much better.  We discussed outpatient neurosurgery follow-up and patient agrees.  Will give prescription for pain medicine and Lidoderm patches and he will be discharged for outpatient follow-up.  3:28 PM A nurse tech and myself tried to stand the patient he could not bear weight due to weakness and pain.  He was doing better than when I first met him but he does not feel  he is safe for discharge home.  Will consult physical therapy and transition of care team and he will become a border.   Clinical Impression: 1. Acute right-sided low back pain, unspecified whether sciatica present     Disposition: Now awaiting TOC and PT evaluation as he cannot safely stand and bear weight per evaluation.  This note was prepared with assistance of Conservation officer, historic buildings. Occasional wrong-word or sound-a-like substitutions may have occurred due to the inherent limitations of voice recognition software.       Tristan Gardner, Tristan Brim, MD 08/25/23 [phone]

## 2023-08-26 NOTE — NC FL2 (Signed)
 [place] MEDICAID FL2 LEVEL OF CARE FORM     IDENTIFICATION  Patient Name: Tristan Gardner Birthdate: 1935/02/24 Sex: male Admission Date (Current Location): 08/25/2023  [facility] and [place] Number:  Producer, television/film/video and Address:  [facility], [address] [place], [place] [address]      Provider Number: [id number]  Attending Physician Name and Address:  System, Provider Not In  Relative Name and Phone Number:       Current Level of Care: Hospital Recommended Level of Care: Skilled Nursing Facility Prior Approval Number:    Date Approved/Denied:   PASRR Number: [id number]  Discharge Plan: SNF    Current Diagnoses: Patient Active Problem List   Diagnosis Date Noted   Hypotension 06/11/2023   Chest pain 05/20/2023   Elevated troponin 05/20/2023   Essential hypertension 05/20/2023   Aortic root dilatation (HCC) 05/01/2023   Low serum vitamin B12 03/20/2023   Folate deficiency 03/20/2023   GI bleed 02/26/2023   Atrial fibrillation, chronic (HCC) 02/26/2023   Pain, dental 12/29/2022   Hepatic lesion 12/29/2022   Dyslipidemia 12/29/2022   Prediabetes 12/29/2022   Class 1 obesity due to excess calories with body mass index (BMI) of 34.0 to 34.9 in adult 12/29/2022   Lumbar spinal stenosis 12/28/2022   Near syncope 12/24/2022   Neurological deficit, transient 12/24/2022   H/O: CVA (cerebrovascular accident) 12/24/2022   Peripheral neuropathy 12/24/2022   Acute gastric ulcer with hemorrhage 10/23/2022   Gastrointestinal hemorrhage 10/23/2022   Iron deficiency anemia 10/19/2022   Heme positive stool 10/19/2022   Chest pain, rule out acute myocardial infarction 08/06/2020   Elevated blood pressure reading 08/06/2020   H/O iron deficiency anemia 08/06/2020   Atrial flutter (HCC) 05/18/2020   Atypical chest pain 05/17/2020   Chronic atrial fibrillation with RVR (HCC) 03/11/2020    Orientation RESPIRATION BLADDER Height & Weight      Self, Time, Situation, Place  Normal Continent Weight:   Height:     BEHAVIORAL SYMPTOMS/MOOD NEUROLOGICAL BOWEL NUTRITION STATUS      Continent Diet (See discharge summary)  AMBULATORY STATUS COMMUNICATION OF NEEDS Skin   Extensive Assist Verbally Normal                       Personal Care Assistance Level of Assistance  Bathing, Dressing, Feeding Bathing Assistance: Maximum assistance Feeding assistance: Maximum assistance Dressing Assistance: Maximum assistance     Functional Limitations Info  Sight, Hearing, Speech Sight Info: Impaired (Glasses) Hearing Info: Adequate Speech Info: Adequate    SPECIAL CARE FACTORS FREQUENCY  PT (By licensed PT), OT (By licensed OT)     PT Frequency: 5x weekly OT Frequency: 5x weekly            Contractures Contractures Info: Not present    Additional Factors Info  Code Status, Allergies Code Status Info: Full Code Allergies Info: Shellfish           Current Medications (08/26/2023):  This is the current hospital active medication list Current Facility-Administered Medications  Medication Dose Route Frequency Provider Last Rate Last Admin   albuterol (PROVENTIL) (2.5 MG/3ML) 0.083% nebulizer solution 2.5 mg  2.5 mg Nebulization Q4H PRN Tegeler, Canary Brim, MD       atorvastatin (LIPITOR) tablet 40 mg  40 mg Oral QHS Tegeler, Canary Brim, MD   40 mg at 08/25/23 2133   DULoxetine (CYMBALTA) DR capsule 30 mg  30 mg Oral BID Tegeler, Canary Brim, MD  30 mg at 08/26/23 1016   gabapentin (NEURONTIN) capsule 300 mg  300 mg Oral TID Tegeler, Canary Brim, MD   300 mg at 08/26/23 1016   metoprolol succinate (TOPROL-XL) 24 hr tablet 50 mg  50 mg Oral Daily Tegeler, Canary Brim, MD   50 mg at 08/26/23 1016   pantoprazole (PROTONIX) EC tablet 40 mg  40 mg Oral BID AC Tegeler, Canary Brim, MD   40 mg at 08/26/23 1016   rivaroxaban (XARELTO) tablet 20 mg  20 mg Oral q morning Tegeler, Canary Brim, MD   20 mg at 08/26/23  1023   traZODone (DESYREL) tablet 50 mg  50 mg Oral QHS Tegeler, Canary Brim, MD   50 mg at 08/25/23 2133   Current Outpatient Medications  Medication Sig Dispense Refill   acetaminophen (TYLENOL) 325 MG tablet Take 2 tablets (650 mg total) by mouth every 6 (six) hours as needed for mild pain (or Fever >/= 101).     albuterol (VENTOLIN HFA) 108 (90 Base) MCG/ACT inhaler Inhale 2 puffs into the lungs every 4 (four) hours as needed for wheezing or shortness of breath.     atorvastatin (LIPITOR) 40 MG tablet Take 40 mg by mouth at bedtime.     baclofen (LIORESAL) 10 MG tablet Take 10 mg by mouth 3 (three) times daily.     diclofenac Sodium (VOLTAREN) 1 % GEL Apply 4 g topically 4 (four) times daily.     DULoxetine (CYMBALTA) 30 MG capsule Take 30 mg by mouth 2 (two) times daily.     gabapentin (NEURONTIN) 300 MG capsule Take 300 mg by mouth 3 (three) times daily.     metoprolol succinate (TOPROL-XL) 50 MG 24 hr tablet Take 50 mg by mouth daily.     pantoprazole (PROTONIX) 40 MG tablet Take 1 tablet (40 mg total) by mouth 2 (two) times daily before a meal. (Patient not taking: Reported on 06/11/2023) 120 tablet 0   traZODone (DESYREL) 50 MG tablet Take 50 mg by mouth at bedtime.     XARELTO 20 MG TABS tablet Take 1 tablet (20 mg total) by mouth every morning. START ON 7/10 30 tablet      Discharge Medications: Please see discharge summary for a list of discharge medications.  Relevant Imaging Results:  Relevant Lab Results:   Additional Information SSN: [id number]  Inis Sizer, LCSW

## 2023-08-26 NOTE — ED Notes (Signed)
 Pt transferred to hospital bed

## 2023-08-26 NOTE — ED Provider Notes (Signed)
 Emergency Medicine Observation Re-evaluation Note  Tristan Gardner is a 89 y.o. male, seen on rounds today.  Pt initially presented to the ED for complaints of Back Pain Currently, the patient is awaiting PT, OT evaluation.  Patient initially presented yesterday for lower back pain and right hip pain.  He had difficulty standing.  MRI shows severe spinal stenosis with possible conus mass effect but no cord changes.  He has difficulty raising his right leg.  This was discussed with neurosurgery, who did not feel like he needed emergent intervention but may need surgery in the future.  Patient was not able to bear weight due to weakness and pain, so TOC was consulted and planned for physical therapy evaluation.  Today patient feels okay.  He reports pain only in his right hip.  Denies frank weakness or numbness.  Physical Exam  BP 119/65   Pulse (!) 58   Temp 97.6 F (36.4 C) (Oral)   Resp 18   SpO2 99%  Physical Exam General: Resting comfortably in bed Lungs: Normal respiratory effort Psych: Calm and cooperative Some difficulty with with raising right leg off the bed due to weakness and pain.  He has full strength of the knee, ankle bilaterally.  Normal sensation throughout.  ED Course / MDM  EKG:EKG Interpretation Date/Time:  Saturday August 25 2023 06:39:33 EST Ventricular Rate:  38 PR Interval:    QRS Duration:  104 QT Interval:  446 QTC Calculation: 355 R Axis:   -16  Text Interpretation: Atrial flutter Borderline left axis deviation Low voltage, precordial leads Abnormal R-wave progression, early transition Minimal ST depression, inferior leads when compared to prior, slower rate with aflutter. No STEMI Confirmed by Theda Belfast ([id number]) on 08/25/2023 11:40:02 AM  I have reviewed the labs performed to date as well as medications administered while in observation.  Recent changes in the last 24 hours include no changes.  Plan  Current plan is for [facility] and physical therapy  evaluation.Earlene Plater, Elenor Quinones, MD 08/26/23 [phone]

## 2023-08-26 NOTE — ED Notes (Signed)
 Hospital bed ordered.

## 2023-08-26 NOTE — Consult Note (Signed)
 Reason for Consult: Back pain, lumbar spinal stenosis Referring Physician: Dr. Claudean Kinds is an 89 y.o. male.  HPI: The patient is an 89 year old black male on Xarelto for A-fib on whom Dr. Johnsie Cancel performed a lumbar laminectomy on 01/03/2023 for spinal stenosis.  I do not see where he ever followed up in the office.  The patient was admitted on 06/11/2023 with generalized weakness and a UTI.  He was discharged on 06/14/2023.  He was seen in the ER yesterday complaining of back pain and weakness.  He was worked up with a lumbar MRI which demonstrated lumbar spinal stenosis.  A neurosurgical consultation was requested.  Recently the patient complains of back and right greater left leg pain.  He is not a great medical historian.    Past Medical History:  Diagnosis Date   Atrial fibrillation (HCC)    Coronary artery disease    Diabetes mellitus without complication (HCC)    Hypertension    NSTEMI (non-ST elevated myocardial infarction) (HCC)    Stroke [facility])     Past Surgical History:  Procedure Laterality Date   BIOPSY  10/23/2022   Procedure: BIOPSY;  Surgeon: [name], MD;  Location: [facility] ENDOSCOPY;  Service: Gastroenterology;;   CARDIAC SURGERY     COLONOSCOPY WITH PROPOFOL N/A 10/23/2022   Procedure: COLONOSCOPY WITH PROPOFOL;  Surgeon: [name], MD;  Location: [facility] ENDOSCOPY;  Service: Gastroenterology;  Laterality: N/A;   ENTEROSCOPY N/A 02/27/2023   Procedure: ENTEROSCOPY;  Surgeon: Lynann Bologna, DO;  Location: [facility] ENDOSCOPY;  Service: Gastroenterology;  Laterality: N/A;   ESOPHAGOGASTRODUODENOSCOPY (EGD) WITH PROPOFOL N/A 10/23/2022   Procedure: ESOPHAGOGASTRODUODENOSCOPY (EGD) WITH PROPOFOL;  Surgeon: [name], MD;  Location: [facility] ENDOSCOPY;  Service: Gastroenterology;  Laterality: N/A;   HEMOSTASIS CLIP PLACEMENT  02/27/2023   Procedure: HEMOSTASIS CLIP PLACEMENT;  Surgeon: Lynann Bologna, DO;  Location: [facility] ENDOSCOPY;  Service:  Gastroenterology;;   IR INJECT/THERA/INC NEEDLE/CATH/PLC EPI/LUMB/SAC W/IMG  12/27/2022   LUMBAR LAMINECTOMY/DECOMPRESSION MICRODISCECTOMY N/A 01/03/2023   Procedure: Lumbar Two-Three, Lumbar Three-Four, Lumbar Four-Five Open laminectomies;  Surgeon: [name], MD;  Location: [facility] OR;  Service: Neurosurgery;  Laterality: N/A;   RADIOLOGY WITH ANESTHESIA N/A 12/27/2022   Procedure: TOTAL SPINE MYELOGRAM;  Surgeon: Radiologist, Medication, MD;  Location: [facility] OR;  Service: Radiology;  Laterality: N/A;   ROTATOR CUFF REPAIR      Family History  Problem Relation Age of Onset   Diabetes Mother    Heart attack Father 52   Hypertension Brother    Heart disease Brother    Diabetes Brother     Social History:  reports that he has quit smoking. He has never used smokeless tobacco. He reports that he does not drink alcohol and does not use drugs.  Allergies:  Allergies  Allergen Reactions   Iodine Hives, Itching and Rash         Shellfish Allergy Hives    Medications: I have reviewed the patient's current medications. Prior to Admission: (Not in a hospital admission)  Scheduled:  atorvastatin  40 mg Oral QHS   DULoxetine  30 mg Oral BID   gabapentin  300 mg Oral TID   metoprolol succinate  50 mg Oral Daily   pantoprazole  40 mg Oral BID AC   rivaroxaban  20 mg Oral q morning   traZODone  50 mg Oral QHS   Continuous: EAV:[id number] Anti-infectives (From admission, onward)    None        Results for orders  placed or performed during the hospital encounter of 08/25/23 (from the past 48 hours)  CBC with Differential/Platelet     Status: Abnormal   Collection Time: 08/25/23  4:25 AM  Result Value Ref Range   WBC 3.7 (L) 4.0 - 10.5 K/uL   RBC 3.80 (L) 4.22 - 5.81 MIL/uL   Hemoglobin 10.6 (L) 13.0 - 17.0 g/dL   HCT 16.1 (L) 09.6 - 04.5 %   MCV 87.6 80.0 - 100.0 fL   MCH 27.9 26.0 - 34.0 pg   MCHC 31.8 30.0 - 36.0 g/dL   RDW 40.9 (H) 81.1 - 91.4 %   Platelets 232 150 -  400 K/uL   nRBC 0.0 0.0 - 0.2 %   Neutrophils Relative % 48 %   Neutro Abs 1.8 1.7 - 7.7 K/uL   Lymphocytes Relative 33 %   Lymphs Abs 1.2 0.7 - 4.0 K/uL   Monocytes Relative 15 %   Monocytes Absolute 0.5 0.1 - 1.0 K/uL   Eosinophils Relative 4 %   Eosinophils Absolute 0.1 0.0 - 0.5 K/uL   Basophils Relative 0 %   Basophils Absolute 0.0 0.0 - 0.1 K/uL   Immature Granulocytes 0 %   Abs Immature Granulocytes 0.01 0.00 - 0.07 K/uL    Comment: Performed at [facility], [address] [place], [place] [address]  Comprehensive metabolic panel     Status: Abnormal   Collection Time: 08/25/23  4:25 AM  Result Value Ref Range   Sodium 136 135 - 145 mmol/L   Potassium 4.2 3.5 - 5.1 mmol/L   Chloride 100 98 - 111 mmol/L   CO2 23 22 - 32 mmol/L   Glucose, Bld 109 (H) 70 - 99 mg/dL    Comment: Glucose reference range applies only to samples taken after fasting for at least 8 hours.   BUN 11 8 - 23 mg/dL   Creatinine, Ser 6.21 0.61 - 1.24 mg/dL   Calcium 30.8 (H) 8.9 - 10.3 mg/dL   Total Protein 7.7 6.5 - 8.1 g/dL   Albumin 3.2 (L) 3.5 - 5.0 g/dL   AST 20 15 - 41 U/L   ALT 13 0 - 44 U/L   Alkaline Phosphatase 67 38 - 126 U/L   Total Bilirubin 0.5 0.0 - 1.2 mg/dL   GFR, Estimated >65 >78 mL/min    Comment: (NOTE) Calculated using the CKD-EPI Creatinine Equation (2021)    Anion gap 13 5 - 15    Comment: ELECTROLYTES REPEATED TO VERIFY Performed at [facility], [address] [place], [place] [address]   Type and screen     Status: None   Collection Time: 08/25/23  4:25 AM  Result Value Ref Range   ABO/RH(D) O POS    Antibody Screen NEG    Sample Expiration      08/28/2023,2359 Performed at [facility], [address], [place], [place] [address]   Urinalysis, w/ Reflex to Culture (Infection Suspected) -Urine, Clean Catch     Status: Abnormal   Collection Time: 08/25/23  6:37 AM  Result Value Ref Range   Specimen Source URINE, CLEAN CATCH    Color, Urine  STRAW (A) YELLOW   APPearance CLEAR CLEAR   Specific Gravity, Urine 1.005 1.005 - 1.030   pH 7.0 5.0 - 8.0   Glucose, UA NEGATIVE NEGATIVE mg/dL   Hgb urine dipstick NEGATIVE NEGATIVE   Bilirubin Urine NEGATIVE NEGATIVE   Ketones, ur NEGATIVE NEGATIVE mg/dL   Protein,  ur NEGATIVE NEGATIVE mg/dL   Nitrite NEGATIVE NEGATIVE   Leukocytes,Ua NEGATIVE NEGATIVE   RBC / HPF 0-5 0 - 5 RBC/hpf   WBC, UA 0-5 0 - 5 WBC/hpf    Comment:        Reflex urine culture not performed if WBC <=10, OR if Squamous epithelial cells >5. If Squamous epithelial cells >5 suggest recollection.    Bacteria, UA NONE SEEN NONE SEEN   Squamous Epithelial / HPF 0-5 0 - 5 /HPF    Comment: Performed at [facility], [address], [place], [place] [address]    MR LUMBAR SPINE WO CONTRAST Result Date: 08/25/2023 CLINICAL DATA:  89 year old male with low back pain. Previous surgery. EXAM: MRI LUMBAR SPINE WITHOUT CONTRAST TECHNIQUE: Multiplanar, multisequence MR imaging of the lumbar spine was performed. No intravenous contrast was administered. COMPARISON:  Intraoperative images [id number]. Previous CT lumbar myelogram 12/27/2022. FINDINGS: Segmentation:  Normal on the comparison CT. Alignment: Straightening of lower lumbar lordosis and mild focal kyphosis at L2-L3, see additional details of that level below. Vertebrae: Maintained lumbar vertebral body height. Normal background bone marrow signal. No marrow edema or evidence of acute osseous abnormality. Intact visible sacrum and SI joints. Conus medullaris and cauda equina: Conus extends to the L1-L2 level, see details below. No lower spinal cord or conus signal abnormality. Paraspinal and other soft tissues: Postoperative changes to the midline lower lumbar soft tissues from L3 through L5. Adjacent deep erector spinae muscle T2 and STIR hyperintensity with associated heterogeneous T1 hyperintensity (series 7, image 34), compatible with developing postoperative atrophy.  Similar asymmetric atrophic changes in the right psoas muscle. No convincing acute paraspinal inflammation. Partially visible distended urinary bladder. Disc levels: T11-T12: Mild disc bulging. Moderate posterior element hypertrophy. No spinal stenosis. Mild foraminal stenosis. T12-L1:  Mild far lateral disc bulging.  No stenosis. L1-L2: Circumferential disc bulge is moderate. Moderate facet and ligament flavum hypertrophy with degenerative facet joint fluid. Mild spinal stenosis in part related to epidural lipomatosis. Mild left L1 foraminal stenosis. Possible mild mass effect on the conus but no conus signal abnormality. L2-L3: Severe multifactorial spinal stenosis (series 6, image 21 and series 3, image 9) appears related to bulky circumferential disc or disc osteophyte complex and bulky facet and ligament flavum hypertrophy. Trace degenerative facet joint fluid. Trace left side synovial cyst also visible on series 6, image 20. Mild left and moderate right T2 neural foraminal stenosis. L3-L4: Previous midline decompression with no significant spinal stenosis. Mild left and moderate right L3 foraminal stenosis related to residual disc osteophyte complex and facet hypertrophy. L4-L5: Previous midline decompression with no spinal stenosis. Mild to moderate left and moderate to severe right L4 foraminal stenosis related to residual disc osteophyte complex and facet hypertrophy. L5-S1: Circumferential disc osteophyte complex and mild to moderate facet hypertrophy with no spinal stenosis. Symmetric mild lateral recess stenosis. Moderate to severe left and moderate right L5 foraminal stenosis. IMPRESSION: 1. Previous lower lumbar decompression with no residual spinal stenosis at those levels. 2. Very severe multifactorial spinal stenosis at the L2-L3 adjacent segment. Mild multifactorial spinal stenosis at L1-L2, with possible conus mass effect but no conus signal abnormality. 3. Postoperative and/or degenerative  asymmetric paraspinal muscle atrophy - including the right psoas muscle. 4. Partially visible Bladder distension.  Query urinary retention. Electronically Signed   By: Odessa Fleming M.D.   On: 08/25/2023 11:16    ROS: As above Blood pressure 120/70, pulse (!) 58, temperature 97.6 F (36.4 C), temperature  source Oral, resp. rate 18, SpO2 99%. Estimated body mass index is 33.45 kg/m as calculated from the following:   Height as of 06/11/23: 5\' 11"  (1.803 m).   Weight as of 06/14/23: 108.8 kg.  Physical Exam  General: An alert and pleasant 89 year old black male in no apparent distress  HEENT: Normocephalic  Neck: Unremarkable  Thorax: Symmetric  Abdomen: Soft  Extremities: Unremarkable  Neurologic exam.  The patient is alert and oriented x 2.  Cranial nerve exam is grossly normal.  The patient's strength is difficult to accurately assess in his lower extremities as he gives away diffusely.  He seems to have fairly normal strength except for the giveaway in his bilateral psoas and quadricep.  He has a left foot drop.  His right EHL and bilateral gastrocnemius strength is 4+/5.  I reviewed the patient's lumbar MRI performed yesterday.  The patient's had a lumbar laminectomy.  He has severe multifactorial spinal stenosis at L2-3.  Assessment/Plan: Lumbar spinal stenosis, neurogenic claudication, lumbago, lumbar radiculopathy: I discussed the situation with the patient.  I have told him he has significant narrowing at L2-3 which is likely contributing to his symptoms.  I think he would benefit from a decompressive laminectomy at L2-3 if he is medically fit.  I do not think this needs to be done urgently since this is a chronic problem and he is on Xarelto.  Perhaps the best plan would be to have him admitted by the hospitalist, stop the Xarelto and get cardiac clearance to see if he is even a candidate for surgery.  Cristi Loron 08/26/2023, 8:57 AM

## 2023-08-26 NOTE — Progress Notes (Signed)
 Physical Therapy Treatment Patient Details Name: Tristan Gardner MRN: [id number] DOB: [birth year] Today's Date: 08/26/2023   History of Present Illness Pt is an 89 y.o. male admitted to [facility] hospital on 08/25/23 for acute onset R LE weakness and associated back pain.  Pt reports he was unable to get up out of the bed.  MRI of spine complete and neurosurgery reviewed the scan and said he did not need acute surgical intervention.  Pt with significant PMH of stroke, diabetes,A-fib, CAD, HTN, NSTEMI, and lumber laminectomy, decompression, and microdiscectomy 01/2023.    PT Comments  Pt is progressing towards goals. Currently pt requires Min A for bed mobility and sit to stand. Pt was able to take 6 small side steps at EOB with Min A and assist managing AD. Pt has assistance at home from granddaughter. Due to pt current functional status, home set up and available assistance at home recommending skilled physical therapy services < 3 hours/day in order to address strength, balance and functional mobility to decrease risk for falls, injury, immobility, skin break down and re-hospitalization.      If plan is discharge home, recommend the following: A little help with walking and/or transfers;Assist for transportation;Assistance with cooking/housework;Help with stairs or ramp for entrance   Can travel by private vehicle     No  Equipment Recommendations  Wheelchair (measurements PT);Wheelchair cushion (measurements PT);Hospital bed       Precautions / Restrictions Precautions Precautions: Fall Precaution/Restrictions Comments: pt reports h/o falls     Mobility  Bed Mobility Overal bed mobility: Needs Assistance Bed Mobility: Rolling Rolling: Min assist, Used rails         General bed mobility comments: Min assist for trunk to mid line and then to scoot to edge of stretcher.    Transfers Overall transfer level: Needs assistance Equipment used: Rolling walker (2 wheels) Transfers: Sit to/from  Stand Sit to Stand: Min assist, From elevated surface           General transfer comment: Min A from stretcher with verbal cues for hand placement and increased time to get standing upright and stabilization. Pt mostly remained in flexed trunk position throughout stating he has increased pain with extension into upright posture.    Ambulation/Gait             Pre-gait activities: side steps at EOB with Min A for balance, verbal cues for sequencing and Min A for managing AD.       Balance Overall balance assessment: Needs assistance Sitting-balance support: Bilateral upper extremity supported, Feet supported Sitting balance-Leahy Scale: Fair     Standing balance support: Bilateral upper extremity supported, Reliant on assistive device for balance, During functional activity Standing balance-Leahy Scale: Poor Standing balance comment: Min A for balance especially initially at EOB        Communication Communication Communication: Impaired Factors Affecting Communication: Hearing impaired  Cognition   Behavior During Therapy: WFL for tasks assessed/performed   PT - Cognitive impairments: No apparent impairments     Following commands: Intact            General Comments General comments (skin integrity, edema, etc.): No noted skin issues. Pt was assisted with changing soiled brief. O2 sats remained in 90's throughout. HR intermittently down to 38/39 up to the low 50's      Pertinent Vitals/Pain Pain Assessment Pain Assessment: Faces Faces Pain Scale: Hurts even more Pain Location: back and L LE when it spasms. Pain Descriptors / Indicators: Grimacing, Guarding Pain  Intervention(s): Monitored during session, Limited activity within patient's tolerance     PT Goals (current goals can now be found in the care plan section) Acute Rehab PT Goals Patient Stated Goal: to get his back better and go home PT Goal Formulation: With patient Time For Goal Achievement:  09/08/23 Potential to Achieve Goals: Good Progress towards PT goals: Progressing toward goals    Frequency    Min 1X/week      PT Plan  Continue with current POC        AM-PAC PT "6 Clicks" Mobility   Outcome Measure  Help needed turning from your back to your side while in a flat bed without using bedrails?: A Little Help needed moving from lying on your back to sitting on the side of a flat bed without using bedrails?: A Little Help needed moving to and from a bed to a chair (including a wheelchair)?: A Little Help needed standing up from a chair using your arms (e.g., wheelchair or bedside chair)?: A Little Help needed to walk in hospital room?: Total Help needed climbing 3-5 steps with a railing? : Total 6 Click Score: 14    End of Session Equipment Utilized During Treatment: Gait belt Activity Tolerance: Patient tolerated treatment well;Patient limited by pain Patient left: in bed;with call bell/phone within reach Nurse Communication: Mobility status PT Visit Diagnosis: Muscle weakness (generalized) (M62.81);Difficulty in walking, not elsewhere classified (R26.2);Pain Pain - Right/Left: Right Pain - part of body: Leg     Time: 1226-1238 PT Time Calculation (min) (ACUTE ONLY): 12 min  Charges:    $Therapeutic Activity: 8-22 mins PT General Charges $$ ACUTE PT VISIT: 1 Visit                     Harrel Carina, DPT, CLT  Acute Rehabilitation Services Office: [phone] (Secure chat preferred)    Claudia Desanctis 08/26/2023, 12:59 PM

## 2023-08-26 NOTE — Progress Notes (Addendum)
 CSW spoke with patient to discuss PT recommendations for STR at SNF. Patient confirms he was living with his daughter Brennan Bailey prior to hospitalization. Patient state she is agreeable to go to rehab after discharge. Patient gave CSW permission to contact his daughter Brennan Bailey @ [phone].  CSW attempted to reach [place] without success - a voicemail was left requesting a return call.  CSW completed FL2 and faxed patient's clinicals out for review to obtain bed offers.  CSW attempted to reach [facility] social worker Manley Mason at [phone] without success - a voicemail was left requesting a return call.  Edwin Dada, MSW, LCSW Transitions of Care  Clinical Social Worker II [phone]

## 2023-08-27 MED ORDER — DIPHENHYDRAMINE HCL 25 MG PO CAPS
25.0000 mg | ORAL_CAPSULE | Freq: Once | ORAL | Status: AC
  Administered 2023-08-27: 25 mg via ORAL
  Filled 2023-08-27: qty 1

## 2023-08-27 MED ORDER — OXYCODONE-ACETAMINOPHEN 5-325 MG PO TABS: 1.0000 | ORAL_TABLET | ORAL | Status: DC | PRN

## 2023-08-27 NOTE — ED Provider Notes (Signed)
 Emergency Medicine Observation Re-evaluation Note  Azell Bill is a 89 y.o. male, seen on rounds today.  Pt initially presented to the ED for complaints of Back Pain Currently, the patient is sitting in bed in no acute distress.Marland Kitchen  Physical Exam  BP (!) 109/57   Pulse 80   Temp 98.3 F (36.8 C) (Oral)   Resp 18   SpO2 100%  Physical Exam   ED Course / MDM  EKG:EKG Interpretation Date/Time:  Saturday August 25 2023 06:39:33 EST Ventricular Rate:  38 PR Interval:    QRS Duration:  104 QT Interval:  446 QTC Calculation: 355 R Axis:   -16  Text Interpretation: Atrial flutter Borderline left axis deviation Low voltage, precordial leads Abnormal R-wave progression, early transition Minimal ST depression, inferior leads when compared to prior, slower rate with aflutter. No STEMI Confirmed by Theda Belfast ([id number]) on 08/25/2023 11:40:02 AM  I have reviewed the labs performed to date as well as medications administered while in observation.  Recent changes in the last 24 hours include hopefully patient will go home with home health services increased.  Awaiting TOC input.  Plan  Current plan is for as above.    [name], MD 08/27/23 [phone]

## 2023-08-27 NOTE — TOC Progression Note (Signed)
 Transition of Care [facility]) - Progression Note    Patient Details  Name: Maicol Bowland MRN: [id number] Date of Birth: 1935/04/04  [facility] CM/SW Contact  Carmina Krouse, LCSWA Phone Number: 08/27/2023, 6:31 PM  Clinical Narrative:     CSW attempted to reach [facility] to advise PT still recommending STR, no answer, vm left. TOC will continue to follow.        Expected Discharge Plan and Services                                               Social Determinants of Health (SDOH) Interventions SDOH Screenings   Food Insecurity: No Food Insecurity (06/11/2023)  Housing: Low Risk  (06/11/2023)  Transportation Needs: No Transportation Needs (06/11/2023)  Utilities: Not At Risk (06/11/2023)  Financial Resource Strain: Low Risk  (02/13/2023)   Received from [facility]  Physical Activity: Insufficiently Active (02/24/2020)   Received from [facility], [facility]  Social Connections: Unknown (11/03/2021)   Received from [facility], [facility]  Stress: Stress Concern Present (02/24/2020)   Received from [facility], [facility]  Tobacco Use: Medium Risk (06/11/2023)    Readmission Risk Interventions    02/28/2023   11:02 AM  Readmission Risk Prevention Plan  Transportation Screening Complete  PCP or Specialist Appt within 3-5 Days Complete  HRI or Home Care Consult Complete  Social Work Consult for Recovery Care Planning/Counseling Complete  Palliative Care Screening Not Applicable  Medication Review Oceanographer) Complete

## 2023-08-27 NOTE — Progress Notes (Signed)
 Subjective: The patient is alert and pleasant.  He is in no apparent distress.  He says he feels better today.  He wants to ambulate.  Objective: Vital signs in last 24 hours: Temp:  [97.7 F (36.5 C)-98.3 F (36.8 C)] 97.9 F (36.6 C) (02/24 0907) Pulse Rate:  [50-80] 80 (02/24 0850) Resp:  [16-20] 18 (02/24 0850) BP: (109-139)/(50-90) 109/57 (02/24 0840) SpO2:  [92 %-100 %] 100 % (02/24 0850) Estimated body mass index is 33.45 kg/m as calculated from the following:   Height as of 06/11/23: 5\' 11"  (1.803 m).   Weight as of 06/14/23: 108.8 kg.   Intake/Output from previous day: 02/23 0701 - 02/24 0700 In: -  Out: 300 [Urine:300] Intake/Output this shift: Total I/O In: -  Out: 440 [Urine:440]  Physical exam patient is alert and pleasant.  His strength is grossly normal in his lower extremities except he has a partial left foot drop without change.  Lab Results: Recent Labs    08/25/23 0425  WBC 3.7*  HGB 10.6*  HCT 33.3*  PLT 232   BMET Recent Labs    08/25/23 0425  NA 136  K 4.2  CL 100  CO2 23  GLUCOSE 109*  BUN 11  CREATININE 0.99  CALCIUM 10.6*    Studies/Results: MR LUMBAR SPINE WO CONTRAST Result Date: 08/25/2023 CLINICAL DATA:  89 year old male with low back pain. Previous surgery. EXAM: MRI LUMBAR SPINE WITHOUT CONTRAST TECHNIQUE: Multiplanar, multisequence MR imaging of the lumbar spine was performed. No intravenous contrast was administered. COMPARISON:  Intraoperative images 7324. Previous CT lumbar myelogram 12/27/2022. FINDINGS: Segmentation:  Normal on the comparison CT. Alignment: Straightening of lower lumbar lordosis and mild focal kyphosis at L2-L3, see additional details of that level below. Vertebrae: Maintained lumbar vertebral body height. Normal background bone marrow signal. No marrow edema or evidence of acute osseous abnormality. Intact visible sacrum and SI joints. Conus medullaris and cauda equina: Conus extends to the L1-L2 level, see  details below. No lower spinal cord or conus signal abnormality. Paraspinal and other soft tissues: Postoperative changes to the midline lower lumbar soft tissues from L3 through L5. Adjacent deep erector spinae muscle T2 and STIR hyperintensity with associated heterogeneous T1 hyperintensity (series 7, image 34), compatible with developing postoperative atrophy. Similar asymmetric atrophic changes in the right psoas muscle. No convincing acute paraspinal inflammation. Partially visible distended urinary bladder. Disc levels: T11-T12: Mild disc bulging. Moderate posterior element hypertrophy. No spinal stenosis. Mild foraminal stenosis. T12-L1:  Mild far lateral disc bulging.  No stenosis. L1-L2: Circumferential disc bulge is moderate. Moderate facet and ligament flavum hypertrophy with degenerative facet joint fluid. Mild spinal stenosis in part related to epidural lipomatosis. Mild left L1 foraminal stenosis. Possible mild mass effect on the conus but no conus signal abnormality. L2-L3: Severe multifactorial spinal stenosis (series 6, image 21 and series 3, image 9) appears related to bulky circumferential disc or disc osteophyte complex and bulky facet and ligament flavum hypertrophy. Trace degenerative facet joint fluid. Trace left side synovial cyst also visible on series 6, image 20. Mild left and moderate right T2 neural foraminal stenosis. L3-L4: Previous midline decompression with no significant spinal stenosis. Mild left and moderate right L3 foraminal stenosis related to residual disc osteophyte complex and facet hypertrophy. L4-L5: Previous midline decompression with no spinal stenosis. Mild to moderate left and moderate to severe right L4 foraminal stenosis related to residual disc osteophyte complex and facet hypertrophy. L5-S1: Circumferential disc osteophyte complex and mild to moderate facet  hypertrophy with no spinal stenosis. Symmetric mild lateral recess stenosis. Moderate to severe left and  moderate right L5 foraminal stenosis. IMPRESSION: 1. Previous lower lumbar decompression with no residual spinal stenosis at those levels. 2. Very severe multifactorial spinal stenosis at the L2-L3 adjacent segment. Mild multifactorial spinal stenosis at L1-L2, with possible conus mass effect but no conus signal abnormality. 3. Postoperative and/or degenerative asymmetric paraspinal muscle atrophy - including the right psoas muscle. 4. Partially visible Bladder distension.  Query urinary retention. Electronically Signed   By: Odessa Fleming M.D.   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  I have told him that this stenosis did not occur suddenly and hopefully he will return to his baseline without intervention given his age, medical problems, anticoagulation, previous surgery, etc..  If he fails to improve with time, medical management, physical therapy, etc., I think he would benefit from elective scheduled surgery in the future .  I recommend he follow-up with me in the office so we can better discuss his situation, go over surgical models and his imaging studies.  Please let me know if I can be of further assistance.  LOS: 0 days     Cristi Loron 08/27/2023, 10:45 AM     Patient ID: Tristan Gardner, male   DOB: Nov 21, 1934, 89 y.o.   MRN: [id number]

## 2023-08-27 NOTE — Progress Notes (Signed)
 Physical Therapy Treatment Patient Details Name: Tristan Gardner MRN: [id number] DOB: 1934-09-02 Today's Date: 08/27/2023   History of Present Illness Pt is an 89 y.o. male admitted to [facility] hospital on 08/25/23 for acute onset R LE weakness and associated back pain.  Pt reports he was unable to get up out of the bed.  MRI of spine complete and neurosurgery reviewed the scan and said he did not need acute surgical intervention.  Pt with significant PMH of stroke, diabetes,A-fib, CAD, HTN, NSTEMI, and lumber laminectomy, decompression, and microdiscectomy 01/2023.    PT Comments  Patient resting in bed and eager to mobilize with therapy. Pt required min assist for supine<>sit with good initiation to move LE's on/off bed and to raise trunk. Pt able to maintain seated balance at EOB with CGA. Pt required Mod assist to power up from low bed height and to weight shift anteriorly for stable static standing.  Gait training initiated with lateral steps along EOB and slightly forward/angled steps around foot of bed. As pt fatigued bil LE's flexing and buckling ultimately requiring Max assist to lower and sit EOB. Pt able to rise with min assist following rest and take side steps back to [facility]. Repeated sit<>stands completed for functional LE strengthening and whole task practice to train safe hand placement and reach back with transfers. EOS pt repositioned in supine and Alarm on and call bell within reach. Will continue to progress pt as able during acute stay. Patient will benefit from continued inpatient follow up therapy, <3 hours/day.    If plan is discharge home, recommend the following: A lot of help with walking and/or transfers;A lot of help with bathing/dressing/bathroom;Assistance with cooking/housework;Direct supervision/assist for medications management;Help with stairs or ramp for entrance;Assist for transportation   Can travel by private vehicle     No  Equipment Recommendations  Wheelchair  (measurements PT);Wheelchair cushion (measurements PT);Hospital bed    Recommendations for Other Services       Precautions / Restrictions Precautions Precautions: Fall Precaution/Restrictions Comments: pt reports h/o falls Restrictions Weight Bearing Restrictions Per Provider Order: No     Mobility  Bed Mobility Overal bed mobility: Needs Assistance Bed Mobility: Supine to Sit, Sit to Supine     Supine to sit: Min assist Sit to supine: Min assist   General bed mobility comments: Pt bringing LE's off edge, min assist for trunk. Assitst to return LE's to bed for supine.    Transfers Overall transfer level: Needs assistance Equipment used: Rolling walker (2 wheels) Transfers: Sit to/from Stand Sit to Stand: Min assist, From elevated surface, Mod assist           General transfer comment: Mod assist for rise from low bed height, min assist from elevated EOB. Cues for hand placement with RW.    Ambulation/Gait Ambulation/Gait assistance: Mod assist, Max assist Gait Distance (Feet): 8 Feet Assistive device: Rolling walker (2 wheels) Gait Pattern/deviations: Step-to pattern, Decreased stride length, Decreased dorsiflexion - right, Decreased dorsiflexion - left, Decreased weight shift to right, Knee flexed in stance - left, Knee flexed in stance - right, Knees buckling, Leaning posteriorly Gait velocity: decr     General Gait Details: mod assist for anterior weight shift for posture and balance. Mod assist to guide walker and lateral steps along EOB. Pt reports easier to step Lt vs Rt. As fatigeud bil LE's flexing Rt>Lt and knees buckling requiring max assist to control lower to EOB.   Stairs  Wheelchair Mobility     Tilt Bed    Modified Rankin (Stroke Patients Only)       Balance Overall balance assessment: Needs assistance Sitting-balance support: Bilateral upper extremity supported, Feet supported Sitting balance-Leahy Scale: Fair      Standing balance support: Bilateral upper extremity supported, Reliant on assistive device for balance, During functional activity Standing balance-Leahy Scale: Poor                              Communication Communication Communication: Impaired Factors Affecting Communication: Hearing impaired  Cognition Arousal: Alert Behavior During Therapy: WFL for tasks assessed/performed   PT - Cognitive impairments: No apparent impairments                         Following commands: Intact      Cueing Cueing Techniques: Verbal cues, Tactile cues  Exercises  10x sit<>stand from EOB, min assist fading to CGA with cues for hand placement. EOB elevated.    General Comments        Pertinent Vitals/Pain Pain Assessment Pain Assessment: 0-10 Pain Score: 6  Pain Location: back and L LE when it spasms. Pain Descriptors / Indicators: Grimacing, Guarding Pain Intervention(s): Limited activity within patient's tolerance, Monitored during session, Repositioned    Home Living                          Prior Function            PT Goals (current goals can now be found in the care plan section) Acute Rehab PT Goals Patient Stated Goal: to get his back better and go home PT Goal Formulation: With patient Time For Goal Achievement: 09/08/23 Potential to Achieve Goals: Good Progress towards PT goals: Progressing toward goals    Frequency    Min 1X/week      PT Plan      Co-evaluation              AM-PAC PT "6 Clicks" Mobility   Outcome Measure  Help needed turning from your back to your side while in a flat bed without using bedrails?: A Little Help needed moving from lying on your back to sitting on the side of a flat bed without using bedrails?: A Little Help needed moving to and from a bed to a chair (including a wheelchair)?: A Lot Help needed standing up from a chair using your arms (e.g., wheelchair or bedside chair)?: A Lot Help  needed to walk in hospital room?: A Lot Help needed climbing 3-5 steps with a railing? : Total 6 Click Score: 13    End of Session Equipment Utilized During Treatment: Gait belt Activity Tolerance: Patient tolerated treatment well;Patient limited by pain Patient left: in bed;with call bell/phone within reach Nurse Communication: Mobility status PT Visit Diagnosis: Muscle weakness (generalized) (M62.81);Difficulty in walking, not elsewhere classified (R26.2);Pain Pain - Right/Left: Right Pain - part of body: Leg     Time: 1610-9604 PT Time Calculation (min) (ACUTE ONLY): 24 min  Charges:    $Gait Training: 8-22 mins $Therapeutic Activity: 8-22 mins PT General Charges $$ ACUTE PT VISIT: 1 Visit                     Wynn Maudlin, DPT Acute Rehabilitation Services Office [phone]  08/27/23 5:36 PM

## 2023-08-27 NOTE — Progress Notes (Addendum)
 2:30pm: CSW received call from [facility] who states the [facility] therapy team is requesting be seen again for additional documentation.  12:20pm: CSW received call from [facility] at [facility] who states she send patient's clinicals out to the team for review. CSW provided [facility] with contact information for bedside RN to discuss patient if necessary.  12pm: CSW spoke with Heather at [facility] to discuss patient's need for transportation to [facility].   CSW requested information from [place] regarding patient's discharge - CSW will relay information to [place] once updates are available.  11:50am: CSW made additional attempt to reach [facility] at [facility] without success.  CSW spoke with French Ana at [facility] who states Bre is not the assigned SW to the patient. French Ana states she will locate the correct SW and will provide them with CSW contact information to follow up.  9:30am: CSW spoke with patient's granddaughter [name] to discuss bed offers. [name] states she spoke with her sister Brennan Bailey yesterday and the two of them are agreeable for patient to go to [facility] as he was there previously in December 2024. [name] agreeable for CSW to initiate insurance authorization from [facility].  CSW attempted to reach [facility] at [facility] without success - a voicemail was left requesting a return call.  8:10am: CSW attempted to reach patient's granddaughter Tianna without success - a voicemail was left requesting a return call.  Edwin Dada, MSW, LCSW Transitions of Care  Clinical Social Worker II [phone]

## 2023-08-28 NOTE — Progress Notes (Addendum)
 12:20pm: Patient will go to [facility] via [employer]. The number to call for report is [phone].  CSW spoke with patient's granddaughter [name] to inform her of discharge plan.  10:45am: CSW received call from [facility] who states she needs AVS prepared and discharge orders placed so [facility] MD can review.  10am: CSW received call from [facility] at [facility] who states patient has been approved to go to [facility] for STR.  CSW notified Nelma Rothman at [facility] [facility] of information - Nelma Rothman to notify CSW when the facility is ready to accept patient.  8:55am: CSW spoke with [place] at [facility] who states she will have team review new PT note and will call CSW back with a decision once it is available.  Edwin Dada, MSW, LCSW Transitions of Care  Clinical Social Worker II [phone]

## 2023-08-28 NOTE — Discharge Instructions (Signed)
 Follow up with your doctor in the office.

## 2023-08-28 NOTE — ED Provider Notes (Signed)
 Emergency Medicine Observation Re-evaluation Note  Tristan Gardner is a 89 y.o. male, seen on rounds today.  Pt initially presented to the ED for complaints of Back Pain Currently, the patient is resting.  Physical Exam  BP 130/65   Pulse (!) 57   Temp 98.2 F (36.8 C) (Oral)   Resp 18   SpO2 100%  Physical Exam General: nad Lungs: bilateral chest rise Psych: resting comfortably  ED Course / MDM  EKG:EKG Interpretation Date/Time:  Saturday August 25 2023 06:39:33 EST Ventricular Rate:  38 PR Interval:    QRS Duration:  104 QT Interval:  446 QTC Calculation: 355 R Axis:   -16  Text Interpretation: Atrial flutter Borderline left axis deviation Low voltage, precordial leads Abnormal R-wave progression, early transition Minimal ST depression, inferior leads when compared to prior, slower rate with aflutter. No STEMI Confirmed by Theda Belfast ([id number]) on 08/25/2023 11:40:02 AM  I have reviewed the labs performed to date as well as medications administered while in observation.  Recent changes in the last 24 hours include seen by neuro surgery.  No acute surgery planned patient cleared to go to rehab.  Plan  Current plan is for Rehab placement.  I was notified by social work that the patient is approved and ready to go.  Will put up for discharge.  Medically stable for discharge.    Melene Plan, DO 08/28/23 1120

## 2023-08-29 ENCOUNTER — Telehealth: Payer: Self-pay

## 2023-08-29 NOTE — Telephone Encounter (Signed)
 Patients appointments were rescheduled

## 2023-09-01 ENCOUNTER — Emergency Department (HOSPITAL_COMMUNITY): Payer: Medicare (Managed Care)

## 2023-09-01 ENCOUNTER — Emergency Department (HOSPITAL_COMMUNITY)
Admission: EM | Admit: 2023-09-01 | Discharge: 2023-09-02 | Disposition: A | Discharge status: Home / Self Care | Payer: Medicare (Managed Care) | Attending: Emergency Medicine | Admitting: Emergency Medicine

## 2023-09-01 ENCOUNTER — Encounter (HOSPITAL_COMMUNITY): Payer: Self-pay

## 2023-09-01 ENCOUNTER — Other Ambulatory Visit: Payer: Self-pay

## 2023-09-01 DIAGNOSIS — R079 Chest pain, unspecified: Secondary | ICD-10-CM | POA: Diagnosis present

## 2023-09-01 DIAGNOSIS — I10 Essential (primary) hypertension: Secondary | ICD-10-CM | POA: Diagnosis not present

## 2023-09-01 DIAGNOSIS — Z8673 Personal history of transient ischemic attack (TIA), and cerebral infarction without residual deficits: Secondary | ICD-10-CM | POA: Diagnosis not present

## 2023-09-01 DIAGNOSIS — I2510 Atherosclerotic heart disease of native coronary artery without angina pectoris: Secondary | ICD-10-CM | POA: Insufficient documentation

## 2023-09-01 LAB — COMPREHENSIVE METABOLIC PANEL
ALT: 15 U/L (ref 0–44)
AST: 20 U/L (ref 15–41)
Albumin: 3 g/dL — ABNORMAL LOW (ref 3.5–5.0)
Alkaline Phosphatase: 64 U/L (ref 38–126)
Anion gap: 13 (ref 5–15)
BUN: 12 mg/dL (ref 8–23)
CO2: 24 mmol/L (ref 22–32)
Calcium: 9 mg/dL (ref 8.9–10.3)
Chloride: 99 mmol/L (ref 98–111)
Creatinine, Ser: 1 mg/dL (ref 0.61–1.24)
GFR, Estimated: >60 mL/min (ref >60)
Glucose, Bld: 115 mg/dL — ABNORMAL HIGH (ref 70–99)
Potassium: 4.1 mmol/L (ref 3.5–5.1)
Sodium: 136 mmol/L (ref 135–145)
Total Bilirubin: 0.2 mg/dL (ref 0.0–1.2)
Total Protein: 7.3 g/dL (ref 6.5–8.1)

## 2023-09-01 LAB — CBC WITH DIFFERENTIAL/PLATELET
Abs Immature Granulocytes: 0.01 10*3/uL (ref 0.00–0.07)
Basophils Absolute: 0 10*3/uL (ref 0.0–0.1)
Basophils Relative: 1 %
Eosinophils Absolute: 0.1 10*3/uL (ref 0.0–0.5)
Eosinophils Relative: 2 %
HCT: 26.5 % — ABNORMAL LOW (ref 39.0–52.0)
Hemoglobin: 8.4 g/dL — ABNORMAL LOW (ref 13.0–17.0)
Immature Granulocytes: 0 %
Lymphocytes Relative: 32 %
Lymphs Abs: 1.3 10*3/uL (ref 0.7–4.0)
MCH: 27.5 pg (ref 26.0–34.0)
MCHC: 31.7 g/dL (ref 30.0–36.0)
MCV: 86.9 fL (ref 80.0–100.0)
Monocytes Absolute: 0.4 10*3/uL (ref 0.1–1.0)
Monocytes Relative: 10 %
Neutro Abs: 2.3 10*3/uL (ref 1.7–7.7)
Neutrophils Relative %: 55 %
Platelets: 234 10*3/uL (ref 150–400)
RBC: 3.05 MIL/uL — ABNORMAL LOW (ref 4.22–5.81)
RDW: 16.2 % — ABNORMAL HIGH (ref 11.5–15.5)
WBC: 4.2 10*3/uL (ref 4.0–10.5)
nRBC: 0 % (ref 0.0–0.2)

## 2023-09-01 LAB — TROPONIN I (HIGH SENSITIVITY)
Troponin I (High Sensitivity): 54 ng/L — ABNORMAL HIGH (ref <18)
Troponin I (High Sensitivity): 49 ng/L — ABNORMAL HIGH (ref <18)

## 2023-09-01 NOTE — ED Triage Notes (Signed)
 Pt bib ems from [facility] for chest pain that started around 1pm lasting about an hour with associated SOB. Pt denies any symptoms at this time. Pt given 324 ASA with ems. VSS. Pt alert.

## 2023-09-01 NOTE — ED Provider Triage Note (Signed)
 Emergency Medicine Provider Triage Evaluation Note  Tristan Gardner , a 88 y.o. male  was evaluated in triage.  Pt complains of chest pain in middle of chest at 1PM, lasted 1 hour then resolved. No current pain. Endorses SOB w/episode but no N/V.   Review of Systems  Positive: Chest pain Negative: SOB  Physical Exam  BP 101/73   Pulse (!) 58   Temp 98.7 F (37.1 C) (Oral)   Resp 18   SpO2 97%  Gen:   Awake, no distress   Resp:  Normal effort  MSK:   Moves extremities without difficulty   Medical Decision Making  Medically screening exam initiated at 5:23 PM.  Appropriate orders placed.  Tristan Gardner was informed that the remainder of the evaluation will be completed by another provider, this initial triage assessment does not replace that evaluation, and the importance of remaining in the ED until their evaluation is complete.    Tristan Gardner, [place] 09/01/23 1724

## 2023-09-01 NOTE — ED Provider Notes (Signed)
 [place] EMERGENCY DEPARTMENT AT [facility] Provider Note   CSN: [id number] Arrival date & time: 09/01/23  1714     History Chief Complaint  Patient presents with   Chest Pain    HPI Tristan Gardner is a 89 y.o. male presenting for chief complaint of chest pain.  He is a chronically ill 89 year old male status post stroke, CAD, HTN HLD.  He states he had an episode of chest pain while he was at his rehab facility personally 2 hours prior to arrival.  However by the time that I evaluated him he had had a 6-hour wait in the emergency room.  He states all of his symptoms have been resolved for hours.  He is requesting immediate discharge as he does not want to be here anymore. I called his family for further collateral.  They stated that he frequently has chest pain and usually musculoskeletal.  He follows close with cardiology in outpatient setting..   Patient's recorded medical, surgical, social, medication list and allergies were reviewed in the Snapshot window as part of the initial history.   Review of Systems   Review of Systems  Constitutional:  Negative for chills and fever.  HENT:  Negative for ear pain and sore throat.   Eyes:  Negative for pain and visual disturbance.  Respiratory:  Negative for cough and shortness of breath.   Cardiovascular:  Negative for chest pain and palpitations.  Gastrointestinal:  Negative for abdominal pain and vomiting.  Genitourinary:  Negative for dysuria and hematuria.  Musculoskeletal:  Negative for arthralgias and back pain.  Skin:  Negative for color change and rash.  Neurological:  Negative for seizures and syncope.  All other systems reviewed and are negative.   Physical Exam Updated Vital Signs BP 101/73   Pulse (!) 58   Temp 98.7 F (37.1 C) (Oral)   Resp 18   SpO2 98%  Physical Exam Vitals and nursing note reviewed.  Constitutional:      General: He is not in acute distress.    Appearance: He is well-developed.   HENT:     Head: Normocephalic and atraumatic.  Eyes:     Conjunctiva/sclera: Conjunctivae normal.  Cardiovascular:     Rate and Rhythm: Normal rate and regular rhythm.  Pulmonary:     Effort: Pulmonary effort is normal. No respiratory distress.  Abdominal:     General: Abdomen is flat. There is no distension.  Musculoskeletal:        General: No swelling or deformity.  Skin:    General: Skin is warm and dry.     Capillary Refill: Capillary refill takes less than 2 seconds.  Neurological:     Mental Status: He is alert and oriented to person, place, and time. Mental status is at baseline.      ED Course/ Medical Decision Making/ A&P    Procedures Procedures   Medications Ordered in ED Medications - No data to display  Medical Decision Making:   Chronically ill 89 year old male with acute on chronic chest pain.  Denies fevers chills nausea vomiting shortness of breath.  All the symptoms are resolved. Patient does not want any further workup in the emergency room and family is in agreement with this.  However given his high risk history, he did consent for serial troponins, screening evaluation. EKG and serial troponins are not consistent with any acute pathology very consistent with his prior labs.  No evidence of pneumothorax or pneumonia on his chest x-ray.  No evidence clinically of aortic dissection or pulmonary embolism based on his clinical condition today. Patient has been ambulatory and is in no acute distress requesting discharge back to the facility. Family is in agreement with this.   Clinical Impression: No diagnosis found.   Data Unavailable   Final Clinical Impression(s) / ED Diagnoses Final diagnoses:  None    Rx / DC Orders ED Discharge Orders     None         Glyn Ade, MD 09/01/23 2328

## 2023-09-01 NOTE — ED Notes (Addendum)
 Waiting for PTAR. Contacted by Diplomatic Services operational officer. No ETA provided.

## 2023-09-09 NOTE — Progress Notes (Unsigned)
 [facility] CONSULT NOTE  Patient Care Team: [name], MD as PCP - General (Internal Medicine) [name], MD as PCP - Electrophysiology (Cardiology) [name], MD as PCP - Cardiology (Cardiology)  ASSESSMENT & PLAN:  89 y.o.male with history of atrial fibrillation, GIB, CAD, stroke, neuropathy, anemia of chronic disease, b12 deficiency, iron deficiency anemia here for follow up.  B12 was 167 in Nov. Ferritn was 26.  Received monoferric on 06/07/23.  Previously he was on both antiplatelet and anticoagulation.  Currently report he is not on any antiplatelet or anticoagulation.  Relevant history: History of both antiplatelet and anticoagulation. GIB Last colonoscopy: 2024  Last EGD: 2024  Repeat labs today showed iron deficiency again. History of GIB. Recommend follow up with GI for evaluation.  Left upper quadrant abdominal pain Assessment & Plan: Persistent Unclear etiology Will obtain US for evaluation.  Orders: -     US Abdomen Complete; Future  Low serum vitamin B12 Assessment & Plan: Lab drawn today showed improvement Will continue b12 1000 mcg daily  Orders: -     Vitamin B12; Future -     Folate; Future  Iron deficiency anemia due to chronic blood loss -     CBC with Differential (Cancer Center Only); Future -     Ferritin; Future  Folate deficiency Assessment & Plan: Folate improved on oral  Continue folic acid daily   Other orders -     ferric derisomaltose (MONOFERRIC) 1,000 mg in sodium chloride 0.9 % 100 mL infusion -     Alteplase -     Heparin Sod (Pork) Lock Flush -     sodium chloride flush -     sodium chloride flush -     Heparin Sod (Pork) Lock Flush -     Sodium Chloride -     Sodium Chloride -     diphenhydrAMINE HCl -     Albuterol Sulfate HFA -     Famotidine in NaCl -     methylPREDNISolone Sodium Succ -     EPINEPHrine  All questions were answered. The patient knows to call the clinic with any  problems, questions or concerns.  [name], MD 3/11/20258:47 AM   CHIEF COMPLAINTS/PURPOSE OF CONSULTATION:  Anemia  HISTORY OF PRESENTING ILLNESS:  Tristan Gardner 89 y.o. male is here for follow up with granddaughter because of anemia. He tolerated iv iron Monoferric. Stool is dark black daily. No red blood. No bloody urine.  Not sure if he is taking b12. He is in reheb. Report he coundn't get out of bed a few weeks ago. Report he is not on blood thinner.   Previous Hematology hx:  He has history of atrial fibrillation on chronic anticoagulation.  02/2023 he presented with symptomatic anemia, syncope, hemoglobin was 6.4.  Reported previously was also on Plavix for history of stroke.  Ferritin was 8 in August 2024 with microcytic anemia.  B12 was low.  10/23/22 colonoscopy The perianal and digital rectal examinations were normal. A large amount of liquid semi- liquid stool was found in the entire colon, interfering with visualization. Small, medium, and flat lesions could not be excluded due to the remaining stool. A single medium- sized angiodysplastic lesion without bleeding was found in the proximal ascending colon. The exam was otherwise without abnormality on direct and retroflexion views.  EGD - Normal esophagus. - Non- bleeding gastric ulcers with no stigmata of bleeding. Biopsied. - Gastropathy and gastric erosions. - Normal examined  duodenum.  02/27/23 Small bowel enteroscopy Red blood was found in the cardia. The entire examined stomach was normal. A single angiodysplastic lesion with no bleeding was found in the third portion of the duodenum. For hemostasis, one hemostatic clip was successfully placed. There was no bleeding at the end of the procedure. There was no evidence of significant pathology at 140 cm ( from the incisors)  He was on plavix until discontinued when saw cardiologist.  MEDICAL HISTORY:  Past Medical History:  Diagnosis Date   Atrial fibrillation  (HCC)    Coronary artery disease    Diabetes mellitus without complication (HCC)    Hypertension    NSTEMI (non-ST elevated myocardial infarction) (HCC)    Stroke [facility])     SURGICAL HISTORY: Past Surgical History:  Procedure Laterality Date   BIOPSY  10/23/2022   Procedure: BIOPSY;  Surgeon: [name], MD;  Location: [facility] ENDOSCOPY;  Service: Gastroenterology;;   CARDIAC SURGERY     COLONOSCOPY WITH PROPOFOL N/A 10/23/2022   Procedure: COLONOSCOPY WITH PROPOFOL;  Surgeon: [name], MD;  Location: [facility] ENDOSCOPY;  Service: Gastroenterology;  Laterality: N/A;   ENTEROSCOPY N/A 02/27/2023   Procedure: ENTEROSCOPY;  Surgeon: Lynann Bologna, DO;  Location: [facility] ENDOSCOPY;  Service: Gastroenterology;  Laterality: N/A;   ESOPHAGOGASTRODUODENOSCOPY (EGD) WITH PROPOFOL N/A 10/23/2022   Procedure: ESOPHAGOGASTRODUODENOSCOPY (EGD) WITH PROPOFOL;  Surgeon: [name], MD;  Location: [facility] ENDOSCOPY;  Service: Gastroenterology;  Laterality: N/A;   HEMOSTASIS CLIP PLACEMENT  02/27/2023   Procedure: HEMOSTASIS CLIP PLACEMENT;  Surgeon: Lynann Bologna, DO;  Location: [facility] ENDOSCOPY;  Service: Gastroenterology;;   IR INJECT/THERA/INC NEEDLE/CATH/PLC EPI/LUMB/SAC W/IMG  12/27/2022   LUMBAR LAMINECTOMY/DECOMPRESSION MICRODISCECTOMY N/A 01/03/2023   Procedure: Lumbar Two-Three, Lumbar Three-Four, Lumbar Four-Five Open laminectomies;  Surgeon: [name], MD;  Location: [facility] OR;  Service: Neurosurgery;  Laterality: N/A;   RADIOLOGY WITH ANESTHESIA N/A 12/27/2022   Procedure: TOTAL SPINE MYELOGRAM;  Surgeon: Radiologist, Medication, MD;  Location: [facility] OR;  Service: Radiology;  Laterality: N/A;   ROTATOR CUFF REPAIR     ALLERGIES:  is allergic to iodine and shellfish allergy.  MEDICATIONS:  Current Outpatient Medications  Medication Sig Dispense Refill   acetaminophen (TYLENOL) 325 MG tablet Take 2 tablets (650 mg total) by mouth every 6 (six) hours as needed for mild pain (or Fever >/=  101).     albuterol (VENTOLIN HFA) 108 (90 Base) MCG/ACT inhaler Inhale 2 puffs into the lungs every 4 (four) hours as needed for wheezing or shortness of breath.     atorvastatin (LIPITOR) 40 MG tablet Take 40 mg by mouth at bedtime.     baclofen (LIORESAL) 10 MG tablet Take 15 mg by mouth 3 (three) times daily.     cyanocobalamin (VITAMIN B12) 1000 MCG tablet Take 1,000 mcg by mouth daily.     diclofenac Sodium (VOLTAREN) 1 % GEL Apply 4 g topically 4 (four) times daily. (Patient not taking: Reported on 08/26/2023)     DULoxetine (CYMBALTA) 30 MG capsule Take 30 mg by mouth in the morning.     ferrous sulfate 325 (65 FE) MG tablet Take 325 mg by mouth daily with breakfast.     folic acid (FOLVITE) 1 MG tablet Take 1 mg by mouth daily.     gabapentin (NEURONTIN) 300 MG capsule Take 300 mg by mouth 3 (three) times daily.     metoprolol succinate (TOPROL-XL) 50 MG 24 hr tablet Take 50 mg by mouth daily.     pantoprazole (  PROTONIX) 40 MG tablet Take 1 tablet (40 mg total) by mouth 2 (two) times daily before a meal. 120 tablet 0   XARELTO 20 MG TABS tablet Take 1 tablet (20 mg total) by mouth every morning. START ON 7/10 (Patient taking differently: Take 20 mg by mouth daily with supper.) 30 tablet    No current facility-administered medications for this visit.    REVIEW OF SYSTEMS:   All relevant systems were reviewed with the patient and are negative.  PHYSICAL EXAMINATION:  Vitals:   09/10/23 1407  BP: 116/68  Pulse: (!) 58  Resp: 18  Temp: (!) 97.5 F (36.4 C)  SpO2: 98%    Filed Weights   09/10/23 1407  Weight: 248 lb 9.6 oz (112.8 kg)   GENERAL: alert, no distress and comfortable on wheelchair  SKIN: skin color normal LUNGS: normal breathing effort  RADIOGRAPHIC STUDIES: I have personally reviewed the radiological images as listed and agreed with the findings in the report. DG Chest 1 View Result Date: 09/01/2023 CLINICAL DATA:  Chest pain EXAM: CHEST  1 VIEW COMPARISON:   06/11/2023 FINDINGS: Mild prominence of the cardiac silhouette and pulmonary vasculature are likely due to expiratory phase of imaging. Lungs are clear. IMPRESSION: No acute cardiopulmonary process. Exam limited due to low depth of inspiration. Electronically Signed   By: Acquanetta Belling M.D.   On: 09/01/2023 19:55   MR LUMBAR SPINE WO CONTRAST Result Date: 08/25/2023 CLINICAL DATA:  89 year old male with low back pain. Previous surgery. EXAM: MRI LUMBAR SPINE WITHOUT CONTRAST TECHNIQUE: Multiplanar, multisequence MR imaging of the lumbar spine was performed. No intravenous contrast was administered. COMPARISON:  Intraoperative images [id number]. Previous CT lumbar myelogram 12/27/2022. FINDINGS: Segmentation:  Normal on the comparison CT. Alignment: Straightening of lower lumbar lordosis and mild focal kyphosis at L2-L3, see additional details of that level below. Vertebrae: Maintained lumbar vertebral body height. Normal background bone marrow signal. No marrow edema or evidence of acute osseous abnormality. Intact visible sacrum and SI joints. Conus medullaris and cauda equina: Conus extends to the L1-L2 level, see details below. No lower spinal cord or conus signal abnormality. Paraspinal and other soft tissues: Postoperative changes to the midline lower lumbar soft tissues from L3 through L5. Adjacent deep erector spinae muscle T2 and STIR hyperintensity with associated heterogeneous T1 hyperintensity (series 7, image 34), compatible with developing postoperative atrophy. Similar asymmetric atrophic changes in the right psoas muscle. No convincing acute paraspinal inflammation. Partially visible distended urinary bladder. Disc levels: T11-T12: Mild disc bulging. Moderate posterior element hypertrophy. No spinal stenosis. Mild foraminal stenosis. T12-L1:  Mild far lateral disc bulging.  No stenosis. L1-L2: Circumferential disc bulge is moderate. Moderate facet and ligament flavum hypertrophy with degenerative facet  joint fluid. Mild spinal stenosis in part related to epidural lipomatosis. Mild left L1 foraminal stenosis. Possible mild mass effect on the conus but no conus signal abnormality. L2-L3: Severe multifactorial spinal stenosis (series 6, image 21 and series 3, image 9) appears related to bulky circumferential disc or disc osteophyte complex and bulky facet and ligament flavum hypertrophy. Trace degenerative facet joint fluid. Trace left side synovial cyst also visible on series 6, image 20. Mild left and moderate right T2 neural foraminal stenosis. L3-L4: Previous midline decompression with no significant spinal stenosis. Mild left and moderate right L3 foraminal stenosis related to residual disc osteophyte complex and facet hypertrophy. L4-L5: Previous midline decompression with no spinal stenosis. Mild to moderate left and moderate to severe right L4 foraminal stenosis related  to residual disc osteophyte complex and facet hypertrophy. L5-S1: Circumferential disc osteophyte complex and mild to moderate facet hypertrophy with no spinal stenosis. Symmetric mild lateral recess stenosis. Moderate to severe left and moderate right L5 foraminal stenosis. IMPRESSION: 1. Previous lower lumbar decompression with no residual spinal stenosis at those levels. 2. Very severe multifactorial spinal stenosis at the L2-L3 adjacent segment. Mild multifactorial spinal stenosis at L1-L2, with possible conus mass effect but no conus signal abnormality. 3. Postoperative and/or degenerative asymmetric paraspinal muscle atrophy - including the right psoas muscle. 4. Partially visible Bladder distension.  Query urinary retention. Electronically Signed   By: Odessa Fleming M.D.   On: 08/25/2023 11:16

## 2023-09-10 ENCOUNTER — Other Ambulatory Visit: Payer: Self-pay

## 2023-09-10 ENCOUNTER — Inpatient Hospital Stay: Payer: Medicare (Managed Care)

## 2023-09-10 ENCOUNTER — Inpatient Hospital Stay (HOSPITAL_BASED_OUTPATIENT_CLINIC_OR_DEPARTMENT_OTHER): Payer: Medicare (Managed Care)

## 2023-09-10 VITALS — BP 116/68 | HR 58 | Temp 97.5°F | Resp 18 | Ht 71.0 in | Wt 248.6 lb

## 2023-09-10 DIAGNOSIS — Z7902 Long term (current) use of antithrombotics/antiplatelets: Secondary | ICD-10-CM | POA: Insufficient documentation

## 2023-09-10 DIAGNOSIS — D500 Iron deficiency anemia secondary to blood loss (chronic): Secondary | ICD-10-CM

## 2023-09-10 DIAGNOSIS — R1012 Left upper quadrant pain: Secondary | ICD-10-CM | POA: Insufficient documentation

## 2023-09-10 DIAGNOSIS — I4891 Unspecified atrial fibrillation: Secondary | ICD-10-CM | POA: Diagnosis not present

## 2023-09-10 DIAGNOSIS — Z7901 Long term (current) use of anticoagulants: Secondary | ICD-10-CM | POA: Insufficient documentation

## 2023-09-10 DIAGNOSIS — E538 Deficiency of other specified B group vitamins: Secondary | ICD-10-CM | POA: Diagnosis not present

## 2023-09-10 DIAGNOSIS — Z8673 Personal history of transient ischemic attack (TIA), and cerebral infarction without residual deficits: Secondary | ICD-10-CM | POA: Diagnosis not present

## 2023-09-10 LAB — CBC WITH DIFFERENTIAL (CANCER CENTER ONLY)
Abs Immature Granulocytes: 0.02 10*3/uL (ref 0.00–0.07)
Basophils Absolute: 0 10*3/uL (ref 0.0–0.1)
Basophils Relative: 1 %
Eosinophils Absolute: 0.1 10*3/uL (ref 0.0–0.5)
Eosinophils Relative: 3 %
HCT: 24.1 % — ABNORMAL LOW (ref 39.0–52.0)
Hemoglobin: 7.7 g/dL — ABNORMAL LOW (ref 13.0–17.0)
Immature Granulocytes: 1 %
Lymphocytes Relative: 26 %
Lymphs Abs: 1.1 10*3/uL (ref 0.7–4.0)
MCH: 28.2 pg (ref 26.0–34.0)
MCHC: 32 g/dL (ref 30.0–36.0)
MCV: 88.3 fL (ref 80.0–100.0)
Monocytes Absolute: 0.5 10*3/uL (ref 0.1–1.0)
Monocytes Relative: 12 %
Neutro Abs: 2.6 10*3/uL (ref 1.7–7.7)
Neutrophils Relative %: 57 %
Platelet Count: 214 10*3/uL (ref 150–400)
RBC: 2.73 MIL/uL — ABNORMAL LOW (ref 4.22–5.81)
RDW: 18.6 % — ABNORMAL HIGH (ref 11.5–15.5)
WBC Count: 4.3 10*3/uL (ref 4.0–10.5)
nRBC: 0 % (ref 0.0–0.2)

## 2023-09-10 LAB — FERRITIN: Ferritin: 14 ng/mL — ABNORMAL LOW (ref 24–336)

## 2023-09-10 LAB — FOLATE: Folate: 20.3 ng/mL (ref >5.9)

## 2023-09-10 LAB — VITAMIN B12: Vitamin B-12: 853 pg/mL (ref 180–914)

## 2023-09-10 NOTE — Assessment & Plan Note (Signed)
 Persistent Unclear etiology Will obtain US for evaluation.

## 2023-09-10 NOTE — Assessment & Plan Note (Signed)
 Lab drawn today showed improvement Will continue b12 1000 mcg daily

## 2023-09-11 ENCOUNTER — Telehealth: Payer: Self-pay

## 2023-09-11 NOTE — Assessment & Plan Note (Signed)
 Folate improved on oral  Continue folic acid daily

## 2023-09-11 NOTE — Telephone Encounter (Signed)
-----   Message from [name] sent at 09/11/2023  8:39 AM EDT ----- Angelica Chessman please let granddaughter know the iron is low again. I am ordering iv iron. She should have him see GI again for evaluation for GIB. Thanks.

## 2023-09-11 NOTE — Telephone Encounter (Signed)
 TC to granddaughter [name] to inform of the below message by Dr. Cherly Hensen. She knows to be expecting a call from [facility] to set up IV iron appt for pt, and she verbalizes understanding that pt needs to see GI to r/o GI bleed.

## 2023-09-21 ENCOUNTER — Ambulatory Visit (HOSPITAL_COMMUNITY)
Admission: RE | Admit: 2023-09-21 | Discharge: 2023-09-21 | Disposition: A | Discharge status: Home / Self Care | Payer: Medicare (Managed Care) | Source: Ambulatory Visit

## 2023-09-21 ENCOUNTER — Encounter (HOSPITAL_COMMUNITY): Payer: Self-pay

## 2023-09-21 DIAGNOSIS — R1012 Left upper quadrant pain: Secondary | ICD-10-CM | POA: Insufficient documentation

## 2023-09-26 ENCOUNTER — Ambulatory Visit (HOSPITAL_COMMUNITY)
Admission: RE | Admit: 2023-09-26 | Discharge: 2023-09-26 | Disposition: A | Discharge status: Home / Self Care | Payer: Medicare (Managed Care) | Source: Ambulatory Visit

## 2023-09-26 DIAGNOSIS — R1012 Left upper quadrant pain: Secondary | ICD-10-CM | POA: Diagnosis present

## 2023-09-27 ENCOUNTER — Telehealth: Payer: Self-pay

## 2023-09-27 NOTE — Progress Notes (Deleted)
 Electrophysiology Office Note:   Date:  09/27/2023  ID:  Tristan Gardner, DOB [birth year], MRN [id number]  Primary Cardiologist: [name], MD Electrophysiologist: [name], MD  {Click to update primary MD,subspecialty MD or APP then REFRESH:1}    History of Present Illness:   Tristan Gardner is a 89 y.o. male with h/o permanent atrial fibrillation/flutter, chronic blood loss anemia with iron deficiency, h/o stroke who is being seen today for Watchman evaluation at the request of Dr. Rosemary Holms.  Discussed the use of AI scribe software for clinical note transcription with the patient, who gave verbal consent to proceed.  History of Present Illness     Review of systems complete and found to be negative unless listed in HPI.   EP Information / Studies Reviewed:    EKG is not ordered today. EKG from 09/01/23 reviewed which showed AFL.      EKG 08/25/23: AFL   Nuclear Stress 05/30/23:    The study is normal. The study is low risk.   No ST deviation was noted.   Left ventricular function is normal. End diastolic cavity size is normal.   Prior study not available for comparison.  Echo 12/25/22:  1. Left ventricular ejection fraction, by estimation, is 50 to 55%. The  left ventricle has low normal function. The left ventricle has no regional  wall motion abnormalities. There is moderate concentric left ventricular  hypertrophy. Left ventricular  diastolic function could not be evaluated.   2. Right ventricular systolic function was not well visualized. The right  ventricular size is not well visualized. There is normal pulmonary artery  systolic pressure.   3. Left atrial size was mild to moderately dilated.   4. The mitral valve is normal in structure. Mild mitral valve  regurgitation. No evidence of mitral stenosis.   5. The aortic valve is tricuspid. There is mild calcification of the  aortic valve. There is mild thickening of the aortic valve. Aortic valve   regurgitation is not visualized.   6. Aortic dilatation noted. There is mild dilatation of the ascending  aorta, measuring 40 mm.   7. The inferior vena cava is normal in size with greater than 50%  respiratory variability, suggesting right atrial pressure of 3 mmHg.   Risk Assessment/Calculations:    CHA2DS2-VASc Score = 5  {Confirm score is correct.  If not, click here to update score.  REFRESH note.  :1} This indicates a 7.2% annual risk of stroke. The patient's score is based upon: CHF History: 0 HTN History: 1 Diabetes History: 0 Stroke History: 2 Vascular Disease History: 0 Age Score: 2 Gender Score: 0   {This patient has a significant risk of stroke if diagnosed with atrial fibrillation.  Please consider VKA or DOAC agent for anticoagulation if the bleeding risk is acceptable.   You can also use the SmartPhrase .[email or web address] for documentation.   :[id number]} No BP recorded.  {Refresh Note OR Click here to enter BP  :1}***        Physical Exam:   VS:  There were no vitals taken for this visit.   Wt Readings from Last 3 Encounters:  09/10/23 248 lb 9.6 oz (112.8 kg)  06/14/23 239 lb 13.8 oz (108.8 kg)  06/07/23 248 lb 3.2 oz (112.6 kg)     GEN: Well nourished, well developed in no acute distress NECK: No JVD CARDIAC: {EPRHYTHM:28826}, no murmurs, rubs, gallops RESPIRATORY:  Clear to auscultation without rales, wheezing or rhonchi  ABDOMEN: Soft, non-distended EXTREMITIES:  No edema; No deformity   ASSESSMENT AND PLAN:   I have seen Tristan Gardner in the office today who is being considered for a Watchman left atrial appendage closure device. I believe they will benefit from this procedure given their history of atrial fibrillation, CHA2DS2-VASc score of 5 and unadjusted ischemic stroke rate of 7.2% per year. Unfortunately, the patient is not felt to be a long term anticoagulation candidate secondary to ***. The patient's chart has been reviewed and I feel that they would  be a candidate for short term oral anticoagulation after Watchman implant.   It is my belief that after undergoing a LAA closure procedure, Tristan Gardner will not need long term anticoagulation which eliminates anticoagulation side effects and major bleeding risk.   Procedural risks for the Watchman implant have been reviewed with the patient including a 0.5% risk of stroke, <1% risk of perforation and <1% risk of device embolization. Other risks include bleeding, vascular damage, tamponade, worsening renal function, and death. The patient understands these risk and wishes to proceed.     The published clinical data on the safety and effectiveness of WATCHMAN include but are not limited to the following: - Holmes DR, Everlene Farrier, Sick P et al. for the PROTECT AF Investigators. Percutaneous closure of the left atrial appendage versus warfarin therapy for prevention of stroke in patients with atrial fibrillation: a randomised non-inferiority trial. Lancet 2009; 374: 534-42. Everlene Farrier, Doshi SK, Isa Rankin D et al. on behalf of the PROTECT AF Investigators. Percutaneous Left Atrial Appendage Closure for Stroke Prophylaxis in Patients With Atrial Fibrillation 2.3-Year Follow-up of the PROTECT AF (Watchman Left Atrial Appendage System for Embolic Protection in Patients With Atrial Fibrillation) Trial. Circulation 2013; 127:720-729. - Alli O, Doshi S,  Kar S, Reddy VY, Sievert H et al. Quality of Life Assessment in the Randomized PROTECT AF (Percutaneous Closure of the Left Atrial Appendage Versus Warfarin Therapy for Prevention of Stroke in Patients With Atrial Fibrillation) Trial of Patients at Risk for Stroke With Nonvalvular Atrial Fibrillation. J Am Coll Cardiol 2013; 61:1790-8. Aline August DR, Mia Creek, Price M, Whisenant B, Sievert H, Doshi S, Huber K, Reddy V. Prospective randomized evaluation of the Watchman left atrial appendage Device in patients with atrial fibrillation versus long-term warfarin  therapy; the PREVAIL trial. Journal of the [employer] of Cardiology, Vol. 4, No. 1, 2014, 1-11. - Kar S, Doshi SK, Sadhu A, Horton R, Osorio J et al. Primary outcome evaluation of a next-generation left atrial appendage closure device: results from the PINNACLE FLX trial. Circulation 2021;143(18)1754-1762.    After today's visit with the patient which was dedicated solely for shared decision making visit regarding LAA closure device, the patient decided to proceed with the LAA appendage closure procedure scheduled to be done in the near future at [facility]. Prior to the procedure, I would like to obtain a gated CT scan of the chest with contrast timed for PV/LA visualization.   Additionally, the patient will need ***.  HAS-BLED score *** Hypertension Yes  Abnormal renal and liver function (Dialysis, transplant, Cr >2.26 mg/dL /Cirrhosis or Bilirubin >2x Normal or AST/ALT/AP >3x Normal) {YES/NO:21197} Stroke Yes  Bleeding {YES/NO:21197} Labile INR (Unstable/high INR) No  Elderly (>65) Yes  Drugs or alcohol (>= 8 drinks/week, anti-plt or NSAID) No   CHA2DS2-VASc Score = 5  The patient's score is based upon: CHF History: 0 HTN History: 1 Diabetes History: 0 Stroke History: 2 Vascular Disease History: 0 Age Score:  2 Gender Score: 0   {Confirm score is correct.  If not, click here to update score.  REFRESH note.  :1}    ASSESSMENT AND PLAN: Permanent Atrial Fibrillation/Flutter The patient's CHA2DS2-VASc score is 5, indicating a 7.2% annual risk of stroke.  ***  Secondary Hypercoagulable State (ICD10:  D68.69){Click to add to Prob List or Visit Dx  :[id number]} The patient is at significant risk for stroke/thromboembolism based upon his CHA2DS2-VASc Score of 5.  Continue Rivaroxaban (Xarelto).    Follow up with {YQMVH:84696} {EPFOLLOW EX:52841}  Signed, [name], MD

## 2023-09-27 NOTE — Telephone Encounter (Signed)
-----   Message from [name] sent at 09/27/2023  1:05 PM EDT ----- Please let granddaughter know ultrasound was normal.  If he still has persistent abdominal pain, recommend follow-up with PCP for evaluation thank you.

## 2023-09-27 NOTE — Telephone Encounter (Signed)
 TC to pt's granddaugher to inform of the below message by Dr. Cherly Hensen. She verbalizes understanding.

## 2023-09-28 ENCOUNTER — Ambulatory Visit: Payer: Medicare (Managed Care) | Attending: Internal Medicine | Admitting: Cardiology

## 2023-10-01 ENCOUNTER — Emergency Department (HOSPITAL_COMMUNITY)
Admission: EM | Admit: 2023-10-01 | Discharge: 2023-10-02 | Disposition: A | Discharge status: Home / Self Care | Payer: Medicare (Managed Care) | Attending: Emergency Medicine | Admitting: Emergency Medicine

## 2023-10-01 ENCOUNTER — Emergency Department (HOSPITAL_COMMUNITY): Payer: Medicare (Managed Care)

## 2023-10-01 ENCOUNTER — Encounter: Payer: Self-pay | Admitting: Cardiology

## 2023-10-01 ENCOUNTER — Other Ambulatory Visit: Payer: Self-pay

## 2023-10-01 ENCOUNTER — Encounter (HOSPITAL_COMMUNITY): Payer: Self-pay

## 2023-10-01 DIAGNOSIS — R000 Tachycardia, unspecified: Secondary | ICD-10-CM | POA: Diagnosis not present

## 2023-10-01 DIAGNOSIS — I4891 Unspecified atrial fibrillation: Secondary | ICD-10-CM | POA: Diagnosis not present

## 2023-10-01 DIAGNOSIS — I6501 Occlusion and stenosis of right vertebral artery: Secondary | ICD-10-CM | POA: Diagnosis not present

## 2023-10-01 DIAGNOSIS — R29898 Other symptoms and signs involving the musculoskeletal system: Secondary | ICD-10-CM

## 2023-10-01 DIAGNOSIS — I700 Atherosclerosis of aorta: Secondary | ICD-10-CM | POA: Insufficient documentation

## 2023-10-01 DIAGNOSIS — I6782 Cerebral ischemia: Secondary | ICD-10-CM | POA: Diagnosis not present

## 2023-10-01 DIAGNOSIS — R531 Weakness: Secondary | ICD-10-CM | POA: Diagnosis present

## 2023-10-01 DIAGNOSIS — Z7409 Other reduced mobility: Secondary | ICD-10-CM | POA: Insufficient documentation

## 2023-10-01 DIAGNOSIS — I10 Essential (primary) hypertension: Secondary | ICD-10-CM | POA: Insufficient documentation

## 2023-10-01 DIAGNOSIS — R262 Difficulty in walking, not elsewhere classified: Secondary | ICD-10-CM | POA: Insufficient documentation

## 2023-10-01 DIAGNOSIS — R600 Localized edema: Secondary | ICD-10-CM | POA: Diagnosis not present

## 2023-10-01 DIAGNOSIS — Z8673 Personal history of transient ischemic attack (TIA), and cerebral infarction without residual deficits: Secondary | ICD-10-CM | POA: Insufficient documentation

## 2023-10-01 DIAGNOSIS — I6523 Occlusion and stenosis of bilateral carotid arteries: Secondary | ICD-10-CM | POA: Diagnosis not present

## 2023-10-01 DIAGNOSIS — Z7901 Long term (current) use of anticoagulants: Secondary | ICD-10-CM | POA: Insufficient documentation

## 2023-10-01 DIAGNOSIS — Z87891 Personal history of nicotine dependence: Secondary | ICD-10-CM | POA: Diagnosis not present

## 2023-10-01 DIAGNOSIS — E119 Type 2 diabetes mellitus without complications: Secondary | ICD-10-CM | POA: Insufficient documentation

## 2023-10-01 DIAGNOSIS — R200 Anesthesia of skin: Secondary | ICD-10-CM | POA: Diagnosis not present

## 2023-10-01 DIAGNOSIS — M6281 Muscle weakness (generalized): Secondary | ICD-10-CM | POA: Diagnosis not present

## 2023-10-01 DIAGNOSIS — M79605 Pain in left leg: Secondary | ICD-10-CM

## 2023-10-01 LAB — I-STAT CHEM 8, ED
BUN: 8 mg/dL (ref 8–23)
Calcium, Ion: 1.18 mmol/L (ref 1.15–1.40)
Chloride: 102 mmol/L (ref 98–111)
Creatinine, Ser: 1.2 mg/dL (ref 0.61–1.24)
Glucose, Bld: 103 mg/dL — ABNORMAL HIGH (ref 70–99)
HCT: 29 % — ABNORMAL LOW (ref 39.0–52.0)
Hemoglobin: 9.9 g/dL — ABNORMAL LOW (ref 13.0–17.0)
Potassium: 4.8 mmol/L (ref 3.5–5.1)
Sodium: 138 mmol/L (ref 135–145)
TCO2: 26 mmol/L (ref 22–32)

## 2023-10-01 LAB — CBC
HCT: 26.7 % — ABNORMAL LOW (ref 39.0–52.0)
Hemoglobin: 8 g/dL — ABNORMAL LOW (ref 13.0–17.0)
MCH: 26.7 pg (ref 26.0–34.0)
MCHC: 30 g/dL (ref 30.0–36.0)
MCV: 89 fL (ref 80.0–100.0)
Platelets: 259 10*3/uL (ref 150–400)
RBC: 3 MIL/uL — ABNORMAL LOW (ref 4.22–5.81)
RDW: 18.1 % — ABNORMAL HIGH (ref 11.5–15.5)
WBC: 3.8 10*3/uL — ABNORMAL LOW (ref 4.0–10.5)
nRBC: 0 % (ref 0.0–0.2)

## 2023-10-01 LAB — COMPREHENSIVE METABOLIC PANEL WITH GFR
ALT: 18 U/L (ref 0–44)
AST: 28 U/L (ref 15–41)
Albumin: 3 g/dL — ABNORMAL LOW (ref 3.5–5.0)
Alkaline Phosphatase: 68 U/L (ref 38–126)
Anion gap: 11 (ref 5–15)
BUN: 8 mg/dL (ref 8–23)
CO2: 20 mmol/L — ABNORMAL LOW (ref 22–32)
Calcium: 8.5 mg/dL — ABNORMAL LOW (ref 8.9–10.3)
Chloride: 103 mmol/L (ref 98–111)
Creatinine, Ser: 0.98 mg/dL (ref 0.61–1.24)
GFR, Estimated: >60 mL/min (ref >60)
Glucose, Bld: 96 mg/dL (ref 70–99)
Potassium: 3.9 mmol/L (ref 3.5–5.1)
Sodium: 134 mmol/L — ABNORMAL LOW (ref 135–145)
Total Bilirubin: 0.3 mg/dL (ref 0.0–1.2)
Total Protein: 7.1 g/dL (ref 6.5–8.1)

## 2023-10-01 LAB — DIFFERENTIAL
Abs Immature Granulocytes: 0.01 10*3/uL (ref 0.00–0.07)
Basophils Absolute: 0 10*3/uL (ref 0.0–0.1)
Basophils Relative: 1 %
Eosinophils Absolute: 0.1 10*3/uL (ref 0.0–0.5)
Eosinophils Relative: 2 %
Immature Granulocytes: 0 %
Lymphocytes Relative: 37 %
Lymphs Abs: 1.4 10*3/uL (ref 0.7–4.0)
Monocytes Absolute: 0.9 10*3/uL (ref 0.1–1.0)
Monocytes Relative: 23 %
Neutro Abs: 1.5 10*3/uL — ABNORMAL LOW (ref 1.7–7.7)
Neutrophils Relative %: 37 %

## 2023-10-01 LAB — PROTIME-INR
INR: 1.3 — ABNORMAL HIGH (ref 0.8–1.2)
Prothrombin Time: 16.1 s — ABNORMAL HIGH (ref 11.4–15.2)

## 2023-10-01 LAB — APTT: aPTT: 37 s — ABNORMAL HIGH (ref 24–36)

## 2023-10-01 LAB — ETHANOL: Alcohol, Ethyl (B): <10 mg/dL (ref <10)

## 2023-10-01 LAB — CBG MONITORING, ED: Glucose-Capillary: 103 mg/dL — ABNORMAL HIGH (ref 70–99)

## 2023-10-01 MED ORDER — SODIUM CHLORIDE 0.9% FLUSH: 3.0000 mL | Freq: Once | INTRAVENOUS | Status: DC

## 2023-10-01 MED ORDER — DIPHENHYDRAMINE HCL 50 MG/ML IJ SOLN
INTRAMUSCULAR | Status: AC
  Filled 2023-10-01: qty 1

## 2023-10-01 MED ORDER — DIPHENHYDRAMINE HCL 50 MG/ML IJ SOLN
50.0000 mg | Freq: Once | INTRAMUSCULAR | Status: AC
  Administered 2023-10-01: 50 mg via INTRAVENOUS

## 2023-10-01 MED ORDER — METHYLPREDNISOLONE SODIUM SUCC 125 MG IJ SOLR
INTRAMUSCULAR | Status: AC
  Filled 2023-10-01: qty 2

## 2023-10-01 MED ORDER — DIAZEPAM 5 MG/ML IJ SOLN
2.5000 mg | Freq: Once | INTRAMUSCULAR | Status: AC
  Administered 2023-10-01: 2.5 mg via INTRAVENOUS
  Filled 2023-10-01: qty 2

## 2023-10-01 MED ORDER — IOHEXOL 350 MG/ML SOLN
100.0000 mL | Freq: Once | INTRAVENOUS | Status: AC | PRN
  Administered 2023-10-01: 100 mL via INTRAVENOUS

## 2023-10-01 MED ORDER — METHYLPREDNISOLONE SODIUM SUCC 125 MG IJ SOLR
125.0000 mg | Freq: Once | INTRAMUSCULAR | Status: AC
  Administered 2023-10-01: 125 mg via INTRAVENOUS

## 2023-10-01 MED ORDER — CYCLOBENZAPRINE HCL 10 MG PO TABS: 5.0000 mg | ORAL_TABLET | Freq: Three times a day (TID) | ORAL | Status: DC | PRN

## 2023-10-01 NOTE — Consult Note (Addendum)
 NEUROLOGY CONSULT NOTE   Date of service: October 01, 2023 Patient Name: Tristan Gardner MRN:  [id number] DOB:  [birth year] Chief Complaint: "Stroke alert" Requesting Provider: Rondel Baton, MD  History of Present Illness  Tristan Gardner is a 89 y.o. male with PMHx significant for A-fib on Xarelto, T2DM, HTN, and stroke who presents with left lower extremity weakness and numbness. Patient was in his usual state of health until 4Pm this afternoon when he developed sudden RLE weakness and numbness. He usually uses a wheelchair to get around due to chronic back pain and previous stroke deficits. After 4PM, he had difficulty transitioning out of his wheelchair which is new for him. He takes Xarelto for a-fib, last dose was last night per grand daughter who administers patient's meds. Patient denies any other new or worsening neurological deficits. BP on arrival 140/72.  LKW: 3:59PM  Modified rankin score: 4-Needs assistance to walk and tend to bodily needs IV Thrombolysis: No (used anticoagulation within the last 48 hrs) EVT:  NO (no LVO) ICH Score: NA  NIHSS components Score: Comment  1a Level of Conscious 0[x]  1[]  2[]  3[]      1b LOC Questions 0[]  1[x]  2[]       1c LOC Commands 0[x]  1[]  2[]       2 Best Gaze 0[x]  1[]  2[]       3 Visual 0[x]  1[]  2[]  3[]      4 Facial Palsy 0[x]  1[]  2[]  3[]      5a Motor Arm - left 0[x]  1[]  2[]  3[]  4[]  UN[]    5b Motor Arm - Right 0[x]  1[]  2[]  3[]  4[]  UN[]    6a Motor Leg - Left 0[]  1[]  2[]  3[x]  4[]  UN[]    6b Motor Leg - Right 0[x]  1[]  2[]  3[]  4[]  UN[]    7 Limb Ataxia 0[x]  1[]  2[]  3[]  UN[]     8 Sensory 0[]  1[x]  2[]  UN[]      9 Best Language 0[x]  1[]  2[]  3[]      10 Dysarthria 0[x]  1[]  2[]  UN[]      11 Extinct. and Inattention 0[x]  1[]  2[]       TOTAL: 5      ROS  Comprehensive ROS performed and pertinent positives documented in HPI   Past History   Past Medical History:  Diagnosis Date   Atrial fibrillation (HCC)    Coronary artery disease    Diabetes  mellitus without complication (HCC)    Hypertension    NSTEMI (non-ST elevated myocardial infarction) (HCC)    Stroke [facility])     Past Surgical History:  Procedure Laterality Date   BIOPSY  10/23/2022   Procedure: BIOPSY;  Surgeon: [name], MD;  Location: [facility] ENDOSCOPY;  Service: Gastroenterology;;   CARDIAC SURGERY     COLONOSCOPY WITH PROPOFOL N/A 10/23/2022   Procedure: COLONOSCOPY WITH PROPOFOL;  Surgeon: [name], MD;  Location: [facility] ENDOSCOPY;  Service: Gastroenterology;  Laterality: N/A;   ENTEROSCOPY N/A 02/27/2023   Procedure: ENTEROSCOPY;  Surgeon: Lynann Bologna, DO;  Location: [facility] ENDOSCOPY;  Service: Gastroenterology;  Laterality: N/A;   ESOPHAGOGASTRODUODENOSCOPY (EGD) WITH PROPOFOL N/A 10/23/2022   Procedure: ESOPHAGOGASTRODUODENOSCOPY (EGD) WITH PROPOFOL;  Surgeon: [name], MD;  Location: [facility] ENDOSCOPY;  Service: Gastroenterology;  Laterality: N/A;   HEMOSTASIS CLIP PLACEMENT  02/27/2023   Procedure: HEMOSTASIS CLIP PLACEMENT;  Surgeon: Lynann Bologna, DO;  Location: [facility] ENDOSCOPY;  Service: Gastroenterology;;   IR INJECT/THERA/INC NEEDLE/CATH/PLC EPI/LUMB/SAC W/IMG  12/27/2022   LUMBAR LAMINECTOMY/DECOMPRESSION MICRODISCECTOMY N/A 01/03/2023   Procedure: Lumbar Two-Three, Lumbar Three-Four, Lumbar Four-Five Open laminectomies;  Surgeon: [name], MD;  Location: [facility] OR;  Service: Neurosurgery;  Laterality: N/A;   RADIOLOGY WITH ANESTHESIA N/A 12/27/2022   Procedure: TOTAL SPINE MYELOGRAM;  Surgeon: Radiologist, Medication, MD;  Location: [facility] OR;  Service: Radiology;  Laterality: N/A;   ROTATOR CUFF REPAIR      Family History: Family History  Problem Relation Age of Onset   Diabetes Mother    Heart attack Father 79   Hypertension Brother    Heart disease Brother    Diabetes Brother     Social History  reports that he has quit smoking. He has never used smokeless tobacco. He reports that he does not drink alcohol and does not use  drugs.  Allergies  Allergen Reactions   Iodine Hives, Itching and Rash         Shellfish Allergy Hives    Medications   Current Facility-Administered Medications:    diphenhydrAMINE (BENADRYL) injection 50 mg, 50 mg, Intravenous, Once, Tristan Szymborski, MD   methylPREDNISolone sodium succinate (SOLU-MEDROL) 125 mg/2 mL injection 125 mg, 125 mg, Intravenous, Once, Tristan Aguado, MD   sodium chloride flush (NS) 0.9 % injection 3 mL, 3 mL, Intravenous, Once, Rondel Baton, MD  Current Outpatient Medications:    acetaminophen (TYLENOL) 325 MG tablet, Take 2 tablets (650 mg total) by mouth every 6 (six) hours as needed for mild pain (or Fever >/= 101)., Disp: , Rfl:    albuterol (VENTOLIN HFA) 108 (90 Base) MCG/ACT inhaler, Inhale 2 puffs into the lungs every 4 (four) hours as needed for wheezing or shortness of breath., Disp: , Rfl:    atorvastatin (LIPITOR) 40 MG tablet, Take 40 mg by mouth at bedtime., Disp: , Rfl:    baclofen (LIORESAL) 10 MG tablet, Take 15 mg by mouth 3 (three) times daily., Disp: , Rfl:    cyanocobalamin (VITAMIN B12) 1000 MCG tablet, Take 1,000 mcg by mouth daily., Disp: , Rfl:    diclofenac Sodium (VOLTAREN) 1 % GEL, Apply 4 g topically 4 (four) times daily. (Patient not taking: Reported on 08/26/2023), Disp: , Rfl:    DULoxetine (CYMBALTA) 30 MG capsule, Take 30 mg by mouth in the morning., Disp: , Rfl:    ferrous sulfate 325 (65 FE) MG tablet, Take 325 mg by mouth daily with breakfast., Disp: , Rfl:    folic acid (FOLVITE) 1 MG tablet, Take 1 mg by mouth daily., Disp: , Rfl:    gabapentin (NEURONTIN) 300 MG capsule, Take 300 mg by mouth 3 (three) times daily., Disp: , Rfl:    metoprolol succinate (TOPROL-XL) 50 MG 24 hr tablet, Take 50 mg by mouth daily., Disp: , Rfl:    pantoprazole (PROTONIX) 40 MG tablet, Take 1 tablet (40 mg total) by mouth 2 (two) times daily before a meal., Disp: 120 tablet, Rfl: 0   XARELTO 20 MG TABS tablet, Take 1 tablet (20  mg total) by mouth every morning. START ON 7/10 (Patient taking differently: Take 20 mg by mouth daily with supper.), Disp: 30 tablet, Rfl:   Vitals   Vitals:   10/01/23 1827  Weight: 114.2 kg    Body mass index is 35.11 kg/m.  Physical Exam   Constitutional: Appears well-developed and well-nourished.  Psych: Affect appropriate to situation.  Eyes: No scleral injection.  HENT: No OP obstruction.  Head: Normocephalic.  Cardiovascular: Normal rate and regular rhythm.  Respiratory: Effort normal, non-labored breathing.  GI: Soft.  No distension. There is no tenderness.  Skin: WDI.   Neurologic  Examination   Mental Status -  Level of arousal and orientation to place, and person were intact. Language including expression, naming, repetition, comprehension was assessed and found intact. Attention span and concentration were normal. Recent and remote memory were intact. Fund of Knowledge was assessed and was intact.  Cranial Nerves II - XII - II - Visual field intact OU. III, IV, VI - Extraocular movements intact. V - Facial sensation intact bilaterally. VII - Facial movement intact bilaterally. VIII - Hearing & vestibular intact bilaterally. X - Palate elevates symmetrically. XI - Chin turning & shoulder shrug intact bilaterally. XII - Tongue protrusion intact.  Motor Strength - LLE with increased tone and 1/5 strength initially, pain limited, but later improved to 3/5 with improvement in pain    Motor Tone - increased tone in LLE Muscle tone   Reflexes - 1+ in RUE, LUE, RLE, spastic LLE  Sensory - diminished to Light touch, temperature/pinprick in LLE below the hip.     Coordination - The patient had normal movements in the hands and feet with no ataxia or dysmetria.  Tremor was absent.  Gait and Station - deferred.   Labs/Imaging/Neurodiagnostic studies   CBC:  Recent Labs  Lab 2023/10/18 1840  HGB 9.9*  HCT 29.0*   Basic Metabolic Panel:  Lab Results   Component Value Date   NA 138 Oct 18, 2023   K 4.8 October 18, 2023   CO2 24 09/01/2023   GLUCOSE 103 (H) 18-Oct-2023   BUN 8 10/18/23   CREATININE 1.20 10-18-2023   CALCIUM 9.0 09/01/2023   GFRNONAA >60 09/01/2023   GFRAA >60 03/12/2020   Lipid Panel:  Lab Results  Component Value Date   LDLCALC 46 05/21/2023   HgbA1c:  Lab Results  Component Value Date   HGBA1C 5.3 05/21/2023   Urine Drug Screen: No results found for: "LABOPIA", "COCAINSCRNUR", "LABBENZ", "AMPHETMU", "THCU", "LABBARB"  Alcohol Level No results found for: "ETH" INR  Lab Results  Component Value Date   INR 1.1 05/20/2023   APTT  Lab Results  Component Value Date   APTT 31 03/11/2020   AED levels: No results found for: "PHENYTOIN", "ZONISAMIDE", "LAMOTRIGINE", "LEVETIRACETA"  CT Head without contrast(Personally reviewed): 1. No CT evidence of acute intracranial abnormality. 2. Similar appearance of parenchymal volume loss and chronic microvascular ischemic changes. 3. ASPECTS is 10  CT angio Head and Neck with contrast(Personally reviewed): 1. No emergent large vessel occlusion. 2. 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 3. Motion degraded CT perfusion with 18 mL of mismatch volume, unreliable.  MRI Brain Pending   ASSESSMENT   Keenon Leitzel is a 89 y.o. male with PMHx significant for A-fib on Xarelto, T2DM, HTN, and stroke who presents with left lower extremity weakness and numbness. Not a candidate for TNK due to last AC use within 48 hrs, not a candidate for thrombectomy due to no LVO. Patient's LLE weakness appears to be pain related with increased tone and spacticity with straight leg sign. We will get MRI brain wo to rule out stroke, add MRI T and L spine wo to rule out structural etiologies for new LLE weakness, pain, and increased tone, recommend flexeril 4 mg TID PRN for muscle spasm and pain.   RECOMMENDATIONS  Admit for further workup MRI brain wo MRI T spine wo MRI L  spinie wo Trial Flexeril 5mg  TID PRN for LLE pain and spasm, if not effective, reasonable to switch to Baclofen PT ______________________________________________________________________    Forest Gleason, MD [facility]

## 2023-10-01 NOTE — ED Triage Notes (Addendum)
 Pt arrives via EMS from home. Code stroke activated in field. Pt and family reported numbness and tingling to left arm and left leg as well as a slight left sided facial droop. No facial droop noted. He does have decreased sensation and weakness to left leg. Pt is on xarelto, last dose was taken last night. LKW 1600.

## 2023-10-01 NOTE — ED Notes (Signed)
 Patient transported to MRI

## 2023-10-01 NOTE — ED Provider Notes (Signed)
 [place] EMERGENCY DEPARTMENT AT [facility] Provider Note   CSN: [id number] Arrival date & time: 10/01/23  1816  An emergency department physician performed an initial assessment on this suspected stroke patient at 1814.  History  Chief Complaint  Patient presents with   Code Stroke    Tristan Gardner is a 89 y.o. male.  89 year old male with history of atrial fibrillation on Xarelto, diabetes, hypertension, and stroke who presents to the emergency department with left lower extremity numbness.  4 PM today patient says that he start experiencing left lower extremity numbness and weakness when he attempted to stand.  Says that he has had this happen on the right side but not the left side before.  No bowel or bladder incontinence.  Still having lumbar spine pain.  Has a mild headache as well.  Reported that he may have had some weakness there too.  He is on Xarelto with last dose being last night.       Home Medications Prior to Admission medications   Medication Sig Start Date End Date Taking? Authorizing Provider  acetaminophen (TYLENOL) 325 MG tablet Take 2 tablets (650 mg total) by mouth every 6 (six) hours as needed for mild pain (or Fever >/= 101). 10/24/22   Elgergawy, [name], MD  albuterol (VENTOLIN HFA) 108 (90 Base) MCG/ACT inhaler Inhale 2 puffs into the lungs every 4 (four) hours as needed for wheezing or shortness of breath. 02/24/20   [provider]  atorvastatin (LIPITOR) 40 MG tablet Take 40 mg by mouth at bedtime. 08/24/22   [provider]  baclofen (LIORESAL) 10 MG tablet Take 15 mg by mouth 3 (three) times daily.    [provider]  cyanocobalamin (VITAMIN B12) 1000 MCG tablet Take 1,000 mcg by mouth daily.    [provider]  diclofenac Sodium (VOLTAREN) 1 % GEL Apply 4 g topically 4 (four) times daily. Patient not taking: Reported on 08/26/2023 06/14/23   Dorcas Carrow, MD  DULoxetine (CYMBALTA) 30 MG capsule Take 30  mg by mouth in the morning.    [provider]  ferrous sulfate 325 (65 FE) MG tablet Take 325 mg by mouth daily with breakfast.    [provider]  folic acid (FOLVITE) 1 MG tablet Take 1 mg by mouth daily.    [provider]  gabapentin (NEURONTIN) 300 MG capsule Take 300 mg by mouth 3 (three) times daily.    [provider]  metoprolol succinate (TOPROL-XL) 50 MG 24 hr tablet Take 50 mg by mouth daily. 08/24/22   [provider]  pantoprazole (PROTONIX) 40 MG tablet Take 1 tablet (40 mg total) by mouth 2 (two) times daily before a meal. 03/02/23 08/26/23  Lewie Chamber, MD  XARELTO 20 MG TABS tablet Take 1 tablet (20 mg total) by mouth every morning. START ON 7/10 Patient taking differently: Take 20 mg by mouth daily with supper. 01/10/23   Ghimire, Werner Lean, MD      Allergies    Iodine and Shellfish allergy    Review of Systems   Review of Systems  Physical Exam Updated Vital Signs BP (!) 170/103   Pulse 93   Temp 97.9 F (36.6 C) (Oral)   Resp 11   Wt 114.2 kg   SpO2 100%   BMI 35.11 kg/m  Physical Exam Vitals and nursing note reviewed.  Constitutional:      General: He is not in acute distress.    Appearance: He is  well-developed.  HENT:     Head: Normocephalic and atraumatic.     Right Ear: External ear normal.     Left Ear: External ear normal.     Nose: Nose normal.  Eyes:     Extraocular Movements: Extraocular movements intact.     Conjunctiva/sclera: Conjunctivae normal.     Pupils: Pupils are equal, round, and reactive to light.  Cardiovascular:     Rate and Rhythm: Normal rate and regular rhythm.     Heart sounds: Normal heart sounds.  Pulmonary:     Effort: Pulmonary effort is normal. No respiratory distress.     Breath sounds: Normal breath sounds.  Musculoskeletal:     Cervical back: Normal range of motion and neck supple.     Right lower leg: Edema present.     Left lower leg: Edema present.     Comments:  Spinal midline tenderness to palpation at approximately L3-L4.  Prior surgical scars from lumbar spinal fusion noted.  Motor: Muscle bulk and tone are normal.  Full strength in right lower extremity.  Left lower extremity exam limited due to pain but is unable to lift against gravity.  Sensory: Diminished sensation in left lower extremity in L2-S1 distribution  Reflexes: Patellar 1+ bilaterally, Achilles 1+ bilaterally, no ankle clonus bilaterally  Skin:    General: Skin is warm and dry.  Neurological:     Mental Status: He is alert. Mental status is at baseline.  Psychiatric:        Mood and Affect: Mood normal.        Behavior: Behavior normal.     ED Results / Procedures / Treatments   Labs (all labs ordered are listed, but only abnormal results are displayed) Labs Reviewed  PROTIME-INR - Abnormal; Notable for the following components:      Result Value   Prothrombin Time 16.1 (*)    INR 1.3 (*)    All other components within normal limits  APTT - Abnormal; Notable for the following components:   aPTT 37 (*)    All other components within normal limits  CBC - Abnormal; Notable for the following components:   WBC 3.8 (*)    RBC 3.00 (*)    Hemoglobin 8.0 (*)    HCT 26.7 (*)    RDW 18.1 (*)    All other components within normal limits  DIFFERENTIAL - Abnormal; Notable for the following components:   Neutro Abs 1.5 (*)    All other components within normal limits  COMPREHENSIVE METABOLIC PANEL WITH GFR - Abnormal; Notable for the following components:   Sodium 134 (*)    CO2 20 (*)    Calcium 8.5 (*)    Albumin 3.0 (*)    All other components within normal limits  I-STAT CHEM 8, ED - Abnormal; Notable for the following components:   Glucose, Bld 103 (*)    Hemoglobin 9.9 (*)    HCT 29.0 (*)    All other components within normal limits  CBG MONITORING, ED - Abnormal; Notable for the following components:   Glucose-Capillary 103 (*)    All other components within  normal limits  ETHANOL    EKG EKG Interpretation Date/Time:  Monday October 01 2023 19:06:52 EDT Ventricular Rate:  105 PR Interval:  144 QRS Duration:  99 QT Interval:  358 QTC Calculation: 474 R Axis:   2  Text Interpretation: Atrial fibrillation RSR' in V1 or V2, right VCD or RVH Confirmed by Vonita Moss [phone])  on 10/01/2023 7:19:34 PM  Radiology MR LUMBAR SPINE WO CONTRAST Result Date: 10/01/2023 CLINICAL DATA:  Spinal cord injury follow-up EXAM: MRI THORACIC AND LUMBAR SPINE WITHOUT CONTRAST TECHNIQUE: Multiplanar and multiecho pulse sequences of the thoracic and lumbar spine were obtained without intravenous contrast. COMPARISON:  08/25/2023 lumbar spine MRI FINDINGS: MRI THORACIC SPINE FINDINGS Alignment:  Physiologic. Vertebrae: No fracture, evidence of discitis, or bone lesion. Cord:  Normal signal and morphology. Paraspinal and other soft tissues: Negative. Disc levels: No spinal canal or neural foraminal stenosis. MRI LUMBAR SPINE FINDINGS Segmentation:  Standard. Alignment:  Physiologic. Vertebrae:  No fracture, evidence of discitis, or bone lesion. Conus medullaris and cauda equina: Conus extends to the L1 level. Conus and cauda equina appear normal. Paraspinal and other soft tissues: Negative Disc levels: Axial images are markedly motion degraded. L1-L2: Unchanged small disc bulge. Mild spinal canal stenosis. No neural foraminal stenosis. L2-L3: Unchanged intermediate sized disc bulge with moderate facet hypertrophy. Severe spinal canal stenosis. Moderate right neural foraminal stenosis. L3-L4: Small disc bulge with bilateral laminectomies. Unchanged right lateral recess narrowing. No central spinal canal stenosis. Moderate right neural foraminal stenosis. L4-L5: Small disc bulge with bilateral laminectomies. Narrowing of both lateral recesses without central spinal canal stenosis. Mild bilateral neural foraminal stenosis. L5-S1: Left asymmetric disc bulge with endplate spurring.  Left lateral recess narrowing without central spinal canal stenosis. Moderate right and severe left neural foraminal stenosis. Visualized sacrum: Normal. IMPRESSION: 1. No acute abnormality of the thoracic or lumbar spine. 2. Unchanged severe spinal canal stenosis at L2-L3 and mild spinal canal stenosis at L1-L2. 3. Unchanged moderate right L2-L3, L3-L4 and L5-S1 neural foraminal stenosis. 4. Unchanged narrowing of the lateral recesses at L3-L4, L4-L5 and L5-S1. Electronically Signed   By: Deatra Robinson M.D.   On: 10/01/2023 23:18   MR THORACIC SPINE WO CONTRAST Result Date: 10/01/2023 CLINICAL DATA:  Spinal cord injury follow-up EXAM: MRI THORACIC AND LUMBAR SPINE WITHOUT CONTRAST TECHNIQUE: Multiplanar and multiecho pulse sequences of the thoracic and lumbar spine were obtained without intravenous contrast. COMPARISON:  08/25/2023 lumbar spine MRI FINDINGS: MRI THORACIC SPINE FINDINGS Alignment:  Physiologic. Vertebrae: No fracture, evidence of discitis, or bone lesion. Cord:  Normal signal and morphology. Paraspinal and other soft tissues: Negative. Disc levels: No spinal canal or neural foraminal stenosis. MRI LUMBAR SPINE FINDINGS Segmentation:  Standard. Alignment:  Physiologic. Vertebrae:  No fracture, evidence of discitis, or bone lesion. Conus medullaris and cauda equina: Conus extends to the L1 level. Conus and cauda equina appear normal. Paraspinal and other soft tissues: Negative Disc levels: Axial images are markedly motion degraded. L1-L2: Unchanged small disc bulge. Mild spinal canal stenosis. No neural foraminal stenosis. L2-L3: Unchanged intermediate sized disc bulge with moderate facet hypertrophy. Severe spinal canal stenosis. Moderate right neural foraminal stenosis. L3-L4: Small disc bulge with bilateral laminectomies. Unchanged right lateral recess narrowing. No central spinal canal stenosis. Moderate right neural foraminal stenosis. L4-L5: Small disc bulge with bilateral laminectomies.  Narrowing of both lateral recesses without central spinal canal stenosis. Mild bilateral neural foraminal stenosis. L5-S1: Left asymmetric disc bulge with endplate spurring. Left lateral recess narrowing without central spinal canal stenosis. Moderate right and severe left neural foraminal stenosis. Visualized sacrum: Normal. IMPRESSION: 1. No acute abnormality of the thoracic or lumbar spine. 2. Unchanged severe spinal canal stenosis at L2-L3 and mild spinal canal stenosis at L1-L2. 3. Unchanged moderate right L2-L3, L3-L4 and L5-S1 neural foraminal stenosis. 4. Unchanged narrowing of the lateral recesses at L3-L4, L4-L5 and L5-S1.  Electronically Signed   By: Deatra Robinson M.D.   On: 10/01/2023 23:18   MR BRAIN WO CONTRAST Result Date: 10/01/2023 CLINICAL DATA:  Left lower extremity weakness and numbness EXAM: MRI HEAD WITHOUT CONTRAST TECHNIQUE: Multiplanar, multiecho pulse sequences of the brain and surrounding structures were obtained without intravenous contrast. COMPARISON:  None Available. FINDINGS: Brain: No acute infarct, mass effect or extra-axial collection. No acute or chronic hemorrhage. Normal white matter signal. Generalized volume loss. The midline structures are normal. Vascular: Normal flow voids. Skull and upper cervical spine: Normal calvarium and skull base. Visualized upper cervical spine and soft tissues are normal. Sinuses/Orbits:No paranasal sinus fluid levels or advanced mucosal thickening. No mastoid or middle ear effusion. Right scleral band. IMPRESSION: 1. No acute intracranial abnormality. 2. Generalized volume loss. Electronically Signed   By: Deatra Robinson M.D.   On: 10/01/2023 23:07   CT ANGIO HEAD NECK W WO CM W PERF (CODE STROKE) Result Date: 10/01/2023 CLINICAL DATA:  Acute neurologic deficit EXAM: CT ANGIOGRAPHY HEAD AND NECK CT PERFUSION BRAIN TECHNIQUE: Multidetector CT imaging of the head and neck was performed using the standard protocol during bolus administration of  intravenous contrast. Multiplanar CT image reconstructions and MIPs were obtained to evaluate the vascular anatomy. Carotid stenosis measurements (when applicable) are obtained utilizing NASCET criteria, using the distal internal carotid diameter as the denominator. Multiphase CT imaging of the brain was performed following IV bolus contrast injection. Subsequent parametric perfusion maps were calculated using RAPID software. RADIATION DOSE REDUCTION: This exam was performed according to the departmental dose-optimization program which includes automated exposure control, adjustment of the mA and/or kV according to patient size and/or use of iterative reconstruction technique. CONTRAST:  OMNIPAQUE IOHEXOL 350 MG/ML SOLN COMPARISON:  Head CT 10/01/2023 FINDINGS: CTA NECK FINDINGS Skeleton: No acute abnormality or high grade bony spinal canal stenosis. Other neck: Normal pharynx, larynx and major salivary glands. No cervical lymphadenopathy. Unremarkable thyroid gland. Upper chest: No pneumothorax or pleural effusion. No nodules or masses. Aortic arch: There is calcific atherosclerosis of the aortic arch. Conventional 3 vessel aortic branching pattern. RIGHT carotid system: No dissection, occlusion or aneurysm. Mild atherosclerotic calcification at the carotid bifurcation without hemodynamically significant stenosis. LEFT carotid system: No dissection, occlusion or aneurysm. Mild atherosclerotic calcification at the carotid bifurcation without hemodynamically significant stenosis. Vertebral arteries: Codominant configuration. 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 Both vertebral arteries are otherwise normal to the skull base. CTA HEAD FINDINGS Intracranial imaging is motion degraded. POSTERIOR CIRCULATION: Vertebral arteries are normal. No proximal occlusion of the anterior or inferior cerebellar arteries. Basilar artery is normal. Superior cerebellar arteries are normal. Posterior cerebral  arteries are normal. ANTERIOR CIRCULATION: Intracranial internal carotid arteries are normal. Anterior cerebral arteries are normal. Middle cerebral arteries are normal. Venous sinuses: As permitted by contrast timing, patent. Anatomic variants: Both P comms are patent. Review of the MIP images confirms the above findings. CT Brain Perfusion Findings: CBF (<30%) Volume: 0mL Perfusion (Tmax>6.0s) volume: 18mL Mismatch Volume: 18mL The examination is markedly degraded by motion IMPRESSION: 1. No emergent large vessel occlusion. 2. 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 3. Motion degraded CT perfusion with 18 mL of mismatch volume, unreliable. Electronically Signed   By: Deatra Robinson M.D.   On: 10/01/2023 19:12   CT HEAD CODE STROKE WO CONTRAST Result Date: 10/01/2023 CLINICAL DATA:  Code stroke.  Code stroke, neuro deficit. EXAM: CT HEAD WITHOUT CONTRAST TECHNIQUE: Contiguous axial images were obtained from the base  of the skull through the vertex without intravenous contrast. RADIATION DOSE REDUCTION: This exam was performed according to the departmental dose-optimization program which includes automated exposure control, adjustment of the mA and/or kV according to patient size and/or use of iterative reconstruction technique. COMPARISON:  CT head 06/11/2023. FINDINGS: Brain: No acute intracranial hemorrhage. No CT evidence of acute infarct. Similar degree of generalized parenchymal volume loss without lobar specific atrophy. Nonspecific hypoattenuation in the periventricular and subcortical white matter favored to reflect chronic microvascular ischemic changes. No edema, mass effect, or midline shift. The basilar cisterns are patent. Ventricles: Prominence of the ventricles suggesting underlying parenchymal volume loss. Vascular: Atherosclerotic calcifications of the carotid siphons and intracranial vertebral arteries. No hyperdense vessel. Skull: No acute or aggressive lesion. Similar appearance  of multiple metallic foreign bodies within the scalp. Orbits: Right scleral buckle.  Orbits otherwise unremarkable. Sinuses: Focal mucous retention cyst in the left maxillary sinus. Other: Mastoid air cells are clear. ASPECTS [facility] Stroke Program Early CT Score) - Ganglionic level infarction (caudate, lentiform nuclei, internal capsule, insula, M1-M3 cortex): 7 - Supraganglionic infarction (M4-M6 cortex): 3 Total score (0-10 with 10 being normal): 10 IMPRESSION: 1. No CT evidence of acute intracranial abnormality. 2. Similar appearance of parenchymal volume loss and chronic microvascular ischemic changes. 3. ASPECTS is 10 These results were called by telephone at the time of interpretation on 10/01/2023 at 6:43 pm to provider Dr. Armanda Magic, who verbally acknowledged these results. Electronically Signed   By: Emily Filbert M.D.   On: 10/01/2023 18:46    Procedures Procedures    Medications Ordered in ED Medications  sodium chloride flush (NS) 0.9 % injection 3 mL (3 mLs Intravenous Not Given 10/01/23 1917)  methylPREDNISolone sodium succinate (SOLU-MEDROL) 125 mg/2 mL injection 125 mg (125 mg Intravenous Given 10/01/23 1848)  diphenhydrAMINE (BENADRYL) injection 50 mg (50 mg Intravenous Given 10/01/23 1848)  iohexol (OMNIPAQUE) 350 MG/ML injection 100 mL (100 mLs Intravenous Contrast Given 10/01/23 1856)  diazepam (VALIUM) injection 2.5 mg (2.5 mg Intravenous Given 10/01/23 2033)    ED Course/ Medical Decision Making/ A&P Clinical Course as of 10/02/23 0059  Mon Oct 01, 2023  1930 Dr Armanda Magic from neurology has evaluated the patient.  Felt that it is likely pain is limiting his movement rather than a stroke.  With his anticoagulation no indication for TNK. [RP]  2057 on the bladder scan  [RP]    Clinical Course User Index [RP] Rondel Baton, MD                                 Medical Decision Making Amount and/or Complexity of Data Reviewed Labs: ordered. Radiology:  ordered.  Risk Prescription drug management.   Oshea Percival is a 89 y.o. male with comorbidities that complicate the patient evaluation including atrial fibrillation on Xarelto, diabetes, hypertension, and stroke who presents to the emergency department with left lower extremity numbness.   Initial Ddx:  Stroke, ICH, lumbar radiculopathy, generalized deconditioning, spinal cord compression, cauda equina  MDM/Course:  Patient presents emergency department with left lower extremity weakness.  Initially was a code stroke due to last known well 4 PM.  Was complaining of lots of pain in his leg as well.  CT head without acute abnormality.  MRI of the brain, thoracic spine, and lumbar spine was ordered at the recommendation of neurology.  Does not show acute abnormality that would explain his symptoms or require emergent  intervention.  Suspect his symptoms are due to lumbar radiculopathy and generalized deconditioning.  Upon re-evaluation patient unable to ambulate with a walker.  Did discuss with his granddaughter who is his caregiver.  The patient stays at home with her and her husband and in his current state they are not able to take care of at home.  Feel that he would benefit from physical therapy evaluation to see if he needs to go back to rehab.  This patient presents to the ED for concern of complaints listed in HPI, this involves an extensive number of treatment options, and is a complaint that carries with it a high risk of complications and morbidity. Disposition including potential need for admission considered.   Dispo: TOC boarder  Additional history obtained from family Records reviewed Outpatient Clinic Notes The following labs were independently interpreted: Chemistry and show no acute abnormality I independently reviewed the following imaging with scope of interpretation limited to determining acute life threatening conditions related to emergency care: CT Head and agree with the  radiologist interpretation with the following exceptions: none I personally reviewed and interpreted cardiac monitoring: normal sinus rhythm  I personally reviewed and interpreted the pt's EKG: see above for interpretation  I have reviewed the patients home medications and made adjustments as needed Consults: Neurology Social Determinants of health:  Geriatric  Portions of this note were generated with Scientist, clinical (histocompatibility and immunogenetics). Dictation errors may occur despite best attempts at proofreading.     Final Clinical Impression(s) / ED Diagnoses Final diagnoses:  Weakness of left lower extremity  Acute pain of left lower extremity    Rx / DC Orders ED Discharge Orders     None         Rondel Baton, MD 10/02/23 [phone]

## 2023-10-01 NOTE — Code Documentation (Signed)
 Stroke Response Nurse Documentation Code Documentation  Tristan Gardner is a 89 y.o. male arriving to [facility]  via [facility] on 10/01/23 with past medical hx of Afib, DM, HTN, CVA. On Xarelto (rivaroxaban) daily. Code stroke was activated by EMS.   Patient from home where he was LKW at 1630 and now complaining of LLE numbness and weakness.  Stroke team at the bedside on patient arrival. Labs drawn and patient cleared for CT. Patient to CT with team. NIHSS 5, see documentation for details and code stroke times.  The following imaging was completed:  CT Head, CTA, and CTP. Patient is not a candidate for IV Thrombolytic due to on Xarelto. Patient is not a candidate for IR due to no  LVO.   Care Plan: q2h vitals and neuro checks.    Bedside handoff with ED RN.    Mordecai Rasmussen  Stroke Response RN

## 2023-10-01 NOTE — ED Notes (Signed)
 Patient returned to room from MRI

## 2023-10-02 ENCOUNTER — Telehealth: Payer: Self-pay | Admitting: Pharmacy Technician

## 2023-10-02 NOTE — Plan of Care (Addendum)
 MRI brain and thoracic spine as well as lumbar spine negative for acute process.  On review of records, recently discussed with neurosurgery with plan for conservative management but consideration of elective surgery if he were to have worsening symptoms from the lumbar spine chronic degenerative changes  Based on history suspected that his symptoms today do represent worsening symptoms from these lumbar spine changes  Consider reengagement of neurosurgery; if they feel lumbar spine MRI findings insufficient to explain symptoms or have concerns about C-spine on exam, defer need for possible MRI C-spine to their evaluation  Inpatient neurology will sign off at this time, please reach out if new questions or concerns arise  No primary team listed so these recommendations are left only in plan of care note here  Brooke Dare MD-PhD [facility] [phone] Available 7 AM to 7 PM, outside these hours please contact Neurologist on call listed on AMION

## 2023-10-02 NOTE — ED Notes (Signed)
 [facility] called, represenative Dedrica RN to inquire about pts condition.

## 2023-10-02 NOTE — ED Notes (Addendum)
 Attempted to call Lonni Fix [phone]), no answer at this time. [employer] stage manager states this is his daughter and he would like to have her readded as a contact as her removal is a mistake, wanting her to be update on his condition.

## 2023-10-02 NOTE — ED Notes (Signed)
 Granddaughter/Caregiver Vira Browns [phone] would like an update asap

## 2023-10-02 NOTE — ED Notes (Signed)
 Patient attempted to stand, required maximum assistance. Unable to stand on own. Unsteady on feet.

## 2023-10-02 NOTE — [place] FL2 (Signed)
 [place] MEDICAID FL2 LEVEL OF CARE FORM     IDENTIFICATION  Patient Name: Tristan Gardner Birthdate: [birth year] Sex: male Admission Date (Current Location): 10/01/2023  [facility] and [place] Number:  Producer, television/film/video and Address:  [facility] [place], [address] [place], [place] [address]      Provider Number: [id number]  Attending Physician Name and Address:  System, Provider Not In  Relative Name and Phone Number:       Current Level of Care: Hospital Recommended Level of Care: Skilled Nursing Facility Prior Approval Number:    Date Approved/Denied:   PASRR Number: [id number]  Discharge Plan: SNF    Current Diagnoses: Patient Active Problem List   Diagnosis Date Noted   Left upper quadrant abdominal pain 09/10/2023   Hypotension 06/11/2023   Chest pain 05/20/2023   Elevated troponin 05/20/2023   Essential hypertension 05/20/2023   Aortic root dilatation (HCC) 05/01/2023   Low serum vitamin B12 03/20/2023   Folate deficiency 03/20/2023   GI bleed 02/26/2023   Atrial fibrillation, chronic (HCC) 02/26/2023   Pain, dental 12/29/2022   Hepatic lesion 12/29/2022   Dyslipidemia 12/29/2022   Prediabetes 12/29/2022   Class 1 obesity due to excess calories with body mass index (BMI) of 34.0 to 34.9 in adult 12/29/2022   Lumbar spinal stenosis 12/28/2022   Near syncope 12/24/2022   Neurological deficit, transient 12/24/2022   H/O: CVA (cerebrovascular accident) 12/24/2022   Peripheral neuropathy 12/24/2022   Acute gastric ulcer with hemorrhage 10/23/2022   Gastrointestinal hemorrhage 10/23/2022   IDA (iron deficiency anemia) 10/19/2022   Heme positive stool 10/19/2022   Chest pain, rule out acute myocardial infarction 08/06/2020   Elevated blood pressure reading 08/06/2020   H/O iron deficiency anemia 08/06/2020   Atrial flutter (HCC) 05/18/2020   Atypical chest pain 05/17/2020   Chronic atrial fibrillation with RVR (HCC) 03/11/2020     Orientation RESPIRATION BLADDER Height & Weight     Self, Time, Situation, Place  Normal Continent Weight: 251 lb 12.3 oz (114.2 kg) Height:     BEHAVIORAL SYMPTOMS/MOOD NEUROLOGICAL BOWEL NUTRITION STATUS      Continent Diet (See discharge summary)  AMBULATORY STATUS COMMUNICATION OF NEEDS Skin   Extensive Assist Verbally Normal                       Personal Care Assistance Level of Assistance  Bathing, Feeding, Dressing Bathing Assistance: Maximum assistance Feeding assistance: Limited assistance Dressing Assistance: Maximum assistance     Functional Limitations Info  Sight, Hearing, Speech Sight Info: Impaired (Glasses) Hearing Info: Adequate Speech Info: Adequate    SPECIAL CARE FACTORS FREQUENCY  PT (By licensed PT), OT (By licensed OT)     PT Frequency: 5x weekly OT Frequency: 5x weekly            Contractures Contractures Info: Not present    Additional Factors Info  Code Status, Allergies Code Status Info: Full Code Allergies Info: Shellfish           Current Medications (10/02/2023):  This is the current hospital active medication list Current Facility-Administered Medications  Medication Dose Route Frequency Provider Last Rate Last Admin   sodium chloride flush (NS) 0.9 % injection 3 mL  3 mL Intravenous Once Rondel Baton, MD       Current Outpatient Medications  Medication Sig Dispense Refill   acetaminophen (TYLENOL) 325 MG tablet Take 2 tablets (650 mg total) by mouth every 6 (six)  hours as needed for mild pain (or Fever >/= 101).     albuterol (VENTOLIN HFA) 108 (90 Base) MCG/ACT inhaler Inhale 2 puffs into the lungs every 4 (four) hours as needed for wheezing or shortness of breath.     atorvastatin (LIPITOR) 40 MG tablet Take 40 mg by mouth at bedtime.     baclofen (LIORESAL) 10 MG tablet Take 15 mg by mouth 3 (three) times daily.     cyanocobalamin (VITAMIN B12) 1000 MCG tablet Take 1,000 mcg by mouth daily.     diclofenac  Sodium (VOLTAREN) 1 % GEL Apply 4 g topically 4 (four) times daily. (Patient not taking: Reported on 08/26/2023)     DULoxetine (CYMBALTA) 30 MG capsule Take 30 mg by mouth in the morning.     ferrous sulfate 325 (65 FE) MG tablet Take 325 mg by mouth daily with breakfast.     folic acid (FOLVITE) 1 MG tablet Take 1 mg by mouth daily.     gabapentin (NEURONTIN) 300 MG capsule Take 300 mg by mouth 3 (three) times daily.     metoprolol succinate (TOPROL-XL) 50 MG 24 hr tablet Take 50 mg by mouth daily.     pantoprazole (PROTONIX) 40 MG tablet Take 1 tablet (40 mg total) by mouth 2 (two) times daily before a meal. 120 tablet 0   XARELTO 20 MG TABS tablet Take 1 tablet (20 mg total) by mouth every morning. START ON 7/10 (Patient taking differently: Take 20 mg by mouth daily with supper.) 30 tablet      Discharge Medications: Please see discharge summary for a list of discharge medications.  Relevant Imaging Results:  Relevant Lab Results:   Additional Information SSN: [id number]  Inis Sizer, LCSW

## 2023-10-02 NOTE — ED Provider Notes (Signed)
 Emergency Medicine Observation Re-evaluation Note  Tristan Gardner is a 89 y.o. male, seen on rounds today.  Pt initially presented to the ED for complaints of Code Stroke Currently, the patient is eating lunch.  Physical Exam  BP (!) 145/79   Pulse (!) 114   Temp 98.6 F (37 C)   Resp (!) 23   Wt 114.2 kg   SpO2 100%   BMI 35.11 kg/m  Physical Exam General: nad Cardiac: tachycardia Lungs: clear Psych: cooperative, awake and alert  ED Course / MDM  EKG:EKG Interpretation Date/Time:  Monday October 01 2023 19:06:52 EDT Ventricular Rate:  105 PR Interval:  144 QRS Duration:  99 QT Interval:  358 QTC Calculation: 474 R Axis:   2  Text Interpretation: Atrial fibrillation RSR' in V1 or V2, right VCD or RVH Confirmed by Vonita Moss [phone]) on 10/01/2023 7:19:34 PM  I have reviewed the labs performed to date as well as medications administered while in observation.  Recent changes in the last 24 hours include neurology has signed off and feels images show chronic findings but no new findings.  He can receive services at [facility].  Plan  Current plan is for pt's grandson coming to pick him up and continue services as an outpt and follow up with [facility].    Gwyneth Sprout, MD 10/02/23 1505

## 2023-10-02 NOTE — Telephone Encounter (Signed)
 Auth Submission: APPROVED Site of care: Site of care: [facility] Payer: [facility] Medication & CPT/J Code(s) submitted: Monoferric (Ferrci derisomaltose) [phone] Route of submission (phone, fax, portal):  Phone # Fax # Auth type: Buy/Bill PB Units/visits requested: x1 dose will be given. Reference number: [id number] Approval from: 04/02/23 to 03/02/24  Auth was approved for x3 doses if needed up until 03/02/24

## 2023-10-02 NOTE — ED Notes (Signed)
 Discharge paper work reviewed with pt. and grandson. Pt will leave with grandson for home. No new onset distress noted at this time.

## 2023-10-02 NOTE — Evaluation (Signed)
 Physical Therapy Evaluation Patient Details Name: Tristan Gardner MRN: [id number] DOB: Mar 29, 1935 Today's Date: 10/02/2023  History of Present Illness  Tristan Gardner is an 89 yo male who was brought to ED on 3/31 as a code stroke. Pt with L LE numbness and weakness with inability to stand. PMH: A-fib on Xarelto, T2DM, HTN, chronic back pain from stenosis in lumbar spine, and stroke   Clinical Impression  Pt admitted with above. Pt was living at home with granddaughter and her family and attend [facility] during the day PTA. Pt was amb with RW in home but reports using w/c at [facility]. At this time pt with L LE weakness and impaired sensation limiting ability to stand and ambulate. Pt to benefit from inpatient rehab program > 3 hrs a day to address above deficits. Aware patient attends [facility] program during the day and unsure if they have skilled therapy has an option, however pt would greatly benefit. Pt with good home set up and support. Acute PT to cont to follow.        If plan is discharge home, recommend the following: A lot of help with walking and/or transfers;A lot of help with bathing/dressing/bathroom;Assistance with cooking/housework;Direct supervision/assist for medications management;Help with stairs or ramp for entrance;Assist for transportation   Can travel by private vehicle   No    Equipment Recommendations Wheelchair (measurements PT);Wheelchair cushion (measurements PT);Hospital bed  Recommendations for Other Services       Functional Status Assessment Patient has had a recent decline in their functional status and demonstrates the ability to make significant improvements in function in a reasonable and predictable amount of time.     Precautions / Restrictions Precautions Precautions: Fall Restrictions Weight Bearing Restrictions Per Provider Order: No      Mobility  Bed Mobility Overal bed mobility: Needs Assistance Bed Mobility: Sit to Supine, Rolling, Sidelying to  Sit Rolling: Min assist, Used rails Sidelying to sit: Max assist, HOB elevated   Sit to supine: Mod assist   General bed mobility comments: with increased time and minA pt able to bring LEs off side of gurney, maxA for trunk elevation and to scoot safely to EOB, modAx2 to return safely to supine on the gurney, increased time    Transfers Overall transfer level: Needs assistance Equipment used: Rolling walker (2 wheels) Transfers: Sit to/from Stand Sit to Stand: From elevated surface, Mod assist           General transfer comment: modA to power up, verbal cues to push up from gurney, increased time, trunk flexion    Ambulation/Gait Ambulation/Gait assistance: Mod assist   Assistive device: Rolling walker (2 wheels)   Gait velocity: decr     General Gait Details: 6 side steps to [facility] to the R, pt with difficulty clearing L foot and ended up sliding L foot, L knee did not buckle when advancing the R LE however pt with significant dependence on bilat UEs. pt then required to sit, attempted to stand and march in place again however pt unable  Stairs            Wheelchair Mobility     Tilt Bed    Modified Rankin (Stroke Patients Only)       Balance Overall balance assessment: Needs assistance Sitting-balance support: Bilateral upper extremity supported, Feet supported Sitting balance-Leahy Scale: Fair     Standing balance support: Bilateral upper extremity supported, Reliant on assistive device for balance, During functional activity Standing balance-Leahy Scale: Poor Standing balance  comment: Min A for balance especially initially at EOB                             Pertinent Vitals/Pain Pain Assessment Pain Assessment: No/denies pain    Home Living Family/patient expects to be discharged to:: Private residence Living Arrangements: Other relatives Engineer, building services) Available Help at Discharge: Family;Available PRN/intermittently Type of Home:  House Home Access: Level entry       Home Layout: One level Home Equipment: Rollator (4 wheels);Shower seat Additional Comments: attends [facility] during the day, grandson takes patient and [facility] brings patient back home    Prior Function Prior Level of Function : Needs assist             Mobility Comments: pt reports using walking in the home and w/c when at [facility] during the day ADLs Comments: grandson helps with dressing and bathing     Extremity/Trunk Assessment   Upper Extremity Assessment Upper Extremity Assessment: Generalized weakness    Lower Extremity Assessment Lower Extremity Assessment: LLE deficits/detail LLE Deficits / Details: grossly 3/5 except ankle DF, 2-/5 LLE Sensation: decreased light touch (reports 50% deficits)    Cervical / Trunk Assessment Cervical / Trunk Assessment: Kyphotic  Communication   Communication Communication: Impaired Factors Affecting Communication: Hearing impaired    Cognition Arousal: Alert Behavior During Therapy: WFL for tasks assessed/performed   PT - Cognitive impairments: No family/caregiver present to determine baseline                       PT - Cognition Comments: unsure of accuracy of PLOF  however it appears to be mostly accurate. P Following commands: Intact       Cueing Cueing Techniques: Verbal cues, Tactile cues     General Comments General comments (skin integrity, edema, etc.): VSS, no noted skin issue or edema    Exercises     Assessment/Plan    PT Assessment Patient needs continued PT services  PT Problem List Decreased strength;Decreased activity tolerance;Decreased balance;Decreased mobility;Decreased knowledge of use of DME;Pain;Impaired sensation       PT Treatment Interventions DME instruction;Gait training;Functional mobility training;Therapeutic activities;Therapeutic exercise;Balance training;Patient/family education;Neuromuscular re-education;Wheelchair mobility training     PT Goals (Current goals can be found in the Care Plan section)  Acute Rehab PT Goals Patient Stated Goal: to get his back better and go home PT Goal Formulation: With patient Time For Goal Achievement: 10/16/23 Potential to Achieve Goals: Good    Frequency Min 2X/week     Co-evaluation               AM-PAC PT "6 Clicks" Mobility  Outcome Measure Help needed turning from your back to your side while in a flat bed without using bedrails?: A Lot Help needed moving from lying on your back to sitting on the side of a flat bed without using bedrails?: A Lot Help needed moving to and from a bed to a chair (including a wheelchair)?: A Lot Help needed standing up from a chair using your arms (e.g., wheelchair or bedside chair)?: A Lot Help needed to walk in hospital room?: A Lot Help needed climbing 3-5 steps with a railing? : Total 6 Click Score: 11    End of Session Equipment Utilized During Treatment: Gait belt Activity Tolerance: Patient tolerated treatment well;Patient limited by pain Patient left: in bed;with call bell/phone within reach Nurse Communication: Mobility status PT Visit Diagnosis: Muscle weakness (  generalized) (M62.81);Difficulty in walking, not elsewhere classified (R26.2)    Time: 2956-2130 PT Time Calculation (min) (ACUTE ONLY): 30 min   Charges:   PT Evaluation $PT Eval Moderate Complexity: 1 Mod PT Treatments $Therapeutic Activity: 8-22 mins PT General Charges $$ ACUTE PT VISIT: 1 Visit         Lewis Shock, PT, DPT Acute Rehabilitation Services Secure chat preferred Office #: [phone]   Iona Hansen 10/02/2023, 10:37 AM

## 2023-10-02 NOTE — Progress Notes (Addendum)
 2:35pm: CSW received call from [facility] who states per [facility] therapy team, patient does not need additional time at [facility] but can receive therapy services at the center he attends daily. Lucky Rathke states patient will receive therapy three times weekly.  CSW spoke with patient's grandson Demario to inform him of information. Demario states he will be here about 4pm to pick patient up and take him home.  CSW notified RN and MD of information.  1:20pm: CSW spoke with patient's grandson to present him with bed offers - [facility] was chosen.  CSW returned call to [facility] to inform her of information.  12:30pm: CSW received consult for patient for SNF placement.  CSW familiar with patient as he was in the ED in February and was placed at [facility].  CSW attempted to reach patient's daughter Brennan Bailey without success.  CSW was able to speak with patient's grandson Demario who provided CSW with permission to initiate SNF search.  CSW spoke with patient's granddaughter [name] who states patient can return to SNF but does not want to be placed at [facility] again.  CSW spoke with [place] at [facility] to inform her of information.  CSW completed FL2 and will fax patient's clinicals out to obtain bed offers.   Edwin Dada, MSW, LCSW Transitions of Care  Clinical Social Worker II [phone]

## 2023-10-03 ENCOUNTER — Other Ambulatory Visit (HOSPITAL_COMMUNITY): Payer: Self-pay

## 2023-10-03 ENCOUNTER — Other Ambulatory Visit (HOSPITAL_COMMUNITY): Payer: Self-pay | Admitting: Pharmacy Technician

## 2023-10-03 ENCOUNTER — Other Ambulatory Visit: Payer: Self-pay

## 2023-10-03 DIAGNOSIS — D500 Iron deficiency anemia secondary to blood loss (chronic): Secondary | ICD-10-CM

## 2023-10-03 DIAGNOSIS — D508 Other iron deficiency anemias: Secondary | ICD-10-CM

## 2023-10-10 ENCOUNTER — Encounter (HOSPITAL_COMMUNITY): Payer: Medicare (Managed Care)

## 2023-10-12 ENCOUNTER — Encounter (HOSPITAL_COMMUNITY)
Admission: RE | Admit: 2023-10-12 | Discharge: 2023-10-12 | Disposition: A | Discharge status: Home / Self Care | Payer: Medicare (Managed Care) | Source: Ambulatory Visit

## 2023-10-12 DIAGNOSIS — D508 Other iron deficiency anemias: Secondary | ICD-10-CM | POA: Insufficient documentation

## 2023-10-12 DIAGNOSIS — D500 Iron deficiency anemia secondary to blood loss (chronic): Secondary | ICD-10-CM | POA: Insufficient documentation

## 2023-10-12 MED ORDER — FERRIC DERISOMALTOSE(ONE DOSE) 1000 MG/10ML IV SOLN
1000.0000 mg | Freq: Once | INTRAVENOUS | Status: AC
  Administered 2023-10-12: 1000 mg via INTRAVENOUS
  Filled 2023-10-12: qty 10

## 2023-10-31 NOTE — Progress Notes (Signed)
 To evaluate for substances causing his presentation

## 2023-11-14 DIAGNOSIS — H2512 Age-related nuclear cataract, left eye: Secondary | ICD-10-CM | POA: Insufficient documentation

## 2023-12-11 ENCOUNTER — Other Ambulatory Visit: Payer: Self-pay

## 2023-12-11 DIAGNOSIS — D649 Anemia, unspecified: Secondary | ICD-10-CM

## 2023-12-11 NOTE — Assessment & Plan Note (Deleted)
 Lab drawn today showed improvement Will continue b12 1000 mcg daily

## 2023-12-11 NOTE — Assessment & Plan Note (Deleted)
 Folate improved on oral  Continue folic acid daily

## 2023-12-11 NOTE — Progress Notes (Deleted)
 [facility] OFFICE PROGRESS NOTE  Patient Care Team: Sarrah Cure, DO as PCP - General (Geriatric Medicine) Ardeen Kohler, MD as PCP - Electrophysiology (Cardiology) Cody Das, MD as PCP - Cardiology (Cardiology)  89 y.o.male with history of atrial fibrillation, GIB, CAD, stroke, neuropathy, anemia of chronic disease, b12 deficiency, iron  deficiency anemia here for follow up.   B12 was 167 in Nov. Ferritn was 26.   Received monoferric  on 06/07/23.   Previously he was on both antiplatelet and anticoagulation.  Currently report he is not on any antiplatelet or anticoagulation.   Relevant history: History of both antiplatelet and anticoagulation. GIB Last colonoscopy: 2024  Last EGD: 2024   Received monoferric  on 06/07/23 and 10/12/23. History of GIB. Recommend follow up with GI for evaluation.  Assessment & Plan   No orders of the defined types were placed in this encounter.    Lowanda Ruddy, MD  INTERVAL HISTORY: Patient returns for follow-up.  Oncology History   No history exists.     PHYSICAL EXAMINATION: ECOG PERFORMANCE STATUS: {CHL ONC ECOG PS:[phone]}  There were no vitals filed for this visit. There were no vitals filed for this visit.  Relevant data reviewed during this visit included ***

## 2023-12-11 NOTE — Assessment & Plan Note (Deleted)
 Labs showed

## 2023-12-13 ENCOUNTER — Inpatient Hospital Stay: Payer: Medicare (Managed Care)

## 2023-12-13 ENCOUNTER — Ambulatory Visit: Payer: Medicare (Managed Care)

## 2023-12-13 DIAGNOSIS — E538 Deficiency of other specified B group vitamins: Secondary | ICD-10-CM

## 2023-12-13 DIAGNOSIS — D508 Other iron deficiency anemias: Secondary | ICD-10-CM

## 2023-12-14 NOTE — Progress Notes (Deleted)
  [facility] OFFICE PROGRESS NOTE  Patient Care Team: Sarrah Cure, DO as PCP - General (Geriatric Medicine) Ardeen Kohler, MD as PCP - Electrophysiology (Cardiology) Cody Das, MD as PCP - Cardiology (Cardiology)  89 y.o.male with history of atrial fibrillation, GIB, CAD, stroke, neuropathy, anemia of chronic disease, b12 deficiency, iron  deficiency anemia here for follow up.   B12 was 167 in Nov. Ferritn was 26.   Received monoferric  on 06/07/23 and 10/12/23.   Previously he was on both antiplatelet and anticoagulation.  Currently report he is not on any antiplatelet or anticoagulation.   Relevant history: History of both antiplatelet and anticoagulation. GIB Last colonoscopy: 2024  Last EGD: 2024   Received monoferric  on 06/07/23 and 10/12/23. History of GIB. Recommend follow up with GI for evaluation.  B12 and folate improved.  Will rule out myeloma, also has anemia of chronic disease. Assessment & Plan Other iron  deficiency anemia Labs showed  B12 deficiency Low at 167. Lab drawn today results pending Will continue b12 1000 mcg daily Folate deficiency Initially 5.6.  Folate improved on oral  Continue folic acid  daily  No orders of the defined types were placed in this encounter.    Lowanda Ruddy, MD  INTERVAL HISTORY: Patient returns for follow-up.   PHYSICAL EXAMINATION: ECOG PERFORMANCE STATUS: {CHL ONC ECOG [phone]}  There were no vitals filed for this visit. There were no vitals filed for this visit.  Relevant data reviewed during this visit included ***

## 2023-12-14 NOTE — Assessment & Plan Note (Deleted)
 Labs showed

## 2023-12-14 NOTE — Assessment & Plan Note (Deleted)
 Initially 5.6.  Folate improved on oral  Continue folic acid  daily

## 2023-12-14 NOTE — Assessment & Plan Note (Deleted)
 Low at 167. Lab drawn today results pending Will continue b12 1000 mcg daily

## 2023-12-17 ENCOUNTER — Inpatient Hospital Stay: Payer: Medicare (Managed Care)

## 2023-12-17 DIAGNOSIS — E538 Deficiency of other specified B group vitamins: Secondary | ICD-10-CM

## 2023-12-17 DIAGNOSIS — D508 Other iron deficiency anemias: Secondary | ICD-10-CM

## 2023-12-17 DIAGNOSIS — R768 Other specified abnormal immunological findings in serum: Secondary | ICD-10-CM | POA: Insufficient documentation

## 2023-12-17 DIAGNOSIS — D500 Iron deficiency anemia secondary to blood loss (chronic): Secondary | ICD-10-CM | POA: Insufficient documentation

## 2023-12-18 ENCOUNTER — Other Ambulatory Visit: Payer: Medicare (Managed Care)

## 2023-12-18 ENCOUNTER — Telehealth: Payer: Self-pay

## 2023-12-18 ENCOUNTER — Ambulatory Visit: Payer: Medicare (Managed Care)

## 2023-12-18 NOTE — Telephone Encounter (Signed)
 Left a voicemail with the scheduler at [facility] to please return my call to reschedule the patients appointments.

## 2023-12-30 NOTE — Progress Notes (Unsigned)
 Tristan Gardner  Patient Care Team: Tristan Annabella CROME, DO as PCP - General (Geriatric Medicine) Tristan Chew, MD as PCP - Electrophysiology (Cardiology) Tristan Newman PARAS, MD as PCP - Cardiology (Cardiology)  89 y.o.male with history of atrial fibrillation, GIB, CAD, stroke, neuropathy, anemia of chronic disease, b12 deficiency, iron  deficiency anemia here for follow up.   B12 was 167 in Nov. Ferritn was 26.   Received monoferric  on 06/07/23 and again in April 2024.  Previously with both low B12 and folate deficiency.  They improved.   Previously he was on both antiplatelet and anticoagulation.  Currently report he is not on any antiplatelet or anticoagulation.   Relevant history: History of both antiplatelet and anticoagulation. GIB Last colonoscopy: 2024  Last EGD: 2024   Repeat labs today showed low MCV again. MCV down to 80. iron  deficiency again. History of GIB. Recommend follow up with GI for evaluation. Assessment & Plan   No orders of the defined types were placed in this encounter.    Tristan JAYSON Chihuahua, MD  INTERVAL HISTORY: Patient returns for follow-up.  Last received IV iron  in April with Monoferric .  He is taking B12 and folate.  Report stool normal brown. No bloody stool or urine. No stomach pain, heartburn, nausea, vomiting. He Xarelto . No aspirin  or plavix .   Previous Hematology hx:  He has history of atrial fibrillation on chronic anticoagulation.  02/2023 he presented with symptomatic anemia, syncope, hemoglobin was 6.4.  Reported previously was also on Plavix  for history of stroke.  Ferritin was 8 in August 2024 with microcytic anemia.  B12 was low.   10/23/22 colonoscopy The perianal and digital rectal examinations were normal. A large amount of liquid semi- liquid stool was found in the entire colon, interfering with visualization. Small, medium, and flat lesions could not be excluded due to the remaining stool. A single  medium- sized angiodysplastic lesion without bleeding was found in the proximal ascending colon. The exam was otherwise without abnormality on direct and retroflexion views.   EGD - Normal esophagus. - Non- bleeding gastric ulcers with no stigmata of bleeding. Biopsied. - Gastropathy and gastric erosions. - Normal examined duodenum.   02/27/23 Small bowel enteroscopy Red blood was found in the cardia. The entire examined stomach was normal. A single angiodysplastic lesion with no bleeding was found in the third portion of the duodenum. For hemostasis, one hemostatic clip was successfully placed. There was no bleeding at the end of the procedure. There was no evidence of significant pathology at 140 cm ( from the incisors)   He was on plavix  until discontinued when saw cardiologist.  PHYSICAL EXAMINATION:   Vitals:   12/31/23 1615  BP: 121/69  Pulse: 65  Resp: 16  Temp: 99.1 F (37.3 C)  SpO2: 99%   Filed Weights   12/31/23 1615  Weight: 250 lb (113.4 kg)    Relevant data reviewed during this visit included ***

## 2023-12-31 ENCOUNTER — Inpatient Hospital Stay: Payer: Medicare (Managed Care)

## 2023-12-31 ENCOUNTER — Inpatient Hospital Stay (HOSPITAL_BASED_OUTPATIENT_CLINIC_OR_DEPARTMENT_OTHER): Payer: Medicare (Managed Care)

## 2023-12-31 VITALS — BP 121/69 | HR 65 | Temp 99.1°F | Resp 16 | Ht 71.0 in | Wt 250.0 lb

## 2023-12-31 DIAGNOSIS — D649 Anemia, unspecified: Secondary | ICD-10-CM

## 2023-12-31 DIAGNOSIS — D500 Iron deficiency anemia secondary to blood loss (chronic): Secondary | ICD-10-CM

## 2023-12-31 DIAGNOSIS — R768 Other specified abnormal immunological findings in serum: Secondary | ICD-10-CM

## 2023-12-31 LAB — CMP (CANCER CENTER ONLY)
ALT: 12 U/L (ref 0–44)
AST: 18 U/L (ref 15–41)
Albumin: 3.6 g/dL (ref 3.5–5.0)
Alkaline Phosphatase: 90 U/L (ref 38–126)
Anion gap: 10 (ref 5–15)
BUN: 10 mg/dL (ref 8–23)
CO2: 24 mmol/L (ref 22–32)
Calcium: 9.1 mg/dL (ref 8.9–10.3)
Chloride: 102 mmol/L (ref 98–111)
Creatinine: 0.98 mg/dL (ref 0.61–1.24)
GFR, Estimated: >60 mL/min (ref >60)
Glucose, Bld: 105 mg/dL — ABNORMAL HIGH (ref 70–99)
Potassium: 3.9 mmol/L (ref 3.5–5.1)
Sodium: 136 mmol/L (ref 135–145)
Total Bilirubin: 0.3 mg/dL (ref 0.0–1.2)
Total Protein: 8 g/dL (ref 6.5–8.1)

## 2023-12-31 LAB — CBC WITH DIFFERENTIAL (CANCER CENTER ONLY)
Abs Immature Granulocytes: 0 10*3/uL (ref 0.00–0.07)
Basophils Absolute: 0 10*3/uL (ref 0.0–0.1)
Basophils Relative: 1 %
Eosinophils Absolute: 0.1 10*3/uL (ref 0.0–0.5)
Eosinophils Relative: 3 %
HCT: 30.1 % — ABNORMAL LOW (ref 39.0–52.0)
Hemoglobin: 9.6 g/dL — ABNORMAL LOW (ref 13.0–17.0)
Immature Granulocytes: 0 %
Lymphocytes Relative: 33 %
Lymphs Abs: 1.1 10*3/uL (ref 0.7–4.0)
MCH: 25.6 pg — ABNORMAL LOW (ref 26.0–34.0)
MCHC: 31.9 g/dL (ref 30.0–36.0)
MCV: 80.3 fL (ref 80.0–100.0)
Monocytes Absolute: 0.5 10*3/uL (ref 0.1–1.0)
Monocytes Relative: 14 %
Neutro Abs: 1.6 10*3/uL — ABNORMAL LOW (ref 1.7–7.7)
Neutrophils Relative %: 49 %
Platelet Count: 282 10*3/uL (ref 150–400)
RBC: 3.75 MIL/uL — ABNORMAL LOW (ref 4.22–5.81)
RDW: 18.5 % — ABNORMAL HIGH (ref 11.5–15.5)
WBC Count: 3.2 10*3/uL — ABNORMAL LOW (ref 4.0–10.5)
nRBC: 0 % (ref 0.0–0.2)

## 2023-12-31 LAB — VITAMIN B12: Vitamin B-12: 811 pg/mL (ref 180–914)

## 2023-12-31 LAB — FOLATE: Folate: 28.4 ng/mL (ref >5.9)

## 2024-01-01 ENCOUNTER — Ambulatory Visit: Payer: Self-pay

## 2024-01-01 DIAGNOSIS — R768 Other specified abnormal immunological findings in serum: Secondary | ICD-10-CM | POA: Insufficient documentation

## 2024-01-01 LAB — IGG, IGA, IGM
IgA: 1623 mg/dL — ABNORMAL HIGH (ref 61–437)
IgG (Immunoglobin G), Serum: 582 mg/dL — ABNORMAL LOW (ref 603–1613)
IgM (Immunoglobulin M), Srm: 16 mg/dL (ref 15–143)

## 2024-01-01 LAB — KAPPA/LAMBDA LIGHT CHAINS
Kappa free light chain: 12.4 mg/L (ref 3.3–19.4)
Kappa, lambda light chain ratio: 0.13 — ABNORMAL LOW (ref 0.26–1.65)
Lambda free light chains: 97.3 mg/L — ABNORMAL HIGH (ref 5.7–26.3)

## 2024-01-01 LAB — FERRITIN: Ferritin: 23 ng/mL — ABNORMAL LOW (ref 24–336)

## 2024-01-01 NOTE — Assessment & Plan Note (Addendum)
 Recurrent Watch for GIB. Repeat iv iron  Repeat EGD and colonoscopy if recurrent

## 2024-01-01 NOTE — Assessment & Plan Note (Addendum)
 Will obtain bone marrow biopsy.

## 2024-01-02 ENCOUNTER — Telehealth: Payer: Self-pay

## 2024-01-02 NOTE — Telephone Encounter (Signed)
 Left the grandson a voicemail with the scheduled appointment details.

## 2024-01-03 LAB — PROTEIN ELECTROPHORESIS, SERUM, WITH REFLEX
A/G Ratio: 0.7 (ref 0.7–1.7)
Albumin ELP: 3.1 g/dL (ref 2.9–4.4)
Alpha-1-Globulin: 0.2 g/dL (ref 0.0–0.4)
Alpha-2-Globulin: 0.8 g/dL (ref 0.4–1.0)
Beta Globulin: 1.7 g/dL — ABNORMAL HIGH (ref 0.7–1.3)
Gamma Globulin: 1.6 g/dL (ref 0.4–1.8)
Globulin, Total: 4.4 g/dL — ABNORMAL HIGH (ref 2.2–3.9)
M-Spike, %: 1 g/dL — ABNORMAL HIGH
SPEP Interpretation: 0
Total Protein ELP: 7.5 g/dL (ref 6.0–8.5)

## 2024-01-03 LAB — IMMUNOFIXATION REFLEX, SERUM
IgA: 1710 mg/dL — ABNORMAL HIGH (ref 61–437)
IgG (Immunoglobin G), Serum: 581 mg/dL — ABNORMAL LOW (ref 603–1613)
IgM (Immunoglobulin M), Srm: 17 mg/dL (ref 15–143)

## 2024-01-24 ENCOUNTER — Other Ambulatory Visit: Payer: Self-pay | Admitting: Interventional Radiology

## 2024-01-24 DIAGNOSIS — Z01818 Encounter for other preprocedural examination: Secondary | ICD-10-CM

## 2024-01-24 NOTE — H&P (Signed)
 Chief Complaint: Iron  deficiency anemia, elevated immunoglobulin A/positive M spike on serum protein electrophoresis; referred for image guided bone marrow biopsy  Referring Provider(s): Chang,R  Supervising Physician: Philip Cornet  Patient Status: [facility] - Out-pt  History of Present Illness: Tristan Gardner is an 89 y.o. male with past medical history of atrial fibrillation, coronary artery disease with prior MI, diabetes, stroke, hypertension and iron  deficiency anemia along with recently noted elevated IgA/positive M spike on serum protein electrophoresis.  He is scheduled today for image guided bone marrow biopsy for further evaluation.  *** Patient is Full Code  Past Medical History:  Diagnosis Date   Atrial fibrillation (HCC)    Coronary artery disease    Diabetes mellitus without complication (HCC)    Hypertension    NSTEMI (non-ST elevated myocardial infarction) (HCC)    Stroke [facility])     Past Surgical History:  Procedure Laterality Date   BIOPSY  10/23/2022   Procedure: BIOPSY;  Surgeon: Eda Iha, MD;  Location: [facility] ENDOSCOPY;  Service: Gastroenterology;;   CARDIAC SURGERY     COLONOSCOPY WITH PROPOFOL  N/A 10/23/2022   Procedure: COLONOSCOPY WITH PROPOFOL ;  Surgeon: Eda Iha, MD;  Location: [facility] ENDOSCOPY;  Service: Gastroenterology;  Laterality: N/A;   ENTEROSCOPY N/A 02/27/2023   Procedure: ENTEROSCOPY;  Surgeon: Kriss Estefana DEL, DO;  Location: [facility] ENDOSCOPY;  Service: Gastroenterology;  Laterality: N/A;   ESOPHAGOGASTRODUODENOSCOPY (EGD) WITH PROPOFOL  N/A 10/23/2022   Procedure: ESOPHAGOGASTRODUODENOSCOPY (EGD) WITH PROPOFOL ;  Surgeon: Eda Iha, MD;  Location: [facility] ENDOSCOPY;  Service: Gastroenterology;  Laterality: N/A;   HEMOSTASIS CLIP PLACEMENT  02/27/2023   Procedure: HEMOSTASIS CLIP PLACEMENT;  Surgeon: Kriss Estefana DEL, DO;  Location: [facility] ENDOSCOPY;  Service: Gastroenterology;;   IR INJECT/THERA/INC NEEDLE/CATH/PLC EPI/LUMB/SAC W/IMG   12/27/2022   LUMBAR LAMINECTOMY/DECOMPRESSION MICRODISCECTOMY N/A 01/03/2023   Procedure: Lumbar Two-Three, Lumbar Three-Four, Lumbar Four-Five Open laminectomies;  Surgeon: Cheryle Debby LABOR, MD;  Location: [facility] OR;  Service: Neurosurgery;  Laterality: N/A;   RADIOLOGY WITH ANESTHESIA N/A 12/27/2022   Procedure: TOTAL SPINE MYELOGRAM;  Surgeon: Radiologist, Medication, MD;  Location: [facility] OR;  Service: Radiology;  Laterality: N/A;   ROTATOR CUFF REPAIR      Allergies: Iodine and Shellfish allergy  Medications: Prior to Admission medications   Medication Sig Start Date End Date Taking? Authorizing Provider  acetaminophen  (TYLENOL ) 325 MG tablet Take 2 tablets (650 mg total) by mouth every 6 (six) hours as needed for mild pain (or Fever >/= 101). 10/24/22   Elgergawy, Brayton RAMAN, MD  albuterol  (VENTOLIN  HFA) 108 (90 Base) MCG/ACT inhaler Inhale 2 puffs into the lungs every 4 (four) hours as needed for wheezing or shortness of breath. 02/24/20   [provider]  atorvastatin  (LIPITOR) 40 MG tablet Take 40 mg by mouth at bedtime. 08/24/22   [provider]  baclofen  (LIORESAL ) 10 MG tablet Take 15 mg by mouth 3 (three) times daily.    [provider]  cyanocobalamin  (VITAMIN B12) 1000 MCG tablet Take 1,000 mcg by mouth daily.    [provider]  diclofenac  Sodium (VOLTAREN ) 1 % GEL Apply 4 g topically 4 (four) times daily. Patient not taking: Reported on 08/26/2023 06/14/23   Raenelle Coria, MD  DULoxetine  (CYMBALTA ) 30 MG capsule Take 30 mg by mouth in the morning.    [provider]  ferrous sulfate 325 (65 FE) MG tablet Take 325 mg by mouth daily with breakfast.    [provider]  folic acid  (FOLVITE ) 1 MG tablet Take  1 mg by mouth daily.    [provider]  gabapentin  (NEURONTIN ) 300 MG capsule Take 300 mg by mouth 3 (three) times daily.    [provider]  metoprolol  succinate (TOPROL -XL) 50 MG 24 hr tablet Take 50 mg by mouth  daily. 08/24/22   [provider]  pantoprazole  (PROTONIX ) 40 MG tablet Take 1 tablet (40 mg total) by mouth 2 (two) times daily before a meal. 03/02/23 08/26/23  Patsy Lenis, MD  XARELTO  20 MG TABS tablet Take 1 tablet (20 mg total) by mouth every morning. START ON 7/10 Patient taking differently: Take 20 mg by mouth daily with supper. 01/10/23   Ghimire, Donalda HERO, MD     Family History  Problem Relation Age of Onset   Diabetes Mother    Heart attack Father 83   Hypertension Brother    Heart disease Brother    Diabetes Brother     Social History   Socioeconomic History   Marital status: Married    Spouse name: Not on file   Number of children: Not on file   Years of education: Not on file   Highest education level: Not on file  Occupational History   Not on file  Tobacco Use   Smoking status: Former   Smokeless tobacco: Never  Vaping Use   Vaping status: Never Used  Substance and Sexual Activity   Alcohol use: No   Drug use: Never   Sexual activity: Not Currently  Other Topics Concern   Not on file  Social History Narrative   Not on file   Social Drivers of Health   Financial Resource Strain: Low Risk  (02/13/2023)   Received from [facility]   Overall Financial Resource Strain (CARDIA)    Difficulty of Paying Living Expenses: Not very hard  Food Insecurity: No Food Insecurity (06/11/2023)   Hunger Vital Sign    Worried About Running Out of Food in the Last Year: Never true    Ran Out of Food in the Last Year: Never true  Transportation Needs: No Transportation Needs (06/11/2023)   PRAPARE - Administrator, Civil Service (Medical): No    Lack of Transportation (Non-Medical): No  Physical Activity: Insufficiently Active (02/24/2020)   Received from [facility]   Exercise Vital Sign    On average, how many days per week do you engage in moderate to strenuous exercise (like a brisk walk)?: 2 days    On average, how many minutes do you engage  in exercise at this level?: 10 min  Stress: Stress Concern Present (02/24/2020)   Received from [facility] - Occupational Stress Questionnaire    Feeling of Stress : Very much  Social Connections: Unknown (11/03/2021)   Received from [facility]   Social Network    Social Network: Not on file       Review of Systems  Vital Signs:   Advance Care Plan: no documents on file.  Physical Exam  Imaging: No results found.  Labs:  CBC: Recent Labs    09/01/23 1736 09/10/23 1315 10/01/23 1817 10/01/23 1840 12/31/23 1548  WBC 4.2 4.3 3.8*  --  3.2*  HGB 8.4* 7.7* 8.0* 9.9* 9.6*  HCT 26.5* 24.1* 26.7* 29.0* 30.1*  PLT 234 214 259  --  282    COAGS: Recent Labs    02/27/23 0527 05/20/23 0955 10/01/23 1817  INR 1.5* 1.1 1.3*  APTT  --   --  37*    BMP: Recent Labs    08/25/23 0425 09/01/23 1736 10/01/23 1817 10/01/23 1840 12/31/23 1548  NA 136 136 134* 138 136  K 4.2 4.1 3.9 4.8 3.9  CL 100 99 103 102 102  CO2 23 24 20*  --  24  GLUCOSE 109* 115* 96 103* 105*  BUN 11 12 8 8 10   CALCIUM  10.6* 9.0 8.5*  --  9.1  CREATININE 0.99 1.00 0.98 1.20 0.98  GFRNONAA >60 >60 >60  --  >60    LIVER FUNCTION TESTS: Recent Labs    08/25/23 0425 09/01/23 1736 10/01/23 1817 12/31/23 1548  BILITOT 0.5 0.2 0.3 0.3  AST 20 20 28 18   ALT 13 15 18 12   ALKPHOS 67 64 68 90  PROT 7.7 7.3 7.1 8.0  ALBUMIN 3.2* 3.0* 3.0* 3.6    TUMOR MARKERS: No results for input(s): AFPTM, CEA, CA199, CHROMGRNA in the last 8760 hours.  Assessment and Plan: 89 y.o. male with past medical history of atrial fibrillation, coronary artery disease with prior MI, diabetes, stroke, hypertension and iron  deficiency anemia along with recently noted elevated IgA/positive M spike on serum protein electrophoresis.  He is scheduled today for image guided bone marrow biopsy for further evaluation.Risks and benefits of procedure was discussed with  the patient including, but not limited to bleeding, infection, damage to adjacent structures or low yield requiring additional tests.  All of the questions were answered and there is agreement to proceed.  Consent signed and in chart.    Thank you for allowing our service to participate in Tristan Gardner 's care.  Electronically Signed: D. Franky Rakers, PA-C   01/24/2024, 2:02 PM      I spent a total of 15 minutes    in face to face in clinical consultation, greater than 50% of which was counseling/coordinating care for image guided bone marrow biopsy

## 2024-01-25 ENCOUNTER — Other Ambulatory Visit: Payer: Self-pay

## 2024-01-25 ENCOUNTER — Ambulatory Visit (HOSPITAL_COMMUNITY)
Admission: RE | Admit: 2024-01-25 | Discharge: 2024-01-25 | Disposition: A | Discharge status: Home / Self Care | Payer: Medicare (Managed Care) | Source: Ambulatory Visit

## 2024-01-25 ENCOUNTER — Encounter (HOSPITAL_COMMUNITY): Payer: Self-pay

## 2024-01-25 DIAGNOSIS — C9000 Multiple myeloma not having achieved remission: Secondary | ICD-10-CM | POA: Diagnosis not present

## 2024-01-25 DIAGNOSIS — I2510 Atherosclerotic heart disease of native coronary artery without angina pectoris: Secondary | ICD-10-CM | POA: Insufficient documentation

## 2024-01-25 DIAGNOSIS — I252 Old myocardial infarction: Secondary | ICD-10-CM | POA: Insufficient documentation

## 2024-01-25 DIAGNOSIS — I4891 Unspecified atrial fibrillation: Secondary | ICD-10-CM | POA: Diagnosis not present

## 2024-01-25 DIAGNOSIS — Z8673 Personal history of transient ischemic attack (TIA), and cerebral infarction without residual deficits: Secondary | ICD-10-CM | POA: Diagnosis not present

## 2024-01-25 DIAGNOSIS — Z1379 Encounter for other screening for genetic and chromosomal anomalies: Secondary | ICD-10-CM | POA: Insufficient documentation

## 2024-01-25 DIAGNOSIS — D709 Neutropenia, unspecified: Secondary | ICD-10-CM | POA: Diagnosis not present

## 2024-01-25 DIAGNOSIS — Z01818 Encounter for other preprocedural examination: Secondary | ICD-10-CM

## 2024-01-25 DIAGNOSIS — R768 Other specified abnormal immunological findings in serum: Secondary | ICD-10-CM | POA: Insufficient documentation

## 2024-01-25 DIAGNOSIS — E119 Type 2 diabetes mellitus without complications: Secondary | ICD-10-CM | POA: Insufficient documentation

## 2024-01-25 DIAGNOSIS — D509 Iron deficiency anemia, unspecified: Secondary | ICD-10-CM | POA: Diagnosis not present

## 2024-01-25 DIAGNOSIS — I10 Essential (primary) hypertension: Secondary | ICD-10-CM | POA: Insufficient documentation

## 2024-01-25 LAB — CBC WITH DIFFERENTIAL/PLATELET
Abs Immature Granulocytes: 0.01 K/uL (ref 0.00–0.07)
Basophils Absolute: 0 K/uL (ref 0.0–0.1)
Basophils Relative: 1 %
Eosinophils Absolute: 0.1 K/uL (ref 0.0–0.5)
Eosinophils Relative: 4 %
HCT: 29.7 % — ABNORMAL LOW (ref 39.0–52.0)
Hemoglobin: 8.7 g/dL — ABNORMAL LOW (ref 13.0–17.0)
Immature Granulocytes: 0 %
Lymphocytes Relative: 28 %
Lymphs Abs: 0.8 K/uL (ref 0.7–4.0)
MCH: 24.6 pg — ABNORMAL LOW (ref 26.0–34.0)
MCHC: 29.3 g/dL — ABNORMAL LOW (ref 30.0–36.0)
MCV: 84.1 fL (ref 80.0–100.0)
Monocytes Absolute: 0.4 K/uL (ref 0.1–1.0)
Monocytes Relative: 13 %
Neutro Abs: 1.5 K/uL — ABNORMAL LOW (ref 1.7–7.7)
Neutrophils Relative %: 54 %
Platelets: 224 K/uL (ref 150–400)
RBC: 3.53 MIL/uL — ABNORMAL LOW (ref 4.22–5.81)
RDW: 18.8 % — ABNORMAL HIGH (ref 11.5–15.5)
WBC: 2.8 K/uL — ABNORMAL LOW (ref 4.0–10.5)
nRBC: 0 % (ref 0.0–0.2)

## 2024-01-25 LAB — GLUCOSE, CAPILLARY: Glucose-Capillary: 109 mg/dL — ABNORMAL HIGH (ref 70–99)

## 2024-01-25 MED ORDER — SODIUM CHLORIDE 0.9 % IV SOLN: INTRAVENOUS | Status: DC

## 2024-01-25 MED ORDER — MIDAZOLAM HCL 2 MG/2ML IJ SOLN
INTRAMUSCULAR | Status: AC | PRN
  Administered 2024-01-25 (×2): 1 mg via INTRAVENOUS

## 2024-01-25 MED ORDER — FENTANYL CITRATE (PF) 100 MCG/2ML IJ SOLN
INTRAMUSCULAR | Status: AC | PRN
  Administered 2024-01-25: 50 ug via INTRAVENOUS

## 2024-01-25 MED ORDER — FENTANYL CITRATE (PF) 100 MCG/2ML IJ SOLN
INTRAMUSCULAR | Status: AC
Start: 2024-01-25 — End: 2024-01-25
  Filled 2024-01-25: qty 2

## 2024-01-25 MED ORDER — MIDAZOLAM HCL 2 MG/2ML IJ SOLN
INTRAMUSCULAR | Status: AC
  Filled 2024-01-25: qty 4

## 2024-01-25 NOTE — Discharge Instructions (Addendum)
For questions /concerns may call Interventional Radiology at [phone] or  Interventional Radiology clinic [phone]   You may remove your dressing and shower tomorrow afternoon                Bone Marrow Aspiration and Bone Marrow Biopsy, Adult, Care After The following information offers guidance on how to care for yourself after your procedure. Your health care provider may also give you more specific instructions. If you have problems or questions, contact your health care provider. What can I expect after the procedure? After the procedure, it is common to have: Mild pain and tenderness. Swelling. Bruising. Follow these instructions at home: Incision care  Follow instructions from your health care provider about how to take care of the incision site. Make sure you: Wash your hands with soap and water for at least 20 seconds before and after you change your bandage (dressing). If soap and water are not available, use hand sanitizer. Change your dressing as told by your health care provider. Leave stitches (sutures), skin glue, or adhesive strips in place. These skin closures may need to stay in place for 2 weeks or longer. If adhesive strip edges start to loosen and curl up, you may trim the loose edges. Do not remove adhesive strips completely unless your health care provider tells you to do that. Check your incision site every day for signs of infection. Check for: More redness, swelling, or pain. Fluid or blood. Warmth. Pus or a bad smell. Activity Return to your normal activities as told by your health care provider. Ask your health care provider what activities are safe for you. Do not lift anything that is heavier than 10 lb (4.5 kg), or the limit that you are told, until your health care provider says that it is safe. If you were given a sedative during the procedure, it can affect you for several hours. Do not drive or operate machinery until your health care provider says  that it is safe. General instructions  Take over-the-counter and prescription medicines only as told by your health care provider. Do not take baths, swim, or use a hot tub until your health care provider approves. Ask your health care provider if you may take showers. You may only be allowed to take sponge baths. If directed, put ice on the affected area. To do this: Put ice in a plastic bag. Place a towel between your skin and the bag. Leave the ice on for 20 minutes, 2-3 times a day. If your skin turns bright red, remove the ice right away to prevent skin damage. The risk of skin damage is higher if you cannot feel pain, heat, or cold. Contact a health care provider if: You have signs of infection. Your pain is not controlled with medicine. You have cancer, and a temperature of 100.68F (38C) or higher. Get help right away if: You have a temperature of 101F (38.3C) or higher, or as told by your health care provider. You have bleeding from the incision site that cannot be controlled. This information is not intended to replace advice given to you by your health care provider. Make sure you discuss any questions you have with your health care provider.                                      Moderate Conscious Sedation, Adult, Care After After the procedure, it is  common to have: Sleepiness for a few hours. Impaired judgment for a few hours. Trouble with balance. Nausea or vomiting if you eat too soon. Follow these instructions at home: For the time period you were told by your health care provider:  Rest. Do not participate in activities where you could fall or become injured. Do not drive or use machinery. Do not drink alcohol. Do not take sleeping pills or medicines that cause drowsiness. Do not make important decisions or sign legal documents. Do not take care of children on your own. Eating and drinking Follow instructions from your health care provider about what you may  eat and drink. Drink enough fluid to keep your urine pale yellow. If you vomit: Drink clear fluids slowly and in small amounts as you are able. Clear fluids include water, ice chips, low-calorie sports drinks, and fruit juice that has water added to it (diluted fruit juice). Eat light and bland foods in small amounts as you are able. These foods include bananas, applesauce, rice, lean meats, toast, and crackers. General instructions Take over-the-counter and prescription medicines only as told by your health care provider. Have a responsible adult stay with you for the time you are told. Do not use any products that contain nicotine or tobacco. These products include cigarettes, chewing tobacco, and vaping devices, such as e-cigarettes. If you need help quitting, ask your health care provider. Return to your normal activities as told by your health care provider. Ask your health care provider what activities are safe for you. Your health care provider may give you more instructions. Make sure you know what you can and cannot do. Contact a health care provider if: You are still sleepy or having trouble with balance after 24 hours. You feel light-headed. You vomit every time you eat or drink. You get a rash. You have a fever. You have redness or swelling around the IV site. Get help right away if: You have trouble breathing. You start to feel confused at home. These symptoms may be an emergency. Get help right away. Call 911. Do not wait to see if the symptoms will go away. Do not drive yourself to the hospital. This information is not intended to replace advice given to you by your health care provider. Make sure you discuss any questions you have with your health care provider. Document Revised: 01/02/2022 Document Reviewed: 01/02/2022 Elsevier Patient Education  2024 [employer].

## 2024-01-25 NOTE — Procedures (Signed)
 Interventional Radiology Procedure:   Indications: Elevated immunoglobulin A  Procedure: CT guided bone marrow biopsy  Findings: 2 aspirates and 2 cores from right ilium  Complications: None     EBL: Minimal, less than 10 ml  Plan: Discharge to home in one hour.   Juleah Paradise R. Philip, MD  Pager: [phone]

## 2024-01-29 LAB — SURGICAL PATHOLOGY

## 2024-01-31 ENCOUNTER — Inpatient Hospital Stay: Payer: Medicare (Managed Care)

## 2024-01-31 VITALS — BP 123/72 | HR 73 | Temp 98.1°F | Resp 20 | Wt 262.1 lb

## 2024-01-31 DIAGNOSIS — C9000 Multiple myeloma not having achieved remission: Secondary | ICD-10-CM | POA: Diagnosis present

## 2024-01-31 DIAGNOSIS — Z7189 Other specified counseling: Secondary | ICD-10-CM | POA: Insufficient documentation

## 2024-01-31 DIAGNOSIS — E538 Deficiency of other specified B group vitamins: Secondary | ICD-10-CM

## 2024-01-31 DIAGNOSIS — D508 Other iron deficiency anemias: Secondary | ICD-10-CM

## 2024-01-31 DIAGNOSIS — E8809 Other disorders of plasma-protein metabolism, not elsewhere classified: Secondary | ICD-10-CM | POA: Insufficient documentation

## 2024-01-31 NOTE — Assessment & Plan Note (Deleted)
 Recurrent Watch for GIB. Repeat iv iron  Repeat EGD and colonoscopy if recurrent

## 2024-01-31 NOTE — Assessment & Plan Note (Addendum)
 Limited performance status Recommend home hospice Grandson will call back on which hospice they prefer after talking to family

## 2024-01-31 NOTE — Assessment & Plan Note (Deleted)
 Labs with improvement Will continue b12 1000 mcg daily

## 2024-01-31 NOTE — Assessment & Plan Note (Addendum)
 Discussed myeloma is not curable.  Given his other medical conditions, limited performance status, wheelchair-bound at baseline, home hospice is recommended.

## 2024-01-31 NOTE — Progress Notes (Signed)
 [facility] OFFICE PROGRESS NOTE  Patient Care Team: Cloria Annabella CROME, DO as PCP - General (Geriatric Medicine) Kennyth Chew, MD as PCP - Electrophysiology (Cardiology) Elmira Newman PARAS, MD as PCP - Cardiology (Cardiology)  89 y.o.male with history of atrial fibrillation, GIB, CAD, stroke, neuropathy, anemia of chronic disease, b12 deficiency, iron  deficiency anemia here for follow up.   B12 was 167 in Nov. Ferritn was 26.   Received monoferric  on 06/07/23 and again in April 2024.  Previously with both low B12 and folate deficiency.  They improved.   Previously he was on both antiplatelet and anticoagulation.  Currently report he is not on any antiplatelet or anticoagulation.   Relevant history: History of both antiplatelet and anticoagulation. GIB Last colonoscopy: 2024  Last EGD: 2024  Persistent anemia while MCV normalized. SPEP showed elevated IGA and M protein of 1.0. K/L 0.13. Lambda was 97.  BM biopsy was recommended and showed:  Lambda restricted plasma cell neoplasm.  Patient is wheelchair-bound at baseline.  He is not able to get up on his own.  His performance status is limited with cardiovascular disease, neuropathy, and treatment will likely cause significant side effect, and potential complication, shorten his life.  I discussed results with him, and over the phone with his grandson DeMario.  Multiple myeloma is not considered curable.  Treatment required long-term close monitoring and will expect potential complications, more side effects in patient with weak performance status.  He also reported poor memory.  Treatment will not improve this. We discussed based on the overall findings, home hospice is recommended.  Darden will talk to the family about preference of which hospice and let us  know.  We we will place an order accordingly. Assessment & Plan Plasma cell dyscrasia Limited performance status Recommend home hospice Grandson will call back on  which hospice they prefer after talking to family Goals of care, counseling/discussion Discussed myeloma is not curable.  Given his other medical conditions, limited performance status, wheelchair-bound at baseline, home hospice is recommended.    Pauletta JAYSON Chihuahua, MD  INTERVAL HISTORY: Patient returns for follow-up. Report hard to balance. Report nerves jumps. Report no bloody or dark stool. He cannot walk. He feels weak. He cannot stand by himself and needs 2 assistant.  Current Outpatient Medications on File Prior to Visit  Medication Sig Dispense Refill   acetaminophen  (TYLENOL ) 325 MG tablet Take 2 tablets (650 mg total) by mouth every 6 (six) hours as needed for mild pain (or Fever >/= 101).     albuterol  (VENTOLIN  HFA) 108 (90 Base) MCG/ACT inhaler Inhale 2 puffs into the lungs every 4 (four) hours as needed for wheezing or shortness of breath.     atorvastatin  (LIPITOR) 40 MG tablet Take 40 mg by mouth at bedtime.     baclofen  (LIORESAL ) 10 MG tablet Take 15 mg by mouth 3 (three) times daily.     cyanocobalamin  (VITAMIN B12) 1000 MCG tablet Take 1,000 mcg by mouth daily.     diclofenac  Sodium (VOLTAREN ) 1 % GEL Apply 4 g topically 4 (four) times daily.     DULoxetine  (CYMBALTA ) 30 MG capsule Take 30 mg by mouth in the morning.     ferrous sulfate 325 (65 FE) MG tablet Take 325 mg by mouth daily with breakfast.     folic acid  (FOLVITE ) 1 MG tablet Take 1 mg by mouth daily.     gabapentin  (NEURONTIN ) 300 MG capsule Take 300 mg by mouth 3 (three) times daily.  metoprolol  succinate (TOPROL -XL) 50 MG 24 hr tablet Take 50 mg by mouth daily.     pantoprazole  (PROTONIX ) 40 MG tablet Take 1 tablet (40 mg total) by mouth 2 (two) times daily before a meal. 120 tablet 0   XARELTO  20 MG TABS tablet Take 1 tablet (20 mg total) by mouth every morning. START ON 7/10 (Patient taking differently: Take 20 mg by mouth daily with supper.) 30 tablet    No current facility-administered medications on  file prior to visit.    PHYSICAL EXAMINATION: ECOG PERFORMANCE STATUS: 3 - Symptomatic, >50% confined to bed  Vitals:   01/31/24 1558  BP: 123/72  Pulse: 73  Resp: 20  Temp: 98.1 F (36.7 C)  SpO2: 100%   Filed Weights   01/31/24 1558  Weight: 262 lb 1.6 oz (118.9 kg)   GENERAL: alert, no distress and comfortable on wheelchair LUNGS: clear to auscultation and percussion with normal breathing effort HEART: regular rate & rhythm  ABDOMEN: abdomen soft, non-tender and distended. Musculoskeletal: Bilateral lower extremity edema  Relevant data reviewed during this visit included labs and biopsy.

## 2024-01-31 NOTE — Assessment & Plan Note (Deleted)
 Folate improved on oral  Continue folic acid daily

## 2024-02-01 NOTE — Addendum Note (Signed)
 Addended by: TINA HUDSON on: 02/01/2024 09:49 AM   Modules accepted: Orders

## 2024-02-04 ENCOUNTER — Telehealth: Payer: Self-pay

## 2024-02-04 ENCOUNTER — Other Ambulatory Visit: Payer: Self-pay

## 2024-02-04 DIAGNOSIS — E8809 Other disorders of plasma-protein metabolism, not elsewhere classified: Secondary | ICD-10-CM

## 2024-02-04 NOTE — Telephone Encounter (Signed)
 Spoke with Mr. Cordella who is Jettson's grandson. They have chosen [facility] and I will fax the referral to them. Andrea CHRISTELLA Plunk, RN

## 2024-02-05 ENCOUNTER — Encounter (HOSPITAL_COMMUNITY): Payer: Self-pay

## 2024-02-08 ENCOUNTER — Encounter (HOSPITAL_COMMUNITY): Payer: Self-pay

## 2024-02-22 ENCOUNTER — Telehealth (HOSPITAL_COMMUNITY): Payer: Self-pay

## 2024-02-22 NOTE — Telephone Encounter (Signed)
 Front office personnel called [facility] at [phone] to give a status update on the referral sent over via the fax machine.   Per clinical staff at the [facility], this pt is not appropriate for the [facility] at [facility] - - this pt just needs general cardiology. Per clinical staff, this pt saw Dr. Elmira back in 2024 and needs to follow up with him.   [facility] representative thanked front office personnel for the update on the referral.

## 2024-02-25 NOTE — Progress Notes (Deleted)
 Electrophysiology Office Note:   Date:  02/25/2024  ID:  Tristan Gardner, DOB [birth year], MRN [id number]  Primary Cardiologist: Newman JINNY Lawrence, MD Electrophysiologist: Fonda Kitty, MD  {Click to update primary MD,subspecialty MD or APP then REFRESH:1}    History of Present Illness:   Tristan Gardner is a 89 y.o. 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 Lawrence.  Discussed the use of AI scribe software for clinical note transcription with the patient, who gave verbal consent to proceed.  History of Present Illness     Review of systems complete and found to be negative unless listed in HPI.   EP Information / Studies Reviewed:    {EKGtoday:28818}      Nuclear Stress 05/2023:    The study is normal. The study is low risk.   No ST deviation was noted.   Left ventricular function is normal. End diastolic cavity size is normal.   Prior study not available for comparison.  Echo 12/2022:  1. Left ventricular ejection fraction, by estimation, is 50 to 55%. The  left ventricle has low normal function. The left ventricle has no regional  wall motion abnormalities. There is moderate concentric left ventricular  hypertrophy. Left ventricular  diastolic function could not be evaluated.   2. Right ventricular systolic function was not well visualized. The right  ventricular size is not well visualized. There is normal pulmonary artery  systolic pressure.   3. Left atrial size was mild to moderately dilated.   4. The mitral valve is normal in structure. Mild mitral valve  regurgitation. No evidence of mitral stenosis.   5. The aortic valve is tricuspid. There is mild calcification of the  aortic valve. There is mild thickening of the aortic valve. Aortic valve  regurgitation is not visualized.   6. Aortic dilatation noted. There  is mild dilatation of the ascending  aorta, measuring 40 mm.   7. The inferior vena cava is normal in size with greater than 50%  respiratory variability, suggesting right atrial pressure of 3 mmHg.   Risk Assessment/Calculations:    CHA2DS2-VASc Score = 5  {Confirm score is correct.  If not, click here to update score.  REFRESH note.  :1} This indicates a 7.2% annual risk of stroke. The patient's score is based upon: CHF History: 0 HTN History: 1 Diabetes History: 0 Stroke History: 2 Vascular Disease History: 0 Age Score: 2 Gender Score: 0   {This patient has a significant risk of stroke if diagnosed with atrial fibrillation.  Please consider VKA or DOAC agent for anticoagulation if the bleeding risk is acceptable.   You can also use the SmartPhrase .[email or web address] for documentation.   :[id number]} No BP recorded.  {Refresh Note OR Click here to enter BP  :1}***        Physical Exam:   VS:  There were no vitals taken for this visit.   Wt Readings from Last 3 Encounters:  01/31/24 262 lb 1.6 oz (118.9 kg)  01/25/24 240 lb (108.9 kg)  12/31/23 250 lb (113.4 kg)     GEN: Well nourished, well developed in no acute distress NECK: No JVD CARDIAC: {EPRHYTHM:28826}, no murmurs, rubs, gallops RESPIRATORY:  Clear to auscultation without rales, wheezing or rhonchi  ABDOMEN: Soft, non-distended EXTREMITIES:  No edema; No deformity   ASSESSMENT AND PLAN:   A-fib/flutter: Longstanding history, rate is controlled.  No associated symptoms. He is currently on Xarelto  20 mg daily, and surprisingly, also on Plavix  75 mg daily for prior history of stroke. I have stopped his Plavix , as I do not see any definite indication. I remain concerned regarding his ongoing Xarelto  use and anemia requiring blood transfusions, without clear cause. I will refer him to EP for evaluation for left atrial appendage closure.  I have seen Tristan Gardner in the office today who is being considered for a Watchman  left atrial appendage closure device. I believe they will benefit from this procedure given their history of atrial fibrillation, CHA2DS2-VASc score of *** and unadjusted ischemic stroke rate of ***% per year. Unfortunately, the patient is not felt to be a long term anticoagulation candidate secondary to ***. The patient's chart has been reviewed and I feel that they would be a candidate for short term oral anticoagulation after Watchman implant.   It is my belief that after undergoing a LAA closure procedure, Tristan Gardner will not need long term anticoagulation which eliminates anticoagulation side effects and major bleeding risk.   Procedural risks for the Watchman implant have been reviewed with the patient including a 0.5% risk of stroke, <1% risk of perforation and <1% risk of device embolization. Other risks include bleeding, vascular damage, tamponade, worsening renal function, and death. The patient understands these risk and wishes to proceed.     The published clinical data on the safety and effectiveness of WATCHMAN include but are not limited to the following: - Holmes DR, Jess BEARD, Sick P et al. for the PROTECT AF Investigators. Percutaneous closure of the left atrial appendage versus warfarin therapy for prevention of stroke in patients with atrial fibrillation: a randomised non-inferiority trial. Lancet 2009; 374: 534-42. GLENWOOD Jess BEARD, Doshi SK, Jonita VEAR Satchel D et al. on behalf of the PROTECT AF Investigators. Percutaneous Left Atrial Appendage Closure for Stroke Prophylaxis in Patients With Atrial Fibrillation 2.3-Year Follow-up of the PROTECT AF (Watchman Left Atrial Appendage System for Embolic Protection in Patients With Atrial Fibrillation) Trial. Circulation 2013; 127:720-729. - Alli O, Doshi S,  Kar S, Reddy VY, Sievert H et al. Quality of Life Assessment in the Randomized PROTECT AF (Percutaneous Closure of the Left Atrial Appendage Versus Warfarin Therapy for Prevention of Stroke  in Patients With Atrial Fibrillation) Trial of Patients at Risk for Stroke With Nonvalvular Atrial Fibrillation. J Am Coll Cardiol 2013; 61:1790-8. GLENWOOD Satchel DR, Archer RAMAN, Price M, Whisenant B, Sievert H, Doshi S, Huber K, Reddy V. Prospective randomized evaluation of the Watchman left atrial appendage Device in patients with atrial fibrillation versus long-term warfarin therapy; the PREVAIL trial. Journal of the [employer] of Cardiology, Vol. 4, No. 1, 2014, 1-11. - Kar S, Doshi SK, Sadhu A, Horton R, Osorio J et al. Primary outcome evaluation of a next-generation left atrial appendage closure device: results from the PINNACLE FLX trial. Circulation 2021;143(18)1754-1762.    After today's visit with the patient which was dedicated solely for shared decision making visit regarding LAA closure device, the patient decided to proceed with the LAA appendage closure procedure scheduled to be done in the near future at [facility]. Prior to the procedure, I would like to obtain a gated CT scan of the chest with contrast timed for PV/LA visualization.   Additionally, the patient will need ***.  HAS-BLED score *** Hypertension {YES/NO:21197} Abnormal renal and liver function (Dialysis, transplant, Cr >2.26 mg/dL /Cirrhosis or Bilirubin >2x Normal or AST/ALT/AP >3x Normal) {YES/NO:21197} Stroke {  YES/NO:21197} Bleeding {YES/NO:21197} Labile INR (Unstable/high INR) {YES/NO:21197} Elderly (>65) {YES/NO:21197} Drugs or alcohol (>= 8 drinks/week, anti-plt or NSAID) {YES/NO:21197}  CHA2DS2-VASc Score = 5  The patient's score is based upon: CHF History: 0 HTN History: 1 Diabetes History: 0 Stroke History: 2 Vascular Disease History: 0 Age Score: 2 Gender Score: 0   {Confirm score is correct.  If not, click here to update score.  REFRESH note.  :1}    ASSESSMENT AND PLAN: {Select the correct AFib Diagnosis                 :[id number]}   Signed,  Fonda Kitty, MD    02/25/2024 11:20 PM     Assessment & Plan       Follow up with {EPMDS:28135::EP Team} {EPFOLLOW LE:71826}  Signed, Fonda Kitty, MD

## 2024-02-26 ENCOUNTER — Ambulatory Visit: Payer: Medicare (Managed Care) | Admitting: Cardiology

## 2024-03-03 NOTE — Progress Notes (Signed)
 Electrophysiology Office Note:   Date:  03/04/2024  ID:  Tristan Gardner, DOB September 12, 1934, MRN [id number]  Primary Cardiologist: Tristan JINNY Lawrence, MD Electrophysiologist: Tristan Kitty, MD      History of Present Illness:   Tristan Gardner is a 89 y.o. 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 Lawrence.  Discussed the use of AI scribe software for clinical note transcription with the patient, who gave verbal consent to proceed.  History of Present Illness Tristan Gardner is an 89 year old male with atrial fibrillation who presents for evaluation of blood loss and mobility issues.  He generally feels well daily without chest pain but occasionally experiences shortness of breath depending on activity level. No significant fluid retention or swelling in his legs, although his ankles hurt and swell when painful.  He has been unable to walk for the past two to three months due to numbness and pain in his legs, extending from his feet to his knees. He relies on a wheelchair for mobility and cannot stand or walk independently.  He has a history of blood loss requiring transfusions. He reports prior GI bleeding but denies any currently.  His social support includes assistance from his grandson and his grandson's wife, who help him at home.   Review of systems complete and found to be negative unless listed in HPI.   EP Information / Studies Reviewed:    EKG is ordered today. Personal review as below.  EKG Interpretation Date/Time:  Tuesday March 04 2024 15:13:54 EDT Ventricular Rate:  55 PR Interval:    QRS Duration:  96 QT Interval:  414 QTC Calculation: 396 R Axis:   -21  Text Interpretation: Atrial flutter Incomplete right bundle branch block Cannot rule out Anterior infarct , age undetermined When compared with ECG of 01-Oct-2023  19:06, Atrial flutter has replaced atrial fibrillation Confirmed by Gardner Tristan [phone]) on 03/04/2024 3:16:48 PM   Nuclear Stress 05/2023:    The study is normal. The study is low risk.   No ST deviation was noted.   Left ventricular function is normal. End diastolic cavity size is normal.   Prior study not available for comparison.  Echo 12/2022:  1. Left ventricular ejection fraction, by estimation, is 50 to 55%. The  left ventricle has low normal function. The left ventricle has no regional  wall motion abnormalities. There is moderate concentric left ventricular  hypertrophy. Left ventricular  diastolic function could not be evaluated.   2. Right ventricular systolic function was not well visualized. The right  ventricular size is not well visualized. There is normal pulmonary artery  systolic pressure.   3. Left atrial size was mild to moderately dilated.   4. The mitral valve is normal in structure. Mild mitral valve  regurgitation. No evidence of mitral stenosis.   5. The aortic valve is tricuspid. There is mild calcification of the  aortic valve. There is mild thickening of the aortic valve. Aortic valve  regurgitation is not visualized.   6. Aortic dilatation noted. There is mild dilatation of the ascending  aorta, measuring 40 mm.   7. The inferior vena cava is normal in size with greater than 50%  respiratory variability, suggesting right atrial pressure of 3 mmHg.   Risk Assessment/Calculations:    CHA2DS2-VASc Score = 5   This indicates a 7.2% annual risk of stroke. The  patient's score is based upon: CHF History: 0 HTN History: 1 Diabetes History: 0 Stroke History: 2 Vascular Disease History: 0 Age Score: 2 Gender Score: 0             Physical Exam:   VS:  BP (!) 94/57   Pulse (!) 55   Ht 5' 11.5 (1.816 m)   Wt 246 lb (111.6 kg)   SpO2 97%   BMI 33.83 kg/m    Wt Readings from Last 3 Encounters:  03/04/24 246 lb (111.6 kg)  01/31/24 262 lb 1.6 oz  (118.9 kg)  01/25/24 240 lb (108.9 kg)     GEN: Well nourished, well developed in no acute distress NECK: No JVD CARDIAC: Bradycardic, irregular RESPIRATORY:  Clear to auscultation without rales, wheezing or rhonchi  ABDOMEN: Soft, non-distended EXTREMITIES:  No edema; No deformity   ASSESSMENT AND PLAN:    I have seen Tristan Gardner in the office today who is being considered for a Watchman left atrial appendage closure device. I believe they will benefit from this procedure given their history of atrial fibrillation, CHA2DS2-VASc score of 5 and unadjusted ischemic stroke rate of 7.2% per year. Unfortunately, the patient is not felt to be a long term anticoagulation candidate secondary to chronic anemia requiring blood transfusions, h/o GI bleeding. The patient's chart has been reviewed and I feel that they would be a candidate for short term oral anticoagulation after Watchman implant.   It is my belief that after undergoing a LAA closure procedure, Tristan Gardner will not need long term anticoagulation which eliminates anticoagulation side effects and major bleeding risk.   Procedural risks for the Watchman implant have been reviewed with the patient including a 0.5% risk of stroke, <1% risk of perforation and <1% risk of device embolization. Other risks include bleeding, vascular damage, tamponade, worsening renal function, and death. The patient understands these risk and wishes to proceed.     The published clinical data on the safety and effectiveness of WATCHMAN include but are not limited to the following: - Holmes DR, Jess BEARD, Sick P et al. for the PROTECT AF Investigators. Percutaneous closure of the left atrial appendage versus warfarin therapy for prevention of stroke in patients with atrial fibrillation: a randomised non-inferiority trial. Lancet 2009; 374: 534-42. GLENWOOD Jess BEARD, Doshi SK, Jonita VEAR Satchel D et al. on behalf of the PROTECT AF Investigators. Percutaneous Left Atrial  Appendage Closure for Stroke Prophylaxis in Patients With Atrial Fibrillation 2.3-Year Follow-up of the PROTECT AF (Watchman Left Atrial Appendage System for Embolic Protection in Patients With Atrial Fibrillation) Trial. Circulation 2013; 127:720-729. - Alli O, Doshi S,  Kar S, Reddy VY, Sievert H et al. Quality of Life Assessment in the Randomized PROTECT AF (Percutaneous Closure of the Left Atrial Appendage Versus Warfarin Therapy for Prevention of Stroke in Patients With Atrial Fibrillation) Trial of Patients at Risk for Stroke With Nonvalvular Atrial Fibrillation. J Am Coll Cardiol 2013; 61:1790-8. GLENWOOD Satchel DR, Archer RAMAN, Price M, Whisenant B, Sievert H, Doshi S, Huber K, Reddy V. Prospective randomized evaluation of the Watchman left atrial appendage Device in patients with atrial fibrillation versus long-term warfarin therapy; the PREVAIL trial. Journal of the [employer] of Cardiology, Vol. 4, No. 1, 2014, 1-11. - Kar S, Doshi SK, Sadhu A, Horton R, Osorio J et al. Primary outcome evaluation of a next-generation left atrial appendage closure device: results from the PINNACLE FLX trial. Circulation 2021;143(18)1754-1762.   HAS-BLED score 3 Hypertension Yes  Abnormal renal  and liver function (Dialysis, transplant, Cr >2.26 mg/dL /Cirrhosis or Bilirubin >2x Normal or AST/ALT/AP >3x Normal) No  Stroke No  Bleeding Yes  Labile INR (Unstable/high INR) No  Elderly (>65) Yes  Drugs or alcohol (>= 8 drinks/week, anti-plt or NSAID) No   CHA2DS2-VASc Score = 5  The patient's score is based upon: CHF History: 0 HTN History: 1 Diabetes History: 0 Stroke History: 2 Vascular Disease History: 0 Age Score: 2 Gender Score: 0       ASSESSMENT AND PLAN:  Today's visit was dedicated solely for shared decision making visit regarding LAA closure device.  The patient said he would be interested in the procedure if it would make him better.  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  Oncology is recommending home hospice care as recently as his office visit on 01/31/2024.  He is wheelchair-bound at baseline.  Watchman will not help him feel any better.  He states that he is taking Xarelto  without any bleeding.  I will reach out to his family who are not with him today and discuss overall goals of care.  Would tentatively not pursue watchman at this time unless conversation with family differs significantly.    Permanent Atrial Fibrillation/Atrial Flutter: He does not appear to have any cardiac awareness of his arrhythmia.  Rate controlled. -Continue metoprolol  XL 50 mg daily.  Secondary Hypercoagulable State: The patient is at significant risk for stroke/thromboembolism based upon his CHA2DS2-VASc Score of 5.  Continue Rivaroxaban  (Xarelto ).    Follow up with EP Team as needed.   Signed, Tristan Kitty, MD

## 2024-03-04 ENCOUNTER — Other Ambulatory Visit: Payer: Self-pay

## 2024-03-04 ENCOUNTER — Ambulatory Visit: Payer: Medicare (Managed Care) | Attending: Cardiology | Admitting: Cardiology

## 2024-03-04 ENCOUNTER — Encounter: Payer: Self-pay | Admitting: Cardiology

## 2024-03-04 VITALS — BP 94/57 | HR 55 | Ht 71.5 in | Wt 246.0 lb

## 2024-03-04 DIAGNOSIS — D6869 Other thrombophilia: Secondary | ICD-10-CM | POA: Diagnosis not present

## 2024-03-04 DIAGNOSIS — I4892 Unspecified atrial flutter: Secondary | ICD-10-CM | POA: Diagnosis not present

## 2024-03-04 DIAGNOSIS — D649 Anemia, unspecified: Secondary | ICD-10-CM

## 2024-03-04 DIAGNOSIS — I484 Atypical atrial flutter: Secondary | ICD-10-CM

## 2024-03-04 DIAGNOSIS — I4821 Permanent atrial fibrillation: Secondary | ICD-10-CM

## 2024-03-04 DIAGNOSIS — K922 Gastrointestinal hemorrhage, unspecified: Secondary | ICD-10-CM

## 2024-03-04 DIAGNOSIS — R0789 Other chest pain: Secondary | ICD-10-CM

## 2024-03-04 NOTE — Patient Instructions (Signed)
 Medication Instructions:  Your physician recommends that you continue on your current medications as directed. Please refer to the Current Medication list given to you today.  *If you need a refill on your cardiac medications before your next appointment, please call your pharmacy*  Lab Work: TODAY:  BMET  Testing/Procedures: Cardiac CT Your physician has requested that you have cardiac CT. Cardiac computed tomography (CT) is a painless test that uses an x-ray machine to take clear, detailed pictures of your heart. For further information please visit [email or web address]  Please follow instruction sheet as given. You will be called to schedule this test.   Watchman  Your physician has requested that you have Left atrial appendage (LAA) closure device implantation is a procedure to put a small device in the LAA of the heart. The LAA is a small sac in the wall of the heart's left upper chamber. Blood clots can form in this area. The device, Watchman closes the LAA to help prevent a blood clot and stroke.  You will be contacted by Nurse Navigator, Larkin Plumb to schedule your pre-procedure visit and procedure date. If you have any questions she can be reached at [phone].   Follow-Up: At [facility], you and your health needs are our priority.  As part of our continuing mission to provide you with exceptional heart care, our providers are all part of one team.  This team includes your primary Cardiologist (physician) and Advanced Practice Providers or APPs (Physician Assistants and Nurse Practitioners) who all work together to provide you with the care you need, when you need it.

## 2024-03-05 LAB — BASIC METABOLIC PANEL WITH GFR
BUN/Creatinine Ratio: 11 (ref 10–24)
BUN: 11 mg/dL (ref 8–27)
CO2: 21 mmol/L (ref 20–29)
Calcium: 9 mg/dL (ref 8.6–10.2)
Chloride: 97 mmol/L (ref 96–106)
Creatinine, Ser: 0.99 mg/dL (ref 0.76–1.27)
Glucose: 84 mg/dL (ref 70–99)
Potassium: 4.9 mmol/L (ref 3.5–5.2)
Sodium: 132 mmol/L — ABNORMAL LOW (ref 134–144)
eGFR: 74 mL/min/1.73 (ref >59)

## 2024-03-09 ENCOUNTER — Ambulatory Visit: Payer: Self-pay | Admitting: Cardiology

## 2024-04-01 ENCOUNTER — Ambulatory Visit

## 2024-04-01 ENCOUNTER — Other Ambulatory Visit

## 2024-04-09 ENCOUNTER — Ambulatory Visit (HOSPITAL_COMMUNITY)
Admission: RE | Admit: 2024-04-09 | Discharge: 2024-04-09 | Disposition: A | Discharge status: Home / Self Care | Payer: Medicare (Managed Care) | Source: Ambulatory Visit | Attending: Cardiology | Admitting: Cardiology

## 2024-04-09 DIAGNOSIS — I484 Atypical atrial flutter: Secondary | ICD-10-CM | POA: Insufficient documentation

## 2024-04-09 LAB — ECHOCARDIOGRAM COMPLETE: S' Lateral: 3.23 cm

## 2024-04-14 ENCOUNTER — Ambulatory Visit: Payer: Self-pay | Admitting: Cardiology

## 2024-04-14 NOTE — Progress Notes (Signed)
 Normal pumping function of the heart. No severe heart valve abnormalities noted.    Thanks MJP

## 2024-04-15 NOTE — Progress Notes (Signed)
 Letter mailed to patient.

## 2024-05-14 ENCOUNTER — Other Ambulatory Visit: Payer: Self-pay | Admitting: Family Medicine

## 2024-05-14 ENCOUNTER — Ambulatory Visit
Admission: RE | Admit: 2024-05-14 | Discharge: 2024-05-14 | Disposition: A | Discharge status: Home / Self Care | Payer: Medicare (Managed Care) | Source: Ambulatory Visit | Attending: Family Medicine | Admitting: Family Medicine

## 2024-05-14 DIAGNOSIS — R051 Acute cough: Secondary | ICD-10-CM

## 2024-06-16 ENCOUNTER — Other Ambulatory Visit: Payer: Self-pay | Admitting: Family Medicine

## 2024-06-16 ENCOUNTER — Ambulatory Visit
Admission: RE | Admit: 2024-06-16 | Discharge: 2024-06-16 | Disposition: A | Payer: Medicare (Managed Care) | Source: Ambulatory Visit | Attending: Family Medicine | Admitting: Family Medicine

## 2024-06-16 DIAGNOSIS — M25511 Pain in right shoulder: Secondary | ICD-10-CM

## 2024-07-10 ENCOUNTER — Encounter: Payer: Self-pay | Admitting: Primary Care

## 2024-07-10 ENCOUNTER — Ambulatory Visit: Payer: Medicare (Managed Care) | Admitting: Primary Care

## 2024-07-10 VITALS — BP 102/65 | HR 54 | Temp 97.6°F | Ht 71.5 in | Wt 246.0 lb

## 2024-07-10 DIAGNOSIS — Z0000 Encounter for general adult medical examination without abnormal findings: Secondary | ICD-10-CM

## 2024-07-10 DIAGNOSIS — I4891 Unspecified atrial fibrillation: Secondary | ICD-10-CM | POA: Diagnosis not present

## 2024-07-10 DIAGNOSIS — R351 Nocturia: Secondary | ICD-10-CM

## 2024-07-10 DIAGNOSIS — Z23 Encounter for immunization: Secondary | ICD-10-CM

## 2024-07-10 DIAGNOSIS — G4719 Other hypersomnia: Secondary | ICD-10-CM

## 2024-07-10 NOTE — Progress Notes (Signed)
 "  @Patient  ID: Tristan Gardner, male    DOB: May 27, 1935, 89 y.o.   MRN: [id number]  No chief complaint on file.   Referring provider: Cloria Annabella CROME, DO  HPI: 89 year old male, former smoker. PMH significant for afib, HTN, GI bleed, iron  deficiency anemia, dyslipidemia, obesity, prediabetes.   07/10/2024 Discussed the use of AI scribe software for clinical note transcription with the patient, who gave verbal consent to proceed.  History of Present Illness Tristan Gardner is an 89 year old male with atrial fibrillation who presents for a sleep consult. He was referred by Annabella Cloria for evaluation of potential sleep apnea.  He experiences nocturia, waking up approximately three times per night to use the restroom despite having a catheter. His sleep schedule includes going to bed around 9 PM, falling asleep after watching TV for about an hour, and waking up at 6 AM. He does not have trouble falling asleep and does not typically nap during the day, although he sometimes falls asleep in a chair while waiting for his aide.  He has atrial fibrillation and is prediabetic, with a BMI of 33. He does not have any known issues with snoring, nor has he woken up gasping or choking. He sleeps on his back due to his condition and uses a hospital bed to elevate his head.  He does not drive or consume alcohol. No concern for narcolepsy or cataplexy.    Allergies[1]  Immunization History  Administered Date(s) Administered   Janssen (J&J) SARS-COV-2 Vaccination 10/09/2019   Pneumococcal Polysaccharide-23 01/12/2020    Past Medical History:  Diagnosis Date   Atrial fibrillation (HCC)    Coronary artery disease    Diabetes mellitus without complication (HCC)    Hypertension    NSTEMI (non-ST elevated myocardial infarction) (HCC)    Stroke (HCC)     Tobacco History: Tobacco Use History[2] Counseling given: Not Answered   Outpatient Medications Prior to Visit  Medication Sig Dispense Refill    acetaminophen  (TYLENOL ) 325 MG tablet Take 2 tablets (650 mg total) by mouth every 6 (six) hours as needed for mild pain (or Fever >/= 101).     albuterol  (VENTOLIN  HFA) 108 (90 Base) MCG/ACT inhaler Inhale 2 puffs into the lungs every 4 (four) hours as needed for wheezing or shortness of breath.     atorvastatin  (LIPITOR) 40 MG tablet Take 40 mg by mouth at bedtime.     baclofen  (LIORESAL ) 10 MG tablet Take 15 mg by mouth 3 (three) times daily.     cyanocobalamin  (VITAMIN B12) 1000 MCG tablet Take 1,000 mcg by mouth daily.     diclofenac  Sodium (VOLTAREN ) 1 % GEL Apply 4 g topically 4 (four) times daily.     DULoxetine  (CYMBALTA ) 30 MG capsule Take 30 mg by mouth in the morning.     ferrous sulfate 325 (65 FE) MG tablet Take 325 mg by mouth daily with breakfast.     folic acid  (FOLVITE ) 1 MG tablet Take 1 mg by mouth daily.     gabapentin  (NEURONTIN ) 300 MG capsule Take 300 mg by mouth 3 (three) times daily.     metoprolol  succinate (TOPROL -XL) 50 MG 24 hr tablet Take 50 mg by mouth daily.     pantoprazole  (PROTONIX ) 40 MG tablet Take 1 tablet (40 mg total) by mouth 2 (two) times daily before a meal. 120 tablet 0   XARELTO  20 MG TABS tablet Take 1 tablet (20 mg total) by mouth every morning. START ON 7/10 (  Patient taking differently: Take 20 mg by mouth daily with supper.) 30 tablet    No facility-administered medications prior to visit.   Review of Systems  Review of Systems  Constitutional: Negative.   HENT: Negative.    Respiratory: Negative.    Cardiovascular: Negative.    Physical Exam  There were no vitals taken for this visit. Physical Exam Constitutional:      Appearance: Normal appearance. He is well-developed.  HENT:     Head: Normocephalic and atraumatic.     Mouth/Throat:     Mouth: Mucous membranes are moist.     Pharynx: Oropharynx is clear.  Cardiovascular:     Rate and Rhythm: Normal rate and regular rhythm.     Heart sounds: Normal heart sounds.  Pulmonary:      Effort: Pulmonary effort is normal. No respiratory distress.     Breath sounds: Normal breath sounds. No wheezing or rhonchi.  Musculoskeletal:        General: Normal range of motion.     Cervical back: Normal range of motion and neck supple.  Skin:    General: Skin is warm and dry.     Findings: No erythema or rash.  Neurological:     General: No focal deficit present.     Mental Status: He is alert and oriented to person, place, and time. Mental status is at baseline.  Psychiatric:        Mood and Affect: Mood normal.        Behavior: Behavior normal.        Thought Content: Thought content normal.        Judgment: Judgment normal.     Lab Results:  CBC    Component Value Date/Time   WBC 2.8 (L) 01/25/2024 0708   RBC 3.53 (L) 01/25/2024 0708   HGB 8.7 (L) 01/25/2024 0708   HGB 9.6 (L) 12/31/2023 1548   HCT 29.7 (L) 01/25/2024 0708   PLT 224 01/25/2024 0708   PLT 282 12/31/2023 1548   MCV 84.1 01/25/2024 0708   MCH 24.6 (L) 01/25/2024 0708   MCHC 29.3 (L) 01/25/2024 0708   RDW 18.8 (H) 01/25/2024 0708   LYMPHSABS 0.8 01/25/2024 0708   MONOABS 0.4 01/25/2024 0708   EOSABS 0.1 01/25/2024 0708   BASOSABS 0.0 01/25/2024 0708    BMET    Component Value Date/Time   NA 132 (L) 03/04/2024 1542   K 4.9 03/04/2024 1542   CL 97 03/04/2024 1542   CO2 21 03/04/2024 1542   GLUCOSE 84 03/04/2024 1542   GLUCOSE 105 (H) 12/31/2023 1548   BUN 11 03/04/2024 1542   CREATININE 0.99 03/04/2024 1542   CREATININE 0.98 12/31/2023 1548   CALCIUM  9.0 03/04/2024 1542   GFRNONAA >60 12/31/2023 1548   GFRAA >60 03/12/2020 0422    BNP    Component Value Date/Time   BNP 196.7 (H) 06/11/2023 1318    ProBNP No results found for: PROBNP  Imaging: DG Shoulder Right Result Date: 06/18/2024 EXAM: 1 VIEW(S) XRAY OF THE RIGHT SHOULDER 06/16/2024 02:17:50 PM COMPARISON: 03/02/2023 CLINICAL HISTORY: Right Shoulder Pain FINDINGS: BONES AND JOINTS: Glenohumeral joint is normally  aligned. Mild glenohumeral joint space narrowing. No acute fracture. No malalignment. Moderate hypertrophic changes of the acromioclavicular joint. Narrowing of the acromial humeral interval, suggesting underlying rotator cuff defect. SOFT TISSUES: No abnormal calcifications. Visualized lung is unremarkable. IMPRESSION: 1. Narrowing of the acromial humeral interval, suggesting underlying rotator cuff defect. 2. Degenerative changes without acute abnormality. Electronically signed  by: Oneil Devonshire MD 06/18/2024 03:05 AM EST RP Workstation: [id number]     Assessment & Plan:   1. Routine health maintenance (Primary) - Pneumococcal conjugate vaccine 20-valent (Prevnar 20)  2. Excessive daytime sleepiness - Home sleep test; Future  3. Atrial fibrillation, unspecified type (HCC)   Assessment & Plan Excessive daytime sleepiness  Suspected obstructive sleep apnea due to atrial fibrillation, BMI over 30, and prediabetes. No significant symptoms such as snoring, gasping, or choking during sleep. Patient does wake up several times during the night to use the restroom.  - Ordered home sleep study to assess for obstructive sleep apnea. - If sleep study results indicate moderate or severe sleep apnea, will discuss CPAP therapy. - If sleep study results indicate mild sleep apnea and he is asymptomatic, will monitor without intervention. - Advised sleeping with head elevated to reduce risk of airway obstruction.  Health maintenance  Due for influenza and pneumococcal vaccinations. Discussed benefits of vaccination and explained potential side effects    Tristan LELON Ferrari, NP 07/10/2024     [1]  Allergies Allergen Reactions   Iodine Hives, Itching and Rash         Shellfish Allergy Hives   Warfarin Other (See Comments)    Multiple GIB     Multiple GIB  [2]  Social History Tobacco Use  Smoking Status Former  Smokeless Tobacco Never   "

## 2024-07-10 NOTE — Patient Instructions (Addendum)
" °  VISIT SUMMARY: Today, you were seen for a sleep consultation to evaluate potential sleep apnea. You were referred by Annabella Rase. We discussed your current sleep patterns, nocturia, and your medical history, including atrial fibrillation and prediabetes. We also reviewed your immunization status.  YOUR PLAN: -OBSTRUCTIVE SLEEP APNEA (SUSPECTED): Obstructive sleep apnea is a condition where the airway becomes blocked during sleep, causing breathing to stop and start repeatedly. Given your atrial fibrillation, BMI over 30, and prediabetes, a home sleep study has been ordered to assess for this condition. If the results indicate moderate or severe sleep apnea, we will discuss CPAP therapy. If the results indicate mild sleep apnea and you are asymptomatic, we will monitor without intervention. You are advised to sleep with your head elevated to reduce the risk of airway obstruction.  -IMMUNIZATION: INFLUENZA AND PNEUMOCOCCAL VACCINATION: You are due for influenza and pneumococcal vaccinations, which help reduce the severity of symptoms if you get an infection. Today, you received the influenza vaccine. Potential side effects include soreness at the injection site, mild fever, and flu-like symptoms. Seek medical attention if symptoms worsen or do not improve.  INSTRUCTIONS: A home sleep study has been ordered to assess for obstructive sleep apnea. Please follow the instructions provided for the sleep study and schedule a follow-up appointment to discuss the results.      "

## 2024-07-14 ENCOUNTER — Ambulatory Visit: Payer: Medicare (Managed Care)

## 2024-07-14 ENCOUNTER — Ambulatory Visit (INDEPENDENT_AMBULATORY_CARE_PROVIDER_SITE_OTHER): Payer: Medicare (Managed Care) | Admitting: Podiatry

## 2024-07-14 DIAGNOSIS — B351 Tinea unguium: Secondary | ICD-10-CM

## 2024-07-14 DIAGNOSIS — M25572 Pain in left ankle and joints of left foot: Secondary | ICD-10-CM

## 2024-07-14 DIAGNOSIS — Z7901 Long term (current) use of anticoagulants: Secondary | ICD-10-CM | POA: Diagnosis not present

## 2024-07-14 DIAGNOSIS — M25571 Pain in right ankle and joints of right foot: Secondary | ICD-10-CM | POA: Diagnosis not present

## 2024-07-14 DIAGNOSIS — R609 Edema, unspecified: Secondary | ICD-10-CM

## 2024-07-14 NOTE — Progress Notes (Unsigned)
 Subjective:   Patient ID: Tristan Gardner, male   DOB: 89 y.o.   MRN: [id number]   HPI Chief Complaint  Patient presents with   RFC    Non diabetic patient presents today for B/L nail trim and RFC   89 year old male presents the office with above concerns.  States the nails are thick and long and he has difficulty trimming them himself may cause discomfort.  No open lesions.  No other concerns today.  Does have some chronic swelling to bilateral lower extremities.  He has been getting discomfort to the right ankle he is not sure why.  He does not walk.  He presents today in a wheelchair with a caregiver from [place].  He states the ankle started as long as he has been at [employer].   He is on Xarelto   Review of Systems  All other systems reviewed and are negative.  Past Medical History:  Diagnosis Date   Atrial fibrillation (HCC)    Coronary artery disease    Diabetes mellitus without complication (HCC)    Hypertension    NSTEMI (non-ST elevated myocardial infarction) (HCC)    Stroke [facility])     Past Surgical History:  Procedure Laterality Date   BIOPSY  10/23/2022   Procedure: BIOPSY;  Surgeon: Eda Iha, MD;  Location: [facility] ENDOSCOPY;  Service: Gastroenterology;;   CARDIAC SURGERY     COLONOSCOPY WITH PROPOFOL  N/A 10/23/2022   Procedure: COLONOSCOPY WITH PROPOFOL ;  Surgeon: Eda Iha, MD;  Location: [facility] ENDOSCOPY;  Service: Gastroenterology;  Laterality: N/A;   ENTEROSCOPY N/A 02/27/2023   Procedure: ENTEROSCOPY;  Surgeon: Kriss Estefana DEL, DO;  Location: [facility] ENDOSCOPY;  Service: Gastroenterology;  Laterality: N/A;   ESOPHAGOGASTRODUODENOSCOPY (EGD) WITH PROPOFOL  N/A 10/23/2022   Procedure: ESOPHAGOGASTRODUODENOSCOPY (EGD) WITH PROPOFOL ;  Surgeon: Eda Iha, MD;  Location: [facility] ENDOSCOPY;  Service: Gastroenterology;  Laterality: N/A;   HEMOSTASIS CLIP PLACEMENT  02/27/2023   Procedure: HEMOSTASIS CLIP PLACEMENT;  Surgeon: Kriss Estefana DEL, DO;  Location: [facility] ENDOSCOPY;   Service: Gastroenterology;;   IR INJECT/THERA/INC NEEDLE/CATH/PLC EPI/LUMB/SAC W/IMG  12/27/2022   LUMBAR LAMINECTOMY/DECOMPRESSION MICRODISCECTOMY N/A 01/03/2023   Procedure: Lumbar Two-Three, Lumbar Three-Four, Lumbar Four-Five Open laminectomies;  Surgeon: Cheryle Debby LABOR, MD;  Location: [facility] OR;  Service: Neurosurgery;  Laterality: N/A;   RADIOLOGY WITH ANESTHESIA N/A 12/27/2022   Procedure: TOTAL SPINE MYELOGRAM;  Surgeon: Radiologist, Medication, MD;  Location: [facility] OR;  Service: Radiology;  Laterality: N/A;   ROTATOR CUFF REPAIR      Current Medications[1]  Allergies[2]        Objective:  Physical Exam  General: AAO x3, NAD  Dermatological: Nails are hypertrophic, dystrophic, brittle, discolored, elongated 10. No surrounding redness or drainage. Tenderness nails 1-5 bilaterally. No open lesions or pre-ulcerative lesions are identified today.   Vascular: Dorsalis Pedis artery and Posterior Tibial artery pedal pulses are 1/4 bilateral with immedate capillary fill time.  Chronic appearing edema present bilaterally.  There is no erythema or warmth.  The calf is supple and there is no erythema or warmth to the calf.  Neruologic: Decreased  Musculoskeletal: Mild diffuse discomfort to the right ankle but no specific area pinpoint tenderness.  No erythema or warmth.       Assessment:   89 year old male with symptomatic onychosis on Xarelto ; lower extremity edema, right ankle pain     Plan:  -Treatment options discussed including all alternatives, risks, and complications -Etiology of symptoms were discussed  Symptomatic onychomycosis currently on Xarelto  -Sharply debrided nails x 10 without  complications or bleeding  Swelling, bilateral - Unfortunately does not walk and he is in a wheelchair.  Discussed compression and try to keep the legs elevated  Ankle pain - I think his symptoms could be multifactorial.  No signs of gout and no recent injuries.  No signs of infection  today.  Old tibia fracture is noted.  This could be result of swelling recommend compression.     Donnice JONELLE Fees DPM     [1]  Current Outpatient Medications:    acetaminophen  (TYLENOL ) 325 MG tablet, Take 2 tablets (650 mg total) by mouth every 6 (six) hours as needed for mild pain (or Fever >/= 101)., Disp: , Rfl:    albuterol  (VENTOLIN  HFA) 108 (90 Base) MCG/ACT inhaler, Inhale 2 puffs into the lungs every 4 (four) hours as needed for wheezing or shortness of breath., Disp: , Rfl:    atorvastatin  (LIPITOR) 40 MG tablet, Take 40 mg by mouth at bedtime., Disp: , Rfl:    baclofen  (LIORESAL ) 10 MG tablet, Take 15 mg by mouth 3 (three) times daily., Disp: , Rfl:    cyanocobalamin  (VITAMIN B12) 1000 MCG tablet, Take 1,000 mcg by mouth daily., Disp: , Rfl:    diclofenac  Sodium (VOLTAREN ) 1 % GEL, Apply 4 g topically 4 (four) times daily., Disp: , Rfl:    DULoxetine  (CYMBALTA ) 30 MG capsule, Take 30 mg by mouth in the morning., Disp: , Rfl:    ferrous sulfate 325 (65 FE) MG tablet, Take 325 mg by mouth daily with breakfast., Disp: , Rfl:    folic acid  (FOLVITE ) 1 MG tablet, Take 1 mg by mouth daily., Disp: , Rfl:    gabapentin  (NEURONTIN ) 300 MG capsule, Take 300 mg by mouth 3 (three) times daily., Disp: , Rfl:    metoprolol  succinate (TOPROL -XL) 50 MG 24 hr tablet, Take 50 mg by mouth daily., Disp: , Rfl:    pantoprazole  (PROTONIX ) 40 MG tablet, Take 1 tablet (40 mg total) by mouth 2 (two) times daily before a meal., Disp: 120 tablet, Rfl: 0   XARELTO  20 MG TABS tablet, Take 1 tablet (20 mg total) by mouth every morning. START ON 7/10 (Patient taking differently: Take 20 mg by mouth daily with supper.), Disp: 30 tablet, Rfl:  [2]  Allergies Allergen Reactions   Iodine Hives, Itching and Rash         Shellfish Allergy Hives   Warfarin Other (See Comments)    Multiple GIB     Multiple GIB  Multiple GIB     Multiple GIB    Multiple GIB   Multiple GIB

## 2024-10-13 ENCOUNTER — Ambulatory Visit: Payer: Medicare (Managed Care) | Admitting: Podiatry
